# Patient Record
Sex: Male | Born: 1950 | Race: Black or African American | Hispanic: No | Marital: Married | Smoking: Never smoker
Health system: Southern US, Community
[De-identification: ages and names within clinical notes are randomized; demographics above are authoritative.]

## PROBLEM LIST (undated history)

## (undated) DIAGNOSIS — Z952 Presence of prosthetic heart valve: Secondary | ICD-10-CM

## (undated) DIAGNOSIS — D689 Coagulation defect, unspecified: Secondary | ICD-10-CM

## (undated) DIAGNOSIS — E785 Hyperlipidemia, unspecified: Secondary | ICD-10-CM

## (undated) DIAGNOSIS — J449 Chronic obstructive pulmonary disease, unspecified: Secondary | ICD-10-CM

## (undated) DIAGNOSIS — J45909 Unspecified asthma, uncomplicated: Secondary | ICD-10-CM

## (undated) DIAGNOSIS — T7840XA Allergy, unspecified, initial encounter: Secondary | ICD-10-CM

## (undated) DIAGNOSIS — I10 Essential (primary) hypertension: Secondary | ICD-10-CM

## (undated) DIAGNOSIS — F419 Anxiety disorder, unspecified: Secondary | ICD-10-CM

## (undated) DIAGNOSIS — I509 Heart failure, unspecified: Secondary | ICD-10-CM

## (undated) DIAGNOSIS — R011 Cardiac murmur, unspecified: Secondary | ICD-10-CM

## (undated) HISTORY — PX: TOOTH EXTRACTION: SUR596

## (undated) HISTORY — DX: Anxiety disorder, unspecified: F41.9

## (undated) HISTORY — DX: Coagulation defect, unspecified: D68.9

## (undated) HISTORY — DX: Cardiac murmur, unspecified: R01.1

## (undated) HISTORY — DX: Chronic obstructive pulmonary disease, unspecified: J44.9

## (undated) HISTORY — PX: ACNE CYST REMOVAL: SUR1112

## (undated) HISTORY — PX: FRACTURE SURGERY: SHX138

## (undated) HISTORY — DX: Allergy, unspecified, initial encounter: T78.40XA

## (undated) HISTORY — DX: Heart failure, unspecified: I50.9

## (undated) HISTORY — DX: Hyperlipidemia, unspecified: E78.5

## (undated) HISTORY — PX: CARDIAC VALVE REPLACEMENT: SHX585

## (undated) HISTORY — PX: OTHER SURGICAL HISTORY: SHX169

---

## 1996-03-06 HISTORY — PX: VALVE REPLACEMENT: SUR13

## 1997-06-02 ENCOUNTER — Encounter (HOSPITAL_COMMUNITY): Admission: RE | Admit: 1997-06-02 | Discharge: 1997-08-31 | Payer: Self-pay | Admitting: *Deleted

## 1997-09-08 ENCOUNTER — Encounter (HOSPITAL_COMMUNITY): Admission: RE | Admit: 1997-09-08 | Discharge: 1997-12-07 | Payer: Self-pay | Admitting: *Deleted

## 1998-02-11 ENCOUNTER — Encounter: Payer: Self-pay | Admitting: Emergency Medicine

## 1998-02-13 ENCOUNTER — Inpatient Hospital Stay (HOSPITAL_COMMUNITY): Admission: EM | Admit: 1998-02-13 | Discharge: 1998-02-15 | Payer: Self-pay | Admitting: Emergency Medicine

## 1998-03-18 ENCOUNTER — Ambulatory Visit (HOSPITAL_COMMUNITY): Admission: RE | Admit: 1998-03-18 | Discharge: 1998-03-18 | Payer: Self-pay | Admitting: *Deleted

## 1998-03-18 ENCOUNTER — Encounter: Payer: Self-pay | Admitting: Cardiology

## 1998-03-24 ENCOUNTER — Emergency Department (HOSPITAL_COMMUNITY): Admission: EM | Admit: 1998-03-24 | Discharge: 1998-03-24 | Payer: Self-pay | Admitting: Emergency Medicine

## 1998-03-26 ENCOUNTER — Ambulatory Visit: Admission: RE | Admit: 1998-03-26 | Discharge: 1998-03-26 | Payer: Self-pay | Admitting: *Deleted

## 1998-04-20 ENCOUNTER — Encounter (HOSPITAL_COMMUNITY): Admission: RE | Admit: 1998-04-20 | Discharge: 1998-07-19 | Payer: Self-pay | Admitting: *Deleted

## 1998-04-23 ENCOUNTER — Encounter: Admission: RE | Admit: 1998-04-23 | Discharge: 1998-07-22 | Payer: Self-pay | Admitting: *Deleted

## 1998-07-19 ENCOUNTER — Encounter (HOSPITAL_COMMUNITY): Admission: RE | Admit: 1998-07-19 | Discharge: 1998-10-17 | Payer: Self-pay | Admitting: *Deleted

## 1999-09-13 ENCOUNTER — Ambulatory Visit (HOSPITAL_COMMUNITY): Admission: RE | Admit: 1999-09-13 | Discharge: 1999-09-13 | Payer: Self-pay | Admitting: Internal Medicine

## 1999-09-13 ENCOUNTER — Encounter: Payer: Self-pay | Admitting: Internal Medicine

## 2003-02-27 ENCOUNTER — Emergency Department (HOSPITAL_COMMUNITY): Admission: EM | Admit: 2003-02-27 | Discharge: 2003-02-27 | Payer: Self-pay | Admitting: Emergency Medicine

## 2003-03-12 ENCOUNTER — Emergency Department (HOSPITAL_COMMUNITY): Admission: EM | Admit: 2003-03-12 | Discharge: 2003-03-13 | Payer: Self-pay | Admitting: Emergency Medicine

## 2004-01-19 ENCOUNTER — Ambulatory Visit: Payer: Self-pay | Admitting: *Deleted

## 2004-03-29 ENCOUNTER — Ambulatory Visit: Payer: Self-pay

## 2004-04-29 ENCOUNTER — Ambulatory Visit: Payer: Self-pay | Admitting: Cardiology

## 2004-05-12 ENCOUNTER — Ambulatory Visit: Payer: Self-pay

## 2004-05-12 ENCOUNTER — Ambulatory Visit: Payer: Self-pay | Admitting: Cardiology

## 2004-07-26 ENCOUNTER — Ambulatory Visit: Payer: Self-pay | Admitting: Internal Medicine

## 2004-09-09 ENCOUNTER — Ambulatory Visit: Payer: Self-pay | Admitting: Cardiology

## 2004-10-06 ENCOUNTER — Ambulatory Visit: Payer: Self-pay | Admitting: Internal Medicine

## 2004-11-03 ENCOUNTER — Ambulatory Visit: Payer: Self-pay | Admitting: Internal Medicine

## 2004-12-15 ENCOUNTER — Ambulatory Visit: Payer: Self-pay | Admitting: Cardiology

## 2004-12-22 ENCOUNTER — Ambulatory Visit: Payer: Self-pay | Admitting: Cardiology

## 2005-02-28 ENCOUNTER — Ambulatory Visit: Payer: Self-pay | Admitting: Cardiology

## 2005-03-16 ENCOUNTER — Ambulatory Visit: Payer: Self-pay | Admitting: Internal Medicine

## 2005-03-20 ENCOUNTER — Ambulatory Visit: Payer: Self-pay | Admitting: Internal Medicine

## 2005-04-13 ENCOUNTER — Ambulatory Visit: Payer: Self-pay | Admitting: *Deleted

## 2005-07-05 ENCOUNTER — Ambulatory Visit: Payer: Self-pay | Admitting: Cardiology

## 2005-07-17 ENCOUNTER — Ambulatory Visit: Payer: Self-pay | Admitting: Cardiology

## 2005-07-26 ENCOUNTER — Ambulatory Visit: Payer: Self-pay | Admitting: Cardiology

## 2005-09-05 ENCOUNTER — Ambulatory Visit: Payer: Self-pay | Admitting: Internal Medicine

## 2005-11-20 ENCOUNTER — Ambulatory Visit: Payer: Self-pay | Admitting: Cardiology

## 2006-01-31 ENCOUNTER — Ambulatory Visit: Payer: Self-pay | Admitting: Cardiovascular Disease

## 2006-03-14 ENCOUNTER — Ambulatory Visit: Payer: Self-pay | Admitting: Cardiology

## 2006-08-28 ENCOUNTER — Ambulatory Visit: Payer: Self-pay | Admitting: Cardiology

## 2006-08-29 ENCOUNTER — Ambulatory Visit: Payer: Self-pay | Admitting: Cardiology

## 2006-10-31 ENCOUNTER — Ambulatory Visit: Payer: Self-pay | Admitting: Internal Medicine

## 2006-11-12 ENCOUNTER — Ambulatory Visit: Payer: Self-pay | Admitting: Cardiology

## 2007-01-22 ENCOUNTER — Ambulatory Visit: Payer: Self-pay | Admitting: Cardiology

## 2007-03-14 ENCOUNTER — Telehealth: Payer: Self-pay | Admitting: Internal Medicine

## 2007-03-19 ENCOUNTER — Ambulatory Visit: Payer: Self-pay | Admitting: Cardiovascular Disease

## 2007-04-02 ENCOUNTER — Ambulatory Visit: Payer: Self-pay | Admitting: Cardiovascular Disease

## 2007-04-30 ENCOUNTER — Ambulatory Visit: Payer: Self-pay | Admitting: Cardiology

## 2007-05-28 ENCOUNTER — Ambulatory Visit: Payer: Self-pay | Admitting: Cardiovascular Disease

## 2007-06-03 DIAGNOSIS — J45909 Unspecified asthma, uncomplicated: Secondary | ICD-10-CM | POA: Insufficient documentation

## 2007-06-03 DIAGNOSIS — Z9889 Other specified postprocedural states: Secondary | ICD-10-CM

## 2007-06-03 DIAGNOSIS — I10 Essential (primary) hypertension: Secondary | ICD-10-CM | POA: Insufficient documentation

## 2007-06-03 DIAGNOSIS — T7840XA Allergy, unspecified, initial encounter: Secondary | ICD-10-CM | POA: Insufficient documentation

## 2007-06-03 DIAGNOSIS — E785 Hyperlipidemia, unspecified: Secondary | ICD-10-CM | POA: Insufficient documentation

## 2007-06-03 DIAGNOSIS — F411 Generalized anxiety disorder: Secondary | ICD-10-CM | POA: Insufficient documentation

## 2007-06-03 DIAGNOSIS — Z8669 Personal history of other diseases of the nervous system and sense organs: Secondary | ICD-10-CM | POA: Insufficient documentation

## 2007-06-25 ENCOUNTER — Ambulatory Visit: Payer: Self-pay | Admitting: Cardiology

## 2007-10-28 ENCOUNTER — Ambulatory Visit: Payer: Self-pay | Admitting: Internal Medicine

## 2008-02-20 ENCOUNTER — Ambulatory Visit: Payer: Self-pay | Admitting: Cardiology

## 2008-05-18 ENCOUNTER — Ambulatory Visit: Payer: Self-pay | Admitting: Cardiovascular Disease

## 2008-06-18 ENCOUNTER — Ambulatory Visit: Payer: Self-pay | Admitting: Internal Medicine

## 2008-06-25 ENCOUNTER — Ambulatory Visit: Payer: Self-pay | Admitting: Internal Medicine

## 2008-07-08 ENCOUNTER — Ambulatory Visit: Payer: Self-pay | Admitting: Cardiovascular Disease

## 2008-07-20 ENCOUNTER — Encounter: Payer: Self-pay | Admitting: Cardiology

## 2008-07-22 ENCOUNTER — Ambulatory Visit: Payer: Self-pay | Admitting: Cardiology

## 2008-07-29 ENCOUNTER — Ambulatory Visit: Payer: Self-pay | Admitting: Internal Medicine

## 2008-08-04 ENCOUNTER — Encounter: Payer: Self-pay | Admitting: *Deleted

## 2008-08-12 ENCOUNTER — Ambulatory Visit: Payer: Self-pay | Admitting: Cardiology

## 2008-08-12 LAB — CONVERTED CEMR LAB
POC INR: 2.1
Protime: 17.8

## 2008-08-19 ENCOUNTER — Ambulatory Visit: Payer: Self-pay | Admitting: Cardiology

## 2008-08-19 ENCOUNTER — Encounter: Payer: Self-pay | Admitting: Cardiology

## 2008-08-26 ENCOUNTER — Ambulatory Visit: Payer: Self-pay | Admitting: Internal Medicine

## 2008-08-26 LAB — CONVERTED CEMR LAB
POC INR: 1.9
Prothrombin Time: 17.1 s

## 2008-09-09 ENCOUNTER — Ambulatory Visit: Payer: Self-pay | Admitting: Internal Medicine

## 2008-09-09 ENCOUNTER — Encounter: Payer: Self-pay | Admitting: *Deleted

## 2008-09-09 LAB — CONVERTED CEMR LAB
POC INR: 3.6
Prothrombin Time: 22.9 s

## 2008-09-30 ENCOUNTER — Ambulatory Visit: Payer: Self-pay | Admitting: Cardiology

## 2008-09-30 LAB — CONVERTED CEMR LAB
POC INR: 1.8
Prothrombin Time: 16.5 s

## 2008-10-14 ENCOUNTER — Ambulatory Visit: Payer: Self-pay | Admitting: Internal Medicine

## 2008-11-11 ENCOUNTER — Ambulatory Visit: Payer: Self-pay | Admitting: Cardiology

## 2008-11-11 LAB — CONVERTED CEMR LAB: POC INR: 3.5

## 2008-12-02 ENCOUNTER — Ambulatory Visit: Payer: Self-pay | Admitting: Cardiovascular Disease

## 2008-12-11 ENCOUNTER — Telehealth: Payer: Self-pay | Admitting: Cardiology

## 2008-12-30 ENCOUNTER — Ambulatory Visit: Payer: Self-pay | Admitting: Cardiology

## 2009-02-05 ENCOUNTER — Encounter (INDEPENDENT_AMBULATORY_CARE_PROVIDER_SITE_OTHER): Payer: Self-pay | Admitting: Cardiology

## 2009-02-05 ENCOUNTER — Ambulatory Visit: Payer: Self-pay | Admitting: Cardiovascular Disease

## 2009-02-05 LAB — CONVERTED CEMR LAB: POC INR: 3.3

## 2009-03-24 ENCOUNTER — Ambulatory Visit: Payer: Self-pay | Admitting: Cardiovascular Disease

## 2009-03-24 LAB — CONVERTED CEMR LAB: POC INR: 2.1

## 2009-04-22 ENCOUNTER — Ambulatory Visit: Payer: Self-pay | Admitting: Cardiology

## 2009-04-22 LAB — CONVERTED CEMR LAB: POC INR: 2.4

## 2009-05-20 ENCOUNTER — Ambulatory Visit: Payer: Self-pay | Admitting: Internal Medicine

## 2009-05-20 LAB — CONVERTED CEMR LAB: POC INR: 3.4

## 2009-06-24 ENCOUNTER — Ambulatory Visit: Payer: Self-pay | Admitting: Cardiology

## 2009-06-24 LAB — CONVERTED CEMR LAB: POC INR: 2.6

## 2009-07-22 ENCOUNTER — Ambulatory Visit: Payer: Self-pay | Admitting: Internal Medicine

## 2009-07-22 LAB — CONVERTED CEMR LAB: POC INR: 2.5

## 2009-07-30 ENCOUNTER — Telehealth: Payer: Self-pay | Admitting: Cardiology

## 2009-10-08 ENCOUNTER — Ambulatory Visit: Payer: Self-pay | Admitting: Cardiovascular Disease

## 2009-11-02 ENCOUNTER — Telehealth: Payer: Self-pay | Admitting: Cardiology

## 2009-11-26 ENCOUNTER — Encounter (INDEPENDENT_AMBULATORY_CARE_PROVIDER_SITE_OTHER): Payer: Self-pay | Admitting: *Deleted

## 2009-12-02 ENCOUNTER — Ambulatory Visit: Payer: Self-pay | Admitting: Cardiology

## 2010-02-04 ENCOUNTER — Ambulatory Visit: Payer: Self-pay | Admitting: Cardiovascular Disease

## 2010-02-11 ENCOUNTER — Ambulatory Visit: Payer: Self-pay | Admitting: Cardiology

## 2010-02-11 ENCOUNTER — Encounter: Payer: Self-pay | Admitting: Cardiology

## 2010-02-11 DIAGNOSIS — R634 Abnormal weight loss: Secondary | ICD-10-CM | POA: Insufficient documentation

## 2010-02-21 ENCOUNTER — Ambulatory Visit: Payer: Self-pay

## 2010-02-25 ENCOUNTER — Ambulatory Visit: Payer: Self-pay | Admitting: Cardiology

## 2010-02-25 ENCOUNTER — Ambulatory Visit (HOSPITAL_COMMUNITY)
Admission: RE | Admit: 2010-02-25 | Discharge: 2010-02-25 | Payer: Self-pay | Source: Home / Self Care | Attending: Cardiology | Admitting: Cardiology

## 2010-02-25 ENCOUNTER — Encounter: Payer: Self-pay | Admitting: Cardiology

## 2010-03-15 ENCOUNTER — Ambulatory Visit: Admit: 2010-03-15 | Payer: Self-pay | Admitting: Cardiology

## 2010-03-18 ENCOUNTER — Ambulatory Visit: Admit: 2010-03-18 | Payer: Self-pay

## 2010-04-05 NOTE — Medication Information (Signed)
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 no    Recent/future dental: no  Any missed doses?: no       Is patient compliant with meds? yes       Current Medications (verified): 1)  Lisinopril-Hydrochlorothiazide 20-12.5 Mg Tabs (Lisinopril-Hydrochlorothiazide) .... One Tablet By Mouth Once Daily 2)  Pravachol 40 Mg Tabs (Pravastatin Sodium) .... One Tablet By Mouth At Bedtime 3)  Metoprolol Tartrate 50 Mg Tabs (Metoprolol Tartrate) .... Take 1 Tablet By Mouth Two Times A Day 4)  Warfarin Sodium 5 Mg Tabs (Warfarin Sodium) .... Take As Directed By Coumadin Clinic 5)  Diltiazem Hcl Er Beads 240 Mg Xr24h-Cap (Diltiazem Hcl Er Beads) .... Take One Capsule By Mouth Daily 6)  Aspirin 81 Mg Tbec (Aspirin) .... Take One Tablet By Mouth Daily 7)  Multivitamins  Tabs (Multiple Vitamin) .... Take 1 Tablet By Mouth Once A Day 8)  Advair Diskus 100-50 Mcg/dose Aepb (Fluticasone-Salmeterol) .... Inhale 1 Puff in The Morning and Evening.  Allergies (verified): 1)  ! Altace (Ramipril) 2)  ! Propranolol Hcl (Propranolol Hcl) 3)  ! Codeine Sulfate (Codeine Sulfate) 4)  ! Vytorin (Ezetimibe-Simvastatin) 5)  ! Coreg (Carvedilol)  Anticoagulation Management History:      The patient is taking warfarin and comes in today for a routine follow up visit.  Negative risk factors for bleeding include an age less than 8 years old.  The bleeding index is 'low risk'.  Positive CHADS2 values include History of HTN.  Negative CHADS2 values include Age > 61  years old.  The start date was 03/06/1997.  Anticoagulation responsible provider: Ladona Ridgel MD, Sharlot Gowda.  INR POC: 2.5.  Exp: 08/2010.    Anticoagulation Management Assessment/Plan:      The patient's current anticoagulation dose is Warfarin sodium 5 mg tabs: take as directed by coumadin clinic.  The target INR is 2.5-3.5.  The next INR is due 08/27/2009.  Anticoagulation instructions were given to patient.  Results were reviewed/authorized by Eda Keys, PharmD.  He was notified by Eda Keys, PharmD.         Prior Anticoagulation Instructions: INR 2.6 Continue 10mg  daily except 7.5mg s on Sundays. Recheck in 4 weeks.   Current Anticoagulation Instructions: INR 2.5  Take 2.5 tablets today, then return to normal dosing schedule of 1.5 tablets on Sunday and 2 tablets all other days.  Return to clinic in 4 weeks.

## 2010-04-05 NOTE — Letter (Signed)
Summary: Appointment - Missed  [facility], Main Office  [address]   [place], [place] [address]   Phone: [phone]  Fax: [phone]     November 26, 2009 MRN: [id number]   [facility] [address] [place], [place]  [address]   Dear [name],  Our records indicate you missed your appointment on 11-09-2009 with  Dr.  Jens Som .It is very important that we reach you to reschedule this appointment. We look forward to participating in your health care needs. Please contact us at the number listed above at your earliest convenience to reschedule this appointment.     Sincerely,      Lorne Skeens  [facility] Scheduling Team

## 2010-04-05 NOTE — Progress Notes (Signed)
  Phone Note Refill Request Message from:  Patient on November 02, 2009 10:04 AM  Refills Requested: Medication #1:  WARFARIN SODIUM 5 MG TABS take as directed by coumadin clinic [facility]   Method Requested: Telephone to Pharmacy Initial call taken by: Glynda Jaeger,  November 02, 2009 10:04 AM    Prescriptions: WARFARIN SODIUM 5 MG TABS (WARFARIN SODIUM) take as directed by coumadin clinic  #60 x 1   Entered by:   Cloyde Reams RN   Authorized by:   Ferman Hamming, MD, [facility]   Signed by:   Cloyde Reams RN on 11/02/2009   Method used:   Electronically to        [facility] (retail)       [address]       [place], [place]  [address]       Ph: [phone]       Fax: [phone]   RxID:   [id number]

## 2010-04-05 NOTE — Medication Information (Signed)
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 yes       Details: was on abx and steroids about 2 weeks ago for PNA  Recent/future dental: no  Any missed doses?: no       Is patient compliant with meds? yes       Allergies: 1)  ! Altace (Ramipril) 2)  ! Propranolol Hcl (Propranolol Hcl) 3)  ! Codeine Sulfate (Codeine Sulfate) 4)  ! Vytorin (Ezetimibe-Simvastatin) 5)  ! Coreg (Carvedilol)  Anticoagulation Management History:      The patient is taking warfarin and comes in today for a routine follow up visit.  Negative risk factors for bleeding include an age less than 24 years old.  The bleeding index is 'low risk'.  Positive CHADS2 values include History of HTN.  Negative CHADS2 values include Age > 78 years old.  The start date was 03/06/1997.  Anticoagulation responsible provider: Excell Seltzer MD, Casimiro Needle.  INR POC: 2.4.  Cuvette Lot#: [id number].  Exp: 02/2011.    Anticoagulation Management Assessment/Plan:      The patient's current anticoagulation dose is Warfarin sodium 5 mg tabs: take as directed by coumadin clinic.  The target INR is 2.5-3.5.  The next INR is due 02/25/2010.  Anticoagulation instructions were given to patient.  Results were reviewed/authorized by Weston Brass, PharmD.  He was notified by Weston Brass PharmD.         Prior Anticoagulation Instructions: INR 2.3  Coumadin 10mg  = 2 tabs each day  Current Anticoagulation Instructions: INR 2.4  Take 2 1/2 tablets today then resume same dose of 2 tablets every day.  Recheck INR in 3  weeks.

## 2010-04-05 NOTE — Medication Information (Signed)
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 no    Recent/future dental: no  Any missed doses?: no       Is patient compliant with meds? yes       Allergies: 1)  ! Altace (Ramipril) 2)  ! Propranolol Hcl (Propranolol Hcl) 3)  ! Codeine Sulfate (Codeine Sulfate) 4)  ! Vytorin (Ezetimibe-Simvastatin) 5)  ! Coreg (Carvedilol)  Anticoagulation Management History:      The patient is taking warfarin and comes in today for a routine follow up visit.  Negative risk factors for bleeding include an age less than 94 years old.  The bleeding index is 'low risk'.  Positive CHADS2 values include History of HTN.  Negative CHADS2 values include Age > 22 years old.  The start date was 03/06/1997.  Anticoagulation responsible provider: Eden Emms MD, Theron Arista.  INR POC: 2.1.  Cuvette Lot#: [id number].  Exp: 06/2010.    Anticoagulation Management Assessment/Plan:      The patient's current anticoagulation dose is Warfarin sodium 5 mg tabs: take as directed by coumadin clinic.  The target INR is 2.5-3.5.  The next INR is due 04/14/2009.  Anticoagulation instructions were given to patient.  Results were reviewed/authorized by Bethena Midget, RN, BSN.  He was notified by Bethena Midget, RN, BSN.         Prior Anticoagulation Instructions: INR 3.3  Continue 1.5 tabs on Sundays and Thursdays. Continue 2 tabs on all other days.   Recheck in 4 weeks.   Current Anticoagulation Instructions: INR 2.1 Today take 15mg s then continue 10mg s daily except 7.5mg s on Sundays and  Thursdays. Recheck in 3 weeks.

## 2010-04-05 NOTE — Medication Information (Signed)
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 no    Recent/future dental: no  Any missed doses?: no       Is patient compliant with meds? yes       Allergies: 1)  ! Altace (Ramipril) 2)  ! Propranolol Hcl (Propranolol Hcl) 3)  ! Codeine Sulfate (Codeine Sulfate) 4)  ! Vytorin (Ezetimibe-Simvastatin) 5)  ! Coreg (Carvedilol)  Anticoagulation Management History:      The patient is taking warfarin and comes in today for a routine follow up visit.  Negative risk factors for bleeding include an age less than 58 years old.  The bleeding index is 'low risk'.  Positive CHADS2 values include History of HTN.  Negative CHADS2 values include Age > 18 years old.  The start date was 03/06/1997.  Anticoagulation responsible provider: Myrtis Ser MD, Tinnie Gens.  INR POC: 2.4.  Cuvette Lot#: [id number].  Exp: 06/2010.    Anticoagulation Management Assessment/Plan:      The patient's current anticoagulation dose is Warfarin sodium 5 mg tabs: take as directed by coumadin clinic.  The target INR is 2.5-3.5.  The next INR is due 05/13/2009.  Anticoagulation instructions were given to patient.  Results were reviewed/authorized by Bethena Midget, RN, BSN.  He was notified by Bethena Midget, RN, BSN.         Prior Anticoagulation Instructions: INR 2.1 Today take 15mg s then continue 10mg s daily except 7.5mg s on Sundays and Thursdays. Recheck in 3 weeks.  Current Anticoagulation Instructions: INR 2.4 Today 10mg s then change dose to 10mg s daily except 7.5mg s on Sundays. Recheck in 3  weeks.

## 2010-04-05 NOTE — Medication Information (Signed)
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 yes       Details: PRN use of Advil Sinus, another suggested made by Pharm D  Recent/future dental: no  Any missed doses?: no       Is patient compliant with meds? yes       Allergies: 1)  ! Altace (Ramipril) 2)  ! Propranolol Hcl (Propranolol Hcl) 3)  ! Codeine Sulfate (Codeine Sulfate) 4)  ! Vytorin (Ezetimibe-Simvastatin) 5)  ! Coreg (Carvedilol)  Anticoagulation Management History:      The patient is taking warfarin and comes in today for a routine follow up visit.  Negative risk factors for bleeding include an age less than 48 years old.  The bleeding index is 'low risk'.  Positive CHADS2 values include History of HTN.  Negative CHADS2 values include Age > 56 years old.  The start date was 03/06/1997.  Anticoagulation responsible provider: Myrtis Ser MD, Tinnie Gens.  INR POC: 2.6.  Cuvette Lot#: [id number].  Exp: 08/2010.    Anticoagulation Management Assessment/Plan:      The patient's current anticoagulation dose is Warfarin sodium 5 mg tabs: take as directed by coumadin clinic.  The target INR is 2.5-3.5.  The next INR is due 07/22/2009.  Anticoagulation instructions were given to patient.  Results were reviewed/authorized by Bethena Midget, RN, BSN.  He was notified by Bethena Midget, RN, BSN.         Prior Anticoagulation Instructions: INR 3.4  Continue on same dosage 2 tablets daily except 1.5 tablets on Sundays.  Recheck in 4 weeks.    Current Anticoagulation Instructions: INR 2.6 Continue 10mg  daily except 7.5mgs on Sundays. Recheck in 4 weeks.  Prescriptions: WARFARIN SODIUM 5 MG TABS (WARFARIN SODIUM) take as directed by coumadin clinic  #60 x 3   Entered by:   Tiffany Muse, RN, BSN   Authorized by:   Brian Saunders Crenshaw, MD, FACC   Signed by:   Tiffany Muse, RN, BSN on 06/24/2009   Method used:   Electronically to        [facility] (retail)       [address]       [place], [place]  [address]       Ph: [phone]       Fax: [phone]   RxID:   [id number]

## 2010-04-05 NOTE — Medication Information (Signed)
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 no    Recent/future dental: no  Any missed doses?: no       Is patient compliant with meds? yes       Allergies: 1)  ! Altace (Ramipril) 2)  ! Propranolol Hcl (Propranolol Hcl) 3)  ! Codeine Sulfate (Codeine Sulfate) 4)  ! Vytorin (Ezetimibe-Simvastatin) 5)  ! Coreg (Carvedilol)  Anticoagulation Management History:      The patient is taking warfarin and comes in today for a routine follow up visit.  Negative risk factors for bleeding include an age less than 78 years old.  The bleeding index is 'low risk'.  Positive CHADS2 values include History of HTN.  Negative CHADS2 values include Age > 66 years old.  The start date was 03/06/1997.  Anticoagulation responsible provider: Eden Emms MD, Theron Arista.  INR POC: 2.1.  Cuvette Lot#: [id number].  Exp: 12/2010.    Anticoagulation Management Assessment/Plan:      The patient's current anticoagulation dose is Warfarin sodium 5 mg tabs: take as directed by coumadin clinic.  The target INR is 2.5-3.5.  The next INR is due 11/04/2009.  Anticoagulation instructions were given to patient.  Results were reviewed/authorized by Cloyde Reams, RN, BSN.  He was notified by Cloyde Reams RN.         Prior Anticoagulation Instructions: INR 2.5  Take 2.5 tablets today, then return to normal dosing schedule of 1.5 tablets on Sunday and 2 tablets all other days.  Return to clinic in 4 weeks.    Current Anticoagulation Instructions: INR 2.1  Take 3 tablets today, then resume  same dosage 2 tablets daily except 1.5 tablets on Sundays.  Recheck in 4 weeks.

## 2010-04-05 NOTE — Medication Information (Signed)
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 no    Recent/future dental: no  Any missed doses?: no       Is patient compliant with meds? yes       Allergies: 1)  ! Altace (Ramipril) 2)  ! Propranolol Hcl (Propranolol Hcl) 3)  ! Codeine Sulfate (Codeine Sulfate) 4)  ! Vytorin (Ezetimibe-Simvastatin) 5)  ! Coreg (Carvedilol)  Anticoagulation Management History:      The patient is taking warfarin and comes in today for a routine follow up visit.  Negative risk factors for bleeding include an age less than 65 years old.  The bleeding index is 'low risk'.  Positive CHADS2 values include History of HTN.  Negative CHADS2 values include Age > 24 years old.  The start date was 03/06/1997.  Anticoagulation responsible provider: Myrtis Ser MD, Tinnie Gens.  INR POC: 3.4.  Cuvette Lot#: [id number].  Exp: 07/2010.    Anticoagulation Management Assessment/Plan:      The patient's current anticoagulation dose is Warfarin sodium 5 mg tabs: take as directed by coumadin clinic.  The target INR is 2.5-3.5.  The next INR is due 06/17/2009.  Anticoagulation instructions were given to patient.  Results were reviewed/authorized by Cloyde Reams, RN, BSN.  He was notified by Cloyde Reams RN.         Prior Anticoagulation Instructions: INR 2.4 Today 10mg s then change dose to 10mg s daily except 7.5mg s on Sundays. Recheck in 3 weeks.   Current Anticoagulation Instructions: INR 3.4  Continue on same dosage 2 tablets daily except 1.5 tablets on Sundays.  Recheck in 4 weeks.

## 2010-04-05 NOTE — Progress Notes (Signed)
Summary: refill pt is out of medication  Phone Note Refill Request Message from:  Patient  Refills Requested: Medication #1:  ADVAIR DISKUS 100-50 MCG/DOSE AEPB Inhale 1 puff in the morning and evening.. send to [facility]   Initial call taken by: Judie Grieve,  Jul 30, 2009 3:11 PM  Follow-up for Phone Call        called pt. Pt didnot answer. Will call agian to let pt know to ask this primary care physican for this med Follow-up by: Kem Parkinson,  August 04, 2009 10:03 AM

## 2010-04-05 NOTE — Medication Information (Signed)
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 no    Recent/future dental: no  Any missed doses?: no       Is patient compliant with meds? yes       Current Medications (verified): 1)  Lisinopril-Hydrochlorothiazide 20-12.5 Mg Tabs (Lisinopril-Hydrochlorothiazide) .... One Tablet By Mouth Once Daily 2)  Pravachol 40 Mg Tabs (Pravastatin Sodium) .... One Tablet By Mouth At Bedtime 3)  Metoprolol Tartrate 50 Mg Tabs (Metoprolol Tartrate) .... Take 1 Tablet By Mouth Two Times A Day 4)  Warfarin Sodium 5 Mg Tabs (Warfarin Sodium) .... Take As Directed By Coumadin Clinic 5)  Diltiazem Hcl Er Beads 240 Mg Xr24h-Cap (Diltiazem Hcl Er Beads) .... Take One Capsule By Mouth Daily 6)  Aspirin 81 Mg Tbec (Aspirin) .... Take One Tablet By Mouth Daily 7)  Multivitamins  Tabs (Multiple Vitamin) .... Take 1 Tablet By Mouth Once A Day 8)  Advair Diskus 100-50 Mcg/dose Aepb (Fluticasone-Salmeterol) .... Inhale 1 Puff in The Morning and Evening.  Allergies: 1)  ! Altace (Ramipril) 2)  ! Propranolol Hcl (Propranolol Hcl) 3)  ! Codeine Sulfate (Codeine Sulfate) 4)  ! Vytorin (Ezetimibe-Simvastatin) 5)  ! Coreg (Carvedilol)  Anticoagulation Management History:      Negative risk factors for bleeding include an age less than 70 years old.  The bleeding index is 'low risk'.  Positive CHADS2 values include History of HTN.  Negative CHADS2 values include Age > 29 years old.  The start date was 03/06/1997.  Anticoagulation responsible Hibba Schram: Shirlee Latch MD,  D[place].  INR POC: 2.3.  Exp: 12/2010.    Anticoagulation Management Assessment/Plan:      The patient's current anticoagulation dose is Warfarin sodium 5 mg tabs: take as directed by coumadin clinic.  The target INR is 2.5-3.5.  The next INR is due 12/30/2009.  Anticoagulation instructions were given to patient.  Results were reviewed/authorized by Leota Sauers, PharmD, BCPS, CPP.         Prior Anticoagulation Instructions: INR 2.1  Take 3 tablets today, then resume same dosage 2 tablets daily except 1.5 tablets on Sundays.  Recheck in 4 weeks.    Current Anticoagulation Instructions: INR 2.3  Coumadin 10mg = 2 tabs each day Prescriptions: WARFARIN SODIUM 5 MG TABS (WARFARIN SODIUM) take as directed by coumadin clinic  #90 x 1   Entered by:   Erika Johnson RN   Authorized by:   Thomas C Wall, MD, FACC   Signed by:   Erika Johnson RN on 12/02/2009   Method used:   Electronically to        [facility] (retail)       [address]       [place], [place]  [address]       Ph: [phone]       Fax: [phone]   RxID:   [id number]

## 2010-04-05 NOTE — Assessment & Plan Note (Signed)
Summary: rov/ gd   CC:  check up.  History of Present Illness: [name] is a very pleasant gentleman who is status post mitral valve replacement with a St. Jude valve in 1998. A pre-operative cardiac catheterization revealed normal coronary arteries. The last echocardiogram in the chart is from March of 2006 and the ejection fraction was 45-50% the mitral valve was well seated. The left atrium was mildly dilated. There is mild tricuspid regurgitation.I last saw him in June of 2010. Since I last saw him he denies any dyspnea on exertion, orthopnea, PND, pedal edema, palpitations or syncope. There is no chest pain. No bleeding.  Preventive Screening-Counseling & Management  Alcohol-Tobacco     Smoking Status: never  Current Medications (verified): 1)  Lisinopril-Hydrochlorothiazide 20-12.5 Mg Tabs (Lisinopril-Hydrochlorothiazide) .... One Tablet By Mouth Once Daily 2)  Metoprolol Tartrate 50 Mg Tabs (Metoprolol Tartrate) .... Take 1 Tablet By Mouth Two Times A Day 3)  Warfarin Sodium 5 Mg Tabs (Warfarin Sodium) .... Take As Directed By Coumadin Clinic 4)  Diltiazem Hcl Er Beads 240 Mg Xr24h-Cap (Diltiazem Hcl Er Beads) .... Take One Capsule By Mouth Daily 5)  Aspirin 81 Mg Tbec (Aspirin) .... Take One Tablet By Mouth Daily 6)  Multivitamins  Tabs (Multiple Vitamin) .... Take 1 Tablet By Mouth Once A Day 7)  Advair Diskus 100-50 Mcg/dose Aepb (Fluticasone-Salmeterol) .... Inhale 1 Puff in The Morning and Evening.  Allergies: 1)  ! Altace (Ramipril) 2)  ! Propranolol Hcl (Propranolol Hcl) 3)  ! Codeine Sulfate (Codeine Sulfate) 4)  ! Vytorin (Ezetimibe-Simvastatin) 5)  ! Coreg (Carvedilol)  Past History:  Past Medical History: LABYRINTHITIS, HX OF (ICD-V12.40) ASTHMA (ICD-493.90) ANXIETY (ICD-300.00) ALLERGY (ICD-995.3) MITRAL VALVE REPLACEMENT, HX OF (ICD-V15.1) DYSLIPIDEMIA (ICD-272.4) HYPERTENSION (ICD-401.9)  Past Surgical History: Jaw surgery Cyst removal from hand and  back Valve Replacement-Mitral (S/P)St. Jude in 1998  Social History: He is a Charity fundraiser. Married live with his wife , one son and one daugther Alcohol Use - no Drug Use - no Tobacco Use - No.  Smoking Status:  never  Review of Systems       Describes weight loss of 30 pounds but no fevers or chills, productive cough, hemoptysis, dysphasia, odynophagia, melena, hematochezia, dysuria, hematuria, rash, seizure activity, orthopnea, PND, pedal edema, claudication. Remaining systems are negative.   Vital Signs:  Patient profile:   60 year old male Height:      76 inches Weight:      183 pounds BMI:     22.36 Pulse rate:   73 / minute Resp:     16 per minute BP sitting:   132 / 80  (left arm)  Vitals Entered By: Kem Parkinson (February 11, 2010 12:36 PM)  Physical Exam  General:  Well-developed well-nourished in no acute distress.  Skin is warm and dry.  HEENT is normal.  Neck is supple. No thyromegaly.  Chest is clear to auscultation with normal expansion.  Cardiovascular exam is regular rate and rhythm. Crisp mechanical valve. Abdominal exam nontender or distended. No masses palpated. Extremities show no edema. neuro grossly intact    EKG  Procedure date:  02/11/2010  Findings:      Sinus rhythm with occasional PAC.  Impression & Recommendations:  Problem # 1:  COUMADIN THERAPY (ICD-V58.61) Monitored in the Coumadin clinic. Check CBC.  Problem # 2:  MITRAL VALVE REPLACEMENT, HX OF (ICD-V15.1) Continue SBE prophylaxis. Check echocardiogram. Orders: Echocardiogram (Echo)  Problem # 3:  DYSLIPIDEMIA (ICD-272.4)  Patient did not tolerate Pravachol. Begin generic Lipitor 20 mg p.o. daily and check lipids and liver in 4 weeks. The following medications were removed from the medication list:    Pravachol 40 Mg Tabs (Pravastatin sodium) ..... One tablet by mouth at bedtime His updated medication list for this problem includes:    Lipitor 20 Mg Tabs  (Atorvastatin calcium) .Marland Kitchen... 1 tab at bedtime  The following medications were removed from the medication list:    Pravachol 40 Mg Tabs (Pravastatin sodium) ..... One tablet by mouth at bedtime His updated medication list for this problem includes:    Lipitor 20 Mg Tabs (Atorvastatin calcium) .Marland Kitchen... 1 tab at bedtime  Problem # 4:  HYPERTENSION (ICD-401.9)  Blood pressure controlled. Continue present medications. Check potassium and renal function. His updated medication list for this problem includes:    Lisinopril-hydrochlorothiazide 20-12.5 Mg Tabs (Lisinopril-hydrochlorothiazide) ..... One tablet by mouth once daily    Metoprolol Tartrate 50 Mg Tabs (Metoprolol tartrate) .Marland Kitchen... Take 1 tablet by mouth two times a day    Diltiazem Hcl Er Beads 240 Mg Xr24h-cap (Diltiazem hcl er beads) .Marland Kitchen... Take one capsule by mouth daily    Aspirin 81 Mg Tbec (Aspirin) .Marland Kitchen... Take one tablet by mouth daily  His updated medication list for this problem includes:    Lisinopril-hydrochlorothiazide 20-12.5 Mg Tabs (Lisinopril-hydrochlorothiazide) ..... One tablet by mouth once daily    Metoprolol Tartrate 50 Mg Tabs (Metoprolol tartrate) .Marland Kitchen... Take 1 tablet by mouth two times a day    Diltiazem Hcl Er Beads 240 Mg Xr24h-cap (Diltiazem hcl er beads) .Marland Kitchen... Take one capsule by mouth daily    Aspirin 81 Mg Tbec (Aspirin) .Marland Kitchen... Take one tablet by mouth daily  Problem # 5:  WEIGHT LOSS (ICD-783.21) Patient feels this is most likely secondary to his diet. Check TSH, CBC. Followup with his primary care.  Patient Instructions: 1)  Your physician has recommended you make the following change in your medication: START ATORVASTATIN (LIPITOR) 20 MG 1 TABLET EVERY NIGHT. 2)  Your physician wants you to follow-up in:  1 YEAR WITH DR. CRENSHAW. You will receive a reminder letter in the mail two months in advance. If you don't receive a letter, please call our office to schedule the follow-up appointment. 3)  Your physician  has requested that you have an echocardiogram.  Echocardiography is a painless test that uses sound waves to create images of your heart. It provides your doctor with information about the size and shape of your heart and how well your heart's chambers and valves are working.  This procedure takes approximately one hour. There are no restrictions for this procedure. 4)  Your physician recommends that you return for lab work in: 4 WEEKS FOR LIVER/LIPID, BMET AND CBC Prescriptions: LIPITOR 20 MG TABS (ATORVASTATIN CALCIUM) 1 tab at bedtime  #30 x 11   Entered by:   Danielle Rankin, CMA   Authorized by:   Ferman Hamming, MD, [facility]   Signed by:   Danielle Rankin, CMA on 02/11/2010   Method used:   Electronically to        [facility] (retail)       [address]       [place], [place]  [address]       Ph: [phone]       Fax: [phone]   RxID:   [id number]

## 2010-04-07 NOTE — Medication Information (Signed)
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 yes       Details: Restarted Lipitor   Any missed doses?: no       Is patient compliant with meds? yes       Allergies: 1)  ! Altace (Ramipril) 2)  ! Propranolol Hcl (Propranolol Hcl) 3)  ! Codeine Sulfate (Codeine Sulfate) 4)  ! Vytorin (Ezetimibe-Simvastatin) 5)  ! Coreg (Carvedilol)  Anticoagulation Management History:      The patient is taking warfarin and comes in today for a routine follow up visit.  Negative risk factors for bleeding include an age less than 51 years old.  The bleeding index is 'low risk'.  Positive CHADS2 values include History of HTN.  Negative CHADS2 values include Age > 60 years old.  The start date was 03/06/1997.  Anticoagulation responsible provider: Excell Seltzer MD, Casimiro Needle.  INR POC: 2.2.  Cuvette Lot#: [id number].  Exp: 03/2011.    Anticoagulation Management Assessment/Plan:      The patient's current anticoagulation dose is Warfarin sodium 5 mg tabs: take as directed by coumadin clinic.  The target INR is 2.5-3.5.  The next INR is due 03/18/2010.  Anticoagulation instructions were given to patient.  Results were reviewed/authorized by Bethena Midget, RN, BSN.  He was notified by Bethena Midget, RN, BSN.         Prior Anticoagulation Instructions: INR 2.4  Take 2 1/2 tablets today then resume same dose of 2 tablets every day.  Recheck INR in 3 weeks.   Current Anticoagulation Instructions: INR 2.2 Today take 12.5mg s then change dose to 10mg s daily except 12.5mg s on Mondays. Reheck in 3 weeks.  Prescriptions: WARFARIN SODIUM 5 MG TABS (WARFARIN SODIUM) take as directed by coumadin  clinic  #90 x 3   Entered by:   Bethena Midget, RN, BSN   Authorized by:   Ferman Hamming, MD, [facility]   Signed by:   Bethena Midget, RN, BSN on 02/25/2010   Method used:   Electronically to        [facility] (retail)       [address]       [place], [place]  [address]       Ph: [phone]       Fax: [phone]   RxID:   [id number]

## 2010-04-14 DIAGNOSIS — I059 Rheumatic mitral valve disease, unspecified: Secondary | ICD-10-CM | POA: Insufficient documentation

## 2010-04-14 DIAGNOSIS — Z9889 Other specified postprocedural states: Secondary | ICD-10-CM

## 2010-05-31 ENCOUNTER — Other Ambulatory Visit: Payer: Self-pay | Admitting: Cardiology

## 2010-06-02 NOTE — Telephone Encounter (Signed)
[address] °

## 2010-07-08 ENCOUNTER — Ambulatory Visit (INDEPENDENT_AMBULATORY_CARE_PROVIDER_SITE_OTHER): Payer: Self-pay | Admitting: *Deleted

## 2010-07-08 ENCOUNTER — Other Ambulatory Visit: Payer: Self-pay | Admitting: *Deleted

## 2010-07-08 DIAGNOSIS — Z9889 Other specified postprocedural states: Secondary | ICD-10-CM

## 2010-07-08 DIAGNOSIS — I059 Rheumatic mitral valve disease, unspecified: Secondary | ICD-10-CM

## 2010-07-08 LAB — POCT INR: INR: 3.1

## 2010-07-08 MED ORDER — DILTIAZEM HCL ER COATED BEADS 240 MG PO CP24: 240.0000 mg | ORAL_CAPSULE | Freq: Every day | ORAL | Status: DC

## 2010-07-19 NOTE — Assessment & Plan Note (Signed)
[facility]                            CARDIOLOGY OFFICE NOTE   OLUWASEMILORE, BAHL                      MRN:          [id number]  DATE:08/29/2006                            DOB:          13-Jul-1950    Corey Mathis is a very pleasant gentleman who is status post mitral valve  replacement with a St. Jude valve in 1998.  Since I last saw him, he  denies any dyspnea, chest pain, palpitations, or syncope.  There is no  pedal edema.  Note, he has run out of several of his medications in the  past week and has not taken them.   CURRENT MEDICATIONS:  1. Advair Diskus.  2. Multivitamin.  3. Cardizem 240 mg p.o. daily.  4. Coumadin as directed.  5. Aspirin 81 mg p.o. daily.  6. Diovan 320 mg p.o. daily.  7. Lopressor 25 mg p.o. b.i.d.  8. Lipitor 20 mg p.o. daily.  9. Hydrochlorothiazide 12.5 mg p.o. daily.  10.Fish oil.  11.Flax seed oil.  12.Vitamin C.   PHYSICAL EXAMINATION:  VITAL SIGNS:  Blood pressure 148/95, pulse 99.  He weighs 214 pounds.  HEENT:  Normal.  NECK:  Supple with no bruits.  CHEST:  Clear.  CARDIOVASCULAR:  Regular rate and rhythm.  There is a crisp mechanical  valve sound.  There is a soft 1/6 systolic murmur at the apex.  ABDOMEN:  Benign with no pulsatile masses or bruits.  EXTREMITIES:  No edema.   Electrocardiogram shows a normal sinus rhythm at a rate of 96.  The axis  is normal.  There are occasional PACs.  There are no ST-T changes noted.   DIAGNOSES:  1. Status post mitral valve replacement with a St. Jude valve in 1998.      We will continue with Coumadin with a goal international normalized      ratio of 2.5 to 3.5, and he will continue on his enteric-coated      aspirin 81 mg by mouth daily.  2. Hypertension.  His blood pressure is elevated today, but he has not      taken his Lopressor or Diovan recently.  We will resume those.      When he returns for fasting lipids and liver in 6 weeks, we will      check a  basic metabolic panel as well as a complete blood count      given his Coumadin use.  3. Coumadin.  He will continue to be followed in the Coumadin Clinic.  4. Hyperlipidemia.  We will resume his Lipitor, and we will check      lipids and liver in 6 weeks.  5. History of asthma.   He will continue with SBE prophylaxis, and we will see him back in 1  year.     Madolyn Frieze Jens Som, MD, [facility]  Electronically Signed    BSC/MedQ  DD: 08/29/2006  DT: 08/30/2006  Job #: [id number]

## 2010-07-19 NOTE — Assessment & Plan Note (Signed)
[facility]                         GASTROENTEROLOGY OFFICE NOTE   NAME:Corey Mathis, Corey Mathis                      MRN:          [id number]  DATE:10/31/2006                            DOB:          Oct 12, 1950    CHIEF COMPLAINT:  Screening colonoscopy, but he takes Coumadin.  He has  mitral valve replacement.   ASSESSMENT:  A 60 year old [place] American man with no gastrointestinal  symptoms.  However he needs screening colonoscopy.  Last screening exam  was a sigmoidoscopy in 2000 by Dr. Debby Bud.  He takes Coumadin for a  mitral valve.   PLAN:  After discussing the options of CT colonoscopy, heparinization,  Lovenox window and performing the procedure on Coumadin the patient has  elected to have a screening colonoscopy on Coumadin.  He understands I  will be able to remove small lesions but could not use cautery which  could require another colonoscopy within a year.  Risks, benefits and  indications including bleeding, infection, perforation, possible need  for surgery explained to the patient.   HISTORY:  As above.   MEDICATIONS:  1. Advair Diskus 100/50 two puffs daily.  2. Multivitamin daily.  3. Diltiazem 240 mg daily.  4. Coumadin as directed.  5. Enteric coated aspirin 81 mg daily.  6. Diovan 320 mg daily.  7. Metoprolol 25 mg twice daily.  8. Lipitor 20 mg daily.  9. HCTZ 12.5 mg daily.  10.Fish oil - omega 3 daily.  11.Flax seed oil daily.  12.Vitamin C daily.  13.Flonase p.r.n.   ALLERGIES:  ALTACE, PROPRANOLOL, CODEINE, VYTORIN, COREG.   PAST MEDICAL HISTORY:  1. Hypertension.  2. Dyslipidemia.  3. Mitral valve replacement 1998, followed by Dr. Jens Som at this      time.  4. Allergies.  5. Anxiety.  6. Asthma/reactive airways disease.  7. Jaw surgery.  8. Cyst removal from hand and neck.  9. History of labyrinthitis.   SOCIAL HISTORY/FAMILY HISTORY/REVIEW OF SYSTEMS:  He is a English as a second language teacher.  No alcohol, tobacco or  drugs.  One son, one daughter.  Has a  PhD.  Lives with his wife.   No colon cancer in the family.   PHYSICAL EXAMINATION:  Height 6 foot 4, weight 215 pounds, blood  pressure 126/72, pulse 76.  EYES:  Anicteric.  NECK:  Supple.  CHEST:  Clear.  HEART:  S1 and S2, there is a metallic click with S1 and a soft systolic  murmur at the apex 1/6.  ABDOMEN:  Soft, nontender, no organomegaly.  He is alert and oriented x3.   I appreciate the opportunity to care for this patient.     Iva Boop, MD,FACG  Electronically Signed    CEG/MedQ  DD: 10/31/2006  DT: 11/01/2006  Job #: [id number]   cc:   Rosalyn Gess. Norins, MD

## 2010-08-05 ENCOUNTER — Encounter: Payer: Self-pay | Admitting: *Deleted

## 2010-08-08 ENCOUNTER — Other Ambulatory Visit: Payer: Self-pay | Admitting: *Deleted

## 2010-08-08 ENCOUNTER — Ambulatory Visit (INDEPENDENT_AMBULATORY_CARE_PROVIDER_SITE_OTHER): Payer: Self-pay | Admitting: *Deleted

## 2010-08-08 DIAGNOSIS — Z9889 Other specified postprocedural states: Secondary | ICD-10-CM

## 2010-08-08 DIAGNOSIS — I059 Rheumatic mitral valve disease, unspecified: Secondary | ICD-10-CM

## 2010-08-08 MED ORDER — ATORVASTATIN CALCIUM 20 MG PO TABS: 20.0000 mg | ORAL_TABLET | Freq: Every day | ORAL | Status: DC

## 2010-08-26 ENCOUNTER — Ambulatory Visit (INDEPENDENT_AMBULATORY_CARE_PROVIDER_SITE_OTHER): Payer: Self-pay | Admitting: Family Medicine

## 2010-08-26 DIAGNOSIS — I10 Essential (primary) hypertension: Secondary | ICD-10-CM

## 2010-08-26 DIAGNOSIS — J45909 Unspecified asthma, uncomplicated: Secondary | ICD-10-CM

## 2010-08-26 DIAGNOSIS — J329 Chronic sinusitis, unspecified: Secondary | ICD-10-CM

## 2010-08-26 DIAGNOSIS — J310 Chronic rhinitis: Secondary | ICD-10-CM

## 2010-08-26 MED ORDER — AMOXICILLIN-POT CLAVULANATE 875-125 MG PO TABS: 1.0000 | ORAL_TABLET | Freq: Two times a day (BID) | ORAL | Status: AC

## 2010-08-26 MED ORDER — ALBUTEROL SULFATE HFA 108 (90 BASE) MCG/ACT IN AERS: 2.0000 | INHALATION_SPRAY | Freq: Four times a day (QID) | RESPIRATORY_TRACT | Status: DC | PRN

## 2010-08-26 MED ORDER — FLUTICASONE PROPIONATE 50 MCG/ACT NA SUSP: 2.0000 | Freq: Every day | NASAL | Status: DC

## 2010-08-26 MED ORDER — BUDESONIDE-FORMOTEROL FUMARATE 160-4.5 MCG/ACT IN AERO: 2.0000 | INHALATION_SPRAY | Freq: Two times a day (BID) | RESPIRATORY_TRACT | Status: DC

## 2010-08-26 MED ORDER — PREDNISONE 20 MG PO TABS: ORAL_TABLET | ORAL | Status: DC

## 2010-08-26 NOTE — Assessment & Plan Note (Signed)
He is not well controlled today. I told him he did need to restart his lisinopril. He continues to have dry mouth even after limiting several of his over-the-counter agents that can cause dry mouth then we could consider discontinuing his diuretic. 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 We will recheck his blood pressure in 2 weeks.

## 2010-08-26 NOTE — Patient Instructions (Addendum)
Symbicort 2 puff twice a day.  Stop the 12 hour nasal spray and use the precription nasal spray.  Use your proair 3 x a day for about 5 days, then 2 x a day for 3 days, then 1 x a day for 3 days.

## 2010-08-26 NOTE — Assessment & Plan Note (Signed)
He is wheezing on exam today and was in the red zone based on his peak flows. We did give him a neb treatment with albuterol and Atrovent while he was here. He did feel a little better after the treatment but his peak flow improved only minimally. I gave him a sample of albuterol inhaler so that he can start using it 3 times a day. I also switched him from Advair to simple court. He was given a sample. I'm going to also start him on steroids as well. Patient is to followup in about 2 weeks with Dr. Cathey Endow to make sure that he is improving. If not he will need a chest x-ray.

## 2010-08-26 NOTE — Progress Notes (Signed)
  Subjective:    Patient ID: Corey Mathis, male    DOB: Aug 13, 1950, 60 y.o.   MRN: [id number]  HPI  Constant nasal drainage and congestion.Low energy.  No fever.  Has had constant drianage and congestion for several years.  Does get some relief with Advil for his sinus headaches. Tried flonase at one time and helped some.  He is also taking an antihistamine and decongestant. He also notices he has been wheezing over this last week. He says he has a rescue inhaler that used to be her friends but he does not have a prescription for his own. He says that the Advair seems to give him a cough and he wonders if there's something different he could use.  Mouth stays persistantly driv. He occ feels SOB with activities. Using Advair. There he also has been taking several different over-the-counter medications and just bleeding a decongestant, nasal Afrin, and a generic form of Allegra.  Says his didn take his lisinopril this today because he forget. Says when he takes it his BP is usually well controlled.   He was on amoxicillin prophylactically for a dental tooth extraction about a month ago.  He does have a history of mitral valve prolapse.  Review of Systems     Objective:   Physical Exam  Constitutional: He is oriented to person, place, and time. He appears well-developed and well-nourished.  HENT:  Head: Normocephalic and atraumatic.  Right Ear: External ear normal.  Left Ear: External ear normal.  Nose: Nose normal.  Mouth/Throat: Oropharynx is clear and moist.       TMs and canals are clear.   Eyes: Conjunctivae and EOM are normal. Pupils are equal, round, and reactive to light.  Neck: Neck supple. No thyromegaly present.  Cardiovascular: Normal rate and normal heart sounds.   Pulmonary/Chest: Effort normal. He has wheezes.  Lymphadenopathy:    He has no cervical adenopathy.  Neurological: He is alert and oriented to person, place, and time.  Skin: Skin is warm and dry.    Psychiatric: He has a normal mood and affect.          Assessment & Plan:  Dry mouth-no report is listed over-the-counter medications and marked for several that can cause dry mouth. I asked him to start with stopping these first. If he continues to have dry mouth we can consider stopping his diuretic as well. 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  Sinusitis/bronchitis-I will have him on Augmentin. We will also treat with a course of steroids. He also still has a baseline level of chronic nasal congestion. I like him to get back on a nasal steroid. Avoid over-the-counter nasal decongestants. He may even benefit from adding a nasal antihistamine. He may also benefit from allergy testing to see if there are some avoidance measures that would help reduce his frequency of symptoms. It is also possible that his rhinitis is not allergic related. They're typically nasal steroids will help this as well.

## 2010-08-29 MED ORDER — IPRATROPIUM BROMIDE 0.02 % IN SOLN
0.5000 mg | Freq: Once | RESPIRATORY_TRACT | Status: AC
  Administered 2010-08-29: 0.5 mg via RESPIRATORY_TRACT

## 2010-08-29 MED ORDER — ALBUTEROL SULFATE (2.5 MG/3ML) 0.083% IN NEBU
2.5000 mg | INHALATION_SOLUTION | Freq: Once | RESPIRATORY_TRACT | Status: AC
  Administered 2010-08-29: 2.5 mg via RESPIRATORY_TRACT

## 2010-09-05 ENCOUNTER — Encounter: Payer: Self-pay | Admitting: *Deleted

## 2010-09-08 ENCOUNTER — Ambulatory Visit (INDEPENDENT_AMBULATORY_CARE_PROVIDER_SITE_OTHER): Payer: PRIVATE HEALTH INSURANCE | Admitting: *Deleted

## 2010-09-08 DIAGNOSIS — I059 Rheumatic mitral valve disease, unspecified: Secondary | ICD-10-CM

## 2010-09-08 DIAGNOSIS — Z9889 Other specified postprocedural states: Secondary | ICD-10-CM

## 2010-09-08 MED ORDER — WARFARIN SODIUM 5 MG PO TABS: ORAL_TABLET | ORAL | Status: DC

## 2010-09-19 ENCOUNTER — Ambulatory Visit (INDEPENDENT_AMBULATORY_CARE_PROVIDER_SITE_OTHER): Payer: PRIVATE HEALTH INSURANCE | Admitting: Family Medicine

## 2010-09-19 ENCOUNTER — Encounter: Payer: Self-pay | Admitting: Family Medicine

## 2010-09-19 VITALS — BP 119/75 | HR 87 | Ht 76.0 in | Wt 181.0 lb

## 2010-09-19 DIAGNOSIS — I059 Rheumatic mitral valve disease, unspecified: Secondary | ICD-10-CM

## 2010-09-19 DIAGNOSIS — Z1211 Encounter for screening for malignant neoplasm of colon: Secondary | ICD-10-CM

## 2010-09-19 DIAGNOSIS — E785 Hyperlipidemia, unspecified: Secondary | ICD-10-CM

## 2010-09-19 DIAGNOSIS — J45909 Unspecified asthma, uncomplicated: Secondary | ICD-10-CM

## 2010-09-19 MED ORDER — BUDESONIDE-FORMOTEROL FUMARATE 160-4.5 MCG/ACT IN AERO: 2.0000 | INHALATION_SPRAY | Freq: Two times a day (BID) | RESPIRATORY_TRACT | Status: DC

## 2010-09-19 NOTE — Assessment & Plan Note (Signed)
I reviewed his labs I do not see a recent lipid panel from the last 12 months. He did say he was supposed to get blood work but hasn't gone yet. I encouraged him to do so.

## 2010-09-19 NOTE — Assessment & Plan Note (Signed)
He is much improved today. There is no wheezing on exam he is not having to use his inhaler at nighttime. I spent him that I would still like to try to wean down his rescue inhaler. Right now he starting on the higher strength of the Symbicort. I also recommended he start an antihistamine sometime in August since he does have difficulty with ragweed season. He is to continue that until November. Also continue the nasal steroid. At this point I see no indication that he has a sinus infection.

## 2010-09-19 NOTE — Patient Instructions (Addendum)
Check with our insurance to see if they cover a shingles (Zostavax)  vaccine and a Tdap (tetanus) vaccine.  Let us know if you want to start getting your coumadin checked here.

## 2010-09-19 NOTE — Assessment & Plan Note (Signed)
For his Coumadin, and I will be happy to manage that here in our office but he needs to discuss that first with the cardiology office where he is currently getting it checked.

## 2010-09-19 NOTE — Progress Notes (Signed)
  Subjective:    Patient ID: Corey Mathis, male    DOB: Jul 02, 1950, 60 y.o.   MRN: [id number]  HPI Asthma-much improved per patient. Still some drainage out of sinuses.  HE has not wheezing. Not using inhaler at night. Still using rescue inhaler still twice a day but feels so much better.  Still using his flonase.  Has been cleanign his house and thinks this has aggravated his sinuses as he has noticed some frontal facial pressure low-grade headache.  He would like to starting his Coumadin checked at our office is closer to his work and home.  He also notes he is overdue by a couple of years  for his colonoscopy. Last colonoscopy was done at [facility].  Review of Systems     Objective:   Physical Exam  Constitutional: He appears well-developed and well-nourished.  HENT:  Head: Normocephalic and atraumatic.  Cardiovascular: Normal rate, regular rhythm and normal heart sounds.   Pulmonary/Chest: Effort normal and breath sounds normal.  Neurological: He is alert.  Skin: Skin is warm and dry.  Psychiatric: He has a normal mood and affect.          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

## 2010-09-29 ENCOUNTER — Ambulatory Visit (INDEPENDENT_AMBULATORY_CARE_PROVIDER_SITE_OTHER): Payer: PRIVATE HEALTH INSURANCE | Admitting: *Deleted

## 2010-09-29 DIAGNOSIS — Z9889 Other specified postprocedural states: Secondary | ICD-10-CM

## 2010-09-29 DIAGNOSIS — I059 Rheumatic mitral valve disease, unspecified: Secondary | ICD-10-CM

## 2010-10-06 ENCOUNTER — Encounter: Payer: PRIVATE HEALTH INSURANCE | Admitting: *Deleted

## 2010-10-07 ENCOUNTER — Ambulatory Visit (INDEPENDENT_AMBULATORY_CARE_PROVIDER_SITE_OTHER): Payer: PRIVATE HEALTH INSURANCE | Admitting: *Deleted

## 2010-10-07 DIAGNOSIS — Z9889 Other specified postprocedural states: Secondary | ICD-10-CM

## 2010-10-07 DIAGNOSIS — I059 Rheumatic mitral valve disease, unspecified: Secondary | ICD-10-CM

## 2010-10-18 ENCOUNTER — Encounter: Payer: Self-pay | Admitting: Cardiology

## 2010-10-20 ENCOUNTER — Encounter: Payer: PRIVATE HEALTH INSURANCE | Admitting: *Deleted

## 2010-11-04 ENCOUNTER — Other Ambulatory Visit: Payer: Self-pay | Admitting: *Deleted

## 2010-11-04 MED ORDER — METOPROLOL TARTRATE 50 MG PO TABS: 50.0000 mg | ORAL_TABLET | Freq: Two times a day (BID) | ORAL | Status: DC

## 2010-11-11 ENCOUNTER — Encounter: Payer: Self-pay | Admitting: Family Medicine

## 2010-11-24 ENCOUNTER — Ambulatory Visit: Payer: PRIVATE HEALTH INSURANCE | Admitting: Family Medicine

## 2010-11-24 DIAGNOSIS — Z0289 Encounter for other administrative examinations: Secondary | ICD-10-CM

## 2010-12-08 ENCOUNTER — Other Ambulatory Visit: Payer: Self-pay | Admitting: Family Medicine

## 2010-12-08 MED ORDER — BUDESONIDE-FORMOTEROL FUMARATE 160-4.5 MCG/ACT IN AERO: 2.0000 | INHALATION_SPRAY | Freq: Two times a day (BID) | RESPIRATORY_TRACT | Status: DC

## 2010-12-08 NOTE — Telephone Encounter (Signed)
Pt. Stopped by the office and asked for refill of his symbicort medication. Plan:  Refill for # 1 inhaler/1 refill sent electroncialy. Corey Newcomer, LPN [facility]

## 2010-12-30 ENCOUNTER — Ambulatory Visit: Payer: PRIVATE HEALTH INSURANCE | Admitting: Family Medicine

## 2010-12-30 DIAGNOSIS — Z0289 Encounter for other administrative examinations: Secondary | ICD-10-CM

## 2011-01-06 ENCOUNTER — Ambulatory Visit (INDEPENDENT_AMBULATORY_CARE_PROVIDER_SITE_OTHER): Payer: PRIVATE HEALTH INSURANCE | Admitting: *Deleted

## 2011-01-06 ENCOUNTER — Other Ambulatory Visit: Payer: Self-pay | Admitting: Pharmacist

## 2011-01-06 DIAGNOSIS — Z9889 Other specified postprocedural states: Secondary | ICD-10-CM

## 2011-01-06 DIAGNOSIS — I059 Rheumatic mitral valve disease, unspecified: Secondary | ICD-10-CM

## 2011-01-06 LAB — POCT INR: INR: 4.4

## 2011-01-06 MED ORDER — WARFARIN SODIUM 5 MG PO TABS: 10.0000 mg | ORAL_TABLET | Freq: Every day | ORAL | Status: DC

## 2011-01-06 MED ORDER — WARFARIN SODIUM 5 MG PO TABS: ORAL_TABLET | ORAL | Status: DC

## 2011-01-20 ENCOUNTER — Encounter: Payer: PRIVATE HEALTH INSURANCE | Admitting: *Deleted

## 2011-01-25 ENCOUNTER — Ambulatory Visit (INDEPENDENT_AMBULATORY_CARE_PROVIDER_SITE_OTHER): Payer: PRIVATE HEALTH INSURANCE | Admitting: *Deleted

## 2011-01-25 DIAGNOSIS — I059 Rheumatic mitral valve disease, unspecified: Secondary | ICD-10-CM

## 2011-01-25 DIAGNOSIS — Z7901 Long term (current) use of anticoagulants: Secondary | ICD-10-CM | POA: Insufficient documentation

## 2011-01-25 DIAGNOSIS — Z9889 Other specified postprocedural states: Secondary | ICD-10-CM

## 2011-01-25 MED ORDER — DILTIAZEM HCL ER COATED BEADS 240 MG PO CP24: 240.0000 mg | ORAL_CAPSULE | Freq: Every day | ORAL | Status: DC

## 2011-02-10 ENCOUNTER — Ambulatory Visit (INDEPENDENT_AMBULATORY_CARE_PROVIDER_SITE_OTHER): Payer: PRIVATE HEALTH INSURANCE | Admitting: Family Medicine

## 2011-02-10 DIAGNOSIS — Z23 Encounter for immunization: Secondary | ICD-10-CM

## 2011-02-10 NOTE — Progress Notes (Signed)
  Subjective:    Patient ID: Corey Mathis, male    DOB: 01-25-1951, 60 y.o.   MRN: [id number]  HPI flu shot administered    Review of Systems     Objective:   Physical Exam        Assessment & Plan:

## 2011-02-11 ENCOUNTER — Other Ambulatory Visit: Payer: Self-pay | Admitting: Cardiology

## 2011-02-13 ENCOUNTER — Ambulatory Visit (INDEPENDENT_AMBULATORY_CARE_PROVIDER_SITE_OTHER): Payer: PRIVATE HEALTH INSURANCE | Admitting: Family Medicine

## 2011-02-13 ENCOUNTER — Encounter: Payer: Self-pay | Admitting: Family Medicine

## 2011-02-13 VITALS — BP 138/88 | HR 98 | Ht 76.0 in | Wt 185.0 lb

## 2011-02-13 DIAGNOSIS — J3489 Other specified disorders of nose and nasal sinuses: Secondary | ICD-10-CM

## 2011-02-13 DIAGNOSIS — J329 Chronic sinusitis, unspecified: Secondary | ICD-10-CM

## 2011-02-13 DIAGNOSIS — N451 Epididymitis: Secondary | ICD-10-CM

## 2011-02-13 DIAGNOSIS — R300 Dysuria: Secondary | ICD-10-CM

## 2011-02-13 DIAGNOSIS — R0981 Nasal congestion: Secondary | ICD-10-CM

## 2011-02-13 DIAGNOSIS — N453 Epididymo-orchitis: Secondary | ICD-10-CM

## 2011-02-13 LAB — POCT URINALYSIS DIPSTICK
Bilirubin, UA: NEGATIVE
Blood, UA: NEGATIVE
Glucose, UA: NEGATIVE
Leukocytes, UA: NEGATIVE
Nitrite, UA: NEGATIVE

## 2011-02-13 MED ORDER — PREDNISONE 20 MG PO TABS: ORAL_TABLET | ORAL | Status: DC

## 2011-02-13 MED ORDER — LEVOFLOXACIN 500 MG PO TABS: 500.0000 mg | ORAL_TABLET | Freq: Every day | ORAL | Status: AC

## 2011-02-13 NOTE — Progress Notes (Signed)
  Subjective:    Patient ID: Corey Mathis, male    DOB: 04/17/1950, 60 y.o.   MRN: [id number]  HPI Asthma - Says he feels doing well but says mouth is dry and chronic nasal congestion. Still using flonase and helps some. Eye have been watering.  Took the flu hot last Friday.  Ran out of the diltiazem until them.  Lot of nasal phlegm.  + post nasal drip. No fever area in he now feels he has been getting more nausea the last few days.  10 yrs ago had problems with pelvic discomfort. Started again about 3 month. Has chronic diarrhea.  Noticed some swelling initally in the testile area. Feel sore. Took some saw palmetto. Now feels nauseated in his stomach.  Had mumps as a child and has had a large one ever since them. Says the cords are still swollen but this is better but still really sore.    Review of Systems     Objective:   Physical Exam  Constitutional: He is oriented to person, place, and time. He appears well-developed and well-nourished.  HENT:  Head: Normocephalic and atraumatic.  Right Ear: External ear normal.  Left Ear: External ear normal.  Nose: Nose normal.  Mouth/Throat: Oropharynx is clear and moist.       TMs and canals are clear.   Eyes: Conjunctivae and EOM are normal. Pupils are equal, round, and reactive to light.  Neck: Neck supple. No thyromegaly present.  Cardiovascular: Normal rate and normal heart sounds.   Pulmonary/Chest: Effort normal and breath sounds normal.  Genitourinary:       I did not do a testicular exam today as he says the actual swelling seems to have resolved he is now just having soreness.  Lymphadenopathy:    He has no cervical adenopathy.  Neurological: He is alert and oriented to person, place, and time.  Skin: Skin is warm and dry.  Psychiatric: He has a normal mood and affect.          Assessment & Plan:  Chronic nasal congestion- Will schedule with ENT. Will repeat the steroids as this really helped him in the past.  Continue the  nasal steroids. I also sent her a prescription for Levaquin.  Epididymitis/orchitis- will tx with Levauin 500mg  x 10 days, as likely not sexually transmitted Says his had a prostat check ealier this year. He doesn plan on scheduling a CPE  Asthma-he is in the yellow zone today. He says he has been using his inhalers regularly. We will try a course of steroids and see if this helps him. As well as his nasal congestion.

## 2011-02-13 NOTE — Patient Instructions (Signed)
We will call with the referral. 

## 2011-02-15 ENCOUNTER — Encounter: Payer: Self-pay | Admitting: Family Medicine

## 2011-02-16 ENCOUNTER — Telehealth: Payer: Self-pay | Admitting: Cardiology

## 2011-02-16 NOTE — Telephone Encounter (Signed)
Pt needs to go off coumadin for 4 days prior to colonscopy on 12-18, pls call

## 2011-02-16 NOTE — Telephone Encounter (Signed)
Needs lovenox bridge. Corey Mathis  

## 2011-02-16 NOTE — Telephone Encounter (Signed)
Will forward for dr Jens Som review, last seen 02/2010

## 2011-02-17 MED ORDER — ENOXAPARIN SODIUM 80 MG/0.8ML ~~LOC~~ SOLN: 80.0000 mg | Freq: Two times a day (BID) | SUBCUTANEOUS | Status: DC

## 2011-02-17 NOTE — Telephone Encounter (Signed)
Spoke with pt, aware sally is going to call today

## 2011-02-17 NOTE — Telephone Encounter (Signed)
Fu Call: Pt calling to speak with Stanton Kidney further. Please return pt call to discuss further.

## 2011-02-17 NOTE — Telephone Encounter (Signed)
Spoke with pt, he is aware not to take his coumadin today. Discussed with sally putt, pharm md, she will call and discuss lovenox with pt

## 2011-02-17 NOTE — Telephone Encounter (Signed)
Spoke with pt.  Last weight- 84kg.  Will dose at 80 mg  BID.  Pt given following instructions  12/14- Hold Coumadin  12/15- Lovenox 80mg  BID  12/16- Lovenox 80mg  BID 12/17- Lovenox 80mg  in AM 12/18- Day of Procedure.  When okay with MD, Restart Lovenox 80mg  BID and restart coumadin with 15mg  x 2 days then 10mg  daily.   12/21- Recheck INR.

## 2011-02-21 ENCOUNTER — Other Ambulatory Visit: Payer: Self-pay | Admitting: *Deleted

## 2011-02-21 NOTE — Telephone Encounter (Signed)
Spoke with Harriett Sine, at [facility] and confirmed ENOXAPARIN, was approved for 30 days up to 1 year.

## 2011-02-22 ENCOUNTER — Encounter: Payer: PRIVATE HEALTH INSURANCE | Admitting: *Deleted

## 2011-02-24 ENCOUNTER — Ambulatory Visit (INDEPENDENT_AMBULATORY_CARE_PROVIDER_SITE_OTHER): Payer: PRIVATE HEALTH INSURANCE | Admitting: *Deleted

## 2011-02-24 ENCOUNTER — Encounter: Payer: Self-pay | Admitting: Family Medicine

## 2011-02-24 DIAGNOSIS — I059 Rheumatic mitral valve disease, unspecified: Secondary | ICD-10-CM

## 2011-02-24 DIAGNOSIS — Z9889 Other specified postprocedural states: Secondary | ICD-10-CM

## 2011-02-24 DIAGNOSIS — Z7901 Long term (current) use of anticoagulants: Secondary | ICD-10-CM

## 2011-02-24 LAB — POCT INR: INR: 1.4

## 2011-02-25 ENCOUNTER — Other Ambulatory Visit: Payer: Self-pay | Admitting: Cardiology

## 2011-02-27 ENCOUNTER — Ambulatory Visit (INDEPENDENT_AMBULATORY_CARE_PROVIDER_SITE_OTHER): Payer: PRIVATE HEALTH INSURANCE | Admitting: *Deleted

## 2011-02-27 DIAGNOSIS — Z9889 Other specified postprocedural states: Secondary | ICD-10-CM

## 2011-02-27 DIAGNOSIS — Z7901 Long term (current) use of anticoagulants: Secondary | ICD-10-CM

## 2011-02-27 DIAGNOSIS — I059 Rheumatic mitral valve disease, unspecified: Secondary | ICD-10-CM

## 2011-02-27 LAB — POCT INR: INR: 3.3

## 2011-03-02 ENCOUNTER — Encounter: Payer: Self-pay | Admitting: Family Medicine

## 2011-03-13 ENCOUNTER — Encounter: Payer: Self-pay | Admitting: Family Medicine

## 2011-03-13 ENCOUNTER — Encounter: Payer: PRIVATE HEALTH INSURANCE | Admitting: *Deleted

## 2011-03-13 ENCOUNTER — Ambulatory Visit (INDEPENDENT_AMBULATORY_CARE_PROVIDER_SITE_OTHER): Payer: PRIVATE HEALTH INSURANCE | Admitting: Family Medicine

## 2011-03-13 VITALS — BP 123/70 | HR 70 | Ht 76.0 in | Wt 196.0 lb

## 2011-03-13 DIAGNOSIS — Z0000 Encounter for general adult medical examination without abnormal findings: Secondary | ICD-10-CM

## 2011-03-13 DIAGNOSIS — L723 Sebaceous cyst: Secondary | ICD-10-CM

## 2011-03-13 DIAGNOSIS — Z23 Encounter for immunization: Secondary | ICD-10-CM

## 2011-03-13 DIAGNOSIS — D179 Benign lipomatous neoplasm, unspecified: Secondary | ICD-10-CM

## 2011-03-13 NOTE — Progress Notes (Addendum)
Subjective:    Patient ID: Corey Mathis, male    DOB: 10-21-1950, 61 y.o.   MRN: [id number]  HPI Here for CPE today. No complaints. Doing well     Review of Systems Comprehensive ROS is neg.     BP 123/70  Pulse 70  Ht 6\' 4"  (1.93 m)  Wt 196 lb (88.905 kg)  BMI 23.86 kg/m2    Allergies  Allergen Reactions  . Carvedilol   . Codeine Sulfate   . Ezetimibe-Simvastatin   . Propranolol Hcl   . Ramipril     Past Medical History  Diagnosis Date  . Labyrinthitis     hx  . Asthma   . Anxiety   . Allergy history unknown   . Mitral valve replaced     hx  . Dyslipidemia   . HTN (hypertension)   . Allergy   . Hyperlipidemia     Past Surgical History  Procedure Date  . Jaw surgery (other)   . Acne cyst removal     from hand and back  . Valve replacement 1998    St. Jude, mitral    History   Social History  . Marital Status: Married    Spouse Name: Frenchie     Number of Children: 2  . 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 Smoking status: Never Smoker   . Smokeless tobacco: Not on file  . Alcohol Use: No  . Drug Use: No  . Sexually Active: Not on file   Other Topics Concern  . Not on file   Social History Narrative   Some exercise, bike and walking.  2 cups per day.     Family History  Problem Relation Age of Onset  . Heart attack Father 64  . Colon cancer Mother 33  . Hypertension Mother     Current outpatient prescriptions:albuterol (PROAIR HFA) 108 (90 BASE) MCG/ACT inhaler, Inhale 2 puffs into the lungs every 6 (six) hours as needed for wheezing., Disp: 1 Inhaler, Rfl: 0;  aspirin 81 MG EC tablet, Take 81 mg by mouth daily.  , Disp: , Rfl: ;  atorvastatin (LIPITOR) 20 MG tablet, Take 1 tablet (20 mg total) by mouth daily., Disp: 30 tablet, Rfl: 11 budesonide-formoterol (SYMBICORT) 160-4.5 MCG/ACT inhaler, Inhale 2 puffs into the lungs 2 (two) times daily., Disp: 1 Inhaler, Rfl: 1;   diltiazem (CARDIZEM CD) 240 MG 24 hr capsule, Take 1 capsule (240 mg total) by mouth daily., Disp: 30 capsule, Rfl: 11;  enoxaparin (LOVENOX) 80 MG/0.8ML SOLN injection, Inject 0.8 mLs (80 mg total) into the skin every 12 (twelve) hours., Disp: 15 Syringe, Rfl: 1 fluticasone (FLONASE) 50 MCG/ACT nasal spray, Place 2 sprays into the nose daily., Disp: 16 g, Rfl: 12;  lisinopril-hydrochlorothiazide (PRINZIDE,ZESTORETIC) 20-12.5 MG per tablet, TAKE ONE TABLET BY MOUTH EVERY DAY, Disp: 30 tablet, Rfl: 5;  metoprolol (LOPRESSOR) 50 MG tablet, Take 1 tablet (50 mg total) by mouth 2 (two) times daily., Disp: 60 tablet, Rfl: 6 predniSONE (DELTASONE) 20 MG tablet, 2 tabs by mouth dialy for one week, then one a day for one week., Disp: 21 tablet, Rfl: 0;  warfarin (COUMADIN) 5 MG tablet, TAKE TWO TABLETS BY MOUTH EVERY DAY, Disp: 60 tablet, Rfl: 3    Objective:   Physical Exam  Constitutional: He is oriented to person, place, and time. He appears well-developed and well-nourished.  HENT:  Head: Normocephalic and atraumatic.  Right Ear: External ear normal.  Left Ear: External ear normal.  Nose: Nose normal.  Mouth/Throat: Oropharynx is clear and moist.  Eyes: Conjunctivae and EOM are normal. Pupils are equal, round, and reactive to light.  Neck: Normal range of motion. Neck supple. No thyromegaly present.  Cardiovascular: Normal rate, regular rhythm, normal heart sounds and intact distal pulses.   Pulmonary/Chest: Effort normal and breath sounds normal.  Abdominal: Soft. Bowel sounds are normal. He exhibits no distension and no mass. There is no tenderness. There is no rebound and no guarding.  Genitourinary: Rectum normal. Guaiac negative stool.       Prostate is 2+ enlarged. No nodules or bogginess.   Musculoskeletal: Normal range of motion.  Lymphadenopathy:    He has no cervical adenopathy.  Neurological: He is alert and oriented to person, place, and time. He has normal reflexes.  Skin: Skin is  warm and dry.       He has a larger lipoma on right upper back and a large sebaceous cyst on his right upper chest.   Psychiatric: He has a normal mood and affect. His behavior is normal. Judgment and thought content normal.          Assessment & Plan:  Start a regular exercise program and make sure you are eating a healthy diet Try to eat 4 servings of dairy a day or take a calcium supplement (500mg  twice a day). Your vaccines are up to date.  Pneumonia vac updated.   Will refer to gen surgery for lipoma and sebaceous cyst removal. He is on coumadin for valve replacement.   He did complete an AUA symptom score today. As during our discussion he did note some night time urinary frequency. He scored a 27 which is indicative of severe symptoms. As far as his quality of life reported 4 which is mostly dissatisfied. We called him back to let him know that we could certainly consider starting a medication for BPH. And then followup in 4-6 weeks to see how he is doing on the medication. Then we can repeat his AUA score.

## 2011-03-13 NOTE — Patient Instructions (Signed)
We will call you with your lab results. If you don't here from [place] in about a week then please give [place] a call at [phone]. Start a regular exercise program and make sure you are eating a healthy diet Try to eat 4 servings of dairy a day or take a calcium supplement (500mg  twice a day). Your vaccines are up to date.

## 2011-03-16 ENCOUNTER — Telehealth: Payer: Self-pay | Admitting: Family Medicine

## 2011-03-16 NOTE — Telephone Encounter (Signed)
Pt informed

## 2011-03-16 NOTE — Telephone Encounter (Signed)
Please call patient. I did review the questionnaire that he completed for his prostate. He scored very high at 27. I would like him to consider starting a prostate medication to help her to size his prostate and improve the flow of urination and for his bladder. If he is okay with this health center for a new prescription and I can see him back in 4-6 weeks to see how is doing on the medication. If ok with med with start with terazosin 1mg  at bedtime. Then inc to 2 tabs after 2 weeks.

## 2011-03-22 ENCOUNTER — Ambulatory Visit (INDEPENDENT_AMBULATORY_CARE_PROVIDER_SITE_OTHER): Payer: PRIVATE HEALTH INSURANCE | Admitting: General Surgery

## 2011-04-01 ENCOUNTER — Other Ambulatory Visit: Payer: Self-pay | Admitting: Family Medicine

## 2011-04-03 ENCOUNTER — Encounter: Payer: Self-pay | Admitting: *Deleted

## 2011-04-11 ENCOUNTER — Ambulatory Visit (INDEPENDENT_AMBULATORY_CARE_PROVIDER_SITE_OTHER): Payer: PRIVATE HEALTH INSURANCE | Admitting: *Deleted

## 2011-04-11 DIAGNOSIS — I059 Rheumatic mitral valve disease, unspecified: Secondary | ICD-10-CM

## 2011-04-11 DIAGNOSIS — Z7901 Long term (current) use of anticoagulants: Secondary | ICD-10-CM

## 2011-04-11 DIAGNOSIS — Z9889 Other specified postprocedural states: Secondary | ICD-10-CM

## 2011-04-11 LAB — POCT INR: INR: 4

## 2011-04-26 ENCOUNTER — Encounter (INDEPENDENT_AMBULATORY_CARE_PROVIDER_SITE_OTHER): Payer: Self-pay | Admitting: General Surgery

## 2011-04-26 ENCOUNTER — Ambulatory Visit (INDEPENDENT_AMBULATORY_CARE_PROVIDER_SITE_OTHER): Payer: PRIVATE HEALTH INSURANCE | Admitting: General Surgery

## 2011-04-26 VITALS — BP 140/90 | HR 72 | Temp 96.8°F | Resp 18 | Ht 76.0 in | Wt 197.5 lb

## 2011-04-26 DIAGNOSIS — D1779 Benign lipomatous neoplasm of other sites: Secondary | ICD-10-CM

## 2011-04-26 DIAGNOSIS — D171 Benign lipomatous neoplasm of skin and subcutaneous tissue of trunk: Secondary | ICD-10-CM | POA: Insufficient documentation

## 2011-04-26 DIAGNOSIS — L723 Sebaceous cyst: Secondary | ICD-10-CM

## 2011-04-26 NOTE — Progress Notes (Signed)
Patient ID: Corey Mathis, male   DOB: 1950-04-23, 61 y.o.   MRN: [id number]  Chief Complaint  Patient presents with  . Cyst    HPI Corey Mathis is a 61 y.o. male.   HPIPatient has a several year history of a cyst on his right anterior chest. It occasionally drains foul smelling material. He has not had acute infection of the area. 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 He initially developed after some localized trauma. He has been getting larger and bothering him. He would like to have both areas removed. He is on Coumadin for his heart valve.  Past Medical History  Diagnosis Date  . Labyrinthitis     hx  . Asthma   . Anxiety   . Allergy history unknown   . Mitral valve replaced     hx  . Dyslipidemia   . HTN (hypertension)   . Allergy   . Hyperlipidemia   . CHF (congestive heart failure)     Past Surgical History  Procedure Date  . Jaw surgery (other)   . Acne cyst removal     from hand and back  . Valve replacement 1998    St. Jude, mitral    Family History  Problem Relation Age of Onset  . Heart attack Father 54  . Colon cancer Mother 18  . Hypertension Mother     Social History History  Substance Use Topics  . Smoking status: Former Smoker    Quit date: 04/26/1991  . Smokeless tobacco: Not on file  . Alcohol Use: No    Allergies  Allergen Reactions  . Carvedilol   . Codeine Sulfate   . Ezetimibe-Simvastatin   . Propranolol Hcl   . Ramipril     Current Outpatient Prescriptions  Medication Sig Dispense Refill  . albuterol (PROAIR HFA) 108 (90 BASE) MCG/ACT inhaler Inhale 2 puffs into the lungs every 6 (six) hours as needed for wheezing.  1 Inhaler  0  . aspirin 81 MG EC tablet Take 81 mg by mouth daily.        Marland Kitchen atorvastatin (LIPITOR) 20 MG tablet Take 1 tablet (20 mg total) by mouth daily.  30 tablet  11  . diltiazem (CARDIZEM CD) 240 MG 24 hr capsule Take 1 capsule (240 mg total) by mouth daily.  30 capsule  11  . fluticasone  (FLONASE) 50 MCG/ACT nasal spray Place 2 sprays into the nose daily.  16 g  12  . lisinopril-hydrochlorothiazide (PRINZIDE,ZESTORETIC) 20-12.5 MG per tablet TAKE ONE TABLET BY MOUTH EVERY DAY  30 tablet  5  . metoprolol (LOPRESSOR) 50 MG tablet Take 1 tablet (50 mg total) by mouth 2 (two) times daily.  60 tablet  6  . predniSONE (DELTASONE) 20 MG tablet 2 tabs by mouth dialy for one week, then one a day for one week.  21 tablet  0  . SYMBICORT 160-4.5 MCG/ACT inhaler INHALE TWO PUFFS BY MOUTH TWICE DAILY  11 g  0  . warfarin (COUMADIN) 5 MG tablet TAKE TWO TABLETS BY MOUTH EVERY DAY  60 tablet  3    Review of Systems Review of Systems  Constitutional: Negative.   HENT: Negative.   Eyes: Negative.   Respiratory: Negative.   Cardiovascular:       Mitral valve replacement with St. Jude valve On Coumadin  Gastrointestinal: Negative.   Genitourinary: Negative.   Musculoskeletal: Negative.   Neurological: Negative.     Blood pressure 140/90,  pulse 72, temperature 96.8 F (36 C), temperature source Temporal, resp. rate 18, height 6\' 4"  (1.93 m), weight 197 lb 8 oz (89.585 kg).  Physical Exam Physical Exam  Constitutional: He appears well-developed and well-nourished.  HENT:  Head: Normocephalic and atraumatic.  Eyes: Pupils are equal, round, and reactive to light.  Neck: Normal range of motion.  Cardiovascular: Normal rate and regular rhythm.        mechanicall heart sounds  Pulmonary/Chest: Effort normal and breath sounds normal.        2Cm sebaceous cyst right anterior chest, no drainage  Abdominal: Soft. He exhibits no distension. There is no tenderness.  Skin:       7 cm subcutaneous mass of left scapula consistent with lipoma    Data Reviewed Epic notes  Assessment    right anterior chest sebaceous cyst, left back lipoma    Plan    I have offered excision of this right chest sebaceous cyst and excision of his left back lipoma under general anesthesia. He does need  cardiac clearance and will need to come off his Coumadin. First colonoscopy he went on a Lovenox bridge. That will need to be arranged with help from his cardiologist. He is set to see Dr.Crenshaw on March 5. Once we obtain cardiac clearance with a four-day scheduling. Procedure, risks, benefits, and other issues were discussed in detail. His questions were answered. He is agreeable.       Yarel Rushlow E 04/26/2011, 10:30 AM

## 2011-05-09 ENCOUNTER — Ambulatory Visit (INDEPENDENT_AMBULATORY_CARE_PROVIDER_SITE_OTHER): Payer: PRIVATE HEALTH INSURANCE

## 2011-05-09 ENCOUNTER — Encounter: Payer: Self-pay | Admitting: Cardiology

## 2011-05-09 ENCOUNTER — Ambulatory Visit (INDEPENDENT_AMBULATORY_CARE_PROVIDER_SITE_OTHER): Payer: PRIVATE HEALTH INSURANCE | Admitting: Cardiology

## 2011-05-09 DIAGNOSIS — E7800 Pure hypercholesterolemia, unspecified: Secondary | ICD-10-CM

## 2011-05-09 DIAGNOSIS — I2510 Atherosclerotic heart disease of native coronary artery without angina pectoris: Secondary | ICD-10-CM

## 2011-05-09 DIAGNOSIS — Z125 Encounter for screening for malignant neoplasm of prostate: Secondary | ICD-10-CM

## 2011-05-09 DIAGNOSIS — I059 Rheumatic mitral valve disease, unspecified: Secondary | ICD-10-CM

## 2011-05-09 DIAGNOSIS — Z7901 Long term (current) use of anticoagulants: Secondary | ICD-10-CM

## 2011-05-09 DIAGNOSIS — Z9889 Other specified postprocedural states: Secondary | ICD-10-CM

## 2011-05-09 LAB — BASIC METABOLIC PANEL
BUN: 15 mg/dL (ref 6–23)
Calcium: 9.3 mg/dL (ref 8.4–10.5)
Creatinine, Ser: 1.1 mg/dL (ref 0.4–1.5)
GFR: 90.76 mL/min (ref >60.00)
Glucose, Bld: 104 mg/dL — ABNORMAL HIGH (ref 70–99)

## 2011-05-09 LAB — CBC WITH DIFFERENTIAL/PLATELET
Basophils Absolute: 0 10*3/uL (ref 0.0–0.1)
Eosinophils Absolute: 0.2 10*3/uL (ref 0.0–0.7)
HCT: 45.5 % (ref 39.0–52.0)
Lymphs Abs: 0.9 10*3/uL (ref 0.7–4.0)
MCHC: 33 g/dL (ref 30.0–36.0)
MCV: 89.5 fl (ref 78.0–100.0)
Monocytes Absolute: 0.5 10*3/uL (ref 0.1–1.0)
Platelets: 158 10*3/uL (ref 150.0–400.0)
RDW: 13.9 % (ref 11.5–14.6)

## 2011-05-09 LAB — LIPID PANEL
HDL: 38.4 mg/dL — ABNORMAL LOW (ref >39.00)
Triglycerides: 89 mg/dL (ref 0.0–149.0)
VLDL: 17.8 mg/dL (ref 0.0–40.0)

## 2011-05-09 LAB — PSA: PSA: 0.99 ng/mL (ref 0.10–4.00)

## 2011-05-09 LAB — HEPATIC FUNCTION PANEL: Albumin: 4.1 g/dL (ref 3.5–5.2)

## 2011-05-09 NOTE — Assessment & Plan Note (Signed)
Continue statin. Check lipids and liver. 

## 2011-05-09 NOTE — Assessment & Plan Note (Signed)
Continue SBE prophylaxis. Continue Coumadin and aspirin. 

## 2011-05-09 NOTE — Assessment & Plan Note (Signed)
Followed in the Coumadin clinic. Check CBC.

## 2011-05-09 NOTE — Progress Notes (Signed)
HPI: [name] is a very pleasant gentleman who is status post mitral valve replacement with a St. Jude valve in 1998. A pre-operative cardiac catheterization revealed normal coronary arteries. The last echocardiogram in Dec 2011 showed normal LV function. The left atrium was mildly dilated. 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 I last saw him in Dec 2011. Since I last saw him he denies any dyspnea on exertion, orthopnea, PND, pedal edema, palpitations or syncope. There is no chest pain. No bleeding.  Current Outpatient Prescriptions  Medication Sig Dispense Refill  . albuterol (PROAIR HFA) 108 (90 BASE) MCG/ACT inhaler Inhale 2 puffs into the lungs every 6 (six) hours as needed for wheezing.  1 Inhaler  0  . aspirin 81 MG EC tablet Take 81 mg by mouth daily.        Marland Kitchen atorvastatin (LIPITOR) 20 MG tablet Take 1 tablet (20 mg total) by mouth daily.  30 tablet  11  . diltiazem (CARDIZEM CD) 240 MG 24 hr capsule Take 1 capsule (240 mg total) by mouth daily.  30 capsule  11  . fluticasone (FLONASE) 50 MCG/ACT nasal spray Place 2 sprays into the nose daily.  16 g  12  . lisinopril-hydrochlorothiazide (PRINZIDE,ZESTORETIC) 20-12.5 MG per tablet TAKE ONE TABLET BY MOUTH EVERY DAY  30 tablet  5  . metoprolol (LOPRESSOR) 50 MG tablet Take 1 tablet (50 mg total) by mouth 2 (two) times daily.  60 tablet  6  . SYMBICORT 160-4.5 MCG/ACT inhaler INHALE TWO PUFFS BY MOUTH TWICE DAILY  11 g  0  . warfarin (COUMADIN) 5 MG tablet TAKE TWO TABLETS BY MOUTH EVERY DAY  60 tablet  3     Past Medical History  Diagnosis Date  . Labyrinthitis     hx  . Asthma   . Anxiety   . Allergy history unknown   . Mitral valve replaced     hx  . Dyslipidemia   . HTN (hypertension)   . Allergy   . CHF (congestive heart failure)     Past Surgical History  Procedure Date  . Jaw surgery (other)   . Acne cyst removal     from hand and back  . Valve replacement 1998    St. Jude, mitral    History   Social  History  . Marital Status: Married    Spouse Name: Frenchie     Number of Children: 2  . 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 Smoking status: Former Smoker    Quit date: 04/26/1991  . Smokeless tobacco: Not on file  . Alcohol Use: No  . Drug Use: No  . Sexually Active: Not on file   Other Topics Concern  . Not on file   Social History Narrative   Some exercise, bike and walking.  2 cups per day.     ROS: Some problems with sinus congestion but no fevers or chills, productive cough, hemoptysis, dysphasia, odynophagia, melena, hematochezia, dysuria, hematuria, rash, seizure activity, orthopnea, PND, pedal edema, claudication. Remaining systems are negative.  Physical Exam: Well-developed well-nourished in no acute distress.  Skin is warm and dry.  HEENT is normal.  Neck is supple. No thyromegaly.  Chest is clear to auscultation with normal expansion.  Cardiovascular exam is regular rate and rhythm. Crisp mechanical valve sounds Abdominal exam nontender or distended. No masses palpated. Extremities show no edema. neuro grossly intact  ECG  Sinus with first degree AV block, no ST changes.

## 2011-05-09 NOTE — Patient Instructions (Signed)
Your physician wants you to follow-up in: ONE YEAR  You will receive a reminder letter in the mail two months in advance. If you don't receive a letter, please call our office to schedule the follow-up appointment.   Your physician recommends that you return for lab work in: TODAY  

## 2011-05-09 NOTE — Patient Instructions (Signed)
Call us once surgery scheduled, so we can bridge you with Lovenox prior to and after surgery per Dr Jens Som.  Pt has done Lovenox in the past.

## 2011-05-09 NOTE — Assessment & Plan Note (Signed)
Blood pressure controlled. Continue present medications. Check potassium and renal function. 

## 2011-05-10 ENCOUNTER — Encounter: Payer: Self-pay | Admitting: *Deleted

## 2011-05-10 ENCOUNTER — Telehealth (INDEPENDENT_AMBULATORY_CARE_PROVIDER_SITE_OTHER): Payer: Self-pay

## 2011-05-10 ENCOUNTER — Other Ambulatory Visit (INDEPENDENT_AMBULATORY_CARE_PROVIDER_SITE_OTHER): Payer: Self-pay | Admitting: General Surgery

## 2011-05-10 ENCOUNTER — Telehealth: Payer: Self-pay | Admitting: Cardiology

## 2011-05-10 NOTE — Telephone Encounter (Signed)
New problem:  Per [place] Dec, will Dr. Jens Som be managing his Lovenox  Bridge before surgery.  Surgery pending. Fax # [phone].

## 2011-05-10 NOTE — Telephone Encounter (Signed)
Debra informed me that the [facility]madin Clinic will manage the Lovenox bridge perioperatively.  We just need to inform them of the surgery date.  I can call [phone] and ask for the clinic.  I will inform Dr Janee Morn so he can write the orders.

## 2011-05-10 NOTE — Telephone Encounter (Signed)
Spoke with Corey Mathis, we will be managing the lovenox. She will call back with the surgery date so we can contact the pt to get lovenox bridge started

## 2011-05-17 ENCOUNTER — Other Ambulatory Visit: Payer: Self-pay | Admitting: *Deleted

## 2011-05-17 MED ORDER — TERAZOSIN HCL 1 MG PO CAPS: ORAL_CAPSULE | ORAL | Status: DC

## 2011-05-19 ENCOUNTER — Other Ambulatory Visit: Payer: Self-pay | Admitting: Physician Assistant

## 2011-05-19 ENCOUNTER — Ambulatory Visit (INDEPENDENT_AMBULATORY_CARE_PROVIDER_SITE_OTHER): Payer: PRIVATE HEALTH INSURANCE | Admitting: Physician Assistant

## 2011-05-19 ENCOUNTER — Encounter: Payer: Self-pay | Admitting: Physician Assistant

## 2011-05-19 VITALS — BP 129/86 | HR 65 | Ht 76.0 in | Wt 195.0 lb

## 2011-05-19 DIAGNOSIS — R220 Localized swelling, mass and lump, head: Secondary | ICD-10-CM

## 2011-05-19 DIAGNOSIS — K130 Diseases of lips: Secondary | ICD-10-CM

## 2011-05-19 MED ORDER — ACYCLOVIR 400 MG PO TABS: 400.0000 mg | ORAL_TABLET | Freq: Three times a day (TID) | ORAL | Status: AC

## 2011-05-19 NOTE — Patient Instructions (Signed)
Will call with results of culture. Start Acyclovir 400mg  three times a day for 10 days. Return to clinic for follow up if not resolved in 10 days.

## 2011-05-19 NOTE — Progress Notes (Signed)
  Subjective:    Patient ID: Corey Mathis, male    DOB: [birth year], 61 y.o.   MRN: [id number]  HPI Patient presents to clinic with a swollen lip and ulceration. On Sunday after burning is lip after drinking some hot tea he began noticing that his lip was hurting. That night it began to swell on his upper lip. The swelling has continued to increase despite taken up to 8 tablets of bendaryl, witch hazel and using antibiotic ointment 3 times a day. He describes the pain as a buring sensation. Nothing has made lip better and nothing makes it worse. He denies any shortness of breath, wheezing, problems swallowing, or throat pain. He has not started any new medications or put any new substance on his lips. Patient has had an history of cold sores that have felt similar but his lip has never swollen up before.    Review of Systems     Objective:   Physical Exam  Constitutional: He is oriented to person, place, and time. He appears well-developed and well-nourished.  HENT:  Head: Normocephalic and atraumatic.  Mouth/Throat: Oropharynx is clear and moist. No oropharyngeal exudate.  Neck: Normal range of motion.  Cardiovascular: Normal rate, regular rhythm and normal heart sounds.   Pulmonary/Chest: Effort normal and breath sounds normal. He has no wheezes.  Lymphadenopathy:    He has no cervical adenopathy.  Neurological: He is alert and oriented to person, place, and time.  Skin:       Upper lip swollen with ulceration on left side that has a blister and is opened with some discharge.  Lower lip normal.   Psychiatric: He has a normal mood and affect. His behavior is normal.          Assessment & Plan:  Swollen lip/lip lesion-suspect viral origin such as herpes simplex 1.sent a viral culture off. Will call with results. Started on Acyclovir. Encouraged patient to continue taking benadryl. Follow up if not improving in the next 3 to 4 days.

## 2011-05-29 ENCOUNTER — Ambulatory Visit (INDEPENDENT_AMBULATORY_CARE_PROVIDER_SITE_OTHER): Payer: PRIVATE HEALTH INSURANCE | Admitting: Physician Assistant

## 2011-05-29 ENCOUNTER — Encounter: Payer: Self-pay | Admitting: Physician Assistant

## 2011-05-29 VITALS — BP 135/90 | HR 71 | Ht 76.0 in | Wt 196.0 lb

## 2011-05-29 DIAGNOSIS — B009 Herpesviral infection, unspecified: Secondary | ICD-10-CM

## 2011-05-29 MED ORDER — ACYCLOVIR 5 % EX OINT: TOPICAL_OINTMENT | CUTANEOUS | Status: AC

## 2011-05-29 MED ORDER — PREDNISONE 20 MG PO TABS: 20.0000 mg | ORAL_TABLET | Freq: Every day | ORAL | Status: AC

## 2011-05-29 NOTE — Progress Notes (Signed)
  Subjective:    Patient ID: Corey Mathis, male    DOB: 09-30-1950, 61 y.o.   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 Patient had the same symptoms with ulceration and lip swelling about 2 weeks ago. Patient was given acyclovir for 10 days. Patient's lip was getting better and then on Thursday night(4 days ago) he noticed his lip started to swell again. He does report some pain and burning it is most concerned about how big his lips getting and how uncomfortable it is. He does not remember any injury to his left and has never had this reoccurring problem before. He does state that he swells easily. He has taken Benadryl 2-4 tabs every day has not helped at all.  22 days exacerbation of herpes patient does have serious questions concerning the need function. Patient states that he has had many illnesses and inflammatory problems his whole life. He recently have blood work done with his cardiologist and his white blood cell count was 3.2. He was evaluated for HIV last year it was negative. He denies any IV drug use or else we'll sexual partners. He reports today that he has not had sex in over 2 years.   Review of Systems     Objective:   Physical Exam  Skin:       Upper lip is swollen with 2 mm ulceration under the lip on the right side.          Assessment & Plan:  Herpes simplex-patient was told to continue on with acyclovir 400 mg 3 times a day.he was also given acyclovir ointment to apply every 3 hours to the lesion. He was also given prednisone 40 mg to take for the next 5 days. She was told to call if not improving in the next 3-4 days. Once lesions resolve although check another white blood count. At some point we could consider referring to immunology.

## 2011-05-29 NOTE — Patient Instructions (Addendum)
Finish Acylovir and add ointment can use ointment every 3 hours. Start prednisone 40mg  for 5 days. Call if not improving. Will recheck WBC count when feeling better.

## 2011-06-15 ENCOUNTER — Ambulatory Visit (INDEPENDENT_AMBULATORY_CARE_PROVIDER_SITE_OTHER): Payer: PRIVATE HEALTH INSURANCE | Admitting: *Deleted

## 2011-06-15 DIAGNOSIS — Z7901 Long term (current) use of anticoagulants: Secondary | ICD-10-CM

## 2011-06-15 DIAGNOSIS — Z9889 Other specified postprocedural states: Secondary | ICD-10-CM

## 2011-06-15 DIAGNOSIS — I059 Rheumatic mitral valve disease, unspecified: Secondary | ICD-10-CM

## 2011-06-15 LAB — POCT INR: INR: 4.3

## 2011-06-20 ENCOUNTER — Telehealth: Payer: Self-pay | Admitting: *Deleted

## 2011-06-20 DIAGNOSIS — I10 Essential (primary) hypertension: Secondary | ICD-10-CM

## 2011-06-20 NOTE — Telephone Encounter (Signed)
Message copied by Freddi Starr on Tue Jun 20, 2011  7:35 AM ------      Message from: Carmela Hurt      Created: Thu Jun 15, 2011  4:45 PM      Regarding: lisinopril       Has been off for lisinopril since 06/14/2011, he was having some swelling and lesions inside upper lip, was reoccurring after taking lisinopril, was on antibiotics and prednisone ordered by dentist, this help temporarily, dentist had some knowledge of lisinopril causing this with her and her brother, he stopped med on his own 06/14/2011 and swelling and lesions have improved by 80%, he is asking regarding another B/P med in replacement.            He states been on lisinopril for years.             Thanks, Kim A.

## 2011-06-22 MED ORDER — HYDROCHLOROTHIAZIDE 12.5 MG PO CAPS: 12.5000 mg | ORAL_CAPSULE | Freq: Every day | ORAL | Status: DC

## 2011-06-22 NOTE — Telephone Encounter (Signed)
Spoke with pt, per dr Jens Som pt to stop lisinopril and start HCTZ 12.5 mg once daily. He will have a bmp in one week. He will track his bp and let us know.

## 2011-06-22 NOTE — Telephone Encounter (Signed)
Fu call °Patient returning your call °

## 2011-06-29 ENCOUNTER — Other Ambulatory Visit (INDEPENDENT_AMBULATORY_CARE_PROVIDER_SITE_OTHER): Payer: PRIVATE HEALTH INSURANCE

## 2011-06-29 ENCOUNTER — Other Ambulatory Visit: Payer: Self-pay | Admitting: Cardiology

## 2011-06-29 DIAGNOSIS — I10 Essential (primary) hypertension: Secondary | ICD-10-CM

## 2011-06-30 LAB — BASIC METABOLIC PANEL
CO2: 32 mEq/L (ref 19–32)
Calcium: 9.3 mg/dL (ref 8.4–10.5)
Creatinine, Ser: 1 mg/dL (ref 0.4–1.5)

## 2011-07-01 ENCOUNTER — Other Ambulatory Visit: Payer: Self-pay | Admitting: Family Medicine

## 2011-07-03 ENCOUNTER — Other Ambulatory Visit: Payer: Self-pay | Admitting: *Deleted

## 2011-07-03 MED ORDER — WARFARIN SODIUM 5 MG PO TABS: 5.0000 mg | ORAL_TABLET | Freq: Every day | ORAL | Status: DC

## 2011-07-03 MED ORDER — ALBUTEROL SULFATE HFA 108 (90 BASE) MCG/ACT IN AERS: 2.0000 | INHALATION_SPRAY | Freq: Four times a day (QID) | RESPIRATORY_TRACT | Status: DC | PRN

## 2011-07-11 ENCOUNTER — Encounter: Payer: Self-pay | Admitting: *Deleted

## 2011-07-28 ENCOUNTER — Telehealth: Payer: Self-pay | Admitting: Cardiology

## 2011-07-28 DIAGNOSIS — E876 Hypokalemia: Secondary | ICD-10-CM

## 2011-07-28 NOTE — Telephone Encounter (Signed)
Patient called no answer.LMTC. 

## 2011-07-28 NOTE — Telephone Encounter (Signed)
Pt rtn call to get lab results °

## 2011-08-01 NOTE — Telephone Encounter (Signed)
Follow-up:    Patient returned Cheryl's call.  Please call back.

## 2011-08-01 NOTE — Telephone Encounter (Signed)
Patient called no answer.LMTC. 

## 2011-08-01 NOTE — Telephone Encounter (Signed)
Spoke with pt, he is aware potassium was low on labs in April. Pt will come by the office tomorrow to have a recheck.

## 2011-08-02 ENCOUNTER — Other Ambulatory Visit (INDEPENDENT_AMBULATORY_CARE_PROVIDER_SITE_OTHER): Payer: PRIVATE HEALTH INSURANCE

## 2011-08-02 DIAGNOSIS — E876 Hypokalemia: Secondary | ICD-10-CM

## 2011-08-04 LAB — BASIC METABOLIC PANEL
BUN: 12 mg/dL (ref 6–23)
Creatinine, Ser: 1.1 mg/dL (ref 0.4–1.5)
GFR: 88.75 mL/min (ref >60.00)
Potassium: 3.8 mEq/L (ref 3.5–5.1)

## 2011-09-21 ENCOUNTER — Telehealth: Payer: Self-pay | Admitting: *Deleted

## 2011-09-21 NOTE — Telephone Encounter (Signed)
LMOM for pt to call and s/c [facility] appt.

## 2011-10-27 ENCOUNTER — Ambulatory Visit (INDEPENDENT_AMBULATORY_CARE_PROVIDER_SITE_OTHER): Payer: 59 | Admitting: *Deleted

## 2011-10-27 DIAGNOSIS — Z9889 Other specified postprocedural states: Secondary | ICD-10-CM

## 2011-10-27 DIAGNOSIS — Z7901 Long term (current) use of anticoagulants: Secondary | ICD-10-CM

## 2011-10-27 DIAGNOSIS — I059 Rheumatic mitral valve disease, unspecified: Secondary | ICD-10-CM

## 2011-10-27 LAB — POCT INR: INR: 3.3

## 2011-11-10 ENCOUNTER — Other Ambulatory Visit: Payer: Self-pay | Admitting: Family Medicine

## 2011-11-10 ENCOUNTER — Telehealth: Payer: Self-pay | Admitting: *Deleted

## 2011-11-10 MED ORDER — WARFARIN SODIUM 5 MG PO TABS: 5.0000 mg | ORAL_TABLET | Freq: Every day | ORAL | Status: DC

## 2011-11-10 NOTE — Telephone Encounter (Signed)
NEW MESSAGE:  PLEASE CALL COUMADIN INTO [facility] ON [address].  HE HAS NO REFILLS.

## 2011-11-13 ENCOUNTER — Other Ambulatory Visit: Payer: Self-pay | Admitting: Cardiology

## 2012-01-01 ENCOUNTER — Other Ambulatory Visit: Payer: Self-pay | Admitting: Cardiology

## 2012-01-10 ENCOUNTER — Other Ambulatory Visit: Payer: Self-pay | Admitting: Cardiology

## 2012-01-10 ENCOUNTER — Telehealth: Payer: Self-pay | Admitting: *Deleted

## 2012-01-10 NOTE — Telephone Encounter (Signed)
Left message for pt to call coumadin clinic [phone] regarding need to make an appt for INR to be done as he has not been seen in clinic since October 27 2011 and we have request to refill his coumadin.

## 2012-01-12 ENCOUNTER — Ambulatory Visit (INDEPENDENT_AMBULATORY_CARE_PROVIDER_SITE_OTHER): Payer: Self-pay | Admitting: *Deleted

## 2012-01-12 DIAGNOSIS — Z7901 Long term (current) use of anticoagulants: Secondary | ICD-10-CM

## 2012-01-12 DIAGNOSIS — I059 Rheumatic mitral valve disease, unspecified: Secondary | ICD-10-CM

## 2012-01-12 DIAGNOSIS — Z9889 Other specified postprocedural states: Secondary | ICD-10-CM

## 2012-01-12 LAB — POCT INR: INR: 1.6

## 2012-01-12 MED ORDER — WARFARIN SODIUM 5 MG PO TABS: ORAL_TABLET | ORAL | Status: DC

## 2012-01-26 ENCOUNTER — Ambulatory Visit (INDEPENDENT_AMBULATORY_CARE_PROVIDER_SITE_OTHER): Payer: Self-pay

## 2012-01-26 DIAGNOSIS — Z7901 Long term (current) use of anticoagulants: Secondary | ICD-10-CM

## 2012-01-26 DIAGNOSIS — I059 Rheumatic mitral valve disease, unspecified: Secondary | ICD-10-CM

## 2012-01-26 DIAGNOSIS — Z9889 Other specified postprocedural states: Secondary | ICD-10-CM

## 2012-02-13 ENCOUNTER — Other Ambulatory Visit: Payer: Self-pay | Admitting: Cardiology

## 2012-03-15 ENCOUNTER — Ambulatory Visit (INDEPENDENT_AMBULATORY_CARE_PROVIDER_SITE_OTHER): Payer: PRIVATE HEALTH INSURANCE | Admitting: *Deleted

## 2012-03-15 DIAGNOSIS — Z9889 Other specified postprocedural states: Secondary | ICD-10-CM

## 2012-03-15 DIAGNOSIS — Z7901 Long term (current) use of anticoagulants: Secondary | ICD-10-CM

## 2012-03-15 DIAGNOSIS — I059 Rheumatic mitral valve disease, unspecified: Secondary | ICD-10-CM

## 2012-03-15 LAB — POCT INR: INR: 5.6

## 2012-03-15 MED ORDER — WARFARIN SODIUM 5 MG PO TABS: ORAL_TABLET | ORAL | Status: DC

## 2012-03-21 ENCOUNTER — Other Ambulatory Visit: Payer: Self-pay | Admitting: Family Medicine

## 2012-03-28 ENCOUNTER — Ambulatory Visit (INDEPENDENT_AMBULATORY_CARE_PROVIDER_SITE_OTHER): Payer: PRIVATE HEALTH INSURANCE | Admitting: *Deleted

## 2012-03-28 DIAGNOSIS — I059 Rheumatic mitral valve disease, unspecified: Secondary | ICD-10-CM

## 2012-03-28 DIAGNOSIS — Z9889 Other specified postprocedural states: Secondary | ICD-10-CM

## 2012-03-28 DIAGNOSIS — Z7901 Long term (current) use of anticoagulants: Secondary | ICD-10-CM

## 2012-04-10 ENCOUNTER — Ambulatory Visit: Payer: PRIVATE HEALTH INSURANCE

## 2012-04-22 ENCOUNTER — Ambulatory Visit (INDEPENDENT_AMBULATORY_CARE_PROVIDER_SITE_OTHER): Payer: PRIVATE HEALTH INSURANCE | Admitting: Family Medicine

## 2012-04-22 ENCOUNTER — Encounter: Payer: Self-pay | Admitting: Family Medicine

## 2012-04-22 VITALS — BP 162/107 | HR 88 | Temp 97.8°F | Wt 179.0 lb

## 2012-04-22 DIAGNOSIS — J45909 Unspecified asthma, uncomplicated: Secondary | ICD-10-CM

## 2012-04-22 DIAGNOSIS — J45901 Unspecified asthma with (acute) exacerbation: Secondary | ICD-10-CM

## 2012-04-22 MED ORDER — MOMETASONE FURO-FORMOTEROL FUM 100-5 MCG/ACT IN AERO: 2.0000 | INHALATION_SPRAY | Freq: Two times a day (BID) | RESPIRATORY_TRACT | Status: DC

## 2012-04-22 MED ORDER — PREDNISONE 20 MG PO TABS: ORAL_TABLET | ORAL | Status: AC

## 2012-04-22 NOTE — Progress Notes (Addendum)
CC: Corey Mathis is a 62 y.o. male is here for Asthma   Subjective: HPI:  Patient complains of wheezing and shortness of breath. This is been present for approximately 9 days. It became noticeable today after he received his flu shot. In hindsight he notes that he ran out of Symbicort 14 days ago. He is using albuterol 4-5 times a day which helps his symptoms for matter of hours. Symptoms are described as mild during the day, moderate at night. Nothing other than above seems to make it better or worse. He shortness of breath is described as mild at rest and quite noticeable with climbing stairs. He is able to walk on flat surfaces without shortness of breath. He has had a mild slightly productive cough. He denies fevers, chills, confusion, lightheadedness, chest pain, hemoptysis, abdominal pain, nausea, posttussive emesis. He denies orthopnea nor PND.  He has been off Singulair primarily due to the cost and over $200 per month. He tells me he is using Advair in the past but had to stop due to cost.  Review Of Systems Outlined In HPI  Past Medical History  Diagnosis Date  . Labyrinthitis     hx  . Asthma   . Anxiety   . Allergy history unknown   . Mitral valve replaced     hx  . Dyslipidemia   . HTN (hypertension)   . Allergy   . CHF (congestive heart failure)      Family History  Problem Relation Age of Onset  . Heart attack Father 2  . Colon cancer Mother 60  . Hypertension Mother      History  Substance Use Topics  . Smoking status: Former Smoker    Quit date: 04/26/1991  . Smokeless tobacco: Not on file  . Alcohol Use: No     Objective: Filed Vitals:   04/22/12 1434  BP: 162/107  Pulse: 88  Temp: 97.8 F (36.6 C)    General: Alert and Oriented, No Acute Distress HEENT: Pupils equal, round, reactive to light. Conjunctivae clear.  External ears unremarkable, canals clear with intact TMs with appropriate landmarks.  Middle ear appears open without effusion.  Pink inferior turbinates.  Moist mucous membranes, pharynx without inflammation nor lesions.  Neck supple without palpable lymphadenopathy nor abnormal masses. Lungs: Comfortable work of breathing, trace wheezes in the peripheral lung fields without rhonchi nor rales. No signs of consolidation Cardiac: Regular rate and rhythm.  No murmurs, rubs, nor gallops.   Extremities: No peripheral edema.  Strong peripheral pulses.  Mental Status: No depression, anxiety, nor agitation. Skin: Warm and dry.  Assessment & Plan: Corey Mathis was seen today for asthma.  Diagnoses and associated orders for this visit:  Asthma with acute exacerbation - predniSONE (DELTASONE) 20 MG tablet; Three tabs at once daily for five days.  Asthma, chronic - mometasone-formoterol (DULERA) 100-5 MCG/ACT AERO; Inhale 2 puffs into the lungs 2 (two) times daily.    Asthma exacerbation: Do not feel that he warrants an antibiotic would like him to start 5 days of prednisone.  Schedule albuterol every 4-6 hours for the next 48 hours. Asthma: Uncontrolled, likely do to stopping Symbicort. I've asked him to bring in prior authorization literature since I don't see it in our electronic medical records. I have given him a prescription for to dulera in order to determine if this would be substantially cheaper. He understands not to take both medications at the same time. He was given 2 samples of Symbicort while  he investigates prices of alternatives.   Patient reports taking cold medicine for the past few days with pseudoephedrine which may account for his elevated blood pressure. He was encouraged to stop taking these medications.   25 minutes spent face-to-face during visit today of which at least 50% was counseling or coordinating care regarding asthma and asthma exacerbation.  Return if symptoms worsen or fail to improve.

## 2012-04-24 ENCOUNTER — Ambulatory Visit (INDEPENDENT_AMBULATORY_CARE_PROVIDER_SITE_OTHER): Payer: PRIVATE HEALTH INSURANCE | Admitting: *Deleted

## 2012-04-24 DIAGNOSIS — Z9889 Other specified postprocedural states: Secondary | ICD-10-CM

## 2012-04-24 DIAGNOSIS — I059 Rheumatic mitral valve disease, unspecified: Secondary | ICD-10-CM

## 2012-04-24 DIAGNOSIS — Z7901 Long term (current) use of anticoagulants: Secondary | ICD-10-CM

## 2012-04-24 MED ORDER — WARFARIN SODIUM 5 MG PO TABS: ORAL_TABLET | ORAL | Status: DC

## 2012-07-17 ENCOUNTER — Other Ambulatory Visit: Payer: Self-pay | Admitting: Cardiology

## 2012-08-13 ENCOUNTER — Telehealth: Payer: Self-pay | Admitting: Cardiology

## 2012-08-13 NOTE — Telephone Encounter (Signed)
New Prob       Pt is currently located in [place], [place] and needs a referral to get his coumadin checked in [place]. Pt states he will be there for the next couple months. Please call.

## 2012-08-13 NOTE — Telephone Encounter (Signed)
Instructed pt that he needs to find a clinic that will check his INR and their  fax number so we will be able to dose his coumadin and also stressed to pt that he has not been seen in our clinic since February 19th and that he needs to get another Doctor in [place] to dose his coumadin if he is to continue to be in [place]. Pt states he will try to get INR  checked today or tomorrow and will call our clinic and let us know what he plans to do regarding his coumadin and fax and phone numbers given for clinic.

## 2012-08-15 ENCOUNTER — Telehealth: Payer: Self-pay | Admitting: Cardiology

## 2012-08-15 ENCOUNTER — Telehealth: Payer: Self-pay | Admitting: *Deleted

## 2012-08-15 NOTE — Telephone Encounter (Signed)
Spoke with patient. Pt states he has been given steroids to take for sinus /nasal congestion. He was told by MD that prescribed this for him to check with his cardiologist to see if OK to take. I will review with Dr Allred,DOD.

## 2012-08-15 NOTE — Telephone Encounter (Signed)
Called pt.  Pt did not call us before he had his blood drawn on 6/10 so the lab threw the sample away.  An order was faxed on 6/11 and message left for patient to go have labs redrawn.  He is aware and will do this afternoon.  Will wait until INR results are received before considering adjusting for Medrol Dose pack.

## 2012-08-15 NOTE — Telephone Encounter (Signed)
Follow Up     Returning phone call. Please call back,

## 2012-08-15 NOTE — Telephone Encounter (Signed)
Reviewed with Dr Johney Frame. His recommendation was for pt to take prednisone if necessary but that  it is  not without side effects. Pt advised. Pt also asking if results of pro time done 08/13/12 received by [facility]. I will forward to [facility] to follow up with patient about PT results.

## 2012-08-15 NOTE — Telephone Encounter (Signed)
New Prob    Pt got a prescription for MEDIROL from Dr. Charlyne Petrin. Would like to know if its ok for him to take this. Please call.

## 2012-08-15 NOTE — Telephone Encounter (Signed)
New Prob      Pt calling in wanting to know results of his recent INR. Please call.

## 2012-08-15 NOTE — Telephone Encounter (Signed)
Received fax from [facility] to send order and demographics for pt to have PT/INR done while in [place], order and demographics sent and called pt  left message on his mobile phone that the order had been sent and he needs to have PT/INR done today.

## 2012-08-19 ENCOUNTER — Ambulatory Visit (INDEPENDENT_AMBULATORY_CARE_PROVIDER_SITE_OTHER): Payer: PRIVATE HEALTH INSURANCE | Admitting: Cardiovascular Disease

## 2012-08-19 DIAGNOSIS — I059 Rheumatic mitral valve disease, unspecified: Secondary | ICD-10-CM

## 2012-08-19 DIAGNOSIS — Z7901 Long term (current) use of anticoagulants: Secondary | ICD-10-CM

## 2012-08-19 DIAGNOSIS — Z9889 Other specified postprocedural states: Secondary | ICD-10-CM

## 2012-08-21 ENCOUNTER — Other Ambulatory Visit: Payer: Self-pay | Admitting: Cardiology

## 2012-08-22 ENCOUNTER — Encounter: Payer: Self-pay | Admitting: Pharmacist

## 2012-09-16 ENCOUNTER — Ambulatory Visit (INDEPENDENT_AMBULATORY_CARE_PROVIDER_SITE_OTHER): Payer: BC Managed Care – PPO | Admitting: *Deleted

## 2012-09-16 DIAGNOSIS — Z9889 Other specified postprocedural states: Secondary | ICD-10-CM

## 2012-09-16 DIAGNOSIS — Z7901 Long term (current) use of anticoagulants: Secondary | ICD-10-CM

## 2012-09-16 DIAGNOSIS — I059 Rheumatic mitral valve disease, unspecified: Secondary | ICD-10-CM

## 2012-09-19 ENCOUNTER — Other Ambulatory Visit: Payer: Self-pay | Admitting: Cardiology

## 2012-09-20 ENCOUNTER — Encounter: Payer: BC Managed Care – PPO | Admitting: Family Medicine

## 2012-09-20 NOTE — Telephone Encounter (Signed)
Fax Received. Refill Completed. Olivea Sonnen Chowoe (R.M.A)   

## 2012-09-23 ENCOUNTER — Encounter: Payer: Self-pay | Admitting: Nurse Practitioner

## 2012-09-23 ENCOUNTER — Telehealth: Payer: Self-pay | Admitting: Cardiovascular Disease

## 2012-09-23 NOTE — Telephone Encounter (Signed)
Checking on status of cardiac clearance request, pt on coumadin, having endoscopic sinus surgery

## 2012-09-23 NOTE — Telephone Encounter (Signed)
Spoke with Corey Mathis at Dr. Lucky Rathke office.  Patient needs endoscopic sinus surgery under general anesthesia; needs cardiac clearance.  Dr. Pollyann Kennedy would like patient to be off Coumadin 5 days prior to sx.  I informed Corey Mathis that Dr. Clifton James was out of the office this week and she said this could wait until his return next week.  I am routing to Dr. Clifton James and Charlotte Crumb, RN

## 2012-09-26 NOTE — Telephone Encounter (Signed)
This is a patient of [facility]. CDM

## 2012-09-26 NOTE — Telephone Encounter (Signed)
Would need lovenox bridge Brian [facility]  

## 2012-09-26 NOTE — Telephone Encounter (Signed)
Spoke with Kennith Center at Dr. Lucky Rathke office.  They are aware pt will need Lovenox bridge.  Surgery not scheduled yet.  Asked that they let us know when it is scheduled.

## 2012-09-27 ENCOUNTER — Ambulatory Visit (INDEPENDENT_AMBULATORY_CARE_PROVIDER_SITE_OTHER): Payer: BC Managed Care – PPO | Admitting: Family Medicine

## 2012-09-27 ENCOUNTER — Encounter: Payer: Self-pay | Admitting: Family Medicine

## 2012-09-27 VITALS — BP 140/98 | HR 68 | Ht 76.0 in | Wt 179.0 lb

## 2012-09-27 DIAGNOSIS — Z0000 Encounter for general adult medical examination without abnormal findings: Secondary | ICD-10-CM

## 2012-09-27 DIAGNOSIS — Z125 Encounter for screening for malignant neoplasm of prostate: Secondary | ICD-10-CM

## 2012-09-27 DIAGNOSIS — I10 Essential (primary) hypertension: Secondary | ICD-10-CM

## 2012-09-27 DIAGNOSIS — J339 Nasal polyp, unspecified: Secondary | ICD-10-CM

## 2012-09-27 DIAGNOSIS — N400 Enlarged prostate without lower urinary tract symptoms: Secondary | ICD-10-CM

## 2012-09-27 MED ORDER — WARFARIN SODIUM 5 MG PO TABS: ORAL_TABLET | ORAL | Status: DC

## 2012-09-27 MED ORDER — ALBUTEROL SULFATE HFA 108 (90 BASE) MCG/ACT IN AERS: 2.0000 | INHALATION_SPRAY | Freq: Four times a day (QID) | RESPIRATORY_TRACT | Status: DC | PRN

## 2012-09-27 MED ORDER — ATORVASTATIN CALCIUM 20 MG PO TABS: 20.0000 mg | ORAL_TABLET | Freq: Every day | ORAL | Status: DC

## 2012-09-27 MED ORDER — FLUTICASONE PROPIONATE 50 MCG/ACT NA SUSP: 2.0000 | Freq: Every day | NASAL | Status: DC

## 2012-09-27 MED ORDER — TERAZOSIN HCL 2 MG PO CAPS: 2.0000 mg | ORAL_CAPSULE | Freq: Every day | ORAL | Status: DC

## 2012-09-27 MED ORDER — METOPROLOL TARTRATE 50 MG PO TABS: ORAL_TABLET | ORAL | Status: DC

## 2012-09-27 MED ORDER — BUDESONIDE-FORMOTEROL FUMARATE 160-4.5 MCG/ACT IN AERO: INHALATION_SPRAY | RESPIRATORY_TRACT | Status: DC

## 2012-09-27 MED ORDER — DILTIAZEM HCL ER COATED BEADS 240 MG PO CP24: ORAL_CAPSULE | ORAL | Status: DC

## 2012-09-27 NOTE — Patient Instructions (Addendum)
You can call anytime to schedule she will fax in with a nurse if you would like. Keep up a regular exercise program and make sure you are eating a healthy diet Try to eat 4 servings of dairy a day, or if you are lactose intolerant take a calcium with vitamin D daily.  Your vaccines are up to date.

## 2012-09-27 NOTE — Progress Notes (Signed)
Subjective:    Patient ID: Corey Mathis, male    DOB: [birth year], 62 y.o.   MRN: [id number]  HPI Here for CPe today. He wants rx sent for 90 days.  Says has been using decongestnat for nasal congestion. Has a plyp that needs surgery/. He has to see Dr. Jens Som, his cardiologist for clearance for surgery and then hopefully he can have the polyp removed in the next couple weeks. He would like to get back on his nasal steroid spray.  BPH - he has been off his terazosin for well over a month. He's noticed a big difference. Especially at nighttime he has been urinating a lot more frequently.  Review of Systems Comprehensive ROS is neg  BP 154/96  Pulse 68  Ht 6\' 4"  (1.93 m)  Wt 179 lb (81.194 kg)  BMI 21.8 kg/m2    Allergies  Allergen Reactions  . Lisinopril Anaphylaxis    angioedema  . Carvedilol   . Codeine Sulfate   . Ezetimibe-Simvastatin   . Propranolol Hcl   . Ramipril     Past Medical History  Diagnosis Date  . Labyrinthitis     hx  . Asthma   . Anxiety   . Allergy history unknown   . Mitral valve replaced     hx  . Dyslipidemia   . HTN (hypertension)   . Allergy   . CHF (congestive heart failure)     Past Surgical History  Procedure Laterality Date  . Jaw surgery (other)    . Acne cyst removal      from hand and back  . Valve replacement  1998    St. Jude, mitral    History   Social History  . Marital Status: Married    Spouse Name: Frenchie     Number of Children: 2  . Years of Education: N/A   Occupational History  . SALES [employer]    Social History Main Topics  . Smoking status: Former Smoker    Quit date: 04/26/1991  . Smokeless tobacco: Not on file  . Alcohol Use: No  . Drug Use: No  . Sexually Active: Not on file   Other Topics Concern  . Not on file   Social History Narrative   Some exercise, bike and walking.  2 cups per day.     Family History  Problem Relation Age of Onset  . Heart attack Father 79  . Colon cancer Mother 63   . Hypertension Mother     Outpatient Encounter Prescriptions as of 09/27/2012  Medication Sig Dispense Refill  . albuterol (PROAIR HFA) 108 (90 BASE) MCG/ACT inhaler Inhale 2 puffs into the lungs every 6 (six) hours as needed for wheezing.  1 Inhaler  3  . terazosin (HYTRIN) 2 MG capsule Take 1 capsule (2 mg total) by mouth at bedtime.  90 capsule  3  . [DISCONTINUED] albuterol (PROAIR HFA) 108 (90 BASE) MCG/ACT inhaler Inhale 2 puffs into the lungs every 6 (six) hours as needed for wheezing.  1 Inhaler  0  . [DISCONTINUED] terazosin (HYTRIN) 1 MG capsule Take 1 capsule by mouth at bedtime x 2 weeks then increase to 2 capsules by mouth at bedtime  45 capsule  0  . aspirin 81 MG EC tablet Take 81 mg by mouth daily.        Marland Kitchen atorvastatin (LIPITOR) 20 MG tablet Take 1 tablet (20 mg total) by mouth daily.  90 tablet  2  . budesonide-formoterol (SYMBICORT) 160-4.5  MCG/ACT inhaler INHALE TWO PUFFS BY MOUTH TWICE DAILY  11 g  6  . diltiazem (CARDIZEM CD) 240 MG 24 hr capsule TAKE ONE CAPSULE BY MOUTH EVERY DAY  90 capsule  0  . fluticasone (FLONASE) 50 MCG/ACT nasal spray Place 2 sprays into the nose daily.  16 g  12  . hydrochlorothiazide (MICROZIDE) 12.5 MG capsule TAKE ONE CAPSULE BY MOUTH ONCE DAILY  30 capsule  0  . metoprolol (LOPRESSOR) 50 MG tablet TAKE ONE TABLET BY MOUTH TWICE DAILY  180 tablet  0  . mometasone-formoterol (DULERA) 100-5 MCG/ACT AERO Inhale 2 puffs into the lungs 2 (two) times daily.  1 Inhaler  1  . warfarin (COUMADIN) 5 MG tablet Take as directed by coumadin clinic  90 tablet  1  . [DISCONTINUED] atorvastatin (LIPITOR) 20 MG tablet Take 1 tablet (20 mg total) by mouth daily.  30 tablet  11  . [DISCONTINUED] diltiazem (CARDIZEM CD) 240 MG 24 hr capsule TAKE ONE CAPSULE BY MOUTH EVERY DAY  30 capsule  10  . [DISCONTINUED] fluticasone (FLONASE) 50 MCG/ACT nasal spray Place 2 sprays into the nose daily.  16 g  12  . [DISCONTINUED] lisinopril-hydrochlorothiazide  (PRINZIDE,ZESTORETIC) 20-12.5 MG per tablet TAKE ONE TABLET BY MOUTH EVERY DAY  30 tablet  5  . [DISCONTINUED] metoprolol (LOPRESSOR) 50 MG tablet TAKE ONE TABLET BY MOUTH TWICE DAILY  60 tablet  3  . [DISCONTINUED] SYMBICORT 160-4.5 MCG/ACT inhaler INHALE TWO PUFFS BY MOUTH TWICE DAILY  11 g  0  . [DISCONTINUED] warfarin (COUMADIN) 5 MG tablet Take as directed by coumadin clinic  60 tablet  3   No facility-administered encounter medications on file as of 09/27/2012.          Objective:   Physical Exam  Constitutional: He is oriented to person, place, and time. He appears well-developed and well-nourished.  HENT:  Head: Normocephalic and atraumatic.  Right Ear: External ear normal.  Left Ear: External ear normal.  Nose: Nose normal.  Mouth/Throat: Oropharynx is clear and moist.  Eyes: Conjunctivae and EOM are normal. Pupils are equal, round, and reactive to light.  Neck: Normal range of motion. Neck supple. No thyromegaly present.  Cardiovascular: Normal rate, regular rhythm, normal heart sounds and intact distal pulses.   No carotid or abdominal bruits  Pulmonary/Chest: Effort normal and breath sounds normal.  Abdominal: Soft. Bowel sounds are normal. He exhibits no distension and no mass. There is no tenderness. There is no rebound and no guarding.  Musculoskeletal: Normal range of motion.  Lymphadenopathy:    He has no cervical adenopathy.  Neurological: He is alert and oriented to person, place, and time. He has normal reflexes.  Skin: Skin is warm and dry.  Psychiatric: He has a normal mood and affect. His behavior is normal. Judgment and thought content normal.          Assessment & Plan:  CPE  Keep up a regular exercise program and make sure you are eating a healthy diet Try to eat 4 servings of dairy a day, or if you are lactose intolerant take a calcium with vitamin D daily.  Your vaccines are up to date.   We'll get screening labs with a CMP, lipid panel and  PSA.  BPH-AUA score is 26 today (QOL scor of 4) which is clearly uncontrolled. I encouraged him to restart his terazosin. We will also check a PSA today.  Nasal polyp-will definitely restart his nasal steroid spray. His blood pressure is  definitely up today and I suspect this is from his recent use of decongestants. Encouraged him to stop this and to use the nasal steroid spray. He can even use Afrin for a couple of days if he needs to. We'll hopefully have this removed in the next couple of weeks.  HTN - uncontrolled. His diastolic is mildly elevated today. He says when he checks at [facility] the bottom number has been high as well. I want him to first restart his terazosin. This will sometimes bring blood pressure down a little. He has a followup with Dr. Jens Som about 2 weeks anyway and they can recheck his pressure pressure at that time. If it's still elevated then consider increasing metoprolol.

## 2012-09-30 ENCOUNTER — Ambulatory Visit (INDEPENDENT_AMBULATORY_CARE_PROVIDER_SITE_OTHER): Payer: BC Managed Care – PPO | Admitting: *Deleted

## 2012-09-30 DIAGNOSIS — I059 Rheumatic mitral valve disease, unspecified: Secondary | ICD-10-CM

## 2012-09-30 DIAGNOSIS — Z9889 Other specified postprocedural states: Secondary | ICD-10-CM

## 2012-09-30 DIAGNOSIS — I10 Essential (primary) hypertension: Secondary | ICD-10-CM

## 2012-09-30 DIAGNOSIS — Z7901 Long term (current) use of anticoagulants: Secondary | ICD-10-CM

## 2012-09-30 DIAGNOSIS — E785 Hyperlipidemia, unspecified: Secondary | ICD-10-CM

## 2012-09-30 LAB — COMPREHENSIVE METABOLIC PANEL
ALT: 26 U/L (ref 0–53)
Albumin: 3.9 g/dL (ref 3.5–5.2)
CO2: 29 mEq/L (ref 19–32)
GFR: 115.01 mL/min (ref >60.00)
Glucose, Bld: 94 mg/dL (ref 70–99)
Potassium: 3.7 mEq/L (ref 3.5–5.1)
Sodium: 135 mEq/L (ref 135–145)
Total Bilirubin: 0.7 mg/dL (ref 0.3–1.2)
Total Protein: 6.9 g/dL (ref 6.0–8.3)

## 2012-09-30 LAB — POCT INR: INR: 3.4

## 2012-09-30 LAB — LIPID PANEL
HDL: 39.6 mg/dL (ref >39.00)
Total CHOL/HDL Ratio: 4
VLDL: 26 mg/dL (ref 0.0–40.0)

## 2012-10-01 ENCOUNTER — Other Ambulatory Visit: Payer: Self-pay | Admitting: Cardiology

## 2012-10-01 LAB — PSA: PSA: 1.14 ng/mL (ref 0.10–4.00)

## 2012-10-03 ENCOUNTER — Encounter (HOSPITAL_COMMUNITY): Payer: Self-pay | Admitting: Pharmacy Technician

## 2012-10-04 ENCOUNTER — Telehealth: Payer: Self-pay | Admitting: *Deleted

## 2012-10-04 NOTE — Telephone Encounter (Signed)
Called and left patient a message to call us so we can set appt at coumadin clinic for Lovenox bridging.   10/16/2012 NASAL SEPTOPLASTY WITH BILATERAL TURBINATE REDUCTION Bilateral General NASAL ENDOSCOPIC POLYPECTOMY/MAXILLARY ANTROSTOMY/ETHMOIDECTOMY/FRONTAL SINUS SURGERY  Hx- MV replacement Goodwin 0.9 Wt 81.19 kg Cc 95.22 Lovenox 80 mg bid  Have made pt appt for 10/10/2012 for Lovenox teaching

## 2012-10-08 ENCOUNTER — Telehealth: Payer: Self-pay | Admitting: *Deleted

## 2012-10-08 DIAGNOSIS — R748 Abnormal levels of other serum enzymes: Secondary | ICD-10-CM

## 2012-10-08 NOTE — Telephone Encounter (Signed)
cmp ordered to recheck liver enzymes. 

## 2012-10-09 MED ORDER — HYDROMORPHONE HCL PF 1 MG/ML IJ SOLN
INTRAMUSCULAR | Status: AC
  Filled 2012-10-09: qty 1

## 2012-10-10 ENCOUNTER — Encounter (HOSPITAL_COMMUNITY)
Admission: RE | Admit: 2012-10-10 | Discharge: 2012-10-10 | Disposition: A | Discharge status: Home / Self Care | Payer: BC Managed Care – PPO | Source: Ambulatory Visit | Attending: Otolaryngology | Admitting: Otolaryngology

## 2012-10-10 ENCOUNTER — Encounter (HOSPITAL_COMMUNITY): Payer: Self-pay

## 2012-10-10 ENCOUNTER — Ambulatory Visit (INDEPENDENT_AMBULATORY_CARE_PROVIDER_SITE_OTHER): Payer: BC Managed Care – PPO | Admitting: *Deleted

## 2012-10-10 ENCOUNTER — Ambulatory Visit (HOSPITAL_COMMUNITY)
Admission: RE | Admit: 2012-10-10 | Discharge: 2012-10-10 | Disposition: A | Discharge status: Home / Self Care | Payer: BC Managed Care – PPO | Source: Ambulatory Visit | Attending: Anesthesiology | Admitting: Anesthesiology

## 2012-10-10 DIAGNOSIS — Z01810 Encounter for preprocedural cardiovascular examination: Secondary | ICD-10-CM | POA: Insufficient documentation

## 2012-10-10 DIAGNOSIS — R9431 Abnormal electrocardiogram [ECG] [EKG]: Secondary | ICD-10-CM | POA: Insufficient documentation

## 2012-10-10 DIAGNOSIS — Z01812 Encounter for preprocedural laboratory examination: Secondary | ICD-10-CM | POA: Insufficient documentation

## 2012-10-10 DIAGNOSIS — I10 Essential (primary) hypertension: Secondary | ICD-10-CM | POA: Insufficient documentation

## 2012-10-10 DIAGNOSIS — Z9889 Other specified postprocedural states: Secondary | ICD-10-CM

## 2012-10-10 DIAGNOSIS — J328 Other chronic sinusitis: Secondary | ICD-10-CM | POA: Insufficient documentation

## 2012-10-10 DIAGNOSIS — Z7901 Long term (current) use of anticoagulants: Secondary | ICD-10-CM

## 2012-10-10 DIAGNOSIS — I517 Cardiomegaly: Secondary | ICD-10-CM | POA: Insufficient documentation

## 2012-10-10 DIAGNOSIS — I059 Rheumatic mitral valve disease, unspecified: Secondary | ICD-10-CM

## 2012-10-10 DIAGNOSIS — E785 Hyperlipidemia, unspecified: Secondary | ICD-10-CM

## 2012-10-10 DIAGNOSIS — Z01818 Encounter for other preprocedural examination: Secondary | ICD-10-CM | POA: Insufficient documentation

## 2012-10-10 DIAGNOSIS — Z954 Presence of other heart-valve replacement: Secondary | ICD-10-CM | POA: Insufficient documentation

## 2012-10-10 DIAGNOSIS — J330 Polyp of nasal cavity: Secondary | ICD-10-CM | POA: Insufficient documentation

## 2012-10-10 LAB — BASIC METABOLIC PANEL
BUN: 12 mg/dL (ref 6–23)
Chloride: 96 mEq/L (ref 96–112)
GFR calc Af Amer: 73 mL/min — ABNORMAL LOW (ref >90)
Potassium: 3.6 mEq/L (ref 3.5–5.1)
Sodium: 136 mEq/L (ref 135–145)

## 2012-10-10 LAB — PROTIME-INR
INR: 4.29 — ABNORMAL HIGH (ref 0.00–1.49)
Prothrombin Time: 54.9 s (ref 10.2–12.4)

## 2012-10-10 LAB — CBC
HCT: 41.7 % (ref 39.0–52.0)
Hemoglobin: 14.8 g/dL (ref 13.0–17.0)
MCHC: 35.5 g/dL (ref 30.0–36.0)
RBC: 4.96 MIL/uL (ref 4.22–5.81)
WBC: 4.4 10*3/uL (ref 4.0–10.5)

## 2012-10-10 LAB — APTT: aPTT: 51 seconds — ABNORMAL HIGH (ref 24–37)

## 2012-10-10 MED ORDER — ENOXAPARIN SODIUM 80 MG/0.8ML ~~LOC~~ SOLN: 80.0000 mg | Freq: Two times a day (BID) | SUBCUTANEOUS | Status: DC

## 2012-10-10 NOTE — Pre-Procedure Instructions (Signed)
ONEAL SCHOENBERGER  10/10/2012   Your procedure is scheduled on: Wednesday, August 13th.  Report to [facility] at 9:00AM.  Call this number if you have problems the morning of surgery: [phone]   Remember:   Do not eat food or drink liquids after midnight.   Take these medicines the morning of surgery with A SIP OF WATER: diltiazem (CARDIZEM CD), metoprolol (LOPRESSOR). Use inhalers and bring Albuterol Inhaler to the hospital with yo    Do not wear jewelry, make-up or nail polish.  Do not wear lotions, powders, or perfumes. You may wear deodorant.              Men may shave face and neck.  Do not bring valuables to the hospital.  [facility] is not responsible for any belongings or valuables.  Contacts, dentures or bridgework may not be worn into surgery.  Leave suitcase in the car. After surgery it may be brought to your room.  For patients admitted to the hospital, checkout time is 11:00 AM the day of  discharge.   Patients discharged the day of surgery will not be allowed to drive home.  Name and phone number of your driver: -   Special Instructions: Shower using CHG 2 nights before surgery and the night before surgery.  If you shower the day of surgery use CHG.  Use special wash - you have one bottle of CHG for all showers.  You should use approximately 1/3 of the bottle for each shower.   Please read over the following fact sheets that you were given: Pain Booklet, Coughing and Deep Breathing and Surgical Site Infection Prevention

## 2012-10-10 NOTE — H&P (Signed)
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 Discussed  He returns now, worse than ever, ready for an operation. He had a course of prednisone in [place] while on the road which helped a little bit temporarily. On exam the left nasal cavity is filled with polypoid mass. Right side there is mucosal edema but no polyps visualized. We'll go ahead and proceed with endoscopic nasal and sinus surgery after cardiac clearance. Reason For Visit  Pt here for evaluation of nasal polyp. Allergies  Codeine Derivatives. Current Meds  Aspirin EC 81 MG Oral Tablet Delayed Release;; RPT Diltiazem HCl TABS;; RPT Metoprolol Tartrate TABS;; RPT Warfarin Sodium TABS;; RPT Symbicort AERO;; RPT. Active Problems  Asthma   (493.90) (J45.909) Disease of nasal cavity and sinuses   (664.40,347.9) (J39.9) Hypertension   (401.9) (I10) Hypertrophy of nasal turbinates   (478.0) (J34.3) Nasal cavity polyp   (471.0) (J33.0). PSH  Mitral Valve Replacement 1998 Oral Surgery. Family Hx  Acute Myocardial Infarction: Father (V17.3) Cancer: Mother Hypertension: Mother (V14.49) Hypertension: Father (V17.49). Personal Hx  Caffeine use (305.90) (F15.929) Former smoker (V15.82) [phone]) Marital History - Currently Married Never Drank Alcohol. Signature  Electronically signed by : Serena Colonel  M.D.; 09/19/2012 9:36 AM EST.

## 2012-10-10 NOTE — Patient Instructions (Addendum)
8/7--Do not take your coumadin today you INR is elevated 8/8--Do not take coumadin or Lovenox  8/9- Lovenox 80 mg 8 am and 8 pm 8/10 Lovenox 80 mg 8 am and 8 pm 8/11 Lovenox 80 mg 8 am and 8 pm 8/12 Lovenox 80 mg 8 am only (no evening dose) 8/13 no coumadin or Lovenox prior to procedure  When Dr Enrigue Catena states its OK to start your coumadin back start your lovenox back also twice a day, take an extra 1/2 tablet of coumadin for the  first 2 days to boost your INR up Script for Lovenox sent to [facility] at [place] Patient states he has done Lovenox injections before and is comfortable doing them

## 2012-10-11 ENCOUNTER — Ambulatory Visit
Admission: RE | Admit: 2012-10-11 | Discharge: 2012-10-11 | Disposition: A | Discharge status: Home / Self Care | Payer: BC Managed Care – PPO | Source: Ambulatory Visit | Attending: Otolaryngology | Admitting: Otolaryngology

## 2012-10-11 ENCOUNTER — Other Ambulatory Visit: Payer: Self-pay | Admitting: Otolaryngology

## 2012-10-11 DIAGNOSIS — R9389 Abnormal findings on diagnostic imaging of other specified body structures: Secondary | ICD-10-CM

## 2012-10-11 NOTE — Progress Notes (Signed)
Anesthesia Chart Review:  Patient is a 62 year old male scheduled for nasal septoplasty, reduction of turbinates, nasal polypectomy, endoscopic sinus surgery on 10/16/12 by Dr. Pollyann Kennedy.  History includes chronic pansinusitis and nasal polyps, HTN, former smoker, CHF, MV replacement (St. Jude) '98 with normal coronaries at that time, asthma, dyslipidemia, anxiety, jaw surgery.    Cardiologist is Dr. Jens Som, who is aware of plans for surgery and recommended a Lovenox bridge.    EKG on 10/10/12 showed NSR, LVH with QRS widening.  Echo on 02/25/10 showed: - Left ventricle: There is mild septal dyssynergy. The EF is in the 55% range. The cavity size was normal. Wall thickness was normal. - Mitral valve: The mechanical mitral prosthesis is working well. Valve area by pressure half-time: 2.14cm^2. Valve area by continuity equation (using LVOT flow): 1.79cm^2. - Left atrium: The atrium was mildly dilated. - Tricuspid valve: Mild regurgitation.  CXR on 10/10/12 showed: Nodular opacity seen posteriorly and superiorly on the lateral view only. This finding warrants correlation with noncontrast enhanced chest CT to further assess. Elsewhere, lungs appear clear. The patient is status post mitral valve replacement.  Results were reviewed by Dr. Pollyann Kennedy who ordered a chest CT that was done today and showed: No nodule superiorly and posteriorly as suggested on the prior lateral chest x-ray. There is a small 5 mm nodule along the posterior lateral right middle lobe near the major fissure. If the patient is at high risk for bronchogenic carcinoma, follow-up chest CT at 6-12 months is recommended. If the patient is at low risk for bronchogenic carcinoma, follow-up chest CT at 12 months is recommended. Will defer follow-up recommendations to the ordering physician.  Preoperative labs noted.  He was suppose to get Lovenox teaching yesterday. He will need a repeat PT/PTT on the day of surgery. If follow-up lab results are  reasonable then I would anticipate that he could proceed as planned.  Velna Ochs [facility]/Anesthesiology Phone [phone] 10/11/2012 2:04 PM

## 2012-10-15 MED ORDER — CEFAZOLIN SODIUM-DEXTROSE 2-3 GM-% IV SOLR
2.0000 g | INTRAVENOUS | Status: AC
  Administered 2012-10-16: 2 g via INTRAVENOUS
  Filled 2012-10-15: qty 50

## 2012-10-16 ENCOUNTER — Encounter (HOSPITAL_COMMUNITY)
Admission: RE | Disposition: A | Discharge status: Home / Self Care | Payer: Self-pay | Source: Ambulatory Visit | Attending: Otolaryngology

## 2012-10-16 ENCOUNTER — Ambulatory Visit (HOSPITAL_COMMUNITY)
Admission: RE | Admit: 2012-10-16 | Discharge: 2012-10-16 | Disposition: A | Discharge status: Home / Self Care | Payer: BC Managed Care – PPO | Source: Ambulatory Visit | Attending: Otolaryngology | Admitting: Otolaryngology

## 2012-10-16 ENCOUNTER — Encounter (HOSPITAL_COMMUNITY): Payer: Self-pay | Admitting: Vascular Surgery

## 2012-10-16 ENCOUNTER — Encounter (HOSPITAL_COMMUNITY): Payer: Self-pay | Admitting: *Deleted

## 2012-10-16 ENCOUNTER — Ambulatory Visit (HOSPITAL_COMMUNITY): Payer: BC Managed Care – PPO | Admitting: Anesthesiology

## 2012-10-16 DIAGNOSIS — J329 Chronic sinusitis, unspecified: Secondary | ICD-10-CM

## 2012-10-16 DIAGNOSIS — Z7901 Long term (current) use of anticoagulants: Secondary | ICD-10-CM | POA: Insufficient documentation

## 2012-10-16 DIAGNOSIS — Z87891 Personal history of nicotine dependence: Secondary | ICD-10-CM | POA: Insufficient documentation

## 2012-10-16 DIAGNOSIS — R011 Cardiac murmur, unspecified: Secondary | ICD-10-CM | POA: Insufficient documentation

## 2012-10-16 DIAGNOSIS — J330 Polyp of nasal cavity: Secondary | ICD-10-CM | POA: Insufficient documentation

## 2012-10-16 DIAGNOSIS — Z954 Presence of other heart-valve replacement: Secondary | ICD-10-CM | POA: Insufficient documentation

## 2012-10-16 DIAGNOSIS — J45909 Unspecified asthma, uncomplicated: Secondary | ICD-10-CM | POA: Insufficient documentation

## 2012-10-16 DIAGNOSIS — I10 Essential (primary) hypertension: Secondary | ICD-10-CM | POA: Insufficient documentation

## 2012-10-16 DIAGNOSIS — J342 Deviated nasal septum: Secondary | ICD-10-CM | POA: Insufficient documentation

## 2012-10-16 DIAGNOSIS — J320 Chronic maxillary sinusitis: Secondary | ICD-10-CM | POA: Insufficient documentation

## 2012-10-16 DIAGNOSIS — Z7982 Long term (current) use of aspirin: Secondary | ICD-10-CM | POA: Insufficient documentation

## 2012-10-16 DIAGNOSIS — J322 Chronic ethmoidal sinusitis: Secondary | ICD-10-CM | POA: Insufficient documentation

## 2012-10-16 DIAGNOSIS — Z79899 Other long term (current) drug therapy: Secondary | ICD-10-CM | POA: Insufficient documentation

## 2012-10-16 DIAGNOSIS — F411 Generalized anxiety disorder: Secondary | ICD-10-CM | POA: Insufficient documentation

## 2012-10-16 DIAGNOSIS — Z885 Allergy status to narcotic agent status: Secondary | ICD-10-CM | POA: Insufficient documentation

## 2012-10-16 DIAGNOSIS — J343 Hypertrophy of nasal turbinates: Secondary | ICD-10-CM | POA: Insufficient documentation

## 2012-10-16 DIAGNOSIS — I509 Heart failure, unspecified: Secondary | ICD-10-CM | POA: Insufficient documentation

## 2012-10-16 HISTORY — PX: NASAL SINUS SURGERY: SHX719

## 2012-10-16 LAB — APTT: aPTT: 30 seconds (ref 24–37)

## 2012-10-16 LAB — PROTIME-INR: INR: 1.06 (ref 0.00–1.49)

## 2012-10-16 SURGERY — SINUS SURGERY, ENDOSCOPIC
Anesthesia: General | Site: Nose | Wound class: Clean Contaminated

## 2012-10-16 MED ORDER — ONDANSETRON HCL 4 MG/2ML IJ SOLN
INTRAMUSCULAR | Status: DC | PRN
  Administered 2012-10-16: 4 mg via INTRAVENOUS

## 2012-10-16 MED ORDER — OXYCODONE HCL 5 MG PO TABS: 5.0000 mg | ORAL_TABLET | Freq: Once | ORAL | Status: DC | PRN

## 2012-10-16 MED ORDER — LIDOCAINE-EPINEPHRINE 1 %-1:100000 IJ SOLN
INTRAMUSCULAR | Status: DC | PRN
  Administered 2012-10-16: 20 mL

## 2012-10-16 MED ORDER — GLYCOPYRROLATE 0.2 MG/ML IJ SOLN
INTRAMUSCULAR | Status: DC | PRN
  Administered 2012-10-16: 0.4 mg via INTRAVENOUS

## 2012-10-16 MED ORDER — OXYMETAZOLINE HCL 0.05 % NA SOLN
NASAL | Status: DC | PRN
  Administered 2012-10-16: 1 via NASAL

## 2012-10-16 MED ORDER — PROPOFOL 10 MG/ML IV BOLUS
INTRAVENOUS | Status: DC | PRN
  Administered 2012-10-16: 200 mg via INTRAVENOUS

## 2012-10-16 MED ORDER — OXYCODONE HCL 5 MG/5ML PO SOLN: 5.0000 mg | Freq: Once | ORAL | Status: DC | PRN

## 2012-10-16 MED ORDER — 0.9 % SODIUM CHLORIDE (POUR BTL) OPTIME
TOPICAL | Status: DC | PRN
  Administered 2012-10-16: 1000 mL

## 2012-10-16 MED ORDER — PROMETHAZINE HCL 25 MG RE SUPP: 25.0000 mg | Freq: Four times a day (QID) | RECTAL | Status: DC | PRN

## 2012-10-16 MED ORDER — MIDAZOLAM HCL 5 MG/5ML IJ SOLN
INTRAMUSCULAR | Status: DC | PRN
  Administered 2012-10-16: 2 mg via INTRAVENOUS

## 2012-10-16 MED ORDER — LIDOCAINE HCL (CARDIAC) 20 MG/ML IV SOLN
INTRAVENOUS | Status: DC | PRN
  Administered 2012-10-16: 80 mg via INTRAVENOUS

## 2012-10-16 MED ORDER — ONDANSETRON HCL 4 MG/2ML IJ SOLN
INTRAMUSCULAR | Status: AC
  Filled 2012-10-16: qty 2

## 2012-10-16 MED ORDER — NEOSTIGMINE METHYLSULFATE 1 MG/ML IJ SOLN
INTRAMUSCULAR | Status: DC | PRN
  Administered 2012-10-16: 3 mg via INTRAVENOUS

## 2012-10-16 MED ORDER — ROCURONIUM BROMIDE 100 MG/10ML IV SOLN
INTRAVENOUS | Status: DC | PRN
  Administered 2012-10-16: 40 mg via INTRAVENOUS

## 2012-10-16 MED ORDER — OXYMETAZOLINE HCL 0.05 % NA SOLN
NASAL | Status: AC
  Filled 2012-10-16: qty 15

## 2012-10-16 MED ORDER — SODIUM CHLORIDE 0.9 % IR SOLN
Status: DC | PRN
  Administered 2012-10-16: 1000 mL

## 2012-10-16 MED ORDER — HYDROMORPHONE HCL PF 1 MG/ML IJ SOLN: 0.2500 mg | INTRAMUSCULAR | Status: DC | PRN

## 2012-10-16 MED ORDER — BACITRACIN ZINC 500 UNIT/GM EX OINT
TOPICAL_OINTMENT | CUTANEOUS | Status: DC | PRN
  Administered 2012-10-16: 1 via TOPICAL

## 2012-10-16 MED ORDER — ONDANSETRON HCL 4 MG/2ML IJ SOLN
4.0000 mg | Freq: Four times a day (QID) | INTRAMUSCULAR | Status: AC | PRN
  Administered 2012-10-16: 4 mg via INTRAVENOUS

## 2012-10-16 MED ORDER — HYDROCODONE-ACETAMINOPHEN 7.5-325 MG PO TABS: 1.0000 | ORAL_TABLET | Freq: Four times a day (QID) | ORAL | Status: DC | PRN

## 2012-10-16 MED ORDER — LACTATED RINGERS IV SOLN
INTRAVENOUS | Status: DC
  Administered 2012-10-16 (×2): via INTRAVENOUS

## 2012-10-16 MED ORDER — SUFENTANIL CITRATE 50 MCG/ML IV SOLN
INTRAVENOUS | Status: DC | PRN
  Administered 2012-10-16 (×2): 10 ug via INTRAVENOUS

## 2012-10-16 MED ORDER — LIDOCAINE-EPINEPHRINE 1 %-1:100000 IJ SOLN
INTRAMUSCULAR | Status: AC
  Filled 2012-10-16: qty 1

## 2012-10-16 MED ORDER — CEPHALEXIN 500 MG PO CAPS: 500.0000 mg | ORAL_CAPSULE | Freq: Three times a day (TID) | ORAL | Status: DC

## 2012-10-16 MED ORDER — BACITRACIN ZINC 500 UNIT/GM EX OINT
TOPICAL_OINTMENT | CUTANEOUS | Status: AC
  Filled 2012-10-16: qty 15

## 2012-10-16 SURGICAL SUPPLY — 46 items
ATTRACTOMAT 16X20 MAGNETIC DRP (DRAPES) IMPLANT
BLADE RAD40 ROTATE 4M 4 5PK (BLADE) IMPLANT
BLADE RAD60 ROTATE M4 4 5PK (BLADE) IMPLANT
BLADE SURG 11 STRL SS (BLADE) ×2 IMPLANT
BLADE TRICUT ROTATE M4 4 5PK (BLADE) IMPLANT
CANISTER SUCTION 2500CC (MISCELLANEOUS) ×5 IMPLANT
CLOTH BEACON ORANGE TIMEOUT ST (SAFETY) ×3 IMPLANT
COAGULATOR SUCT SWTCH 10FR 6 (ELECTROSURGICAL) IMPLANT
CORDS BIPOLAR (ELECTRODE) ×2 IMPLANT
CRADLE DONUT ADULT HEAD (MISCELLANEOUS) ×3 IMPLANT
DRESSING NASAL KENNEDY 3.5X.9 (MISCELLANEOUS) ×1 IMPLANT
DRESSING TELFA 8X3 (GAUZE/BANDAGES/DRESSINGS) ×5 IMPLANT
DRSG NASAL KENNEDY 3.5X.9 (MISCELLANEOUS) ×3
DRSG NASOPORE 8CM (GAUZE/BANDAGES/DRESSINGS) ×4 IMPLANT
ELECT REM PT RETURN 9FT ADLT (ELECTROSURGICAL) ×3
ELECTRODE REM PT RTRN 9FT ADLT (ELECTROSURGICAL) ×1 IMPLANT
FILTER ARTHROSCOPY CONVERTOR (FILTER) ×2 IMPLANT
GAUZE SPONGE 2X2 8PLY STRL LF (GAUZE/BANDAGES/DRESSINGS) ×1 IMPLANT
GLOVE BIO SURGEON STRL SZ 6.5 (GLOVE) ×2 IMPLANT
GLOVE BIOGEL PI IND STRL 6.5 (GLOVE) ×1 IMPLANT
GLOVE BIOGEL PI INDICATOR 6.5 (GLOVE) ×1
GLOVE ECLIPSE 7.5 STRL STRAW (GLOVE) ×6 IMPLANT
GOWN STRL NON-REIN LRG LVL3 (GOWN DISPOSABLE) ×4 IMPLANT
GOWN STRL REIN XL XLG (GOWN DISPOSABLE) ×2 IMPLANT
KIT BASIN OR (CUSTOM PROCEDURE TRAY) ×3 IMPLANT
KIT ROOM TURNOVER OR (KITS) ×3 IMPLANT
NEEDLE 27GAX1X1/2 (NEEDLE) ×3 IMPLANT
NS IRRIG 1000ML POUR BTL (IV SOLUTION) ×3 IMPLANT
PAD ARMBOARD 7.5X6 YLW CONV (MISCELLANEOUS) ×6 IMPLANT
PATTIES SURGICAL .5 X3 (DISPOSABLE) ×3 IMPLANT
SHEATH ENDOSCRUB 0 DEG (SHEATH) ×2 IMPLANT
SHEATH ENDOSCRUB 30 DEG (SHEATH) IMPLANT
SPECIMEN JAR SMALL (MISCELLANEOUS) ×3 IMPLANT
SPONGE GAUZE 2X2 STER 10/PKG (GAUZE/BANDAGES/DRESSINGS)
SUT CHROMIC 4 0 P 3 18 (SUTURE) ×3 IMPLANT
SUT ETHILON 3 0 PS 1 (SUTURE) ×2 IMPLANT
SUT PLAIN 4 0 ~~LOC~~ 1 (SUTURE) ×3 IMPLANT
SWAB COLLECTION DEVICE MRSA (MISCELLANEOUS) IMPLANT
SYR 50ML SLIP (SYRINGE) IMPLANT
TOWEL OR 17X24 6PK STRL BLUE (TOWEL DISPOSABLE) ×3 IMPLANT
TOWEL OR 17X26 10 PK STRL BLUE (TOWEL DISPOSABLE) ×3 IMPLANT
TRAY ENT MC OR (CUSTOM PROCEDURE TRAY) ×3 IMPLANT
TUBE ANAEROBIC SPECIMEN COL (MISCELLANEOUS) IMPLANT
TUBE CONNECTING 12X1/4 (SUCTIONS) ×4 IMPLANT
TUBING EXTENTION W/L.L. (IV SETS) ×3 IMPLANT
WATER STERILE IRR 1000ML POUR (IV SOLUTION) ×3 IMPLANT

## 2012-10-16 NOTE — Preoperative (Signed)
Beta Blockers   Reason not to administer Beta Blockers:Metoprolol taken at 0730 hrs on 10/16/12

## 2012-10-16 NOTE — Progress Notes (Signed)
Patient noted to be having excessive bleeding from nares, with noted blood in mouth and spitting of blood.  Called Dr. Pollyann Kennedy and spoke with Jasmine December RN there regarding order for patient to resume coumadin tomorrow.  Told by Chardonnay Holzmann that patient is to NOT take coumadin dose until they see Dr. Pollyann Kennedy tomorrow.  Also voiced concern for patients excessive bleeding ( changing drip pad every 15 mintues)  .  Will expect return phone call from Dr. Annalee Genta

## 2012-10-16 NOTE — Anesthesia Preprocedure Evaluation (Signed)
Anesthesia Evaluation  Patient identified by MRN, date of birth, ID band Patient awake    Reviewed: Allergy & Precautions, H&P , NPO status , Patient's Chart, lab work & pertinent test results  Airway Mallampati: II  Neck ROM: full    Dental   Pulmonary asthma , former smoker,          Cardiovascular hypertension, +CHF + Valvular Problems/Murmurs  S/p MVR   Neuro/Psych Anxiety    GI/Hepatic   Endo/Other    Renal/GU      Musculoskeletal   Abdominal   Peds  Hematology   Anesthesia Other Findings   Reproductive/Obstetrics                           Anesthesia Physical Anesthesia Plan  ASA: III  Anesthesia Plan: General   Post-op Pain Management:    Induction: Intravenous  Airway Management Planned: Oral ETT  Additional Equipment:   Intra-op Plan:   Post-operative Plan: Extubation in OR  Informed Consent: I have reviewed the patients History and Physical, chart, labs and discussed the procedure including the risks, benefits and alternatives for the proposed anesthesia with the patient or authorized representative who has indicated his/her understanding and acceptance.     Plan Discussed with: CRNA, Anesthesiologist and Surgeon  Anesthesia Plan Comments:         Anesthesia Quick Evaluation

## 2012-10-16 NOTE — Anesthesia Postprocedure Evaluation (Signed)
Anesthesia Post Note  Patient: Corey Mathis  Procedure(s) Performed: Procedure(s) (LRB): NASAL ENDOSCOPIC POLYPECTOMY/MAXILLARY ANTROSTOMY/ETHMOIDECTOMY (N/A)  Anesthesia type: General  Patient location: PACU  Post pain: Pain level controlled and Adequate analgesia  Post assessment: Post-op Vital signs reviewed, Patient's Cardiovascular Status Stable, Respiratory Function Stable, Patent Airway and Pain level controlled  Last Vitals:  Filed Vitals:   10/16/12 0907  BP: 141/90  Pulse: 70  Temp: 36.4 C  Resp: 20    Post vital signs: Reviewed and stable  Level of consciousness: awake, alert  and oriented  Complications: No apparent anesthesia complications

## 2012-10-16 NOTE — Interval H&P Note (Signed)
History and Physical Interval Note:  10/16/2012 10:39 AM  Corey Mathis  has presented today for surgery, with the diagnosis of chronic sinusitis and nasal polyps  The various methods of treatment have been discussed with the patient and family. After consideration of risks, benefits and other options for treatment, the patient has consented to  Procedure(s): NASAL SEPTOPLASTY WITH BILATERAL TURBINATE REDUCTION (Bilateral) NASAL ENDOSCOPIC POLYPECTOMY/MAXILLARY ANTROSTOMY/ETHMOIDECTOMY/FRONTAL SINUS SURGERY (N/A) as a surgical intervention .  The patient's history has been reviewed, patient examined, no change in status, stable for surgery.  I have reviewed the patient's chart and labs.  Questions were answered to the patient's satisfaction.     [name]

## 2012-10-16 NOTE — Op Note (Signed)
OPERATIVE REPORT  DATE OF SURGERY: 10/16/2012  PATIENT:  Corey Mathis,  62 y.o. male  PRE-OPERATIVE DIAGNOSIS:  chronic sinusitis and nasal polyps  POST-OPERATIVE DIAGNOSIS:  chronic sinusitis and nasal polyps  PROCEDURE:  Procedure(s): NASAL ENDOSCOPIC POLYPECTOMY/MAXILLARY ANTROSTOMY/ETHMOIDECTOMY  SURGEON:  Susy Frizzle, MD  ASSISTANTS: none  ANESTHESIA:   General   EBL:  350 ml  DRAINS: none  LOCAL MEDICATIONS USED:  1% Xylocaine with epinephrine  SPECIMEN:  Bilateral nasal and sinus contents  COUNTS:  Correct  PROCEDURE DETAILS: The patient was taken to the operating room and placed on the operating table in the supine position. Following induction of general endotracheal anesthesia, the face was draped in a standard fashion. Afrin spray was used preoperatively in the nasal cavities. 1% Xylocaine with epinephrine was infiltrated into the polypoid mass bilaterally, and the superior and posterior attachments of the middle turbinates. The septum was infiltrated as well. There is a leftward septal deviation up highbut it didn't interfere with the nasal airway inferiorly nor did it interfere with access to the infundibulum bilaterally. Septoplasty was therefore not performed.  Bilateral endoscopic polypectomy. Using a 0 endoscope and a microdebrider, the polypoid mass was cleaned out bilaterally from the nasal cavities using the microdebrider. Some large pieces were also taken out using Wilde-Blakesley forceps. There is extensive polypoid disease bilaterally emanating from the middle and superior meatus. The left side was worse. After the polypectomy was completed, the nasal airways were open and clear.  Bilateral endoscopic total ethmoidectomy. Using a combination of 0 and 30 nasal endoscopes, a complete ethmoid dissection was performed bilaterally. The right middle turbinate was preserved. The left middle turbinate was partially resected to facilitate exposure. Complete  ethmoid dissection was accomplished bilaterally. There was extensive polypoid disease present throughout all anterior cells and the majority of the posterior cells. The lamina papyracea was the lateral limit of dissection was kept intact bilaterally. The Foley was kept intact superiorly. Frontal recess was cleaned of polypoid tissue as well but there is really no formally developed frontal sinuses.  Bilateral endoscopic maxillary antrostomy with stripping of tissue. Using the 30 scope and curved suction the fontanelle was inspected bilaterally. The mastoid sinuses were entered using the suction, there was dense of purulent material and present on the left side. This was all suctioned out. Antrostomies were enlarged anteriorly using backbiting forceps and posteriorly using the through cut forceps. Polypoid disease was cleaned out of the maxillary antra bilaterally using angled forceps.  The ethmoid cavities were packed bilaterally with NasalPore. The nasal cavities were packed with rolled up Telfa with bacitracin. Bipolar cautery was used along the posterior septum on the left side for hemostasis. The pharynx was suctioned of blood and secretions. Patient was then awakened, extubated and transferred to recovery in stable condition .    PATIENT DISPOSITION:  To PACU, stable

## 2012-10-16 NOTE — Anesthesia Procedure Notes (Signed)
Procedure Name: Intubation Date/Time: 10/16/2012 11:25 AM Performed by: Charm Barges, Weda Baumgarner R Pre-anesthesia Checklist: Emergency Drugs available, Patient identified, Timeout performed, Suction available and Patient being monitored Patient Re-evaluated:Patient Re-evaluated prior to inductionOxygen Delivery Method: Circle system utilized Preoxygenation: Pre-oxygenation with 100% oxygen Intubation Type: IV induction Ventilation: Mask ventilation without difficulty Laryngoscope Size: Mac and 4 Grade View: Grade II Tube type: Oral Rae Tube size: 7.5 mm Number of attempts: 3 Placement Confirmation: ETT inserted through vocal cords under direct vision,  positive ETCO2 and breath sounds checked- equal and bilateral Secured at: 21 cm Tube secured with: Tape Dental Injury: Teeth and Oropharynx as per pre-operative assessment

## 2012-10-16 NOTE — Progress Notes (Signed)
Dr. Annalee Genta returned phone call.  Patient asking to remain in hospital over night and this was relayed to Dr. Annalee Genta.  He did not feel that patient should remain in hospital overnight.  OK for patient to be discharged to home with bleeding.

## 2012-10-16 NOTE — Progress Notes (Signed)
Drank coke and tolerateed well w/o nausea

## 2012-10-16 NOTE — Transfer of Care (Signed)
Immediate Anesthesia Transfer of Care Note  Patient: Corey Mathis  Procedure(s) Performed: Procedure(s): NASAL ENDOSCOPIC POLYPECTOMY/MAXILLARY ANTROSTOMY/ETHMOIDECTOMY (N/A)  Patient Location: PACU  Anesthesia Type:General  Level of Consciousness: awake, alert  and patient cooperative  Airway & Oxygen Therapy: Patient Spontanous Breathing and Patient connected to face mask oxygen  Post-op Assessment: Report given to PACU RN  Post vital signs: Reviewed and stable  Complications: No apparent anesthesia complications

## 2012-10-16 NOTE — Progress Notes (Signed)
Consulted with dr Pollyann Kennedy regarding pts constant bloody nasa darinage drip pad changed every 15-30 minutes no new orders obtained order remains to d/c home

## 2012-10-17 ENCOUNTER — Encounter (HOSPITAL_COMMUNITY): Payer: Self-pay | Admitting: Otolaryngology

## 2012-10-17 ENCOUNTER — Other Ambulatory Visit: Payer: Self-pay | Admitting: *Deleted

## 2012-10-17 ENCOUNTER — Telehealth: Payer: Self-pay | Admitting: *Deleted

## 2012-10-17 ENCOUNTER — Telehealth: Payer: Self-pay

## 2012-10-17 MED ORDER — HYDROCHLOROTHIAZIDE 12.5 MG PO CAPS: 12.5000 mg | ORAL_CAPSULE | Freq: Every day | ORAL | Status: DC

## 2012-10-17 MED ORDER — ALBUTEROL SULFATE HFA 108 (90 BASE) MCG/ACT IN AERS: 2.0000 | INHALATION_SPRAY | Freq: Four times a day (QID) | RESPIRATORY_TRACT | Status: DC | PRN

## 2012-10-17 NOTE — Telephone Encounter (Signed)
Sent to [facility] as they manage

## 2012-10-17 NOTE — Telephone Encounter (Signed)
Calls pt: I looked at his labs adn chest CT report this evening.  Since he is a former smoker rec repeat CT of chest in 6 mo to follow up the nodule seen.

## 2012-10-17 NOTE — Telephone Encounter (Signed)
Spoke with pt, instructed to follow MD instructions to start coumadin back tomorrow, resume coumadin and lovenox as directed before, rov 10/22/2012

## 2012-10-17 NOTE — Telephone Encounter (Signed)
Pt calling to get refill of albuterol. And to see if Dr.Metheney got the results of his CXR and labs. I told him that those results are in his chart and that I would make her aware of them.Loralee Pacas [place]

## 2012-10-17 NOTE — Telephone Encounter (Signed)
Dr. Pollyann Kennedy wants to continue to hold coumadin until tomorrow.  Patient had sinus surgery on 08.13.2014.  Pt was supposed to start back today.

## 2012-10-18 ENCOUNTER — Telehealth: Payer: Self-pay | Admitting: *Deleted

## 2012-10-18 MED ORDER — ALBUTEROL SULFATE HFA 108 (90 BASE) MCG/ACT IN AERS: 2.0000 | INHALATION_SPRAY | Freq: Four times a day (QID) | RESPIRATORY_TRACT | Status: DC | PRN

## 2012-10-18 NOTE — Telephone Encounter (Signed)
Pt informed that Dr.Metheney did review his labs and CT and recommended that he repeats this in 6 mos.Loralee Pacas [place]

## 2012-10-22 ENCOUNTER — Ambulatory Visit (INDEPENDENT_AMBULATORY_CARE_PROVIDER_SITE_OTHER): Payer: BC Managed Care – PPO | Admitting: *Deleted

## 2012-10-22 DIAGNOSIS — Z9889 Other specified postprocedural states: Secondary | ICD-10-CM

## 2012-10-22 DIAGNOSIS — I059 Rheumatic mitral valve disease, unspecified: Secondary | ICD-10-CM

## 2012-10-22 DIAGNOSIS — Z7901 Long term (current) use of anticoagulants: Secondary | ICD-10-CM

## 2012-11-01 ENCOUNTER — Ambulatory Visit (INDEPENDENT_AMBULATORY_CARE_PROVIDER_SITE_OTHER): Payer: BC Managed Care – PPO | Admitting: Pharmacist

## 2012-11-01 DIAGNOSIS — Z7901 Long term (current) use of anticoagulants: Secondary | ICD-10-CM

## 2012-11-01 DIAGNOSIS — Z9889 Other specified postprocedural states: Secondary | ICD-10-CM

## 2012-11-01 DIAGNOSIS — I059 Rheumatic mitral valve disease, unspecified: Secondary | ICD-10-CM

## 2012-11-01 LAB — POCT INR: INR: 2.2

## 2012-11-20 ENCOUNTER — Ambulatory Visit (INDEPENDENT_AMBULATORY_CARE_PROVIDER_SITE_OTHER): Payer: BC Managed Care – PPO | Admitting: *Deleted

## 2012-11-20 DIAGNOSIS — Z9889 Other specified postprocedural states: Secondary | ICD-10-CM

## 2012-11-20 DIAGNOSIS — Z7901 Long term (current) use of anticoagulants: Secondary | ICD-10-CM

## 2012-11-20 DIAGNOSIS — I059 Rheumatic mitral valve disease, unspecified: Secondary | ICD-10-CM

## 2012-11-20 LAB — POCT INR: INR: 4

## 2012-12-06 ENCOUNTER — Ambulatory Visit (INDEPENDENT_AMBULATORY_CARE_PROVIDER_SITE_OTHER): Payer: BC Managed Care – PPO | Admitting: *Deleted

## 2012-12-06 ENCOUNTER — Ambulatory Visit (INDEPENDENT_AMBULATORY_CARE_PROVIDER_SITE_OTHER): Payer: BC Managed Care – PPO | Admitting: Cardiology

## 2012-12-06 ENCOUNTER — Encounter: Payer: Self-pay | Admitting: Cardiology

## 2012-12-06 VITALS — BP 133/89 | HR 77 | Ht 76.0 in | Wt 195.0 lb

## 2012-12-06 DIAGNOSIS — I059 Rheumatic mitral valve disease, unspecified: Secondary | ICD-10-CM

## 2012-12-06 DIAGNOSIS — Z9889 Other specified postprocedural states: Secondary | ICD-10-CM

## 2012-12-06 DIAGNOSIS — I10 Essential (primary) hypertension: Secondary | ICD-10-CM

## 2012-12-06 DIAGNOSIS — E785 Hyperlipidemia, unspecified: Secondary | ICD-10-CM

## 2012-12-06 DIAGNOSIS — Z7901 Long term (current) use of anticoagulants: Secondary | ICD-10-CM

## 2012-12-06 LAB — POCT INR: INR: 2.8

## 2012-12-06 NOTE — Assessment & Plan Note (Signed)
Continue SBE prophylaxis. Continue Coumadin and aspirin. Hemoglobin monitored by primary care. Repeat echocardiogram.

## 2012-12-06 NOTE — Assessment & Plan Note (Signed)
Continue statin. Lipids and liver monitored by primary care. 

## 2012-12-06 NOTE — Patient Instructions (Addendum)

## 2012-12-06 NOTE — Progress Notes (Signed)
HPI: Corey Mathis is a very pleasant gentleman who is status post mitral valve replacement with a St. Jude valve in 1998. A pre-operative cardiac catheterization revealed normal coronary arteries. The last echocardiogram in Dec 2011 showed normal LV function. The left atrium was mildly dilated. 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 I last saw him in Dec 2011. Since I last saw him the patient has dyspnea with more extreme activities but not with routine activities. It is relieved with rest. It is not associated with chest pain. There is no orthopnea, PND or pedal edema. There is no syncope or palpitations. There is no exertional chest pain.    Current Outpatient Prescriptions  Medication Sig Dispense Refill  . albuterol (PROAIR HFA) 108 (90 BASE) MCG/ACT inhaler Inhale 2 puffs into the lungs every 6 (six) hours as needed for wheezing.  1 Inhaler  3  . aspirin 81 MG EC tablet Take 81 mg by mouth daily.       Marland Kitchen atorvastatin (LIPITOR) 20 MG tablet Take 20 mg by mouth daily.      . budesonide-formoterol (SYMBICORT) 160-4.5 MCG/ACT inhaler Inhale 2 puffs into the lungs 2 (two) times daily. INHALE TWO PUFFS BY MOUTH TWICE DAILY      . Calcium-Vitamin D-Vitamin K (CALCIUM + D) [phone] MG-UNT-MCG CHEW Chew 1 tablet by mouth daily.      Marland Kitchen diltiazem (CARDIZEM CD) 240 MG 24 hr capsule Take 240 mg by mouth daily. TAKE ONE CAPSULE BY MOUTH EVERY DAY      . ferrous sulfate 325 (65 FE) MG tablet Take 325 mg by mouth daily with breakfast.      . hydrochlorothiazide (MICROZIDE) 12.5 MG capsule Take 1 capsule (12.5 mg total) by mouth daily.  90 capsule  3  . metoprolol (LOPRESSOR) 50 MG tablet Take 50 mg by mouth 2 (two) times daily. TAKE ONE TABLET BY MOUTH TWICE DAILY      . Multiple Vitamin (MULTIVITAMIN) tablet Take 1 tablet by mouth daily.      . Omega-3 Fatty Acids (FISH OIL) 1000 MG CAPS Take 1 capsule by mouth daily.      Marland Kitchen OVER THE COUNTER MEDICATION Co q 10 gummies.      Marland Kitchen terazosin (HYTRIN)  2 MG capsule Take 2 mg by mouth at bedtime.      Marland Kitchen warfarin (COUMADIN) 5 MG tablet Take 10-12.5 mg by mouth daily. Take 2 Tablets= 10 mg on Monday, Tuesday, Thursday, Friday, Saturday, and Sunday.  Take 2 1/2 Tablets= 12.5 on Wednesday.       No current facility-administered medications for this visit.     Past Medical History  Diagnosis Date  . Labyrinthitis     hx  . Asthma   . Anxiety   . Allergy history unknown   . Mitral valve replaced     hx  . Dyslipidemia   . HTN (hypertension)   . Allergy   . CHF (congestive heart failure)     Past Surgical History  Procedure Laterality Date  . Jaw surgery (other)    . Acne cyst removal      from hand and back  . Valve replacement  1998    St. Jude, mitral  . Nasal sinus surgery N/A 10/16/2012    Procedure: NASAL ENDOSCOPIC POLYPECTOMY/MAXILLARY ANTROSTOMY/ETHMOIDECTOMY;  Surgeon: Serena Colonel, MD;  Location: [facility];  Service: ENT;  Laterality: N/A;    History   Social History  . Marital Status: Married  Spouse Name: Corey Mathis     Number of Children: 2  . 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 Smoking status: Former Smoker    Quit date: 04/26/1991  . Smokeless tobacco: Never Used  . Alcohol Use: No  . Drug Use: No  . Sexual Activity: Not on file   Other Topics Concern  . Not on file   Social History Narrative   Some exercise, bike and walking.  2 cups per day.     ROS: no fevers or chills, productive cough, hemoptysis, dysphasia, odynophagia, melena, hematochezia, dysuria, hematuria, rash, seizure activity, orthopnea, PND, pedal edema, claudication. Remaining systems are negative.  Physical Exam: Well-developed well-nourished in no acute distress.  Skin is warm and dry.  HEENT is normal.  Neck is supple.  Chest is clear to auscultation with normal expansion.  Cardiovascular exam is regular rate and rhythm. Crisp mechanical valve sounds, and no  murmur. Abdominal exam nontender or distended. No masses palpated. Extremities show no edema. neuro grossly intact  ECG sinus rhythm at a rate of 71. First degree AV block. Left ventricular hypertrophy.

## 2012-12-06 NOTE — Assessment & Plan Note (Signed)
Blood pressure controlled. Continue present medications. 

## 2012-12-24 ENCOUNTER — Ambulatory Visit (INDEPENDENT_AMBULATORY_CARE_PROVIDER_SITE_OTHER): Payer: BC Managed Care – PPO | Admitting: *Deleted

## 2012-12-24 ENCOUNTER — Ambulatory Visit (HOSPITAL_COMMUNITY): Payer: BC Managed Care – PPO | Attending: Cardiology

## 2012-12-24 DIAGNOSIS — Z87891 Personal history of nicotine dependence: Secondary | ICD-10-CM | POA: Insufficient documentation

## 2012-12-24 DIAGNOSIS — I10 Essential (primary) hypertension: Secondary | ICD-10-CM | POA: Insufficient documentation

## 2012-12-24 DIAGNOSIS — I509 Heart failure, unspecified: Secondary | ICD-10-CM | POA: Insufficient documentation

## 2012-12-24 DIAGNOSIS — Z9889 Other specified postprocedural states: Secondary | ICD-10-CM

## 2012-12-24 DIAGNOSIS — I059 Rheumatic mitral valve disease, unspecified: Secondary | ICD-10-CM

## 2012-12-24 DIAGNOSIS — Z7901 Long term (current) use of anticoagulants: Secondary | ICD-10-CM

## 2012-12-24 DIAGNOSIS — Z954 Presence of other heart-valve replacement: Secondary | ICD-10-CM | POA: Insufficient documentation

## 2012-12-24 DIAGNOSIS — I079 Rheumatic tricuspid valve disease, unspecified: Secondary | ICD-10-CM | POA: Insufficient documentation

## 2012-12-24 DIAGNOSIS — E785 Hyperlipidemia, unspecified: Secondary | ICD-10-CM | POA: Insufficient documentation

## 2012-12-24 NOTE — Progress Notes (Signed)
Echocardiogram performed.  

## 2013-01-03 ENCOUNTER — Telehealth: Payer: Self-pay | Admitting: Cardiology

## 2013-01-03 NOTE — Telephone Encounter (Signed)
New Problem  Pt needs clearance for dental proceedure// Pt ask's if the form was completed/// please advise.

## 2013-01-07 ENCOUNTER — Telehealth: Payer: Self-pay | Admitting: Cardiology

## 2013-01-07 NOTE — Telephone Encounter (Signed)
Walk In pt Form " Dr.Little" Clearance Dropped Off 01/07/13/KM

## 2013-01-07 NOTE — Telephone Encounter (Signed)
Pt aware form is complete and placed in medical records for faxing.

## 2013-01-10 ENCOUNTER — Other Ambulatory Visit: Payer: Self-pay | Admitting: Family Medicine

## 2013-01-10 ENCOUNTER — Telehealth: Payer: Self-pay | Admitting: *Deleted

## 2013-01-10 MED ORDER — WARFARIN SODIUM 5 MG PO TABS: ORAL_TABLET | ORAL | Status: DC

## 2013-01-10 NOTE — Telephone Encounter (Signed)
Are you handling this pt's coumadin & inr?  Looks like cards does?

## 2013-01-10 NOTE — Telephone Encounter (Signed)
Pt called stating has been instructed by Dr Clarene Duke to be off coumadin 5 days before dental procedure and he came by office and  had taken cardiac clearance to Dr Clarene Duke office This nurse called Dr Clarene Duke office and he states has received cardiac  clearance from Dr Jens Som and pt is to have Lovenox bridging. Procedure scheduled for 01/20/2013 . This nurse called pt and instructed him needs to come in for coumadin clinic appt on Monday 01/13/2013 to instruct in  Lovenox teaching and he states understanding and appt made

## 2013-01-10 NOTE — Telephone Encounter (Signed)
Will route to cards.

## 2013-01-13 ENCOUNTER — Ambulatory Visit (INDEPENDENT_AMBULATORY_CARE_PROVIDER_SITE_OTHER): Payer: BC Managed Care – PPO | Admitting: Pharmacist

## 2013-01-13 DIAGNOSIS — Z9889 Other specified postprocedural states: Secondary | ICD-10-CM

## 2013-01-13 DIAGNOSIS — I059 Rheumatic mitral valve disease, unspecified: Secondary | ICD-10-CM

## 2013-01-13 DIAGNOSIS — Z7901 Long term (current) use of anticoagulants: Secondary | ICD-10-CM

## 2013-01-13 DIAGNOSIS — Z23 Encounter for immunization: Secondary | ICD-10-CM

## 2013-01-13 MED ORDER — ENOXAPARIN SODIUM 80 MG/0.8ML ~~LOC~~ SOLN: 80.0000 mg | Freq: Two times a day (BID) | SUBCUTANEOUS | Status: DC

## 2013-01-13 NOTE — Patient Instructions (Addendum)
11/10- No Coumadin 11/11- No Coumadin 11/12- No Coumadin 11/13- Lovenox 80 mg 8 am and 8 pm  11/14- Lovenox 80 mg 8 am and 8 pm  11/15- Lovenox 80 mg 8 am and 8 pm  11/16- Lovenox 80 mg 8 am only (no evening dose)  11/17- No coumadin or Lovenox prior to procedure.  Restart Coumadin with 2 1/2 tablets 11/18- Lovenox 80mg  8 am and 8 pm and Coumadin 2 1/2 tablets  11/19- Lovenox 80mg  8 am and 8 pm and Coumadin 2 1/2 tablets 11/20- Lovenox 80mg  8 am and 8 pm and Coumadin 2 tablets 11/21- Recheck INR

## 2013-01-24 ENCOUNTER — Ambulatory Visit (INDEPENDENT_AMBULATORY_CARE_PROVIDER_SITE_OTHER): Payer: BC Managed Care – PPO | Admitting: General Practice

## 2013-01-24 DIAGNOSIS — I059 Rheumatic mitral valve disease, unspecified: Secondary | ICD-10-CM

## 2013-01-24 DIAGNOSIS — Z7901 Long term (current) use of anticoagulants: Secondary | ICD-10-CM

## 2013-01-24 DIAGNOSIS — Z9889 Other specified postprocedural states: Secondary | ICD-10-CM

## 2013-02-05 ENCOUNTER — Ambulatory Visit (INDEPENDENT_AMBULATORY_CARE_PROVIDER_SITE_OTHER): Payer: BC Managed Care – PPO | Admitting: *Deleted

## 2013-02-05 DIAGNOSIS — Z9889 Other specified postprocedural states: Secondary | ICD-10-CM

## 2013-02-05 DIAGNOSIS — Z7901 Long term (current) use of anticoagulants: Secondary | ICD-10-CM

## 2013-02-05 DIAGNOSIS — I059 Rheumatic mitral valve disease, unspecified: Secondary | ICD-10-CM

## 2013-02-28 ENCOUNTER — Ambulatory Visit (INDEPENDENT_AMBULATORY_CARE_PROVIDER_SITE_OTHER): Payer: BC Managed Care – PPO | Admitting: *Deleted

## 2013-02-28 DIAGNOSIS — I059 Rheumatic mitral valve disease, unspecified: Secondary | ICD-10-CM

## 2013-02-28 DIAGNOSIS — Z7901 Long term (current) use of anticoagulants: Secondary | ICD-10-CM

## 2013-02-28 DIAGNOSIS — Z9889 Other specified postprocedural states: Secondary | ICD-10-CM

## 2013-03-31 ENCOUNTER — Encounter: Payer: Self-pay | Admitting: Family Medicine

## 2013-03-31 ENCOUNTER — Ambulatory Visit (INDEPENDENT_AMBULATORY_CARE_PROVIDER_SITE_OTHER): Payer: BC Managed Care – PPO | Admitting: Family Medicine

## 2013-03-31 VITALS — BP 146/90 | HR 88 | Temp 97.5°F | Ht 76.0 in | Wt 195.0 lb

## 2013-03-31 DIAGNOSIS — I10 Essential (primary) hypertension: Secondary | ICD-10-CM

## 2013-03-31 DIAGNOSIS — R918 Other nonspecific abnormal finding of lung field: Secondary | ICD-10-CM | POA: Insufficient documentation

## 2013-03-31 DIAGNOSIS — R1012 Left upper quadrant pain: Secondary | ICD-10-CM

## 2013-03-31 DIAGNOSIS — R911 Solitary pulmonary nodule: Secondary | ICD-10-CM

## 2013-03-31 MED ORDER — ATORVASTATIN CALCIUM 20 MG PO TABS: 20.0000 mg | ORAL_TABLET | Freq: Every day | ORAL | Status: DC

## 2013-03-31 NOTE — Patient Instructions (Addendum)
Zantac can be helpful for heartburn as well. 75mg  up to twice a days.   We will repeat your Chest CT in May.

## 2013-03-31 NOTE — Progress Notes (Signed)
Subjective:    Patient ID: Corey Mathis, male    DOB: 05/24/1950, 63 y.o.   MRN: [id number]  HPI Hypertension- Pt denies chest pain, SOB, dizziness, or heart palpitations.  Taking meds as directed w/o problems.  Denies medication side effects.    pt c/o L rib pain especially when he slouches to the left. he reports that the pain has been bothering him for the past 3-4 mos. Feels bette when walking.  No SOB or abdominal swelling.  Feels more like indigestion.  Hx of smoker.  Some nausea occ.  Will occ feel like a heartburn sensation.  He notices when his bowels are a little bit more slow recent mildly constipated that the discomfort is more.. Slowing down his food intake helps.   Note, he did have his sinus surgery as well as his teeth repaired. He says he still trying to get used to doing a little differently. Overall has gained about 15 pounds. He was really under weight previously.  Review of Systems  BP 146/90  Pulse 88  Temp(Src) 97.5 F (36.4 C)  Ht 6\' 4"  (1.93 m)  Wt 195 lb (88.451 kg)  BMI 23.75 kg/m2  SpO2 99%    Allergies  Allergen Reactions  . Lisinopril Anaphylaxis    angioedema  . Carvedilol   . Codeine Sulfate   . Ezetimibe-Simvastatin   . Propranolol Hcl   . Ramipril     Past Medical History  Diagnosis Date  . Labyrinthitis     hx  . Asthma   . Anxiety   . Allergy history unknown   . Mitral valve replaced     hx  . Dyslipidemia   . HTN (hypertension)   . Allergy   . CHF (congestive heart failure)     Past Surgical History  Procedure Laterality Date  . Jaw surgery (other)    . Acne cyst removal      from hand and back  . Valve replacement  1998    St. Jude, mitral  . Nasal sinus surgery N/A 10/16/2012    Procedure: NASAL ENDOSCOPIC POLYPECTOMY/MAXILLARY ANTROSTOMY/ETHMOIDECTOMY;  Surgeon: Izora Gala, MD;  Location: [facility];  Service: ENT;  Laterality: N/A;    History   Social History  . Marital Status: Married    Spouse Name: Frenchie     Number of Children: 2  . 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 Smoking status: Former Smoker    Quit date: 04/26/1991  . Smokeless tobacco: Never Used  . Alcohol Use: No  . Drug Use: No  . Sexual Activity: Not on file   Other Topics Concern  . Not on file   Social History Narrative   Some exercise, bike and walking.  2 cups per day.     Family History  Problem Relation Age of Onset  . Heart attack Father 79  . Colon cancer Mother 39  . Hypertension Mother     Outpatient Encounter Prescriptions as of 03/31/2013  Medication Sig  . albuterol (PROAIR HFA) 108 (90 BASE) MCG/ACT inhaler Inhale 2 puffs into the lungs every 6 (six) hours as needed for wheezing.  Marland Kitchen aspirin 81 MG EC tablet Take 81 mg by mouth daily.   Marland Kitchen atorvastatin (LIPITOR) 20 MG tablet Take 1 tablet (20 mg total) by mouth daily.  . budesonide-formoterol (SYMBICORT) 160-4.5 MCG/ACT inhaler Inhale 2 puffs into the lungs 2 (two) times daily.  INHALE TWO PUFFS BY MOUTH TWICE DAILY  . Calcium-Vitamin D-Vitamin K (CALCIUM + D) [phone] MG-UNT-MCG CHEW Chew 1 tablet by mouth daily.  Marland Kitchen diltiazem (CARDIZEM CD) 240 MG 24 hr capsule Take 240 mg by mouth daily. TAKE ONE CAPSULE BY MOUTH EVERY DAY  . ferrous sulfate 325 (65 FE) MG tablet Take 325 mg by mouth daily with breakfast.  . hydrochlorothiazide (MICROZIDE) 12.5 MG capsule Take 1 capsule (12.5 mg total) by mouth daily.  . metoprolol (LOPRESSOR) 50 MG tablet Take 50 mg by mouth 2 (two) times daily. TAKE ONE TABLET BY MOUTH TWICE DAILY  . Multiple Vitamin (MULTIVITAMIN) tablet Take 1 tablet by mouth daily.  . Omega-3 Fatty Acids (FISH OIL) 1000 MG CAPS Take 1 capsule by mouth daily.  Marland Kitchen OVER THE COUNTER MEDICATION Co q 10 gummies.  Marland Kitchen terazosin (HYTRIN) 2 MG capsule Take 2 mg by mouth at bedtime.  Marland Kitchen warfarin (COUMADIN) 5 MG tablet TAKE AS DIRECTED  . warfarin (COUMADIN) 5 MG tablet Take as directed by coumadin clinic   . [DISCONTINUED] atorvastatin (LIPITOR) 20 MG tablet Take 20 mg by mouth daily.  . [DISCONTINUED] enoxaparin (LOVENOX) 80 MG/0.8ML injection Inject 0.8 mLs (80 mg total) into the skin every 12 (twelve) hours.          Objective:   Physical Exam  Constitutional: He is oriented to person, place, and time. He appears well-developed and well-nourished.  HENT:  Head: Normocephalic and atraumatic.  Cardiovascular: Normal rate, regular rhythm and normal heart sounds.   Pulmonary/Chest: Effort normal and breath sounds normal.  Non tender over the chest wall. nontender ribs  Abdominal: Soft. Bowel sounds are normal. He exhibits no distension and no mass. There is no tenderness. There is no rebound and no guarding.  Neurological: He is alert and oriented to person, place, and time.  Skin: Skin is warm and dry.  Psychiatric: He has a normal mood and affect. His behavior is normal.          Assessment & Plan:  HTN- metoprolol and HCTZ, diltiazem.  Tolerating them well. Followup in 6 months. Last lipid panel was in July.  F/U lung nodule. Will need repeat CT. Overall I think he is low risk. He is no longer smoker does not have a family history of lung cancer. We opted to consider repeating in may, which is approximately 9 months.  Left upper quadrant pain  -suspect it's likely related to GERD. Recommend a trial of Zantac. He also seems somewhat positional. When he leans to the left especially after eating or overeating it seems worse. It feels better with stretching out. He's not really express any shortness of breath. I would like to check amylase and lipase to make sure pancreatic enzymes are okay. He has gained a little bit of weight but has not noticed any abdominal swelling. He is nontender over the left lower ribs themselves I do not suspect fracture.

## 2013-04-03 ENCOUNTER — Other Ambulatory Visit: Payer: PRIVATE HEALTH INSURANCE

## 2013-04-03 ENCOUNTER — Ambulatory Visit (INDEPENDENT_AMBULATORY_CARE_PROVIDER_SITE_OTHER): Payer: BC Managed Care – PPO

## 2013-04-03 DIAGNOSIS — Z5181 Encounter for therapeutic drug level monitoring: Secondary | ICD-10-CM

## 2013-04-03 DIAGNOSIS — Z9889 Other specified postprocedural states: Secondary | ICD-10-CM

## 2013-04-03 DIAGNOSIS — I059 Rheumatic mitral valve disease, unspecified: Secondary | ICD-10-CM

## 2013-04-03 LAB — POCT INR: INR: 3.9

## 2013-04-04 LAB — COMPLETE METABOLIC PANEL WITH GFR
ALT: 24 U/L (ref 0–53)
AST: 37 U/L (ref 0–37)
Albumin: 4 g/dL (ref 3.5–5.2)
Alkaline Phosphatase: 65 U/L (ref 39–117)
BILIRUBIN TOTAL: 0.6 mg/dL (ref 0.2–1.2)
BUN: 11 mg/dL (ref 6–23)
CALCIUM: 9 mg/dL (ref 8.4–10.5)
CHLORIDE: 98 meq/L (ref 96–112)
CO2: 33 mEq/L — ABNORMAL HIGH (ref 19–32)
CREATININE: 0.9 mg/dL (ref 0.50–1.35)
GFR, Est African American: >89 mL/min
GFR, Est Non African American: >89 mL/min
Glucose, Bld: 81 mg/dL (ref 70–99)
Potassium: 4 mEq/L (ref 3.5–5.3)
Sodium: 136 mEq/L (ref 135–145)
Total Protein: 7.2 g/dL (ref 6.0–8.3)

## 2013-04-04 LAB — LIPASE: LIPASE: 36 U/L (ref 0–75)

## 2013-04-04 LAB — AMYLASE: AMYLASE: 32 U/L (ref 0–105)

## 2013-04-08 ENCOUNTER — Other Ambulatory Visit: Payer: Self-pay | Admitting: Cardiology

## 2013-04-08 ENCOUNTER — Telehealth: Payer: Self-pay | Admitting: *Deleted

## 2013-04-08 MED ORDER — WARFARIN SODIUM 5 MG PO TABS: ORAL_TABLET | ORAL | Status: DC

## 2013-04-08 NOTE — Telephone Encounter (Signed)
Patient requests coumadin refill to be sent to [facility] on [address]. Thanks, MI 

## 2013-04-24 ENCOUNTER — Ambulatory Visit (INDEPENDENT_AMBULATORY_CARE_PROVIDER_SITE_OTHER): Payer: BC Managed Care – PPO

## 2013-04-24 DIAGNOSIS — Z9889 Other specified postprocedural states: Secondary | ICD-10-CM

## 2013-04-24 DIAGNOSIS — I059 Rheumatic mitral valve disease, unspecified: Secondary | ICD-10-CM

## 2013-04-24 DIAGNOSIS — Z5181 Encounter for therapeutic drug level monitoring: Secondary | ICD-10-CM

## 2013-04-24 DIAGNOSIS — Z7901 Long term (current) use of anticoagulants: Secondary | ICD-10-CM

## 2013-04-24 LAB — POCT INR: INR: 3.2

## 2013-04-25 ENCOUNTER — Other Ambulatory Visit: Payer: Self-pay | Admitting: Otolaryngology

## 2013-04-25 DIAGNOSIS — D491 Neoplasm of unspecified behavior of respiratory system: Secondary | ICD-10-CM

## 2013-04-30 ENCOUNTER — Other Ambulatory Visit: Payer: BC Managed Care – PPO

## 2013-05-07 ENCOUNTER — Ambulatory Visit
Admission: RE | Admit: 2013-05-07 | Discharge: 2013-05-07 | Disposition: A | Discharge status: Home / Self Care | Payer: Medicare Other | Source: Ambulatory Visit | Attending: Otolaryngology | Admitting: Otolaryngology

## 2013-05-07 DIAGNOSIS — R911 Solitary pulmonary nodule: Secondary | ICD-10-CM | POA: Diagnosis not present

## 2013-05-07 DIAGNOSIS — D491 Neoplasm of unspecified behavior of respiratory system: Secondary | ICD-10-CM

## 2013-05-20 ENCOUNTER — Other Ambulatory Visit: Payer: Self-pay | Admitting: Cardiology

## 2013-05-21 ENCOUNTER — Ambulatory Visit (INDEPENDENT_AMBULATORY_CARE_PROVIDER_SITE_OTHER): Payer: Medicare Other | Admitting: *Deleted

## 2013-05-21 DIAGNOSIS — Z5181 Encounter for therapeutic drug level monitoring: Secondary | ICD-10-CM

## 2013-05-21 DIAGNOSIS — Z7901 Long term (current) use of anticoagulants: Secondary | ICD-10-CM | POA: Diagnosis not present

## 2013-05-21 DIAGNOSIS — Z9889 Other specified postprocedural states: Secondary | ICD-10-CM

## 2013-05-21 DIAGNOSIS — I059 Rheumatic mitral valve disease, unspecified: Secondary | ICD-10-CM

## 2013-05-21 LAB — POCT INR: INR: 5.7

## 2013-06-20 ENCOUNTER — Ambulatory Visit (INDEPENDENT_AMBULATORY_CARE_PROVIDER_SITE_OTHER): Payer: Medicare Other | Admitting: *Deleted

## 2013-06-20 DIAGNOSIS — Z7901 Long term (current) use of anticoagulants: Secondary | ICD-10-CM

## 2013-06-20 DIAGNOSIS — I059 Rheumatic mitral valve disease, unspecified: Secondary | ICD-10-CM

## 2013-06-20 DIAGNOSIS — Z9889 Other specified postprocedural states: Secondary | ICD-10-CM

## 2013-06-20 DIAGNOSIS — Z5181 Encounter for therapeutic drug level monitoring: Secondary | ICD-10-CM | POA: Diagnosis not present

## 2013-06-20 LAB — PROTIME-INR
INR: 7.2 ratio (ref 0.8–1.0)
Prothrombin Time: 75.9 s (ref 10.2–12.4)

## 2013-06-20 LAB — POCT INR: INR: 6.6

## 2013-06-23 ENCOUNTER — Other Ambulatory Visit: Payer: Self-pay | Admitting: Cardiology

## 2013-06-26 ENCOUNTER — Ambulatory Visit (INDEPENDENT_AMBULATORY_CARE_PROVIDER_SITE_OTHER): Payer: Medicare Other | Admitting: *Deleted

## 2013-06-26 DIAGNOSIS — Z7901 Long term (current) use of anticoagulants: Secondary | ICD-10-CM

## 2013-06-26 DIAGNOSIS — Z5181 Encounter for therapeutic drug level monitoring: Secondary | ICD-10-CM

## 2013-06-26 DIAGNOSIS — I059 Rheumatic mitral valve disease, unspecified: Secondary | ICD-10-CM

## 2013-06-26 DIAGNOSIS — Z9889 Other specified postprocedural states: Secondary | ICD-10-CM | POA: Diagnosis not present

## 2013-06-26 LAB — POCT INR: INR: 1.5

## 2013-07-03 ENCOUNTER — Ambulatory Visit (INDEPENDENT_AMBULATORY_CARE_PROVIDER_SITE_OTHER): Payer: Medicare Other

## 2013-07-03 DIAGNOSIS — I059 Rheumatic mitral valve disease, unspecified: Secondary | ICD-10-CM

## 2013-07-03 DIAGNOSIS — Z7901 Long term (current) use of anticoagulants: Secondary | ICD-10-CM

## 2013-07-03 DIAGNOSIS — Z5181 Encounter for therapeutic drug level monitoring: Secondary | ICD-10-CM | POA: Diagnosis not present

## 2013-07-03 DIAGNOSIS — Z9889 Other specified postprocedural states: Secondary | ICD-10-CM

## 2013-07-03 LAB — POCT INR: INR: 3.4

## 2013-07-08 ENCOUNTER — Other Ambulatory Visit: Payer: Self-pay | Admitting: Family Medicine

## 2013-07-21 ENCOUNTER — Ambulatory Visit (INDEPENDENT_AMBULATORY_CARE_PROVIDER_SITE_OTHER): Payer: Medicare Other | Admitting: *Deleted

## 2013-07-21 DIAGNOSIS — Z7901 Long term (current) use of anticoagulants: Secondary | ICD-10-CM | POA: Diagnosis not present

## 2013-07-21 DIAGNOSIS — I059 Rheumatic mitral valve disease, unspecified: Secondary | ICD-10-CM

## 2013-07-21 DIAGNOSIS — Z9889 Other specified postprocedural states: Secondary | ICD-10-CM | POA: Diagnosis not present

## 2013-07-21 DIAGNOSIS — Z5181 Encounter for therapeutic drug level monitoring: Secondary | ICD-10-CM | POA: Diagnosis not present

## 2013-07-21 LAB — POCT INR: INR: 3.2

## 2013-08-18 ENCOUNTER — Ambulatory Visit (INDEPENDENT_AMBULATORY_CARE_PROVIDER_SITE_OTHER): Payer: Medicare Other | Admitting: *Deleted

## 2013-08-18 DIAGNOSIS — Z7901 Long term (current) use of anticoagulants: Secondary | ICD-10-CM | POA: Diagnosis not present

## 2013-08-18 DIAGNOSIS — I059 Rheumatic mitral valve disease, unspecified: Secondary | ICD-10-CM | POA: Diagnosis not present

## 2013-08-18 DIAGNOSIS — Z5181 Encounter for therapeutic drug level monitoring: Secondary | ICD-10-CM

## 2013-08-18 DIAGNOSIS — Z9889 Other specified postprocedural states: Secondary | ICD-10-CM | POA: Diagnosis not present

## 2013-08-18 LAB — POCT INR: INR: 2.6

## 2013-09-09 ENCOUNTER — Encounter (HOSPITAL_COMMUNITY): Payer: Self-pay | Admitting: Emergency Medicine

## 2013-09-09 ENCOUNTER — Emergency Department (HOSPITAL_COMMUNITY)
Admission: EM | Admit: 2013-09-09 | Discharge: 2013-09-10 | Disposition: A | Discharge status: Home / Self Care | Payer: Medicare Other | Attending: Emergency Medicine | Admitting: Emergency Medicine

## 2013-09-09 ENCOUNTER — Emergency Department (HOSPITAL_COMMUNITY): Payer: Medicare Other

## 2013-09-09 ENCOUNTER — Emergency Department (INDEPENDENT_AMBULATORY_CARE_PROVIDER_SITE_OTHER)
Admission: EM | Admit: 2013-09-09 | Discharge: 2013-09-09 | Disposition: A | Discharge status: Nursing Home (Includes ICF) | Payer: Medicare Other | Source: Home / Self Care | Attending: Emergency Medicine | Admitting: Emergency Medicine

## 2013-09-09 DIAGNOSIS — IMO0002 Reserved for concepts with insufficient information to code with codable children: Secondary | ICD-10-CM | POA: Insufficient documentation

## 2013-09-09 DIAGNOSIS — I10 Essential (primary) hypertension: Secondary | ICD-10-CM | POA: Diagnosis not present

## 2013-09-09 DIAGNOSIS — Z8669 Personal history of other diseases of the nervous system and sense organs: Secondary | ICD-10-CM | POA: Insufficient documentation

## 2013-09-09 DIAGNOSIS — J45909 Unspecified asthma, uncomplicated: Secondary | ICD-10-CM | POA: Insufficient documentation

## 2013-09-09 DIAGNOSIS — I509 Heart failure, unspecified: Secondary | ICD-10-CM | POA: Diagnosis not present

## 2013-09-09 DIAGNOSIS — R0789 Other chest pain: Secondary | ICD-10-CM

## 2013-09-09 DIAGNOSIS — Z791 Long term (current) use of non-steroidal anti-inflammatories (NSAID): Secondary | ICD-10-CM | POA: Insufficient documentation

## 2013-09-09 DIAGNOSIS — Z954 Presence of other heart-valve replacement: Secondary | ICD-10-CM | POA: Diagnosis not present

## 2013-09-09 DIAGNOSIS — R079 Chest pain, unspecified: Secondary | ICD-10-CM

## 2013-09-09 DIAGNOSIS — Z79899 Other long term (current) drug therapy: Secondary | ICD-10-CM | POA: Diagnosis not present

## 2013-09-09 DIAGNOSIS — Z87891 Personal history of nicotine dependence: Secondary | ICD-10-CM | POA: Diagnosis not present

## 2013-09-09 DIAGNOSIS — E785 Hyperlipidemia, unspecified: Secondary | ICD-10-CM | POA: Insufficient documentation

## 2013-09-09 DIAGNOSIS — Z8659 Personal history of other mental and behavioral disorders: Secondary | ICD-10-CM | POA: Diagnosis not present

## 2013-09-09 DIAGNOSIS — Z7982 Long term (current) use of aspirin: Secondary | ICD-10-CM | POA: Diagnosis not present

## 2013-09-09 DIAGNOSIS — Z7901 Long term (current) use of anticoagulants: Secondary | ICD-10-CM | POA: Insufficient documentation

## 2013-09-09 LAB — CBC
HCT: 42.2 % (ref 39.0–52.0)
HEMOGLOBIN: 14.5 g/dL (ref 13.0–17.0)
MCH: 29.9 pg (ref 26.0–34.0)
MCHC: 34.4 g/dL (ref 30.0–36.0)
MCV: 87 fL (ref 78.0–100.0)
PLATELETS: 158 10*3/uL (ref 150–400)
RBC: 4.85 MIL/uL (ref 4.22–5.81)
RDW: 13.5 % (ref 11.5–15.5)
WBC: 4 10*3/uL (ref 4.0–10.5)

## 2013-09-09 LAB — PROTIME-INR
INR: 2.82 — ABNORMAL HIGH (ref 0.00–1.49)
Prothrombin Time: 29.7 seconds — ABNORMAL HIGH (ref 11.6–15.2)

## 2013-09-09 LAB — BASIC METABOLIC PANEL
Anion gap: 12 (ref 5–15)
BUN: 14 mg/dL (ref 6–23)
CALCIUM: 9 mg/dL (ref 8.4–10.5)
CO2: 28 mEq/L (ref 19–32)
CREATININE: 1 mg/dL (ref 0.50–1.35)
Chloride: 99 mEq/L (ref 96–112)
GFR calc Af Amer: 89 mL/min — ABNORMAL LOW (ref >90)
GFR calc non Af Amer: 77 mL/min — ABNORMAL LOW (ref >90)
GLUCOSE: 101 mg/dL — AB (ref 70–99)
Potassium: 3.9 mEq/L (ref 3.7–5.3)
Sodium: 139 mEq/L (ref 137–147)

## 2013-09-09 LAB — TROPONIN I: Troponin I: <0.3 ng/mL (ref <0.30)

## 2013-09-09 MED ORDER — NITROGLYCERIN 0.4 MG SL SUBL
SUBLINGUAL_TABLET | SUBLINGUAL | Status: AC
  Filled 2013-09-09: qty 1

## 2013-09-09 MED ORDER — SODIUM CHLORIDE 0.9 % IV SOLN
INTRAVENOUS | Status: DC
  Administered 2013-09-09: 20:00:00 via INTRAVENOUS

## 2013-09-09 MED ORDER — NITROGLYCERIN 0.4 MG SL SUBL
0.4000 mg | SUBLINGUAL_TABLET | SUBLINGUAL | Status: AC | PRN
  Administered 2013-09-09: 0.4 mg via SUBLINGUAL

## 2013-09-09 NOTE — ED Notes (Signed)
Pt from home. Has been having left sided chest pain on and off since last night. Denies radiating pain. Went to [facility] this evening. Received 1 nitro SL tablet. Denies CP at this time. Hx mitral valve replacement and takes coumadin and ASA daily. EKG performed at [facility]. NSR on EKG.

## 2013-09-09 NOTE — ED Provider Notes (Addendum)
Chief Complaint   Chief Complaint  Patient presents with  . Chest Pain    History of Present Illness    Corey Mathis is a 63 year old male with a history of a mitral valve replacement in 1998 who is on Coumadin aspirin. Yesterday he mowed his lawn and a hearty dinner including beans, chicken, and watermelon. He went to bed right away. He was awakened around 1 to 2 AM with a sharp left pectoral chest pain which did not radiate. The pain has recurred off and on ever since then lasting for seconds to minutes at a time. There no obvious precipitating factors except possibly lying on his left side. The pain is nonexertional. It's not worse with meals. He's had some nausea but denies any diaphoresis or shortness of breath. He's also had some palpitations and dizziness. He denies fever, chills, coughing, wheezing, syncope, abdominal pain, vomiting, or leg pain or swelling. He's had no history of ischemic heart disease and no history of pulmonary embolism, DVT, or blood clots. Dr. [name] is a his cardiologist.  Review of Systems    Other than noted above, the patient denies any of the following symptoms. Systemic:  No fever or chills. Pulmonary:  No cough, wheezing, shortness of breath, sputum production, hemoptysis. Cardiac:  No palpitations, rapid heartbeat, dizziness, presyncope or syncope. GI:  No abdominal pain, heartburn, nausea, or vomiting. Ext:  No leg pain or swelling.  [name]    Past medical history, family history, social history, meds, and allergies were reviewed.   Physical Exam     Vital signs:  BP 157/94  Pulse 77  Temp(Src) 98.3 F (36.8 C)  Resp 20  SpO2 100% Gen:  Alert, oriented, in no distress, skin warm and dry. Eye:  PERRL, lids and conjunctivas normal.  Sclera non-icteric. ENT:  Mucous membranes moist, pharynx clear. Neck:  Supple, no adenopathy or tenderness.  No JVD. Lungs:  Clear to auscultation, no wheezes, rales or rhonchi.  No respiratory  distress. Heart:  Regular rhythm.  No gallops, murmers, clicks or rubs. Chest:  No chest wall tenderness. Abdomen:  Soft, nontender, no organomegaly or mass.  Bowel sounds normal.  No pulsatile abdominal mass or bruit. Ext:  No edema.  No calf tenderness and Homann's sign negative.  Pulses full and equal. Skin:  Warm and dry.  No rash.  EKG Results:  Date: 09/09/2013  Rate: 72  Rhythm: normal sinus rhythm  QRS Axis: 54  Intervals: QT prolonged  ST/T Wave abnormalities: normal  Conduction Disutrbances:none  Narrative Interpretation: Normal sinus rhythm, prolonged QT interval, otherwise normal.  Old EKG Reviewed: none available    Course in [facility] [place] on IV normal saline, oxygen and by nasal prongs, monitored, and given nitroglycerin 0.4 mg sublingually. He was not given aspirin since he is on Coumadin and aspirin at home.  Assessment     The encounter diagnosis was Chest pain, unspecified chest pain type.  Differential diagnosis is acute coronary syndrome, pulmonary embolism, ruptured aneurysm, pneumothorax, Boerhaave syndrome, pericarditis, musculoskeletal pain, or reflux esophagitis.   Plan     The patient was transferred to the ED via [place] in stable condition.  Medical Decision Making:  63 year old male with history of mitral valve replacement on Coumadin and ASA was awakened last night with sharp left pectoral chest pain without radiation.  Pain would come and go all day, unrelated to exertion.  Pain is not pleuritic.  Denies shortness of breath or diaphoresis, but has had some nausea.  Pain lasts seconds to minutes at a time.  No history of ischemic heart disease.  He does have HT and hypercholesterolemia.  Has pos family history (father at 54).  His EKG shows prolonged PR interval, otherwise normal.  We  will give TNG and start IV NS.  No ASA since he's on Coumadin and ASA at home.       Harden Mo, MD 09/09/13 2006  Harden Mo, MD 09/09/13 2006

## 2013-09-09 NOTE — ED Notes (Signed)
Report given to Angie for [facility] by phone.  Report given to charge RN Melissa.

## 2013-09-09 NOTE — Discharge Instructions (Signed)
We have determined that your problem requires further evaluation in the emergency department.  We will take care of your transport there.  Once at the emergency department, you will be evaluated by a provider and they will order whatever treatment or tests they deem necessary.  We cannot guarantee that they will do any specific test or do any specific treatment.  ° °

## 2013-09-09 NOTE — ED Notes (Signed)
Patient states he was rushing to get his yard work done yesterday.  Last night for dinner he ate pork and beans, watermelon.  Woke up about 1 AM with pain to the left outer chest.  Denies SOB, sweating, N/V

## 2013-09-09 NOTE — ED Notes (Signed)
[facility] called. 

## 2013-09-09 NOTE — ED Notes (Signed)
C/o L ant. chest pain that woke him up last night @ 0100.  No SOB, nausea, sweating or radiation of pain.  States he ate watermelon, hamburger, and beans last night before he went to bed @ 1100.  Pain comes and goes.

## 2013-09-10 LAB — TROPONIN I: Troponin I: <0.3 ng/mL (ref <0.30)

## 2013-09-10 NOTE — Discharge Instructions (Signed)
Your workup today did not show any acute heart injury.  It is not completely clear at this time what caused your pain.  Please return to the ER for worsening condition or new concerning symptoms.   Chest Pain Observation It is often hard to give a specific diagnosis for the cause of chest pain. Among other possibilities your symptoms might be caused by inadequate oxygen delivery to your heart (angina). Angina that is not treated or evaluated can lead to a heart attack (myocardial infarction) or death. Blood tests, electrocardiograms, and X-rays may have been done to help determine a possible cause of your chest pain. After evaluation and observation, your health care provider has determined that it is unlikely your pain was caused by an unstable condition that requires hospitalization. However, a full evaluation of your pain may need to be completed, with additional diagnostic testing as directed. It is very important to keep your follow-up appointments. Not keeping your follow-up appointments could result in permanent heart damage, disability, or death. If there is any problem keeping your follow-up appointments, you must call your health care provider. HOME CARE INSTRUCTIONS  Due to the slight chance that your pain could be angina, it is important to follow your health care provider's treatment plan and also maintain a healthy lifestyle:  Maintain or work toward achieving a healthy weight.  Stay physically active and exercise regularly.  Decrease your salt intake.  Eat a balanced, healthy diet. Talk to a dietitian to learn about heart-healthy foods.  Increase your fiber intake by including whole grains, vegetables, fruits, and nuts in your diet.  Avoid situations that cause stress, anger, or depression.  Take medicines as advised by your health care provider. Report any side effects to your health care provider. Do not stop medicines or adjust the dosages on your own.  Quit smoking. Do not  use nicotine patches or gum until you check with your health care provider.  Keep your blood pressure, blood sugar, and cholesterol levels within normal limits.  Limit alcohol intake to no more than 1 drink per day for women who are not pregnant and 2 drinks per day for men.  Do not abuse drugs. SEEK IMMEDIATE MEDICAL CARE IF: You have severe chest pain or pressure which may include symptoms such as:  You feel pain or pressure in your arms, neck, jaw, or back.  You have severe back or abdominal pain, feel sick to your stomach (nauseous), or throw up (vomit).  You are sweating profusely.  You are having a fast or irregular heartbeat.  You feel short of breath while at rest.  You notice increasing shortness of breath during rest, sleep, or with activity.  You have chest pain that does not get better after rest or after taking your usual medicine.  You wake from sleep with chest pain.  You are unable to sleep because you cannot breathe.  You develop a frequent cough or you are coughing up blood.  You feel dizzy, faint, or experience extreme fatigue.  You develop severe weakness, dizziness, fainting, or chills. Any of these symptoms may represent a serious problem that is an emergency. Do not wait to see if the symptoms will go away. Call your local emergency services (911 in the [place] Do not drive yourself to the hospital. MAKE SURE YOU:  Understand these instructions.  Will watch your condition.  Will get help right away if you are not doing well or get worse. Document Released: 03/25/2010 Document Revised: 02/25/2013 Document  Reviewed: 08/22/2012 [employer] Patient Information 2015 [place], [place]. This information is not intended to replace advice given to you by your health care provider. Make sure you discuss any questions you have with your health care provider.  Pain of Unknown Etiology (Pain Without a Known Cause) You have come to your caregiver because of pain. Pain  can occur in any part of the body. Often there is not a definite cause. If your laboratory (blood or urine) work was normal and X-rays or other studies were normal, your caregiver may treat you without knowing the cause of the pain. An example of this is the headache. Most headaches are diagnosed by taking a history. This means your caregiver asks you questions about your headaches. Your caregiver determines a treatment based on your answers. Usually testing done for headaches is normal. Often testing is not done unless there is no response to medications. Regardless of where your pain is located today, you can be given medications to make you comfortable. If no physical cause of pain can be found, most cases of pain will gradually leave as suddenly as they came.  If you have a painful condition and no reason can be found for the pain, it is important that you follow up with your caregiver. If the pain becomes worse or does not go away, it may be necessary to repeat tests and look further for a possible cause.  Only take over-the-counter or prescription medicines for pain, discomfort, or fever as directed by your caregiver.  For the protection of your privacy, test results cannot be given over the phone. Make sure you receive the results of your test. Ask how these results are to be obtained if you have not been informed. It is your responsibility to obtain your test results.  You may continue all activities unless the activities cause more pain. When the pain lessens, it is important to gradually resume normal activities. Resume activities by beginning slowly and gradually increasing the intensity and duration of the activities or exercise. During periods of severe pain, bed rest may be helpful. Lie or sit in any position that is comfortable.  Ice used for acute (sudden) conditions may be effective. Use a large plastic bag filled with ice and wrapped in a towel. This may provide pain relief.  See your  caregiver for continued problems. Your caregiver can help or refer you for exercises or physical therapy if necessary. If you were given medications for your condition, do not drive, operate machinery or power tools, or sign legal documents for 24 hours. Do not drink alcohol, take sleeping pills, or take other medications that may interfere with treatment. See your caregiver immediately if you have pain that is becoming worse and not relieved by medications. Document Released: 11/15/2000 Document Revised: 12/11/2012 Document Reviewed: 02/20/2005 [employer] Patient Information 2015 [place], [place]. This information is not intended to replace advice given to you by your health care provider. Make sure you discuss any questions you have with your health care provider.

## 2013-09-10 NOTE — ED Provider Notes (Signed)
CSN: [id number]     Arrival date & time 09/09/13  2046 History   First MD Initiated Contact with Patient 09/09/13 2301     Chief Complaint  Patient presents with  . Chest Pain     (Consider location/radiation/quality/duration/timing/severity/associated sxs/prior Treatment) HPI 63 yo male presents to the ER from urgent care with complaint of chest pain.  Pain started yesterday around 1 am, waking from sleep.  Pain is in upper left chest.  Pain has been intermittent, sharp, lastting a few seconds at a time.  No pain since being in the ER.  Pt has h/o mitral valve replacement, on coumadin, HTN.  No h/o CAD.  No sob, wheezing, cough, nausea, diaphoresis, radiation of pain.  Pt reports he mowed his yard yesterday, had big dinner.  Pt seen in UC, had ntg, fluids, 02.  No change in pain.   Past Medical History  Diagnosis Date  . Labyrinthitis     hx  . Asthma   . Anxiety   . Allergy history unknown   . Mitral valve replaced     hx  . Dyslipidemia   . HTN (hypertension)   . Allergy   . CHF (congestive heart failure)    Past Surgical History  Procedure Laterality Date  . Jaw surgery (other)    . Acne cyst removal      from hand and back  . Valve replacement  1998    [employer], mitral  . Nasal sinus surgery N/A 10/16/2012    Procedure: NASAL ENDOSCOPIC POLYPECTOMY/MAXILLARY ANTROSTOMY/ETHMOIDECTOMY;  Surgeon: Izora Gala, MD;  Location: [facility];  Service: ENT;  Laterality: N/A;  . Tooth extraction     Family History  Problem Relation Age of Onset  . Heart attack Father 56  . Colon cancer Mother 42  . Hypertension Mother    History  Substance Use Topics  . Smoking status: Former Smoker    Quit date: 04/26/1991  . Smokeless tobacco: Never Used  . Alcohol Use: No    Review of Systems  All other systems reviewed and are negative.     Allergies  Lisinopril; Carvedilol; Codeine sulfate; Ezetimibe-simvastatin; Propranolol hcl; and Ramipril  Home Medications   Prior to  Admission medications   Medication Sig Start Date End Date Taking? Authorizing Provider  albuterol (PROAIR HFA) 108 (90 BASE) MCG/ACT inhaler Inhale 2 puffs into the lungs every 6 (six) hours as needed for wheezing. 10/18/12 01/13/14 Yes [name], MD  aspirin 81 MG EC tablet Take 81 mg by mouth daily.    Yes Historical Provider, MD  atorvastatin (LIPITOR) 20 MG tablet Take 1 tablet (20 mg total) by mouth daily. 03/31/13 05/21/15 Yes [name], MD  budesonide-formoterol [facility]) 160-4.5 MCG/ACT inhaler Inhale 1 puff into the lungs 2 (two) times daily.   Yes Historical Provider, MD  CALCIUM-VITAMIN D PO Take 1 tablet by mouth daily.   Yes Historical Provider, MD  diltiazem (CARDIZEM CD) 240 MG 24 hr capsule Take 240 mg by mouth daily.   Yes Historical Provider, MD  hydrochlorothiazide (MICROZIDE) 12.5 MG capsule Take 1 capsule (12.5 mg total) by mouth daily. 10/17/12  Yes Lelon Perla, MD  ibuprofen (ADVIL,MOTRIN) 200 MG tablet Take 400 mg by mouth every 6 (six) hours as needed for headache or mild pain.    Yes Historical Provider, MD  Iron-Vitamins (GERITOL PO) Take 15 mLs by mouth daily.    Yes Historical Provider, MD  metoprolol (LOPRESSOR) 50 MG tablet Take 50  mg by mouth 2 (two) times daily.   Yes Historical Provider, MD  Multiple Vitamin (MULTIVITAMIN) tablet Take 1 tablet by mouth daily.   Yes Historical Provider, MD  warfarin (COUMADIN) 5 MG tablet Take 10 mg by mouth at bedtime.   Yes Historical Provider, MD  ferrous sulfate 325 (65 FE) MG tablet Take 325 mg by mouth daily with breakfast.    Historical Provider, MD   BP 164/97  Pulse 63  Temp(Src) 98.3 F (36.8 C) (Oral)  Resp 17  Ht 6\' 4"  (1.93 m)  Wt 195 lb (88.451 kg)  BMI 23.75 kg/m2  SpO2 99% Physical Exam  Nursing note and vitals reviewed. Constitutional: He is oriented to person, place, and time. He appears well-developed and well-nourished. No distress.  HENT:  Head: Normocephalic and atraumatic.   Right Ear: External ear normal.  Left Ear: External ear normal.  Nose: Nose normal.  Mouth/Throat: Oropharynx is clear and moist.  Eyes: Conjunctivae and EOM are normal. Pupils are equal, round, and reactive to light.  Neck: Normal range of motion. Neck supple. No JVD present. No tracheal deviation present. No thyromegaly present.  Cardiovascular: Normal rate, regular rhythm, normal heart sounds and intact distal pulses.  Exam reveals no gallop and no friction rub.   No murmur heard. Pulmonary/Chest: Effort normal and breath sounds normal. No stridor. No respiratory distress. He has no wheezes. He has no rales. He exhibits tenderness (mild ttp in left upper chest).  Abdominal: Soft. Bowel sounds are normal. He exhibits no distension and no mass. There is no tenderness. There is no rebound and no guarding.  Musculoskeletal: Normal range of motion. He exhibits no edema and no tenderness.  Lymphadenopathy:    He has no cervical adenopathy.  Neurological: He is alert and oriented to person, place, and time. He exhibits normal muscle tone. Coordination normal.  Skin: Skin is warm and dry. No rash noted. No erythema. No pallor.  Psychiatric: He has a normal mood and affect. His behavior is normal. Judgment and thought content normal.    ED Course  Procedures (including critical care time) Labs Review Labs Reviewed  BASIC METABOLIC PANEL - Abnormal; Notable for the following:    Glucose, Bld 101 (*)    GFR calc non Af Amer 77 (*)    GFR calc Af Amer 89 (*)    All other components within normal limits  PROTIME-INR - Abnormal; Notable for the following:    Prothrombin Time 29.7 (*)    INR 2.82 (*)    All other components within normal limits  CBC  TROPONIN I  TROPONIN I    Imaging Review Dg Chest Port 1 View  09/09/2013   CLINICAL DATA:  Left chest pain. Coronary artery disease. Hypertension.  EXAM: PORTABLE CHEST - 1 VIEW  COMPARISON:  10/10/2012  FINDINGS: Heart size remains within  normal limits for portable technique. Both lungs are clear. Previous median sternotomy and mitral valve replacement noted.  IMPRESSION: No active disease.   Electronically Signed   By: Earle Gell M.D.   On: 09/09/2013 22:23     EKG Interpretation   Date/Time:  Tuesday September 09 2013 20:56:20 EDT Ventricular Rate:  74 PR Interval:  166 QRS Duration: 112 QT Interval:  482 QTC Calculation: 535 R Axis:   36 Text Interpretation:  Sinus rhythm Borderline intraventricular conduction  delay Minimal ST elevation, inferior leads Prolonged QT interval No  significant change since last tracing Confirmed by Anthem Frazer  MD, Chana Lindstrom ([id number])  on 09/09/2013 11:10:55 PM      MDM   Final diagnoses:  Atypical chest pain    63 year old male with chest pain.  Patient has had intermittent chest pain for 2 days.  He reports pain started after mowing his lawn.  Patient has pain to his left chest wall.  EKG and troponins are negative x2.  Pain is reproducible with palpation.  Patient has history of mitral valve replacement on Coumadin.  No prior history of coronary disease.  No improvement with sublingual nitrogen from EMS.  Patient reports pain has slowly resolved over the last few hours while in the emergency department.  Patient noted be hypertensive, reports he has not taken his evening medications.  Pt instructed to f/u with his pcm and cardiologist, given return precautions.    Kalman Drape, MD 09/10/13 [phone]

## 2013-09-11 ENCOUNTER — Telehealth: Payer: Self-pay | Admitting: Family Medicine

## 2013-09-11 NOTE — Telephone Encounter (Signed)
Please schedule him for Tuesday 7/14 @ 415. I will inform Dr. Madilyn Fireman about this.Corey Mathis

## 2013-09-11 NOTE — Telephone Encounter (Signed)
Pt called. He was seen at the ED for chest pains and needs an appt asap but neither [facility] nor the other docs has availability.  Please advise.  Thank you.

## 2013-09-16 ENCOUNTER — Ambulatory Visit: Payer: Medicare Other | Admitting: Family Medicine

## 2013-09-16 DIAGNOSIS — Z0289 Encounter for other administrative examinations: Secondary | ICD-10-CM

## 2013-09-22 ENCOUNTER — Ambulatory Visit (INDEPENDENT_AMBULATORY_CARE_PROVIDER_SITE_OTHER): Payer: Medicare Other | Admitting: *Deleted

## 2013-09-22 DIAGNOSIS — I059 Rheumatic mitral valve disease, unspecified: Secondary | ICD-10-CM

## 2013-09-22 DIAGNOSIS — Z5181 Encounter for therapeutic drug level monitoring: Secondary | ICD-10-CM | POA: Diagnosis not present

## 2013-09-22 DIAGNOSIS — Z9889 Other specified postprocedural states: Secondary | ICD-10-CM

## 2013-09-22 DIAGNOSIS — Z7901 Long term (current) use of anticoagulants: Secondary | ICD-10-CM | POA: Diagnosis not present

## 2013-09-22 LAB — PROTIME-INR
INR: 6.6 ratio (ref 0.8–1.0)
PROTHROMBIN TIME: 69.3 s — AB (ref 9.6–13.1)

## 2013-09-22 LAB — POCT INR: INR: 6.5

## 2013-09-23 ENCOUNTER — Ambulatory Visit (INDEPENDENT_AMBULATORY_CARE_PROVIDER_SITE_OTHER): Payer: Medicare Other | Admitting: Family Medicine

## 2013-09-23 ENCOUNTER — Encounter: Payer: Self-pay | Admitting: Family Medicine

## 2013-09-23 ENCOUNTER — Telehealth: Payer: Self-pay | Admitting: Family Medicine

## 2013-09-23 VITALS — BP 143/93 | HR 80 | Ht 76.0 in | Wt 202.0 lb

## 2013-09-23 DIAGNOSIS — R7309 Other abnormal glucose: Secondary | ICD-10-CM | POA: Diagnosis not present

## 2013-09-23 DIAGNOSIS — E785 Hyperlipidemia, unspecified: Secondary | ICD-10-CM | POA: Diagnosis not present

## 2013-09-23 DIAGNOSIS — I10 Essential (primary) hypertension: Secondary | ICD-10-CM

## 2013-09-23 DIAGNOSIS — Z23 Encounter for immunization: Secondary | ICD-10-CM

## 2013-09-23 DIAGNOSIS — R0789 Other chest pain: Secondary | ICD-10-CM | POA: Diagnosis not present

## 2013-09-23 DIAGNOSIS — R7301 Impaired fasting glucose: Secondary | ICD-10-CM | POA: Insufficient documentation

## 2013-09-23 LAB — POCT GLYCOSYLATED HEMOGLOBIN (HGB A1C): HEMOGLOBIN A1C: 6

## 2013-09-23 MED ORDER — TERAZOSIN HCL 2 MG PO CAPS: 2.0000 mg | ORAL_CAPSULE | Freq: Every day | ORAL | Status: DC

## 2013-09-23 MED ORDER — BUDESONIDE-FORMOTEROL FUMARATE 160-4.5 MCG/ACT IN AERO: 1.0000 | INHALATION_SPRAY | Freq: Two times a day (BID) | RESPIRATORY_TRACT | Status: DC

## 2013-09-23 MED ORDER — AMBULATORY NON FORMULARY MEDICATION: Status: DC

## 2013-09-23 MED ORDER — HYDROCORTISONE-ACETIC ACID 1-2 % OT SOLN: 4.0000 [drp] | Freq: Three times a day (TID) | OTIC | Status: DC

## 2013-09-23 NOTE — Telephone Encounter (Signed)
Please call patient and let him know that his fingerstick which is an A1c was elevated. This puts him into the prediabetic range. I really want him to work on cutting out sweets and watch his portion sizes on carbohydrates and make sure getting regular exercise for 20 minutes 5 days per week. We will recheck A1c in 6 months to see if he is getting under control.

## 2013-09-23 NOTE — Telephone Encounter (Signed)
Pt informed.Corey Mathis  

## 2013-09-23 NOTE — Progress Notes (Signed)
   Subjective:    Patient ID: Corey Mathis, male    DOB: 20-Dec-1950, 63 y.o.   MRN: [id number]  HPI Was mowing his lawn and started having some chest pain.  Went to ED next day bc was still having pain.  No chest pain since them. No GERD sxs.  He has been doing well since then.  Toes feeling tingling when he raises them up at night.  Sometimes happens when crosses his legs. He has been eating some sugary foods   Dyslipidemia-tolerating statin well without side effects or problems. He's currently on atorvastatin. Due to recheck lipids this month.  Left ear pain and tenderness for 4 days. He thinks it may be infected. No hearing loss no. No fevers chills or sweats. Review of Systems     Objective:   Physical Exam  Constitutional: He is oriented to person, place, and time. He appears well-developed and well-nourished.  HENT:  Head: Normocephalic and atraumatic.  Right Ear: External ear normal.  Left Ear: External ear normal.  Right canal has a fair amount of cerumen the tympanic membrane is normal. Left canal is very irritated and erythematous and raw appearing. The tympanic membrane appears normal with no fluid or erythema or swelling.  Cardiovascular: Normal rate, regular rhythm and normal heart sounds.   No carotid bruits.  Pulmonary/Chest: Effort normal and breath sounds normal.  Musculoskeletal: He exhibits no edema.  Neurological: He is alert and oriented to person, place, and time.  Skin: Skin is warm and dry.  Psychiatric: He has a normal mood and affect. His behavior is normal.          Assessment & Plan:  Atypical chest pain-he ruled out for acute MI. He has not had any more symptoms since then. Me at the muscle strain from mowing the lawn.  Discussed need for shingles vaccine. Encouraged him to check with his pharmacy as he does have Medicare part D.  Given Prevnar 13 today.  Abnormal Glucose while in the emergency room-will perform a hemoglobin A1c today. New dx  of IFG. Will recheck in 6 months.  Will call to give him some dietary information.  Lab Results  Component Value Date   HGBA1C 6.0 09/23/2013   Dyslipidemia-  He is due for lipid panel this month. Lab slip printed and given today.  Left otitis externa-will send in a Vosol drops. Call if not better in one week.  Hypertension-not well controlled today. We'll repeat blood pressure and if not a goal consider increasing metoprolol.

## 2013-09-29 ENCOUNTER — Ambulatory Visit (INDEPENDENT_AMBULATORY_CARE_PROVIDER_SITE_OTHER): Payer: Medicare Other

## 2013-09-29 DIAGNOSIS — Z9889 Other specified postprocedural states: Secondary | ICD-10-CM | POA: Diagnosis not present

## 2013-09-29 DIAGNOSIS — I059 Rheumatic mitral valve disease, unspecified: Secondary | ICD-10-CM

## 2013-09-29 DIAGNOSIS — Z7901 Long term (current) use of anticoagulants: Secondary | ICD-10-CM

## 2013-09-29 DIAGNOSIS — Z5181 Encounter for therapeutic drug level monitoring: Secondary | ICD-10-CM | POA: Diagnosis not present

## 2013-09-29 LAB — POCT INR: INR: 2.1

## 2013-10-13 ENCOUNTER — Ambulatory Visit (INDEPENDENT_AMBULATORY_CARE_PROVIDER_SITE_OTHER): Payer: Medicare Other | Admitting: Pharmacist

## 2013-10-13 DIAGNOSIS — I059 Rheumatic mitral valve disease, unspecified: Secondary | ICD-10-CM

## 2013-10-13 DIAGNOSIS — Z5181 Encounter for therapeutic drug level monitoring: Secondary | ICD-10-CM | POA: Diagnosis not present

## 2013-10-13 DIAGNOSIS — Z9889 Other specified postprocedural states: Secondary | ICD-10-CM

## 2013-10-13 DIAGNOSIS — Z7901 Long term (current) use of anticoagulants: Secondary | ICD-10-CM | POA: Diagnosis not present

## 2013-10-13 LAB — POCT INR: INR: 5.1

## 2013-10-15 ENCOUNTER — Other Ambulatory Visit: Payer: Self-pay | Admitting: Family Medicine

## 2013-10-20 ENCOUNTER — Ambulatory Visit (INDEPENDENT_AMBULATORY_CARE_PROVIDER_SITE_OTHER): Payer: Medicare Other | Admitting: *Deleted

## 2013-10-20 DIAGNOSIS — Z9889 Other specified postprocedural states: Secondary | ICD-10-CM

## 2013-10-20 DIAGNOSIS — Z7901 Long term (current) use of anticoagulants: Secondary | ICD-10-CM | POA: Diagnosis not present

## 2013-10-20 DIAGNOSIS — I059 Rheumatic mitral valve disease, unspecified: Secondary | ICD-10-CM | POA: Diagnosis not present

## 2013-10-20 DIAGNOSIS — Z5181 Encounter for therapeutic drug level monitoring: Secondary | ICD-10-CM

## 2013-10-20 LAB — POCT INR: INR: 1.5

## 2013-10-30 ENCOUNTER — Ambulatory Visit (INDEPENDENT_AMBULATORY_CARE_PROVIDER_SITE_OTHER): Payer: Medicare Other

## 2013-10-30 DIAGNOSIS — Z9889 Other specified postprocedural states: Secondary | ICD-10-CM

## 2013-10-30 DIAGNOSIS — I059 Rheumatic mitral valve disease, unspecified: Secondary | ICD-10-CM

## 2013-10-30 DIAGNOSIS — Z5181 Encounter for therapeutic drug level monitoring: Secondary | ICD-10-CM

## 2013-10-30 DIAGNOSIS — Z7901 Long term (current) use of anticoagulants: Secondary | ICD-10-CM | POA: Diagnosis not present

## 2013-10-30 LAB — POCT INR: INR: 3.5

## 2013-11-04 ENCOUNTER — Emergency Department (INDEPENDENT_AMBULATORY_CARE_PROVIDER_SITE_OTHER)
Admission: EM | Admit: 2013-11-04 | Discharge: 2013-11-04 | Disposition: A | Discharge status: Home / Self Care | Payer: Medicare Other | Source: Home / Self Care | Attending: Physician Assistant | Admitting: Physician Assistant

## 2013-11-04 ENCOUNTER — Encounter: Payer: Self-pay | Admitting: Emergency Medicine

## 2013-11-04 DIAGNOSIS — T50905A Adverse effect of unspecified drugs, medicaments and biological substances, initial encounter: Secondary | ICD-10-CM

## 2013-11-04 DIAGNOSIS — R110 Nausea: Secondary | ICD-10-CM

## 2013-11-04 MED ORDER — ONDANSETRON 4 MG PO TBDP
4.0000 mg | ORAL_TABLET | Freq: Once | ORAL | Status: AC
  Administered 2013-11-04: 4 mg via ORAL

## 2013-11-04 MED ORDER — ONDANSETRON 4 MG PO TBDP: 4.0000 mg | ORAL_TABLET | Freq: Three times a day (TID) | ORAL | Status: DC | PRN

## 2013-11-04 NOTE — ED Notes (Signed)
Patient took Norco 7.5mg /325mg  around 2pm for back pain.  Now feeling nausea.

## 2013-11-04 NOTE — ED Provider Notes (Signed)
CSN: [id number]     Arrival date & time 11/04/13  1924 History   First MD Initiated Contact with Patient 11/04/13 1936     Chief Complaint  Patient presents with  . Nausea   (Consider location/radiation/quality/duration/timing/severity/associated sxs/prior Treatment) HPI Pt presents to the clinic with nausea that started about 30 minutes to an hour after taking norco 7/5/325 at 2pm. This has happened before after taking norco. When he gets nauseated he gets anxious. Last time they gave him a shot that helped with symptoms. Denies any fever, chills, SOB, abdominal pain, bowel changes, urinary symptoms.   Past Medical History  Diagnosis Date  . Labyrinthitis     hx  . Asthma   . Anxiety   . Allergy history unknown   . Mitral valve replaced     hx  . Dyslipidemia   . HTN (hypertension)   . Allergy   . CHF (congestive heart failure)    Past Surgical History  Procedure Laterality Date  . Jaw surgery (other)    . Acne cyst removal      from hand and back  . Valve replacement  1998    St. Jude, mitral  . Nasal sinus surgery N/A 10/16/2012    Procedure: NASAL ENDOSCOPIC POLYPECTOMY/MAXILLARY ANTROSTOMY/ETHMOIDECTOMY;  Surgeon: Izora Gala, MD;  Location: [facility];  Service: ENT;  Laterality: N/A;  . Tooth extraction     Family History  Problem Relation Age of Onset  . Heart attack Father 28  . Colon cancer Mother 19  . Hypertension Mother    History  Substance Use Topics  . Smoking status: Former Smoker    Quit date: 04/26/1991  . Smokeless tobacco: Never Used  . Alcohol Use: No    Review of Systems  All other systems reviewed and are negative.   Allergies  Lisinopril; Carvedilol; Codeine sulfate; Ezetimibe-simvastatin; Propranolol hcl; and Ramipril  Home Medications   Prior to Admission medications   Medication Sig Start Date End Date Taking? Authorizing Provider  acetic acid-hydrocortisone (VOSOL-HC) otic solution Place 4 drops into the left ear 3 (three) times  daily. 09/23/13   [name], MD  albuterol (PROAIR HFA) 108 (90 BASE) MCG/ACT inhaler Inhale 2 puffs into the lungs every 6 (six) hours as needed for wheezing. 10/18/12 01/13/14  [name], MD  AMBULATORY NON FORMULARY MEDICATION Medication Name: Shingles IM x 1 09/23/13   [name], MD  aspirin 81 MG EC tablet Take 81 mg by mouth daily.     Historical Provider, MD  atorvastatin (LIPITOR) 20 MG tablet Take 1 tablet (20 mg total) by mouth daily. 03/31/13 05/21/15  [name], MD  budesonide-formoterol (SYMBICORT) 160-4.5 MCG/ACT inhaler Inhale 1 puff into the lungs 2 (two) times daily. 09/23/13   [name], MD  CALCIUM-VITAMIN D PO Take 1 tablet by mouth daily.    Historical Provider, MD  diltiazem (CARDIZEM CD) 240 MG 24 hr capsule Take 240 mg by mouth daily.    Historical Provider, MD  diltiazem (CARDIZEM CD) 240 MG 24 hr capsule TAKE ONE CAPSULE BY MOUTH ONCE DAILY 10/16/13   [name], MD  hydrochlorothiazide (MICROZIDE) 12.5 MG capsule Take 1 capsule (12.5 mg total) by mouth daily. 10/17/12   Lelon Perla, MD  ibuprofen (ADVIL,MOTRIN) 200 MG tablet Take 400 mg by mouth every 6 (six) hours as needed for headache or mild pain.     Historical Provider, MD  Iron-Vitamins (GERITOL PO) Take 15 mLs by mouth  daily.     Historical Provider, MD  metoprolol (LOPRESSOR) 50 MG tablet Take 50 mg by mouth 2 (two) times daily.    Historical Provider, MD  Multiple Vitamin (MULTIVITAMIN) tablet Take 1 tablet by mouth daily.    Historical Provider, MD  ondansetron (ZOFRAN-ODT) 4 MG disintegrating tablet Take 1 tablet (4 mg total) by mouth every 8 (eight) hours as needed for nausea or vomiting. 11/04/13   Chayce Rullo L Keiffer Piper, PA-C  terazosin (HYTRIN) 2 MG capsule Take 1 capsule (2 mg total) by mouth at bedtime. 09/23/13   [name], MD  warfarin (COUMADIN) 5 MG tablet Take 10 mg by mouth at bedtime.    Historical Provider, MD   BP 161/91  Pulse 56   Temp(Src) 97.5 F (36.4 C) (Oral)  Resp 16  Ht 6\' 4"  (1.93 m)  Wt 199 lb (90.266 kg)  BMI 24.23 kg/m2  SpO2 100% Physical Exam  Constitutional: He is oriented to person, place, and time. He appears well-developed and well-nourished.  HENT:  Head: Normocephalic and atraumatic.  Cardiovascular: Normal rate, regular rhythm and normal heart sounds.   Pulmonary/Chest: Effort normal and breath sounds normal. He has no wheezes.  Abdominal: Soft. Bowel sounds are normal. He exhibits no distension and no mass. There is no tenderness. There is no rebound and no guarding.  Neurological: He is alert and oriented to person, place, and time.  Skin: Skin is dry.  Psychiatric: He has a normal mood and affect. His behavior is normal.    ED Course  Procedures (including critical care time) Labs Review Labs Reviewed - No data to display  Imaging Review No results found.   MDM   1. Nausea alone   2. Medication side effect, initial encounter    Pt was given zofran ODT in office at 7:45 at 8:00 pt had a positive response.  Sent pt home with zofran 4mg  ODT every 4-8hours.  Don't take any more Norco.  Call with any worsening symptoms or vomiting.  Continues to stay hydrated.  Follow up with PCP in not improving.     Donella Stade, PA-C 11/05/13 [phone]

## 2013-11-05 NOTE — Discharge Instructions (Signed)
Given zofran as need for nausea. 1 tablet every 4-8 hours.  Follow up with worsening symptoms.  Stop taking any Norco.

## 2013-11-08 NOTE — ED Provider Notes (Signed)
Agree with exam, assessment, and plan.   Kandra Nicolas, MD 11/08/13 [phone]

## 2013-11-13 ENCOUNTER — Ambulatory Visit (INDEPENDENT_AMBULATORY_CARE_PROVIDER_SITE_OTHER): Payer: Medicare Other | Admitting: Pharmacist

## 2013-11-13 DIAGNOSIS — Z7901 Long term (current) use of anticoagulants: Secondary | ICD-10-CM

## 2013-11-13 DIAGNOSIS — I059 Rheumatic mitral valve disease, unspecified: Secondary | ICD-10-CM

## 2013-11-13 DIAGNOSIS — Z5181 Encounter for therapeutic drug level monitoring: Secondary | ICD-10-CM | POA: Diagnosis not present

## 2013-11-13 DIAGNOSIS — Z9889 Other specified postprocedural states: Secondary | ICD-10-CM | POA: Diagnosis not present

## 2013-11-13 LAB — POCT INR: INR: 2.7

## 2013-12-02 ENCOUNTER — Other Ambulatory Visit: Payer: Self-pay | Admitting: Pharmacist

## 2013-12-02 MED ORDER — WARFARIN SODIUM 5 MG PO TABS: ORAL_TABLET | ORAL | Status: DC

## 2013-12-09 ENCOUNTER — Ambulatory Visit: Payer: Medicare Other | Admitting: Cardiology

## 2013-12-11 ENCOUNTER — Ambulatory Visit (INDEPENDENT_AMBULATORY_CARE_PROVIDER_SITE_OTHER): Payer: Medicare Other | Admitting: *Deleted

## 2013-12-11 DIAGNOSIS — Z5181 Encounter for therapeutic drug level monitoring: Secondary | ICD-10-CM | POA: Diagnosis not present

## 2013-12-11 DIAGNOSIS — I059 Rheumatic mitral valve disease, unspecified: Secondary | ICD-10-CM | POA: Diagnosis not present

## 2013-12-11 DIAGNOSIS — Z9889 Other specified postprocedural states: Secondary | ICD-10-CM

## 2013-12-11 LAB — POCT INR: INR: 4.8

## 2013-12-18 ENCOUNTER — Other Ambulatory Visit: Payer: Self-pay

## 2013-12-18 MED ORDER — HYDROCHLOROTHIAZIDE 12.5 MG PO CAPS: 12.5000 mg | ORAL_CAPSULE | Freq: Every day | ORAL | Status: DC

## 2013-12-25 ENCOUNTER — Ambulatory Visit (INDEPENDENT_AMBULATORY_CARE_PROVIDER_SITE_OTHER): Payer: Medicare Other | Admitting: Pharmacist Clinician (PhC)/ Clinical Pharmacy Specialist

## 2013-12-25 DIAGNOSIS — Z5181 Encounter for therapeutic drug level monitoring: Secondary | ICD-10-CM | POA: Diagnosis not present

## 2013-12-25 DIAGNOSIS — I059 Rheumatic mitral valve disease, unspecified: Secondary | ICD-10-CM

## 2013-12-25 DIAGNOSIS — Z9889 Other specified postprocedural states: Secondary | ICD-10-CM

## 2013-12-25 LAB — POCT INR: INR: 3.4

## 2014-01-05 NOTE — Progress Notes (Signed)
HPI: FU mitral valve replacement with a St. Jude valve in 1998. A pre-operative cardiac catheterization revealed normal coronary arteries. The last echocardiogram in Oct 2014 showed normal LV function. The left atrium was mildly dilated. 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 Since I last saw him, the patient has dyspnea with more extreme activities but not with routine activities. It is relieved with rest. It is not associated with chest pain. There is no orthopnea, PND or pedal edema. There is no syncope or palpitations. There is no exertional chest pain.   Current Outpatient Prescriptions  Medication Sig Dispense Refill  . albuterol (PROAIR HFA) 108 (90 BASE) MCG/ACT inhaler Inhale 2 puffs into the lungs every 6 (six) hours as needed for wheezing. 1 Inhaler 3  . AMBULATORY NON FORMULARY MEDICATION Medication Name: Shingles IM x 1 1 vial 0  . aspirin 81 MG EC tablet Take 81 mg by mouth daily.     Marland Kitchen atorvastatin (LIPITOR) 20 MG tablet Take one tablet by mouth once daily **Appointment necessary for refills** 90 tablet 0  . budesonide-formoterol (SYMBICORT) 160-4.5 MCG/ACT inhaler Inhale 1 puff into the lungs 2 (two) times daily. 1 Inhaler 3  . CALCIUM-VITAMIN D PO Take 1 tablet by mouth daily.    Marland Kitchen diltiazem (CARDIZEM CD) 240 MG 24 hr capsule Take 240 mg by mouth daily.    . hydrochlorothiazide (MICROZIDE) 12.5 MG capsule Take 1 capsule (12.5 mg total) by mouth daily. 30 capsule 0  . ibuprofen (ADVIL,MOTRIN) 200 MG tablet Take 400 mg by mouth every 6 (six) hours as needed for headache or mild pain.     . Iron-Vitamins (GERITOL PO) Take 15 mLs by mouth daily.     . metoprolol (LOPRESSOR) 50 MG tablet Take 50 mg by mouth 2 (two) times daily.    . Multiple Vitamin (MULTIVITAMIN) tablet Take 1 tablet by mouth daily.    . ondansetron (ZOFRAN-ODT) 4 MG disintegrating tablet Take 1 tablet (4 mg total) by mouth every 8 (eight) hours as needed for nausea or vomiting. 20 tablet 0  . terazosin  (HYTRIN) 2 MG capsule Take 1 capsule (2 mg total) by mouth at bedtime. 30 capsule 3  . warfarin (COUMADIN) 5 MG tablet Take as directed by [facility] 60 tablet 2   No current facility-administered medications for this visit.     Past Medical History  Diagnosis Date  . Labyrinthitis     hx  . Asthma   . Anxiety   . Allergy history unknown   . Mitral valve replaced     hx  . Dyslipidemia   . HTN (hypertension)   . Allergy   . CHF (congestive heart failure)     Past Surgical History  Procedure Laterality Date  . Jaw surgery (other)    . Acne cyst removal      from hand and back  . Valve replacement  1998    St. Jude, mitral  . Nasal sinus surgery N/A 10/16/2012    Procedure: NASAL ENDOSCOPIC POLYPECTOMY/MAXILLARY ANTROSTOMY/ETHMOIDECTOMY;  Surgeon: Izora Gala, MD;  Location: [place];  Service: ENT;  Laterality: N/A;  . Tooth extraction      History   Social History  . Marital Status: Married    Spouse Name: Frenchie     Number of Children: 2  . Years of Education: N/A   Occupational History  . SALES [employer]    Social History Main Topics  . Smoking status: Former Smoker  Quit date: 04/26/1991  . Smokeless tobacco: Never Used  . Alcohol Use: No  . Drug Use: No  . Sexual Activity: Not on file   Other Topics Concern  . Not on file   Social History Narrative   Some exercise, bike and walking.  2 cups per day.     ROS: no fevers or chills, productive cough, hemoptysis, dysphasia, odynophagia, melena, hematochezia, dysuria, hematuria, rash, seizure activity, orthopnea, PND, pedal edema, claudication. Remaining systems are negative.  Physical Exam: Well-developed well-nourished in no acute distress.  Skin is warm and dry.  HEENT is normal.  Neck is supple.  Chest is clear to auscultation with normal expansion.  Cardiovascular exam is regular rate and rhythm. Crisp mechanical valve sound Abdominal exam nontender or distended. No masses palpated. Positive  bruit Extremities show no edema. neuro grossly intact  ECG Normal sinus rhythm, no ST changes.

## 2014-01-06 ENCOUNTER — Other Ambulatory Visit: Payer: Self-pay

## 2014-01-06 ENCOUNTER — Other Ambulatory Visit: Payer: Self-pay | Admitting: Family Medicine

## 2014-01-06 MED ORDER — WARFARIN SODIUM 5 MG PO TABS: ORAL_TABLET | ORAL | Status: DC

## 2014-01-08 ENCOUNTER — Encounter: Payer: Self-pay | Admitting: Cardiology

## 2014-01-08 ENCOUNTER — Encounter: Payer: Self-pay | Admitting: *Deleted

## 2014-01-08 ENCOUNTER — Ambulatory Visit (INDEPENDENT_AMBULATORY_CARE_PROVIDER_SITE_OTHER): Payer: Medicare Other | Admitting: Cardiology

## 2014-01-08 VITALS — BP 152/93 | HR 71 | Ht 76.0 in | Wt 196.5 lb

## 2014-01-08 DIAGNOSIS — I059 Rheumatic mitral valve disease, unspecified: Secondary | ICD-10-CM

## 2014-01-08 DIAGNOSIS — R0989 Other specified symptoms and signs involving the circulatory and respiratory systems: Secondary | ICD-10-CM | POA: Diagnosis not present

## 2014-01-08 MED ORDER — AMLODIPINE BESYLATE 10 MG PO TABS: 10.0000 mg | ORAL_TABLET | Freq: Every day | ORAL | Status: DC

## 2014-01-08 MED ORDER — ATORVASTATIN CALCIUM 20 MG PO TABS: ORAL_TABLET | ORAL | Status: DC

## 2014-01-08 MED ORDER — ALPRAZOLAM 0.5 MG PO TABS: 0.5000 mg | ORAL_TABLET | Freq: Every day | ORAL | Status: DC | PRN

## 2014-01-08 NOTE — Assessment & Plan Note (Signed)
Blood pressure elevated. Discontinue Cardizem. Add Norvasc 10 mg daily. Follow blood pressure. If it remains elevated despite medication changes as outlined would increase metoprolol.

## 2014-01-08 NOTE — Patient Instructions (Signed)
Your physician wants you to follow-up in: [place] will receive a reminder letter in the mail two months in advance. If you don't receive a letter, please call our office to schedule the follow-up appointment.   STOP DILTIAZEM   START AMLODIPINE 10 MG ONCE DAILY  START XANAX 0.5 MG ONCE DAILY AS NEEDED  Your physician has requested that you have an abdominal aorta duplex. During this test, an ultrasound is used to evaluate the aorta. Allow 30 minutes for this exam. Do not eat after midnight the day before and avoid carbonated beverages

## 2014-01-08 NOTE — Assessment & Plan Note (Signed)
Status post mitral valve replacement. Continue SBE prophylaxis. Continue Coumadin and aspirin.

## 2014-01-08 NOTE — Assessment & Plan Note (Signed)
Patient has difficulties lying because of anxiety. I have given a prescription for Xanax 0.5 mg daily as needed.

## 2014-01-08 NOTE — Addendum Note (Signed)
Addended by: Cristopher Estimable on: 01/08/2014 09:07 AM   Modules accepted: Orders

## 2014-01-08 NOTE — Assessment & Plan Note (Signed)
Continue statin. Lipids and liver monitored by primary care. 

## 2014-01-08 NOTE — Assessment & Plan Note (Signed)
Abdominal ultrasound to exclude aneurysm. 

## 2014-01-15 ENCOUNTER — Ambulatory Visit (HOSPITAL_COMMUNITY)
Admission: RE | Admit: 2014-01-15 | Discharge: 2014-01-15 | Disposition: A | Discharge status: Home / Self Care | Payer: Medicare Other | Source: Ambulatory Visit | Attending: Cardiology | Admitting: Cardiology

## 2014-01-15 DIAGNOSIS — R0989 Other specified symptoms and signs involving the circulatory and respiratory systems: Secondary | ICD-10-CM

## 2014-01-15 DIAGNOSIS — Z952 Presence of prosthetic heart valve: Secondary | ICD-10-CM | POA: Insufficient documentation

## 2014-01-15 DIAGNOSIS — I2510 Atherosclerotic heart disease of native coronary artery without angina pectoris: Secondary | ICD-10-CM | POA: Insufficient documentation

## 2014-01-15 NOTE — Progress Notes (Addendum)
Aorta Duplex Completed. Negative for AAA. Corey Mathis, BS, RDMS, RVT  

## 2014-01-26 ENCOUNTER — Ambulatory Visit (INDEPENDENT_AMBULATORY_CARE_PROVIDER_SITE_OTHER): Payer: Medicare Other

## 2014-01-26 DIAGNOSIS — Z5181 Encounter for therapeutic drug level monitoring: Secondary | ICD-10-CM | POA: Diagnosis not present

## 2014-01-26 DIAGNOSIS — Z23 Encounter for immunization: Secondary | ICD-10-CM

## 2014-01-26 DIAGNOSIS — Z9889 Other specified postprocedural states: Secondary | ICD-10-CM

## 2014-01-26 DIAGNOSIS — I059 Rheumatic mitral valve disease, unspecified: Secondary | ICD-10-CM | POA: Diagnosis not present

## 2014-01-26 LAB — POCT INR: INR: 4.4

## 2014-02-10 ENCOUNTER — Other Ambulatory Visit: Payer: Self-pay

## 2014-02-10 MED ORDER — HYDROCHLOROTHIAZIDE 12.5 MG PO CAPS: 12.5000 mg | ORAL_CAPSULE | Freq: Every day | ORAL | Status: DC

## 2014-02-12 ENCOUNTER — Ambulatory Visit (INDEPENDENT_AMBULATORY_CARE_PROVIDER_SITE_OTHER): Payer: Medicare Other | Admitting: *Deleted

## 2014-02-12 DIAGNOSIS — I059 Rheumatic mitral valve disease, unspecified: Secondary | ICD-10-CM

## 2014-02-12 DIAGNOSIS — Z5181 Encounter for therapeutic drug level monitoring: Secondary | ICD-10-CM

## 2014-02-12 DIAGNOSIS — Z9889 Other specified postprocedural states: Secondary | ICD-10-CM

## 2014-02-12 LAB — POCT INR: INR: 2.6

## 2014-03-04 ENCOUNTER — Ambulatory Visit (INDEPENDENT_AMBULATORY_CARE_PROVIDER_SITE_OTHER): Payer: Medicare Other | Admitting: *Deleted

## 2014-03-04 DIAGNOSIS — Z9889 Other specified postprocedural states: Secondary | ICD-10-CM

## 2014-03-04 DIAGNOSIS — Z5181 Encounter for therapeutic drug level monitoring: Secondary | ICD-10-CM | POA: Diagnosis not present

## 2014-03-04 DIAGNOSIS — I059 Rheumatic mitral valve disease, unspecified: Secondary | ICD-10-CM | POA: Diagnosis not present

## 2014-03-04 LAB — POCT INR: INR: 4.3

## 2014-03-13 ENCOUNTER — Other Ambulatory Visit: Payer: Self-pay | Admitting: Family Medicine

## 2014-03-18 ENCOUNTER — Ambulatory Visit (INDEPENDENT_AMBULATORY_CARE_PROVIDER_SITE_OTHER): Payer: Medicare Other | Admitting: Pharmacist

## 2014-03-18 DIAGNOSIS — Z9889 Other specified postprocedural states: Secondary | ICD-10-CM

## 2014-03-18 DIAGNOSIS — Z5181 Encounter for therapeutic drug level monitoring: Secondary | ICD-10-CM

## 2014-03-18 DIAGNOSIS — I059 Rheumatic mitral valve disease, unspecified: Secondary | ICD-10-CM

## 2014-03-18 LAB — POCT INR: INR: 3.4

## 2014-04-29 ENCOUNTER — Other Ambulatory Visit: Payer: Self-pay

## 2014-04-29 MED ORDER — TERAZOSIN HCL 2 MG PO CAPS: 2.0000 mg | ORAL_CAPSULE | Freq: Every day | ORAL | Status: DC

## 2014-04-30 ENCOUNTER — Ambulatory Visit (INDEPENDENT_AMBULATORY_CARE_PROVIDER_SITE_OTHER): Payer: Medicare Other | Admitting: *Deleted

## 2014-04-30 DIAGNOSIS — I059 Rheumatic mitral valve disease, unspecified: Secondary | ICD-10-CM | POA: Diagnosis not present

## 2014-04-30 DIAGNOSIS — Z5181 Encounter for therapeutic drug level monitoring: Secondary | ICD-10-CM | POA: Diagnosis not present

## 2014-04-30 DIAGNOSIS — Z9889 Other specified postprocedural states: Secondary | ICD-10-CM | POA: Diagnosis not present

## 2014-04-30 LAB — POCT INR: INR: 3.2

## 2014-05-01 ENCOUNTER — Other Ambulatory Visit: Payer: Self-pay | Admitting: *Deleted

## 2014-05-01 MED ORDER — ATORVASTATIN CALCIUM 20 MG PO TABS: ORAL_TABLET | ORAL | Status: DC

## 2014-05-06 ENCOUNTER — Other Ambulatory Visit: Payer: Self-pay | Admitting: *Deleted

## 2014-05-06 MED ORDER — METOPROLOL TARTRATE 50 MG PO TABS: 50.0000 mg | ORAL_TABLET | Freq: Two times a day (BID) | ORAL | Status: DC

## 2014-05-14 ENCOUNTER — Other Ambulatory Visit: Payer: Self-pay | Admitting: Family Medicine

## 2014-05-26 ENCOUNTER — Ambulatory Visit (INDEPENDENT_AMBULATORY_CARE_PROVIDER_SITE_OTHER): Payer: Medicare Other | Admitting: Family Medicine

## 2014-05-26 ENCOUNTER — Telehealth: Payer: Self-pay | Admitting: Family Medicine

## 2014-05-26 ENCOUNTER — Encounter: Payer: Self-pay | Admitting: Family Medicine

## 2014-05-26 VITALS — BP 148/92 | HR 67 | Ht 76.0 in | Wt 203.0 lb

## 2014-05-26 DIAGNOSIS — Z1159 Encounter for screening for other viral diseases: Secondary | ICD-10-CM

## 2014-05-26 DIAGNOSIS — E785 Hyperlipidemia, unspecified: Secondary | ICD-10-CM

## 2014-05-26 DIAGNOSIS — Z0000 Encounter for general adult medical examination without abnormal findings: Secondary | ICD-10-CM

## 2014-05-26 DIAGNOSIS — N400 Enlarged prostate without lower urinary tract symptoms: Secondary | ICD-10-CM

## 2014-05-26 DIAGNOSIS — L723 Sebaceous cyst: Secondary | ICD-10-CM

## 2014-05-26 DIAGNOSIS — R7301 Impaired fasting glucose: Secondary | ICD-10-CM | POA: Diagnosis not present

## 2014-05-26 DIAGNOSIS — I10 Essential (primary) hypertension: Secondary | ICD-10-CM

## 2014-05-26 DIAGNOSIS — R221 Localized swelling, mass and lump, neck: Secondary | ICD-10-CM

## 2014-05-26 LAB — POCT GLYCOSYLATED HEMOGLOBIN (HGB A1C)

## 2014-05-26 MED ORDER — TERAZOSIN HCL 2 MG PO CAPS: 2.0000 mg | ORAL_CAPSULE | Freq: Every day | ORAL | Status: DC

## 2014-05-26 MED ORDER — METOPROLOL TARTRATE 100 MG PO TABS: 100.0000 mg | ORAL_TABLET | Freq: Two times a day (BID) | ORAL | Status: DC

## 2014-05-26 NOTE — Telephone Encounter (Signed)
Order placed

## 2014-05-26 NOTE — Progress Notes (Signed)
Subjective:    Corey Mathis is a 64 y.o. male who presents for Medicare Annual/Subsequent preventive examination.    Preventive Screening-Counseling & Management  Tobacco History  Smoking status  . Former Smoker  . Quit date: 04/26/1991  Smokeless tobacco  . Never Used    Problems Prior to Visit 1. He does have a knot on the right side of his neck just below his chin. He says it's been there for about 2 months that he's been able to tell. It is not tender or painful. No fever or redness. He doesn't think it's gotten larger but has been firm and he's been keeping an eye on it. 2. He also has a knot on the left foot. Your members hitting it on something and then it swell after that. He wanted me to take a look at it. It's not really painful or bothering him anymore.  Current Problems (verified) Patient Active Problem List   Diagnosis Date Noted  . Bruit 01/08/2014  . IFG (impaired fasting glucose) 09/23/2013  . Lung nodule 03/31/2013  . BPH (benign prostatic hyperplasia) 09/27/2012  . Sebaceous cyst 04/26/2011  . Lipoma of back 04/26/2011  . Encounter for long-term (current) use of anticoagulants 01/25/2011  . Mitral valve disorder 04/14/2010  . WEIGHT LOSS 02/11/2010  . Hyperlipidemia 06/03/2007  . Anxiety state 06/03/2007  . Essential hypertension 06/03/2007  . ASTHMA 06/03/2007  . ALLERGY 06/03/2007  . LABYRINTHITIS, HX OF 06/03/2007  . MITRAL VALVE REPLACEMENT, HX OF 06/03/2007    Medications Prior to Visit Current Outpatient Prescriptions on File Prior to Visit  Medication Sig Dispense Refill  . ALPRAZolam (XANAX) 0.5 MG tablet Take 1 tablet (0.5 mg total) by mouth daily as needed for anxiety. 10 tablet 0  . AMBULATORY NON FORMULARY MEDICATION Medication Name: Shingles IM x 1 1 vial 0  . amLODipine (NORVASC) 10 MG tablet Take 1 tablet (10 mg total) by mouth daily. 180 tablet 3  . aspirin 81 MG EC tablet Take 81 mg by mouth daily.     Marland Kitchen atorvastatin (LIPITOR) 20  MG tablet Take one tablet by mouth once daily 90 tablet 4  . budesonide-formoterol (SYMBICORT) 160-4.5 MCG/ACT inhaler Inhale 1 puff into the lungs 2 (two) times daily. 1 Inhaler 3  . CALCIUM-VITAMIN D PO Take 1 tablet by mouth daily.    . hydrochlorothiazide (MICROZIDE) 12.5 MG capsule Take 1 capsule (12.5 mg total) by mouth daily. 30 capsule 6  . ibuprofen (ADVIL,MOTRIN) 200 MG tablet Take 400 mg by mouth every 6 (six) hours as needed for headache or mild pain.     . Iron-Vitamins (GERITOL PO) Take 15 mLs by mouth daily.     . Multiple Vitamin (MULTIVITAMIN) tablet Take 1 tablet by mouth daily.    . ondansetron (ZOFRAN-ODT) 4 MG disintegrating tablet Take 1 tablet (4 mg total) by mouth every 8 (eight) hours as needed for nausea or vomiting. 20 tablet 0  . warfarin (COUMADIN) 5 MG tablet Take as directed by [facility] 60 tablet 2  . albuterol (PROAIR HFA) 108 (90 BASE) MCG/ACT inhaler Inhale 2 puffs into the lungs every 6 (six) hours as needed for wheezing. 1 Inhaler 3   No current facility-administered medications on file prior to visit.    Current Medications (verified) Current Outpatient Prescriptions  Medication Sig Dispense Refill  . ALPRAZolam (XANAX) 0.5 MG tablet Take 1 tablet (0.5 mg total) by mouth daily as needed for anxiety. 10 tablet 0  . AMBULATORY NON FORMULARY  MEDICATION Medication Name: Shingles IM x 1 1 vial 0  . amLODipine (NORVASC) 10 MG tablet Take 1 tablet (10 mg total) by mouth daily. 180 tablet 3  . aspirin 81 MG EC tablet Take 81 mg by mouth daily.     Marland Kitchen atorvastatin (LIPITOR) 20 MG tablet Take one tablet by mouth once daily 90 tablet 4  . budesonide-formoterol (SYMBICORT) 160-4.5 MCG/ACT inhaler Inhale 1 puff into the lungs 2 (two) times daily. 1 Inhaler 3  . CALCIUM-VITAMIN D PO Take 1 tablet by mouth daily.    . hydrochlorothiazide (MICROZIDE) 12.5 MG capsule Take 1 capsule (12.5 mg total) by mouth daily. 30 capsule 6  . ibuprofen (ADVIL,MOTRIN) 200 MG  tablet Take 400 mg by mouth every 6 (six) hours as needed for headache or mild pain.     . Iron-Vitamins (GERITOL PO) Take 15 mLs by mouth daily.     . metoprolol (LOPRESSOR) 100 MG tablet Take 1 tablet (100 mg total) by mouth 2 (two) times daily. 180 tablet 0  . Multiple Vitamin (MULTIVITAMIN) tablet Take 1 tablet by mouth daily.    . ondansetron (ZOFRAN-ODT) 4 MG disintegrating tablet Take 1 tablet (4 mg total) by mouth every 8 (eight) hours as needed for nausea or vomiting. 20 tablet 0  . terazosin (HYTRIN) 2 MG capsule Take 1 capsule (2 mg total) by mouth at bedtime. 90 capsule 1  . warfarin (COUMADIN) 5 MG tablet Take as directed by [facility] 60 tablet 2  . albuterol (PROAIR HFA) 108 (90 BASE) MCG/ACT inhaler Inhale 2 puffs into the lungs every 6 (six) hours as needed for wheezing. 1 Inhaler 3   No current facility-administered medications for this visit.     Allergies (verified) Lisinopril; Carvedilol; Codeine sulfate; Ezetimibe-simvastatin; Propranolol hcl; and Ramipril   PAST HISTORY  Family History Family History  Problem Relation Age of Onset  . Heart attack Father 55  . Colon cancer Mother 74  . Hypertension Mother     Social History History  Substance Use Topics  . Smoking status: Former Smoker    Quit date: 04/26/1991  . Smokeless tobacco: Never Used  . Alcohol Use: No    Are there smokers in your home (other than you)?  No  Risk Factors Current exercise habits: exercises 4-5 days per week  Dietary issues discussed: none  Cardiac risk factors: advanced age (older than 52 for men, 22 for women) and dyslipidemia.  Depression Screen (Note: if answer to either of the following is "Yes", a more complete depression screening is indicated)   Q1: Over the past two weeks, have you felt down, depressed or hopeless? No  Q2: Over the past two weeks, have you felt little interest or pleasure in doing things? No  Have you lost interest or pleasure in daily life?  No  Do you often feel hopeless? No  Do you cry easily over simple problems? No  Activities of Daily Living In your present state of health, do you have any difficulty performing the following activities?:  Driving? No Managing money?  No Feeding yourself? No Getting from bed to chair? No Climbing a flight of stairs? No Preparing food and eating?: No Bathing or showering? No Getting dressed: No Getting to the toilet? No Using the toilet:No Moving around from place to place: No In the past year have you fallen or had a near fall?:No   Are you sexually active?  No  Do you have more than one partner?  No  Hearing Difficulties: No Do you often ask people to speak up or repeat themselves? No Do you experience ringing or noises in your ears? No Do you have difficulty understanding soft or whispered voices? No   Do you feel that you have a problem with memory? No  Do you often misplace items? No  Do you feel safe at home?  Yes  Cognitive Testing  Alert? Yes  Normal Appearance?Yes  Oriented to person? Yes  Place? Yes   Time? Yes  Recall of three objects?  Yes  Can perform simple calculations? Yes  Displays appropriate judgment?Yes  Can read the correct time from a watch face?Yes   Advanced Directives have been discussed with the patient? Yes   List the Names of Other Physician/Practitioners you currently use: 1.    Indicate any recent Medical Services you may have received from other than [facility] providers in the past year (date may be approximate).  Immunization History  Administered Date(s) Administered  . Influenza Split 02/10/2011  . Influenza,inj,Quad PF,36+ Mos 01/13/2013, 01/26/2014  . Pneumococcal Conjugate-13 09/23/2013  . Pneumococcal Polysaccharide-23 03/13/2011  . Tdap 03/07/1999    Screening Tests Health Maintenance  Topic Date Due  . Hepatitis C Screening  1950-05-04  . ZOSTAVAX  05/21/2008  . INFLUENZA VACCINE  10/05/2014  . PNA vac Low Risk Adult (2  of 2 - PPSV23) 03/12/2016  . TETANUS/TDAP  03/07/2019  . COLONOSCOPY  02/23/2021    All answers were reviewed with the patient and necessary referrals were made:  Arcelia Pals, MD   05/26/2014   History reviewed: allergies, current medications, past family history, past medical history, past social history, past surgical history and problem list  Review of Systems A comprehensive review of systems was negative.    Objective:     Vision by Snellen chart: right eye:20/40, left eye:20/40 Blood pressure 148/92, pulse 67, height 6\' 4"  (1.93 m), weight 203 lb (92.08 kg), SpO2 99 %. Body mass index is 24.72 kg/(m^2).  BP 148/92 mmHg  Pulse 67  Ht 6\' 4"  (1.93 m)  Wt 203 lb (92.08 kg)  BMI 24.72 kg/m2  SpO2 99%  General Appearance:    Alert, cooperative, no distress, appears stated age  Head:    Normocephalic, without obvious abnormality, atraumatic  Eyes:    PERRL, conjunctiva/corneas clear, EOM's intact, fundi    benign, both eyes       Ears:    Normal TM's and external ear canals, both ears  Nose:   Nares normal, septum midline, mucosa normal, no drainage    or sinus tenderness  Throat:   Lips, mucosa, and tongue normal; teeth and gums normal  Neck:   Supple, symmetrical, trachea midline, no adenopathy;       thyroid:   Small firm round cyst or LN in the right side of neck just under chin.    Back:     Symmetric, no curvature, ROM normal, no CVA tenderness  Lungs:     Clear to auscultation bilaterally, respirations unlabored  Chest wall:    No tenderness or deformity  Heart:    Regular rate and rhythm, S1 and S2 normal, no murmur, rub   or gallop  Abdomen:     Soft, non-tender, bowel sounds active all four quadrants,    no masses, no organomegaly  Genitalia:    Not performed   Rectal:    Not performed   Extremities:   Extremities normal, atraumatic, no cyanosis or edema. He does have a large  bunion on the left foot.   Pulses:   2+ and symmetric all extremities  Skin:    Skin color, texture, turgor normal, no rashes or lesions  Lymph nodes:   Cervical, supraclavicular, and axillary nodes normal  Neurologic:   CNII-XII intact. Normal strength, sensation and reflexes      throughout       Assessment:     Medicare annual Wellness Exam       Plan:     During the course of the visit the patient was educated and counseled about appropriate screening and preventive services including:    due for labs  Swollen LN - consider [place] for further evaluation to confirm if LN or cyst Bunion- gave reasurance. He is not really having pain. Would be happy to refer in the future if he feels like he started to have pain or problems with this foot. Pneumonia vaccines are up-to-date.   Diet review for nutrition referral? Yes ____  Not Indicated _X_   Patient Instructions (the written plan) was given to the patient.  Medicare Attestation I have personally reviewed: The patient's medical and social history Their use of alcohol, tobacco or illicit drugs Their current medications and supplements The patient's functional ability including ADLs,fall risks, home safety risks, cognitive, and hearing and visual impairment Diet and physical activities Evidence for depression or mood disorders  The patient's weight, height, BMI, and visual acuity have been recorded in the chart.  I have made referrals, counseling, and provided education to the patient based on review of the above and I have provided the patient with a written personalized care plan for preventive services.     Leyton Brownlee, MD   05/26/2014

## 2014-05-26 NOTE — Telephone Encounter (Signed)
Call pt: I would like to get an US of the swollen LN in the neck.  Let me know if he is ok with this.

## 2014-05-26 NOTE — Telephone Encounter (Signed)
Pt informed and he is ok with doing this.Corey Mathis [place]

## 2014-05-26 NOTE — Patient Instructions (Addendum)
Keep up a regular exercise program and make sure you are eating a healthy diet Try to eat 4 servings of dairy a day, or if you are lactose intolerant take a calcium with vitamin D daily.  Your vaccines are up to date.  Increase metoprolol to 100mg  twice a day.  Have them check you BP at the coumadin clinic and call me in a couple of weeks with your pressures

## 2014-05-27 ENCOUNTER — Ambulatory Visit (INDEPENDENT_AMBULATORY_CARE_PROVIDER_SITE_OTHER): Payer: Medicare Other | Admitting: *Deleted

## 2014-05-27 ENCOUNTER — Other Ambulatory Visit: Payer: Medicare Other

## 2014-05-27 DIAGNOSIS — Z9889 Other specified postprocedural states: Secondary | ICD-10-CM

## 2014-05-27 DIAGNOSIS — I059 Rheumatic mitral valve disease, unspecified: Secondary | ICD-10-CM

## 2014-05-27 DIAGNOSIS — Z5181 Encounter for therapeutic drug level monitoring: Secondary | ICD-10-CM | POA: Diagnosis not present

## 2014-05-27 LAB — POCT INR: INR: 2.7

## 2014-05-28 ENCOUNTER — Other Ambulatory Visit (INDEPENDENT_AMBULATORY_CARE_PROVIDER_SITE_OTHER): Payer: Medicare Other | Admitting: *Deleted

## 2014-05-28 DIAGNOSIS — R972 Elevated prostate specific antigen [PSA]: Secondary | ICD-10-CM | POA: Diagnosis not present

## 2014-05-28 DIAGNOSIS — I10 Essential (primary) hypertension: Secondary | ICD-10-CM

## 2014-05-28 DIAGNOSIS — I348 Other nonrheumatic mitral valve disorders: Secondary | ICD-10-CM | POA: Diagnosis not present

## 2014-05-28 DIAGNOSIS — I059 Rheumatic mitral valve disease, unspecified: Secondary | ICD-10-CM

## 2014-05-28 DIAGNOSIS — E785 Hyperlipidemia, unspecified: Secondary | ICD-10-CM

## 2014-05-28 LAB — LIPID PANEL
CHOL/HDL RATIO: 4
Cholesterol: 134 mg/dL (ref 0–200)
HDL: 30.4 mg/dL — AB (ref >39.00)
LDL Cholesterol: 76 mg/dL (ref 0–99)
NonHDL: 103.6
Triglycerides: 139 mg/dL (ref 0.0–149.0)
VLDL: 27.8 mg/dL (ref 0.0–40.0)

## 2014-05-28 LAB — COMPREHENSIVE METABOLIC PANEL
ALT: 27 U/L (ref 0–53)
AST: 37 U/L (ref 0–37)
Albumin: 3.9 g/dL (ref 3.5–5.2)
Alkaline Phosphatase: 89 U/L (ref 39–117)
BILIRUBIN TOTAL: 0.6 mg/dL (ref 0.2–1.2)
BUN: 9 mg/dL (ref 6–23)
CHLORIDE: 100 meq/L (ref 96–112)
CO2: 29 mEq/L (ref 19–32)
CREATININE: 1 mg/dL (ref 0.40–1.50)
Calcium: 9.2 mg/dL (ref 8.4–10.5)
GFR: 96.14 mL/min (ref >60.00)
Glucose, Bld: 109 mg/dL — ABNORMAL HIGH (ref 70–99)
Potassium: 3.7 mEq/L (ref 3.5–5.1)
Sodium: 137 mEq/L (ref 135–145)
Total Protein: 7.4 g/dL (ref 6.0–8.3)

## 2014-05-28 LAB — PSA: PSA: 1.57 ng/mL (ref 0.10–4.00)

## 2014-05-29 LAB — HEPATITIS C ANTIBODY: HCV Ab: NEGATIVE

## 2014-06-08 ENCOUNTER — Ambulatory Visit (INDEPENDENT_AMBULATORY_CARE_PROVIDER_SITE_OTHER): Payer: Medicare Other

## 2014-06-08 DIAGNOSIS — R221 Localized swelling, mass and lump, neck: Secondary | ICD-10-CM

## 2014-06-24 ENCOUNTER — Ambulatory Visit (INDEPENDENT_AMBULATORY_CARE_PROVIDER_SITE_OTHER): Payer: Medicare Other | Admitting: *Deleted

## 2014-06-24 DIAGNOSIS — Z5181 Encounter for therapeutic drug level monitoring: Secondary | ICD-10-CM | POA: Diagnosis not present

## 2014-06-24 DIAGNOSIS — Z9889 Other specified postprocedural states: Secondary | ICD-10-CM

## 2014-06-24 DIAGNOSIS — I059 Rheumatic mitral valve disease, unspecified: Secondary | ICD-10-CM

## 2014-06-24 LAB — POCT INR: INR: 3.2

## 2014-07-13 DIAGNOSIS — D239 Other benign neoplasm of skin, unspecified: Secondary | ICD-10-CM | POA: Diagnosis not present

## 2014-07-13 DIAGNOSIS — L723 Sebaceous cyst: Secondary | ICD-10-CM | POA: Diagnosis not present

## 2014-07-22 ENCOUNTER — Ambulatory Visit (INDEPENDENT_AMBULATORY_CARE_PROVIDER_SITE_OTHER): Payer: Medicare Other | Admitting: *Deleted

## 2014-07-22 DIAGNOSIS — Z9889 Other specified postprocedural states: Secondary | ICD-10-CM

## 2014-07-22 DIAGNOSIS — Z5181 Encounter for therapeutic drug level monitoring: Secondary | ICD-10-CM

## 2014-07-22 DIAGNOSIS — I059 Rheumatic mitral valve disease, unspecified: Secondary | ICD-10-CM | POA: Diagnosis not present

## 2014-07-22 LAB — POCT INR: INR: 2.9

## 2014-07-29 ENCOUNTER — Other Ambulatory Visit: Payer: Self-pay | Admitting: Pharmacist Clinician (PhC)/ Clinical Pharmacy Specialist

## 2014-07-29 MED ORDER — WARFARIN SODIUM 5 MG PO TABS: ORAL_TABLET | ORAL | Status: DC

## 2014-08-07 ENCOUNTER — Other Ambulatory Visit: Payer: Self-pay | Admitting: *Deleted

## 2014-08-07 MED ORDER — WARFARIN SODIUM 5 MG PO TABS: ORAL_TABLET | ORAL | Status: DC

## 2014-08-11 ENCOUNTER — Ambulatory Visit (INDEPENDENT_AMBULATORY_CARE_PROVIDER_SITE_OTHER): Payer: Medicare Other | Admitting: Family Medicine

## 2014-08-11 ENCOUNTER — Encounter: Payer: Self-pay | Admitting: Family Medicine

## 2014-08-11 VITALS — BP 126/74 | HR 61 | Ht 76.0 in | Wt 197.0 lb

## 2014-08-11 DIAGNOSIS — H9202 Otalgia, left ear: Secondary | ICD-10-CM

## 2014-08-11 DIAGNOSIS — M7989 Other specified soft tissue disorders: Secondary | ICD-10-CM | POA: Diagnosis not present

## 2014-08-11 DIAGNOSIS — T50905A Adverse effect of unspecified drugs, medicaments and biological substances, initial encounter: Secondary | ICD-10-CM

## 2014-08-11 MED ORDER — AMLODIPINE BESYLATE 2.5 MG PO TABS: 2.5000 mg | ORAL_TABLET | Freq: Every day | ORAL | Status: DC

## 2014-08-11 NOTE — Progress Notes (Signed)
   Subjective:    Patient ID: Corey Mathis, male    DOB: 1950-09-04, 64 y.o.   MRN: [id number]  HPI Ankles have been itching and swelling x 1 month. He was recently started on amlodipine.  Says stopped the amlodipine and his sxs have improved.  No CP or SOB  He has had a buzzing feeling in his left ear like a bug is in it and he would like it checked. Says sometime it gets dry and itchy.    Review of Systems     Objective:   Physical Exam  Constitutional: He is oriented to person, place, and time. He appears well-developed and well-nourished.  HENT:  Head: Normocephalic and atraumatic.  Cardiovascular: Normal rate, regular rhythm and normal heart sounds.   Pulmonary/Chest: Effort normal and breath sounds normal.  Musculoskeletal: He exhibits no edema.  Neurological: He is alert and oriented to person, place, and time.  Skin: Skin is warm and dry.  Psychiatric: He has a normal mood and affect. His behavior is normal.          Assessment & Plan:  Medication S.E./LE swelling -off  medications resolved issue.  I suspect that the cardiologist would really like to keep him on a calcium channel blocker. Thus we will try a low dose of amlodipine at 2.5 mg. Explain to him that most the time the side effects are dose dependent on this medication. Typically low doses don't tend to trigger the lower extreme a swelling. If it happens again then we will discontinue this medication at his intolerance list. And if he starts 6 parents swelling well off of the medication and please let me know and we will do further blood work.  Ear has a small contusion.  No infection. Avoid removing excess was as it can cause dryness. Ok to use sweet oil

## 2014-08-11 NOTE — Patient Instructions (Signed)
The alternatives to Symbicort are Advair, Dulera, Breo. One of these may be better covered on her insurance. I will encourage her to call. We can certainly switch it

## 2014-08-13 ENCOUNTER — Other Ambulatory Visit: Payer: Self-pay | Admitting: Pharmacist Clinician (PhC)/ Clinical Pharmacy Specialist

## 2014-08-13 MED ORDER — WARFARIN SODIUM 5 MG PO TABS: ORAL_TABLET | ORAL | Status: DC

## 2014-09-02 ENCOUNTER — Ambulatory Visit (INDEPENDENT_AMBULATORY_CARE_PROVIDER_SITE_OTHER): Payer: Medicare Other | Admitting: *Deleted

## 2014-09-02 DIAGNOSIS — Z5181 Encounter for therapeutic drug level monitoring: Secondary | ICD-10-CM

## 2014-09-02 DIAGNOSIS — I059 Rheumatic mitral valve disease, unspecified: Secondary | ICD-10-CM

## 2014-09-02 DIAGNOSIS — Z9889 Other specified postprocedural states: Secondary | ICD-10-CM

## 2014-09-02 LAB — PROTIME-INR
INR: 6.4 ratio (ref 0.8–1.0)
Prothrombin Time: 67.9 s (ref 9.6–13.1)

## 2014-09-02 LAB — POCT INR: INR: 6.2

## 2014-09-10 ENCOUNTER — Ambulatory Visit (INDEPENDENT_AMBULATORY_CARE_PROVIDER_SITE_OTHER): Payer: Medicare Other | Admitting: *Deleted

## 2014-09-10 DIAGNOSIS — Z5181 Encounter for therapeutic drug level monitoring: Secondary | ICD-10-CM

## 2014-09-10 DIAGNOSIS — I059 Rheumatic mitral valve disease, unspecified: Secondary | ICD-10-CM | POA: Diagnosis not present

## 2014-09-10 DIAGNOSIS — Z9889 Other specified postprocedural states: Secondary | ICD-10-CM

## 2014-09-10 LAB — POCT INR: INR: 1.5

## 2014-09-17 ENCOUNTER — Ambulatory Visit (INDEPENDENT_AMBULATORY_CARE_PROVIDER_SITE_OTHER): Payer: Medicare Other | Admitting: *Deleted

## 2014-09-17 DIAGNOSIS — I059 Rheumatic mitral valve disease, unspecified: Secondary | ICD-10-CM | POA: Diagnosis not present

## 2014-09-17 DIAGNOSIS — Z9889 Other specified postprocedural states: Secondary | ICD-10-CM

## 2014-09-17 DIAGNOSIS — Z5181 Encounter for therapeutic drug level monitoring: Secondary | ICD-10-CM | POA: Diagnosis not present

## 2014-09-17 LAB — POCT INR: INR: 4.1

## 2014-09-28 ENCOUNTER — Ambulatory Visit (INDEPENDENT_AMBULATORY_CARE_PROVIDER_SITE_OTHER): Payer: Medicare Other | Admitting: *Deleted

## 2014-09-28 DIAGNOSIS — Z9889 Other specified postprocedural states: Secondary | ICD-10-CM | POA: Diagnosis not present

## 2014-09-28 DIAGNOSIS — Z5181 Encounter for therapeutic drug level monitoring: Secondary | ICD-10-CM

## 2014-09-28 DIAGNOSIS — I059 Rheumatic mitral valve disease, unspecified: Secondary | ICD-10-CM | POA: Diagnosis not present

## 2014-09-28 LAB — POCT INR: INR: 3.8

## 2014-10-12 ENCOUNTER — Ambulatory Visit (INDEPENDENT_AMBULATORY_CARE_PROVIDER_SITE_OTHER): Payer: Medicare Other | Admitting: Family Medicine

## 2014-10-12 ENCOUNTER — Encounter: Payer: Self-pay | Admitting: Family Medicine

## 2014-10-12 ENCOUNTER — Other Ambulatory Visit: Payer: Self-pay | Admitting: Family Medicine

## 2014-10-12 VITALS — BP 139/85 | HR 66 | Ht 76.0 in | Wt 197.0 lb

## 2014-10-12 DIAGNOSIS — I10 Essential (primary) hypertension: Secondary | ICD-10-CM

## 2014-10-12 MED ORDER — HYDROCHLOROTHIAZIDE 25 MG PO TABS: 25.0000 mg | ORAL_TABLET | Freq: Every day | ORAL | Status: DC

## 2014-10-12 NOTE — Progress Notes (Signed)
   Subjective:    Patient ID: Corey Mathis, male    DOB: April 12, 1950, 64 y.o.   MRN: [id number]  HPI  Follow-up blood pressure. Keep loosely came in for lower extreme he swelling. We significantly dropped his amlodipine down to 2.5 mg and he has done much better since then. Swelling has been well controlled.  Review of Systems     Objective:   Physical Exam  Constitutional: He is oriented to person, place, and time. He appears well-developed and well-nourished.  HENT:  Head: Normocephalic and atraumatic.  Cardiovascular: Normal rate, regular rhythm and normal heart sounds.   Pulmonary/Chest: Effort normal and breath sounds normal.  Neurological: He is alert and oriented to person, place, and time.  Skin: Skin is warm and dry.  Psychiatric: He has a normal mood and affect. His behavior is normal.          Assessment & Plan:  Hypertension - discussed options. Will try 25mg  on the HCTZ.  F/U in 3 months. Will check BMP at f/u visit.

## 2014-11-09 ENCOUNTER — Other Ambulatory Visit: Payer: Self-pay | Admitting: Family Medicine

## 2014-11-23 ENCOUNTER — Ambulatory Visit: Payer: Medicare Other | Admitting: Family Medicine

## 2014-12-08 ENCOUNTER — Other Ambulatory Visit: Payer: Self-pay | Admitting: *Deleted

## 2014-12-08 MED ORDER — ALPRAZOLAM 0.5 MG PO TABS: 0.5000 mg | ORAL_TABLET | Freq: Every day | ORAL | Status: DC | PRN

## 2014-12-08 NOTE — Telephone Encounter (Signed)
Pt came by and requested a small supply of xanax for travel. He asked Dr. Orpah Melter to refill this for him and was told that he should have his pcp do this for him. rx filled and sent to his pharmacy.Corey Mathis

## 2014-12-27 ENCOUNTER — Other Ambulatory Visit: Payer: Self-pay | Admitting: Family Medicine

## 2014-12-31 ENCOUNTER — Ambulatory Visit (INDEPENDENT_AMBULATORY_CARE_PROVIDER_SITE_OTHER): Payer: Medicare Other | Admitting: Pharmacist

## 2014-12-31 DIAGNOSIS — I059 Rheumatic mitral valve disease, unspecified: Secondary | ICD-10-CM | POA: Diagnosis not present

## 2014-12-31 DIAGNOSIS — Z7901 Long term (current) use of anticoagulants: Secondary | ICD-10-CM | POA: Diagnosis not present

## 2014-12-31 DIAGNOSIS — Z5181 Encounter for therapeutic drug level monitoring: Secondary | ICD-10-CM

## 2014-12-31 DIAGNOSIS — Z9889 Other specified postprocedural states: Secondary | ICD-10-CM

## 2014-12-31 LAB — POCT INR: INR: 1.4

## 2015-01-12 ENCOUNTER — Ambulatory Visit (INDEPENDENT_AMBULATORY_CARE_PROVIDER_SITE_OTHER): Payer: Medicare Other | Admitting: Family Medicine

## 2015-01-12 ENCOUNTER — Encounter: Payer: Self-pay | Admitting: Family Medicine

## 2015-01-12 VITALS — BP 138/82

## 2015-01-12 DIAGNOSIS — Z23 Encounter for immunization: Secondary | ICD-10-CM

## 2015-01-12 DIAGNOSIS — I10 Essential (primary) hypertension: Secondary | ICD-10-CM

## 2015-01-12 MED ORDER — METOPROLOL SUCCINATE ER 100 MG PO TB24: 100.0000 mg | ORAL_TABLET | Freq: Every day | ORAL | Status: DC

## 2015-01-12 NOTE — Progress Notes (Signed)
   Subjective:    Patient ID: Corey Mathis, male    DOB: [birth year], 64 y.o.   MRN: [id number]  HPI Hypertension- Pt denies chest pain, SOB, dizziness, or heart palpitations.  Taking meds as directed w/o problems.  Denies medication side effects.   I saw him in August approximately 3 months ago we added hydrochlorothiazide 25 mg to his regimen. Asked him to follow-up to recheck his pressures. He is on amlodipine and the HCTZ and metoprolol.  Says has only been taking metoprolol QD instead of BID bc feels like it makes him tired and "makes his kidneys hurt".     Review of Systems     Objective:   Physical Exam  Constitutional: He is oriented to person, place, and time. He appears well-developed and well-nourished.  HENT:  Head: Normocephalic and atraumatic.  Cardiovascular: Normal rate, regular rhythm and normal heart sounds.   Pulmonary/Chest: Effort normal and breath sounds normal.  Neurological: He is alert and oriented to person, place, and time.  Skin: Skin is warm and dry.  Psychiatric: He has a normal mood and affect. His behavior is normal.          Assessment & Plan:   hypertension-repeat blood pressure much better and well controlled. Looks fantastic. Continue current regimen. Due for BMP. Lab slip provided he plans to go today. He has follow-up with his cardiologist soon. Follow-up in 6 months.  Flu vaccine given today.

## 2015-01-13 ENCOUNTER — Ambulatory Visit (INDEPENDENT_AMBULATORY_CARE_PROVIDER_SITE_OTHER): Payer: Medicare Other | Admitting: *Deleted

## 2015-01-13 DIAGNOSIS — I059 Rheumatic mitral valve disease, unspecified: Secondary | ICD-10-CM | POA: Diagnosis not present

## 2015-01-13 DIAGNOSIS — Z9889 Other specified postprocedural states: Secondary | ICD-10-CM

## 2015-01-13 DIAGNOSIS — Z7901 Long term (current) use of anticoagulants: Secondary | ICD-10-CM

## 2015-01-13 DIAGNOSIS — Z5181 Encounter for therapeutic drug level monitoring: Secondary | ICD-10-CM | POA: Diagnosis not present

## 2015-01-13 LAB — COMPLETE METABOLIC PANEL WITH GFR
ALT: 20 U/L (ref 9–46)
AST: 32 U/L (ref 10–35)
Albumin: 3.9 g/dL (ref 3.6–5.1)
Alkaline Phosphatase: 74 U/L (ref 40–115)
BILIRUBIN TOTAL: 0.6 mg/dL (ref 0.2–1.2)
BUN: 11 mg/dL (ref 7–25)
CALCIUM: 9 mg/dL (ref 8.6–10.3)
CHLORIDE: 100 mmol/L (ref 98–110)
CO2: 25 mmol/L (ref 20–31)
Creat: 0.95 mg/dL (ref 0.70–1.25)
GFR, EST NON AFRICAN AMERICAN: 83 mL/min (ref >=60)
Glucose, Bld: 93 mg/dL (ref 65–99)
Potassium: 4 mmol/L (ref 3.5–5.3)
Sodium: 136 mmol/L (ref 135–146)
Total Protein: 7.1 g/dL (ref 6.1–8.1)

## 2015-01-13 LAB — POCT INR: INR: 2.7

## 2015-01-13 NOTE — Progress Notes (Signed)
Quick Note:  All labs are normal. ______ 

## 2015-02-03 NOTE — Progress Notes (Signed)
HPI: FU mitral valve replacement with a St. Jude valve in 1998. A pre-operative cardiac catheterization revealed normal coronary arteries. The last echocardiogram in Oct 2014 showed normal LV function. The left atrium was mildly dilated. 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 Abdominal ultrasound November 2015 showed no aneurysm. Since I last saw him, the patient denies any dyspnea on exertion, orthopnea, PND, pedal edema, palpitations, syncope or chest pain.   Current Outpatient Prescriptions  Medication Sig Dispense Refill  . ALPRAZolam (XANAX) 0.5 MG tablet Take 1 tablet (0.5 mg total) by mouth daily as needed for anxiety. 10 tablet 0  . amLODipine (NORVASC) 2.5 MG tablet Take 1 tablet (2.5 mg total) by mouth daily. 30 tablet 5  . aspirin 81 MG EC tablet Take 81 mg by mouth daily.     Marland Kitchen atorvastatin (LIPITOR) 20 MG tablet Take one tablet by mouth once daily 90 tablet 4  . CALCIUM-VITAMIN D PO Take 1 tablet by mouth daily.    . hydrochlorothiazide (HYDRODIURIL) 25 MG tablet Take 1 tablet (25 mg total) by mouth daily. 30 tablet 11  . ibuprofen (ADVIL,MOTRIN) 200 MG tablet Take 400 mg by mouth every 6 (six) hours as needed for headache or mild pain.     . metoprolol succinate (TOPROL-XL) 100 MG 24 hr tablet Take 1 tablet (100 mg total) by mouth daily. Take with or immediately following a meal. 30 tablet 3  . Multiple Vitamin (MULTIVITAMIN) tablet Take 1 tablet by mouth daily.    . SYMBICORT 160-4.5 MCG/ACT inhaler INHALE ONE PUFF BY MOUTH TWICE DAILY 1 Inhaler 2  . terazosin (HYTRIN) 2 MG capsule TAKE ONE CAPSULE BY MOUTH AT BEDTIME 90 capsule 0  . warfarin (COUMADIN) 5 MG tablet Take 2 tablets by mouth daily or as directed by [facility] 60 tablet 3  . albuterol (PROAIR HFA) 108 (90 BASE) MCG/ACT inhaler Inhale 2 puffs into the lungs every 6 (six) hours as needed for wheezing. 1 Inhaler 3   No current facility-administered medications for this visit.     Past Medical History    Diagnosis Date  . Labyrinthitis     hx  . Asthma   . Anxiety   . Allergy history unknown   . Mitral valve replaced     hx  . Dyslipidemia   . HTN (hypertension)   . Allergy   . CHF (congestive heart failure) [facility])     Past Surgical History  Procedure Laterality Date  . Jaw surgery (other)    . Acne cyst removal      from hand and back  . Valve replacement  1998    St. Jude, mitral  . Nasal sinus surgery N/A 10/16/2012    Procedure: NASAL ENDOSCOPIC POLYPECTOMY/MAXILLARY ANTROSTOMY/ETHMOIDECTOMY;  Surgeon: Izora Gala, MD;  Location: [facility];  Service: ENT;  Laterality: N/A;  . Tooth extraction      Social History   Social History  . Marital Status: Married    Spouse Name: [name]   . Number of Children: 2  . Years of Education: N/A   Occupational History  . SALES [employer]    Social History Main Topics  . Smoking status: Former Smoker    Quit date: 04/26/1991  . Smokeless tobacco: Never Used  . Alcohol Use: No  . Drug Use: No  . Sexual Activity: Not on file   Other Topics Concern  . Not on file   Social History Narrative   Some exercise, bike and walking.  2 cups per day.     ROS: no fevers or chills, productive cough, hemoptysis, dysphasia, odynophagia, melena, hematochezia, dysuria, hematuria, rash, seizure activity, orthopnea, PND, pedal edema, claudication. Remaining systems are negative.  Physical Exam: Well-developed well-nourished in no acute distress.  Skin is warm and dry.  HEENT is normal.  Neck is supple.  Chest is clear to auscultation with normal expansion.  Cardiovascular exam is regular rate and rhythm. Crisp mechanical valve sound murmur apex. Abdominal exam nontender or distended. No masses palpated. Extremities show no edema. neuro grossly intact  ECG Sinus rhythm at a rate of 66. Normal axis. Left ventricular hypertrophy. Nonspecific ST changes.

## 2015-02-11 ENCOUNTER — Encounter: Payer: Self-pay | Admitting: Cardiology

## 2015-02-11 ENCOUNTER — Ambulatory Visit (INDEPENDENT_AMBULATORY_CARE_PROVIDER_SITE_OTHER): Payer: Medicare Other | Admitting: Cardiology

## 2015-02-11 VITALS — BP 148/96 | HR 66 | Ht 76.0 in | Wt 205.0 lb

## 2015-02-11 DIAGNOSIS — Z9889 Other specified postprocedural states: Secondary | ICD-10-CM | POA: Diagnosis not present

## 2015-02-11 DIAGNOSIS — I10 Essential (primary) hypertension: Secondary | ICD-10-CM

## 2015-02-11 LAB — CBC
HEMATOCRIT: 43.1 % (ref 39.0–52.0)
HEMOGLOBIN: 14.6 g/dL (ref 13.0–17.0)
MCH: 28.9 pg (ref 26.0–34.0)
MCHC: 33.9 g/dL (ref 30.0–36.0)
MCV: 85.2 fL (ref 78.0–100.0)
MPV: 10.4 fL (ref 8.6–12.4)
Platelets: 186 10*3/uL (ref 150–400)
RBC: 5.06 MIL/uL (ref 4.22–5.81)
RDW: 14.6 % (ref 11.5–15.5)
WBC: 4.1 10*3/uL (ref 4.0–10.5)

## 2015-02-11 MED ORDER — AMLODIPINE BESYLATE 5 MG PO TABS: 5.0000 mg | ORAL_TABLET | Freq: Every day | ORAL | Status: DC

## 2015-02-11 NOTE — Patient Instructions (Signed)
Medication Instructions:   INCREASE AMLODIPINE TO 5 MG ONCE DAILY= 2 OF THE 2.5 MG TABLETS ONCE DAILY  Labwork:  Your physician recommends that you HAVE LAB WORK TODA   Follow-Up:  Your physician wants you to follow-up in: [place] will receive a reminder letter in the mail two months in advance. If you don't receive a letter, please call our office to schedule the follow-up appointment.   If you need a refill on your cardiac medications before your next appointment, please call your pharmacy.

## 2015-02-11 NOTE — Assessment & Plan Note (Signed)
Continue Coumadin. Check hemoglobin. Discussed importance of SBE prophylaxis.

## 2015-02-11 NOTE — Assessment & Plan Note (Signed)
Continue statin. 

## 2015-02-11 NOTE — Assessment & Plan Note (Signed)
Blood pressure elevated. Increase amlodipineTo 5 mg daily and follow.

## 2015-02-24 ENCOUNTER — Telehealth: Payer: Self-pay | Admitting: Cardiology

## 2015-02-24 NOTE — Telephone Encounter (Signed)
Spoke to patient. CBC Result given . Verbalized understanding LABS ARE NORMAL.

## 2015-02-24 NOTE — Telephone Encounter (Signed)
Corey Mathis is calling to get his test/lab results . Please call

## 2015-03-29 ENCOUNTER — Ambulatory Visit (INDEPENDENT_AMBULATORY_CARE_PROVIDER_SITE_OTHER): Payer: Medicare Other

## 2015-03-29 DIAGNOSIS — Z9889 Other specified postprocedural states: Secondary | ICD-10-CM

## 2015-03-29 DIAGNOSIS — Z5181 Encounter for therapeutic drug level monitoring: Secondary | ICD-10-CM

## 2015-03-29 DIAGNOSIS — K117 Disturbances of salivary secretion: Secondary | ICD-10-CM | POA: Diagnosis not present

## 2015-03-29 DIAGNOSIS — K1370 Unspecified lesions of oral mucosa: Secondary | ICD-10-CM | POA: Diagnosis not present

## 2015-03-29 DIAGNOSIS — Z7901 Long term (current) use of anticoagulants: Secondary | ICD-10-CM

## 2015-03-29 DIAGNOSIS — I059 Rheumatic mitral valve disease, unspecified: Secondary | ICD-10-CM

## 2015-03-29 LAB — POCT INR: INR: 5.6

## 2015-03-30 ENCOUNTER — Telehealth: Payer: Self-pay | Admitting: *Deleted

## 2015-03-30 NOTE — Telephone Encounter (Signed)
Pt dropped off a form from dr carl little, he is needing clearance to have 4 teeth extracted. They are asking for the medical risk, low, moderate or high. They are also asking okay for pt to hold warfarin 5 days prior to the procedure and to restart 2 days post procedure. Will forward for dr [name] review

## 2015-03-30 NOTE — Telephone Encounter (Signed)
Needs sbe prophylaxis and lovenox bridge Kirk Ruths

## 2015-03-30 NOTE — Telephone Encounter (Signed)
Spoke with pt, Aware of dr Jacalyn Lefevre recommendations.  He has an appt with kristin on Friday 04/02/15. Will forward this information to kristin, the procedure has not been scheduled yet.

## 2015-04-02 ENCOUNTER — Ambulatory Visit (INDEPENDENT_AMBULATORY_CARE_PROVIDER_SITE_OTHER): Payer: Medicare Other

## 2015-04-02 DIAGNOSIS — Z9889 Other specified postprocedural states: Secondary | ICD-10-CM

## 2015-04-02 DIAGNOSIS — Z7901 Long term (current) use of anticoagulants: Secondary | ICD-10-CM | POA: Diagnosis not present

## 2015-04-02 DIAGNOSIS — I059 Rheumatic mitral valve disease, unspecified: Secondary | ICD-10-CM

## 2015-04-02 DIAGNOSIS — Z5181 Encounter for therapeutic drug level monitoring: Secondary | ICD-10-CM

## 2015-04-02 LAB — POCT INR: INR: 1.3

## 2015-04-02 NOTE — Telephone Encounter (Signed)
Pt being see at [facility].  Candance aware and will arrange bridge and speak with PharmD about abx prophylaxis (amox 2 gm 1 hr prior to procedure)

## 2015-04-02 NOTE — Telephone Encounter (Signed)
Pt is going OOT x 1 week daughter is having a baby.  He has not scheduled his dental extractions yet, but will call us once he has procedure scheduled.  Pt is aware he needs to hold Coumadin 5 days prior to procedure and be bridged with Lovenox.  Creat-0.95 on 01/12/15, wt 93.18kg, CrCl-100.81.  Pt will need Lovenox 100mg  BID when bridged. Will await call back from pt with date of procedure.

## 2015-04-19 ENCOUNTER — Other Ambulatory Visit: Payer: Self-pay | Admitting: Family Medicine

## 2015-05-19 ENCOUNTER — Ambulatory Visit (INDEPENDENT_AMBULATORY_CARE_PROVIDER_SITE_OTHER): Payer: Medicare Other | Admitting: Pharmacist

## 2015-05-19 DIAGNOSIS — Z9889 Other specified postprocedural states: Secondary | ICD-10-CM

## 2015-05-19 DIAGNOSIS — I059 Rheumatic mitral valve disease, unspecified: Secondary | ICD-10-CM | POA: Diagnosis not present

## 2015-05-19 DIAGNOSIS — Z5181 Encounter for therapeutic drug level monitoring: Secondary | ICD-10-CM | POA: Diagnosis not present

## 2015-05-19 DIAGNOSIS — Z7901 Long term (current) use of anticoagulants: Secondary | ICD-10-CM

## 2015-05-19 LAB — POCT INR: INR: 1.5

## 2015-05-26 ENCOUNTER — Other Ambulatory Visit: Payer: Self-pay | Admitting: Cardiology

## 2015-05-27 NOTE — Telephone Encounter (Signed)
Rx(s) sent to pharmacy electronically.  

## 2015-07-29 ENCOUNTER — Other Ambulatory Visit: Payer: Self-pay | Admitting: Family Medicine

## 2015-07-29 ENCOUNTER — Other Ambulatory Visit: Payer: Self-pay | Admitting: Cardiology

## 2015-07-30 ENCOUNTER — Ambulatory Visit (INDEPENDENT_AMBULATORY_CARE_PROVIDER_SITE_OTHER): Payer: Medicare Other | Admitting: *Deleted

## 2015-07-30 DIAGNOSIS — Z5181 Encounter for therapeutic drug level monitoring: Secondary | ICD-10-CM | POA: Diagnosis not present

## 2015-07-30 DIAGNOSIS — I059 Rheumatic mitral valve disease, unspecified: Secondary | ICD-10-CM | POA: Diagnosis not present

## 2015-07-30 DIAGNOSIS — Z9889 Other specified postprocedural states: Secondary | ICD-10-CM

## 2015-07-30 DIAGNOSIS — Z7901 Long term (current) use of anticoagulants: Secondary | ICD-10-CM | POA: Diagnosis not present

## 2015-07-30 LAB — POCT INR: INR: 1.5

## 2015-07-30 MED ORDER — WARFARIN SODIUM 5 MG PO TABS: ORAL_TABLET | ORAL | Status: DC

## 2015-08-05 ENCOUNTER — Ambulatory Visit (INDEPENDENT_AMBULATORY_CARE_PROVIDER_SITE_OTHER): Payer: Medicare Other

## 2015-08-05 DIAGNOSIS — Z7901 Long term (current) use of anticoagulants: Secondary | ICD-10-CM

## 2015-08-05 DIAGNOSIS — Z9889 Other specified postprocedural states: Secondary | ICD-10-CM | POA: Diagnosis not present

## 2015-08-05 DIAGNOSIS — Z5181 Encounter for therapeutic drug level monitoring: Secondary | ICD-10-CM

## 2015-08-05 DIAGNOSIS — I059 Rheumatic mitral valve disease, unspecified: Secondary | ICD-10-CM

## 2015-08-05 LAB — POCT INR: INR: 2.2

## 2015-08-10 ENCOUNTER — Encounter: Payer: Self-pay | Admitting: *Deleted

## 2015-08-10 ENCOUNTER — Emergency Department (INDEPENDENT_AMBULATORY_CARE_PROVIDER_SITE_OTHER)
Admission: EM | Admit: 2015-08-10 | Discharge: 2015-08-10 | Disposition: A | Discharge status: Home / Self Care | Payer: Medicare Other | Source: Home / Self Care | Attending: Family Medicine | Admitting: Family Medicine

## 2015-08-10 DIAGNOSIS — S61012A Laceration without foreign body of left thumb without damage to nail, initial encounter: Secondary | ICD-10-CM

## 2015-08-10 DIAGNOSIS — Z23 Encounter for immunization: Secondary | ICD-10-CM | POA: Diagnosis not present

## 2015-08-10 DIAGNOSIS — S61002A Unspecified open wound of left thumb without damage to nail, initial encounter: Secondary | ICD-10-CM

## 2015-08-10 MED ORDER — CEPHALEXIN 500 MG PO CAPS: 500.0000 mg | ORAL_CAPSULE | Freq: Three times a day (TID) | ORAL | Status: DC

## 2015-08-10 MED ORDER — TETANUS-DIPHTH-ACELL PERTUSSIS 5-2.5-18.5 LF-MCG/0.5 IM SUSP
0.5000 mL | Freq: Once | INTRAMUSCULAR | Status: AC
  Administered 2015-08-10: 0.5 mL via INTRAMUSCULAR

## 2015-08-10 NOTE — ED Provider Notes (Signed)
CSN: [id number]     Arrival date & time 08/10/15  1812 History   First MD Initiated Contact with Patient 08/10/15 1931     Chief Complaint  Patient presents with  . Extremity Laceration      HPI Comments: Patient lacerated his left thumb tip on a table saw blade one hour ago.  His last Tdap was in 2001. He has a history of mitral valve replacement.  Patient is a 65 y.o. male presenting with skin laceration. The history is provided by the patient.  Laceration Location:  Finger Finger laceration location:  L thumb Length (cm):  2.5 Depth:  Through underlying tissue Bleeding: controlled   Time since incident:  1 hour Injury mechanism: table saw blade. Pain details:    Quality:  Aching   Severity:  Mild   Timing:  Constant   Progression:  Improving Foreign body present:  No foreign bodies Worsened by:  Movement Ineffective treatments:  None tried Tetanus status:  Out of date   Past Medical History  Diagnosis Date  . Labyrinthitis     hx  . Asthma   . Anxiety   . Allergy history unknown   . Mitral valve replaced     hx  . Dyslipidemia   . HTN (hypertension)   . Allergy   . CHF (congestive heart failure) [facility])    Past Surgical History  Procedure Laterality Date  . Jaw surgery (other)    . Acne cyst removal      from hand and back  . Valve replacement  1998    St. Jude, mitral  . Nasal sinus surgery N/A 10/16/2012    Procedure: NASAL ENDOSCOPIC POLYPECTOMY/MAXILLARY ANTROSTOMY/ETHMOIDECTOMY;  Surgeon: Izora Gala, MD;  Location: [place];  Service: ENT;  Laterality: N/A;  . Tooth extraction     Family History  Problem Relation Age of Onset  . Heart attack Father 21  . Colon cancer Mother 46  . Hypertension Mother    Social History  Substance Use Topics  . Smoking status: Former Smoker    Quit date: 04/26/1991  . Smokeless tobacco: Never Used  . Alcohol Use: No    Review of Systems  All other systems reviewed and are negative.   Allergies  Lisinopril;  Carvedilol; Codeine sulfate; Ezetimibe-simvastatin; Propranolol hcl; and Ramipril  Home Medications   Prior to Admission medications   Medication Sig Start Date End Date Taking? Authorizing Provider  albuterol (PROAIR HFA) 108 (90 BASE) MCG/ACT inhaler Inhale 2 puffs into the lungs every 6 (six) hours as needed for wheezing. 10/18/12 01/13/14  [name], MD  ALPRAZolam Duanne Moron) 0.5 MG tablet Take 1 tablet (0.5 mg total) by mouth daily as needed for anxiety. 12/08/14   [name], MD  amLODipine (NORVASC) 5 MG tablet Take 1 tablet (5 mg total) by mouth daily. 02/11/15   Lelon Perla, MD  aspirin 81 MG EC tablet Take 81 mg by mouth daily.     Historical Provider, MD  atorvastatin (LIPITOR) 20 MG tablet TAKE ONE TABLET BY MOUTH ONCE DAILY 05/27/15   Lelon Perla, MD  CALCIUM-VITAMIN D PO Take 1 tablet by mouth daily.    Historical Provider, MD  hydrochlorothiazide (HYDRODIURIL) 25 MG tablet Take 1 tablet (25 mg total) by mouth daily. 10/12/14   [name], MD  ibuprofen (ADVIL,MOTRIN) 200 MG tablet Take 400 mg by mouth every 6 (six) hours as needed for headache or mild pain.     Historical Provider,  MD  metoprolol succinate (TOPROL-XL) 100 MG 24 hr tablet Take 1 tablet (100 mg total) by mouth daily. Take with or immediately following a meal. 01/12/15   [name], MD  Multiple Vitamin (MULTIVITAMIN) tablet Take 1 tablet by mouth daily.    Historical Provider, MD  SYMBICORT 160-4.5 MCG/ACT inhaler INHALE ONE PUFF BY MOUTH TWICE DAILY 11/12/14   [name], MD  terazosin (HYTRIN) 2 MG capsule TAKE ONE CAPSULE BY MOUTH ONCE DAILY AT BEDTIME 07/30/15   [name], MD  warfarin (COUMADIN) 5 MG tablet Take 2 tablets by mouth daily or as directed by [facility] 07/30/15   Lelon Perla, MD   Meds Ordered and Administered this Visit   Medications  Tdap (BOOSTRIX) injection 0.5 mL (0.5 mLs Intramuscular Given 08/10/15 1932)    BP 158/94 mmHg   Pulse 71  Temp(Src) 98.1 F (36.7 C) (Oral)  Resp 16  Ht 6\' 4"  (1.93 m)  Wt 200 lb (90.719 kg)  BMI 24.35 kg/m2  SpO2 97% No data found.   Physical Exam  Constitutional: He is oriented to person, place, and time. He appears well-developed and well-nourished. No distress.  HENT:  Head: Normocephalic.  Eyes: Pupils are equal, round, and reactive to light.  Musculoskeletal:       Left hand: He exhibits laceration. He exhibits normal range of motion, no tenderness, no bony tenderness, normal two-point discrimination, normal capillary refill, no deformity and no swelling.       Hands: The palmar aspect of left thumb tip has a 1cm long simple laceration that extends to tip of fingernail.  Proximal to this laceration is a 1.5cm long by 32mm wide superficial skin avulsion.  Thumb has full range of motion all joints.  Distal neurovascular function is intact.   Neurological: He is alert and oriented to person, place, and time.  Skin: Skin is warm and dry.  Nursing note and vitals reviewed.   ED Course  Procedures Laceration Repair Discussed benefits and risks of procedure and verbal consent obtained. Using sterile technique and digital anesthesia with 2% lidocaine without epinephrine, cleansed wound with Betadine followed by copious lavage with normal saline.  Wound carefully inspected for debris and foreign bodies; none found.  Distal laceration closed with #5, 4-0 interrupted nylon sutures.  Wound dressed with Xeroform gauze followed by light wrap of Kerlix gauze and CoBan wrap.   Wound precautions explained to patient.  Return for suture removal in 10 days.    MDM   1. Laceration of left thumb, initial encounter    With a history of mitral valve replacement, will begin empiric Keflex 500mg  Q8hr.  After tomorrow's visit, change dressing daily with Bacitracin ointment and non-stick bandage such as Telfa.  Keep wound clean and dry.  Return for any signs of infection (or follow-up with  family doctor):  Increasing redness, swelling, pain, heat, drainage, etc. Return tomorow for follow-up and dressing change.  Return in 10 days for suture removal.  May take Tylenol as needed for pain.    Kandra Nicolas, MD 08/11/15 [phone]

## 2015-08-10 NOTE — Discharge Instructions (Signed)
Change dressing daily and apply Bacitracin ointment to wound.  Keep wound clean and dry.  Return for any signs of infection (or follow-up with family doctor):  Increasing redness, swelling, pain, heat, drainage, etc. Return in 10 days for suture removal.  May take Tylenol as needed for pain.   Sutured Wound Care Sutures are stitches that can be used to close wounds. Taking care of your wound properly can help to prevent pain and infection. It can also help your wound to heal more quickly. HOW TO CARE FOR YOUR SUTURED WOUND Wound Care  Keep the wound clean and dry.  If you were given a bandage (dressing), you should change it at least once per day or as directed by your health care provider. You should also change it if it becomes wet or dirty.  Keep the wound completely dry for the first 24 hours or as directed by your health care provider. After that time, you may shower or bathe. However, make sure that the wound is not soaked in water until the sutures have been removed.  Clean the wound one time each day or as directed by your health care provider.  Wash the wound with soap and water.  Rinse the wound with water to remove all soap.  Pat the wound dry with a clean towel. Do not rub the wound.  Aftercleaning the wound, apply a thin layer of antibioticointment as directed by your health care provider. This will help to prevent infection and keep the dressing from sticking to the wound.  Have the sutures removed as directed by your health care provider. General Instructions  Take or apply medicines only as directed by your health care provider.  To help prevent scarring, make sure to cover your wound with sunscreen whenever you are outside after the sutures are removed and the wound is healed. Make sure to wear a sunscreen of at least 30 SPF.  If you were prescribed an antibiotic medicine or ointment, finish all of it even if you start to feel better.  Do not scratch or pick at  the wound.  Keep all follow-up visits as directed by your health care provider. This is important.  Check your wound every day for signs of infection. Watch for:   Redness, swelling, or pain.  Fluid, blood, or pus.  Raise (elevate) the injured area above the level of your heart while you are sitting or lying down, if possible.  Avoid stretching your wound.  Drink enough fluids to keep your urine clear or pale yellow. SEEK MEDICAL CARE IF:  You received a tetanus shot and you have swelling, severe pain, redness, or bleeding at the injection site.  You have a fever.  A wound that was closed breaks open.  You notice a bad smell coming from the wound.  You notice something coming out of the wound, such as wood or glass.  Your pain is not controlled with medicine.  You have increased redness, swelling, or pain at the site of your wound.  You have fluid, blood, or pus coming from your wound.  You notice a change in the color of your skin near your wound.  You need to change the dressing frequently due to fluid, blood, or pus draining from the wound.  You develop a new rash.  You develop numbness around the wound. SEEK IMMEDIATE MEDICAL CARE IF:  You develop severe swelling around the injury site.  Your pain suddenly increases and is severe.  You develop painful  lumps near the wound or on skin that is anywhere on your body.  You have a red streak going away from your wound.  The wound is on your hand or foot and you cannot properly move a finger or toe.  The wound is on your hand or foot and you notice that your fingers or toes look pale or bluish.   This information is not intended to replace advice given to you by your health care provider. Make sure you discuss any questions you have with your health care provider.   Document Released: 03/30/2004 Document Revised: 07/07/2014 Document Reviewed: 10/02/2012 Elsevier Interactive Patient Education [employer].

## 2015-08-10 NOTE — ED Notes (Signed)
Pt c/o LT thumb laceration x 1 hour ago. He reports cutting it with a saw while working at home. Last Tdap 2001.

## 2015-08-11 ENCOUNTER — Encounter: Payer: Self-pay | Admitting: *Deleted

## 2015-08-11 ENCOUNTER — Emergency Department (INDEPENDENT_AMBULATORY_CARE_PROVIDER_SITE_OTHER)
Admission: EM | Admit: 2015-08-11 | Discharge: 2015-08-11 | Disposition: A | Discharge status: Home / Self Care | Payer: Medicare Other | Source: Home / Self Care | Attending: Family Medicine | Admitting: Family Medicine

## 2015-08-11 DIAGNOSIS — Z5189 Encounter for other specified aftercare: Secondary | ICD-10-CM | POA: Diagnosis not present

## 2015-08-11 NOTE — ED Provider Notes (Signed)
CSN: [id number]     Arrival date & time 08/11/15  1228 History   First MD Initiated Contact with Patient 08/11/15 1240     Chief Complaint  Patient presents with  . Dressing Change   (Consider location/radiation/quality/duration/timing/severity/associated sxs/prior Treatment) HPI Comments: Pt seen yesterday at [facility] for laceration and skin avulsion on Left thumb from table saw. Advised to return today for wound check and bandage change.   Patient is a 65 y.o. male presenting with wound check. The history is provided by the patient.  Wound Check This is a new problem. The current episode started yesterday. The problem occurs constantly. The problem has been gradually improving. Associated symptoms comments: Denies fevers, chills, nausea or vomiting. Denies numbness of thumb.. The symptoms are aggravated by bending (bending Left thumb). The symptoms are relieved by rest. Treatments tried: antibiotics as prescribed yesterday. The treatment provided mild (5 sutures in place. no complications or concerns from pt today.) relief.    Past Medical History  Diagnosis Date  . Labyrinthitis     hx  . Asthma   . Anxiety   . Allergy history unknown   . Mitral valve replaced     hx  . Dyslipidemia   . HTN (hypertension)   . Allergy   . CHF (congestive heart failure) [facility])    Past Surgical History  Procedure Laterality Date  . Jaw surgery (other)    . Acne cyst removal      from hand and back  . Valve replacement  1998    St. Jude, mitral  . Nasal sinus surgery N/A 10/16/2012    Procedure: NASAL ENDOSCOPIC POLYPECTOMY/MAXILLARY ANTROSTOMY/ETHMOIDECTOMY;  Surgeon: Izora Gala, MD;  Location: [place];  Service: ENT;  Laterality: N/A;  . Tooth extraction     Family History  Problem Relation Age of Onset  . Heart attack Father 16  . Colon cancer Mother 55  . Hypertension Mother    Social History  Substance Use Topics  . Smoking status: Former Smoker    Quit date: 04/26/1991  . Smokeless  tobacco: Never Used  . Alcohol Use: No    Review of Systems  Constitutional: Negative for fever and chills.  Musculoskeletal: Negative for myalgias, joint swelling and arthralgias.  Skin: Positive for wound. Negative for color change.  Neurological: Negative for weakness and numbness.    Allergies  Lisinopril; Carvedilol; Codeine sulfate; Ezetimibe-simvastatin; Propranolol hcl; and Ramipril  Home Medications   Prior to Admission medications   Medication Sig Start Date End Date Taking? Authorizing Provider  albuterol (PROAIR HFA) 108 (90 BASE) MCG/ACT inhaler Inhale 2 puffs into the lungs every 6 (six) hours as needed for wheezing. 10/18/12 01/13/14  [name], MD  ALPRAZolam Duanne Moron) 0.5 MG tablet Take 1 tablet (0.5 mg total) by mouth daily as needed for anxiety. 12/08/14   [name], MD  amLODipine (NORVASC) 5 MG tablet Take 1 tablet (5 mg total) by mouth daily. 02/11/15   Lelon Perla, MD  aspirin 81 MG EC tablet Take 81 mg by mouth daily.     Historical Provider, MD  atorvastatin (LIPITOR) 20 MG tablet TAKE ONE TABLET BY MOUTH ONCE DAILY 05/27/15   Lelon Perla, MD  CALCIUM-VITAMIN D PO Take 1 tablet by mouth daily.    Historical Provider, MD  cephALEXin (KEFLEX) 500 MG capsule Take 1 capsule (500 mg total) by mouth 3 (three) times daily. 08/10/15   Kandra Nicolas, MD  hydrochlorothiazide (HYDRODIURIL) 25 MG tablet Take  1 tablet (25 mg total) by mouth daily. 10/12/14   [name], MD  ibuprofen (ADVIL,MOTRIN) 200 MG tablet Take 400 mg by mouth every 6 (six) hours as needed for headache or mild pain.     Historical Provider, MD  metoprolol succinate (TOPROL-XL) 100 MG 24 hr tablet Take 1 tablet (100 mg total) by mouth daily. Take with or immediately following a meal. 01/12/15   [name], MD  Multiple Vitamin (MULTIVITAMIN) tablet Take 1 tablet by mouth daily.    Historical Provider, MD  SYMBICORT 160-4.5 MCG/ACT inhaler INHALE ONE PUFF BY  MOUTH TWICE DAILY 11/12/14   [name], MD  terazosin (HYTRIN) 2 MG capsule TAKE ONE CAPSULE BY MOUTH ONCE DAILY AT BEDTIME 07/30/15   [name], MD  warfarin (COUMADIN) 5 MG tablet Take 2 tablets by mouth daily or as directed by Coumadin Clinic 07/30/15   Lelon Perla, MD   Meds Ordered and Administered this Visit  Medications - No data to display  BP 161/100 mmHg  Pulse 60  Temp(Src) 97.4 F (36.3 C) (Oral)  Resp 16  SpO2 96% No data found.   Physical Exam  Constitutional: He is oriented to person, place, and time. He appears well-developed and well-nourished.  HENT:  Head: Normocephalic and atraumatic.  Eyes: EOM are normal.  Neck: Normal range of motion.  Cardiovascular: Normal rate.   Pulmonary/Chest: Effort normal.  Musculoskeletal: Normal range of motion. He exhibits tenderness. He exhibits no edema.  Left thumb: mild tenderness near wound (see skin exam) full ROM, no edema.  Neurological: He is alert and oriented to person, place, and time.  Skin: Skin is warm and dry. No erythema.  Left thumb: 5 interrupted sutures in place, clean and dry. Skin avulsion also noted on same thumb. Dried red blood. No discharge or active bleeding.  Psychiatric: He has a normal mood and affect. His behavior is normal.  Nursing note and vitals reviewed.   ED Course  Procedures (including critical care time)  Labs Review Labs Reviewed - No data to display  Imaging Review No results found.    MDM   1. Visit for wound check    Wound appears to be healing well. Encouraged to f/u in 9 days at [facility] or with his PCP for suture removal. May f/u sooner if signs of infection. Encouraged to keep taking his antibiotics prescribed yesterday. Patient verbalized understanding and agreement with treatment plan.     Noland Fordyce, PA-C 08/11/15 1252

## 2015-08-11 NOTE — ED Notes (Signed)
Corey Mathis is here today for a recheck of his LT thumb laceration. Sutures placed 08/10/15.

## 2015-08-19 ENCOUNTER — Ambulatory Visit (INDEPENDENT_AMBULATORY_CARE_PROVIDER_SITE_OTHER): Payer: Medicare Other | Admitting: *Deleted

## 2015-08-19 DIAGNOSIS — I059 Rheumatic mitral valve disease, unspecified: Secondary | ICD-10-CM

## 2015-08-19 DIAGNOSIS — Z9889 Other specified postprocedural states: Secondary | ICD-10-CM

## 2015-08-19 DIAGNOSIS — Z7901 Long term (current) use of anticoagulants: Secondary | ICD-10-CM

## 2015-08-19 DIAGNOSIS — Z5181 Encounter for therapeutic drug level monitoring: Secondary | ICD-10-CM

## 2015-08-19 LAB — POCT INR: INR: 2.5

## 2015-08-27 ENCOUNTER — Other Ambulatory Visit: Payer: Self-pay | Admitting: Family Medicine

## 2015-09-02 ENCOUNTER — Emergency Department (INDEPENDENT_AMBULATORY_CARE_PROVIDER_SITE_OTHER)
Admission: EM | Admit: 2015-09-02 | Discharge: 2015-09-02 | Disposition: A | Discharge status: Home / Self Care | Payer: Medicare Other | Source: Home / Self Care

## 2015-09-02 DIAGNOSIS — Z4802 Encounter for removal of sutures: Secondary | ICD-10-CM | POA: Diagnosis not present

## 2015-09-02 NOTE — ED Provider Notes (Signed)
CSN: [id number]     Arrival date & time 09/02/15  1347 History   None    Chief Complaint  Patient presents with  . Suture / Staple Removal      HPI Comments: Patient presents for suture removal.  He has no complaints.  The history is provided by the patient.    Past Medical History  Diagnosis Date  . Labyrinthitis     hx  . Asthma   . Anxiety   . Allergy history unknown   . Mitral valve replaced     hx  . Dyslipidemia   . HTN (hypertension)   . Allergy   . CHF (congestive heart failure) [facility])    Past Surgical History  Procedure Laterality Date  . Jaw surgery (other)    . Acne cyst removal      from hand and back  . Valve replacement  1998    St. Jude, mitral  . Nasal sinus surgery N/A 10/16/2012    Procedure: NASAL ENDOSCOPIC POLYPECTOMY/MAXILLARY ANTROSTOMY/ETHMOIDECTOMY;  Surgeon: Izora Gala, MD;  Location: [place];  Service: ENT;  Laterality: N/A;  . Tooth extraction     Family History  Problem Relation Age of Onset  . Heart attack Father 4  . Colon cancer Mother 9  . Hypertension Mother    Social History  Substance Use Topics  . Smoking status: Former Smoker    Quit date: 04/26/1991  . Smokeless tobacco: Never Used  . Alcohol Use: No    Review of Systems No swelling, redness, drainage, or pain at laceration site. Allergies  Lisinopril; Carvedilol; Codeine sulfate; Ezetimibe-simvastatin; Propranolol hcl; and Ramipril  Home Medications   Prior to Admission medications   Medication Sig Start Date End Date Taking? Authorizing Provider  albuterol (PROAIR HFA) 108 (90 BASE) MCG/ACT inhaler Inhale 2 puffs into the lungs every 6 (six) hours as needed for wheezing. 10/18/12 01/13/14  [name], MD  ALPRAZolam Duanne Moron) 0.5 MG tablet Take 1 tablet (0.5 mg total) by mouth daily as needed for anxiety. 12/08/14   [name], MD  amLODipine (NORVASC) 5 MG tablet Take 1 tablet (5 mg total) by mouth daily. 02/11/15   Lelon Perla, MD  aspirin 81  MG EC tablet Take 81 mg by mouth daily.     Historical Provider, MD  atorvastatin (LIPITOR) 20 MG tablet TAKE ONE TABLET BY MOUTH ONCE DAILY 05/27/15   Lelon Perla, MD  CALCIUM-VITAMIN D PO Take 1 tablet by mouth daily.    Historical Provider, MD  cephALEXin (KEFLEX) 500 MG capsule Take 1 capsule (500 mg total) by mouth 3 (three) times daily. 08/10/15   Kandra Nicolas, MD  hydrochlorothiazide (HYDRODIURIL) 25 MG tablet Take 1 tablet (25 mg total) by mouth daily. 10/12/14   [name], MD  ibuprofen (ADVIL,MOTRIN) 200 MG tablet Take 400 mg by mouth every 6 (six) hours as needed for headache or mild pain.     Historical Provider, MD  metoprolol succinate (TOPROL-XL) 100 MG 24 hr tablet TAKE ONE TABLET BY MOUTH ONCE DAILY. TAKE WITH OR IMMEDIATELY FOLLOWING A MEAL. 08/30/15   [name], MD  Multiple Vitamin (MULTIVITAMIN) tablet Take 1 tablet by mouth daily.    Historical Provider, MD  SYMBICORT 160-4.5 MCG/ACT inhaler INHALE ONE PUFF BY MOUTH TWICE DAILY 11/12/14   [name], MD  terazosin (HYTRIN) 2 MG capsule TAKE ONE CAPSULE BY MOUTH ONCE DAILY AT BEDTIME 07/30/15   [name], MD  warfarin (COUMADIN) 5 MG tablet Take 2 tablets by mouth daily or as directed by Coumadin Clinic 07/30/15   Lelon Perla, MD   Meds Ordered and Administered this Visit  Medications - No data to display  Temp(Src) 98.1 F (36.7 C) (Oral) No data found.   Physical Exam Patient appears comfortable and in no distress. Left thumb tip:  Well healed laceration.  No erythema, swelling, drainage, or tenderness to palpation  ED Course  Procedures Sutures removed without difficulty  MDM   1. Visit for suture removal    Wound well healed; no evidence cellulitis. Recommend massaging scar with vitamin E oil or scar preparation such as Mederma.    Kandra Nicolas, MD 09/02/15 1431

## 2015-09-02 NOTE — Discharge Instructions (Signed)
Recommend massaging scar with vitamin E oil or scar preparation such as Mederma.     Suture Removal, Care After Refer to this sheet in the next few weeks. These instructions provide you with information on caring for yourself after your procedure. Your health care provider may also give you more specific instructions. Your treatment has been planned according to current medical practices, but problems sometimes occur. Call your health care provider if you have any problems or questions after your procedure. WHAT TO EXPECT AFTER THE PROCEDURE After your stitches (sutures) are removed, it is typical to have the following:  Some discomfort and swelling in the wound area.  Slight redness in the area. HOME CARE INSTRUCTIONS   If you have skin adhesive strips over the wound area, do not take the strips off. They will fall off on their own in a few days. If the strips remain in place after 14 days, you may remove them.  Change any bandages (dressings) at least once a day or as directed by your health care provider. If the bandage sticks, soak it off with warm, soapy water.  Apply cream or ointment only as directed by your health care provider. If using cream or ointment, wash the area with soap and water 2 times a day to remove all the cream or ointment. Rinse off the soap and pat the area dry with a clean towel.  Keep the wound area dry and clean. If the bandage becomes wet or dirty, or if it develops a bad smell, change it as soon as possible.  Continue to protect the wound from injury.  Use sunscreen when out in the sun. New scars become sunburned easily. SEEK MEDICAL CARE IF:  You have increasing redness, swelling, or pain in the wound.  You see pus coming from the wound.  You have a fever.  You notice a bad smell coming from the wound or dressing.  Your wound breaks open (edges not staying together).   This information is not intended to replace advice given to you by your health  care provider. Make sure you discuss any questions you have with your health care provider.   Document Released: 11/15/2000 Document Revised: 12/11/2012 Document Reviewed: 10/02/2012 Elsevier Interactive Patient Education [employer].

## 2015-09-02 NOTE — ED Notes (Signed)
Pt is here for suture removal from left thumb/ No s/s of infection. Reports sharp possible nerve pain on the side of his thumb at times.

## 2015-10-23 ENCOUNTER — Other Ambulatory Visit: Payer: Self-pay | Admitting: Family Medicine

## 2015-10-25 DIAGNOSIS — Z23 Encounter for immunization: Secondary | ICD-10-CM | POA: Diagnosis not present

## 2015-11-01 ENCOUNTER — Other Ambulatory Visit: Payer: Self-pay

## 2015-12-01 ENCOUNTER — Other Ambulatory Visit: Payer: Self-pay | Admitting: Family Medicine

## 2015-12-08 ENCOUNTER — Other Ambulatory Visit: Payer: Self-pay | Admitting: Cardiology

## 2015-12-10 ENCOUNTER — Other Ambulatory Visit: Payer: Self-pay | Admitting: Cardiology

## 2016-01-08 ENCOUNTER — Other Ambulatory Visit: Payer: Self-pay | Admitting: Family Medicine

## 2016-01-11 ENCOUNTER — Ambulatory Visit (INDEPENDENT_AMBULATORY_CARE_PROVIDER_SITE_OTHER): Payer: Medicare Other | Admitting: *Deleted

## 2016-01-11 DIAGNOSIS — Z5181 Encounter for therapeutic drug level monitoring: Secondary | ICD-10-CM | POA: Diagnosis not present

## 2016-01-11 DIAGNOSIS — I059 Rheumatic mitral valve disease, unspecified: Secondary | ICD-10-CM | POA: Diagnosis not present

## 2016-01-11 DIAGNOSIS — Z9889 Other specified postprocedural states: Secondary | ICD-10-CM

## 2016-01-11 LAB — POCT INR: INR: 3.3

## 2016-01-11 MED ORDER — WARFARIN SODIUM 5 MG PO TABS: ORAL_TABLET | ORAL | 0 refills | Status: DC

## 2016-01-16 ENCOUNTER — Encounter: Payer: Self-pay | Admitting: Emergency Medicine

## 2016-01-16 ENCOUNTER — Emergency Department (INDEPENDENT_AMBULATORY_CARE_PROVIDER_SITE_OTHER)
Admission: EM | Admit: 2016-01-16 | Discharge: 2016-01-16 | Disposition: A | Discharge status: Home / Self Care | Payer: Medicare Other | Source: Home / Self Care | Attending: Family Medicine | Admitting: Family Medicine

## 2016-01-16 DIAGNOSIS — B9789 Other viral agents as the cause of diseases classified elsewhere: Secondary | ICD-10-CM

## 2016-01-16 DIAGNOSIS — J069 Acute upper respiratory infection, unspecified: Secondary | ICD-10-CM

## 2016-01-16 DIAGNOSIS — J9801 Acute bronchospasm: Secondary | ICD-10-CM

## 2016-01-16 MED ORDER — PREDNISONE 20 MG PO TABS: ORAL_TABLET | ORAL | 0 refills | Status: DC

## 2016-01-16 MED ORDER — BENZONATATE 200 MG PO CAPS: 200.0000 mg | ORAL_CAPSULE | Freq: Every day | ORAL | 0 refills | Status: DC

## 2016-01-16 MED ORDER — METHYLPREDNISOLONE SODIUM SUCC 125 MG IJ SOLR
125.0000 mg | Freq: Once | INTRAMUSCULAR | Status: AC
  Administered 2016-01-16: 125 mg via INTRAMUSCULAR

## 2016-01-16 MED ORDER — DOXYCYCLINE HYCLATE 100 MG PO CAPS: 100.0000 mg | ORAL_CAPSULE | Freq: Two times a day (BID) | ORAL | 0 refills | Status: DC

## 2016-01-16 NOTE — ED Provider Notes (Signed)
Corey Mathis [facility]ARE    CSN: [id number] Arrival date & time: 01/16/16  1606     History   Chief Complaint Chief Complaint  Patient presents with  . Nasal Congestion  . Wheezing    HPI Corey Mathis is a 65 y.o. male.   Patient complains of four day history of typical cold-like symptoms developing over several days, including mild sore throat, sinus congestion, headache, fatigue, and cough.  He has a history of asthma well controlled with Symbicort, but has developed increased shortness of breath and wheezing during the past two days.  He does not use albuterol because it makes him "nervous." He has a past history of pneumonia about 8 or 9 years ago.   The history is provided by the patient.    Past Medical History:  Diagnosis Date  . Allergy   . Allergy history unknown   . Anxiety   . Asthma   . CHF (congestive heart failure) ([place])   . Dyslipidemia   . HTN (hypertension)   . Labyrinthitis    hx  . Mitral valve replaced    hx    Patient Active Problem List   Diagnosis Date Noted  . Bruit 01/08/2014  . IFG (impaired fasting glucose) 09/23/2013  . Lung nodule 03/31/2013  . BPH (benign prostatic hyperplasia) 09/27/2012  . Sebaceous cyst 04/26/2011  . Lipoma of back 04/26/2011  . Long term (current) use of anticoagulants 01/25/2011  . Mitral valve disorder 04/14/2010  . WEIGHT LOSS 02/11/2010  . Hyperlipidemia 06/03/2007  . Anxiety state 06/03/2007  . Essential hypertension 06/03/2007  . ASTHMA 06/03/2007  . ALLERGY 06/03/2007  . LABYRINTHITIS, HX OF 06/03/2007  . MITRAL VALVE REPLACEMENT, HX OF 06/03/2007    Past Surgical History:  Procedure Laterality Date  . ACNE CYST REMOVAL     from hand and back  . Jaw surgery (other)    . NASAL SINUS SURGERY N/A 10/16/2012   Procedure: NASAL ENDOSCOPIC POLYPECTOMY/MAXILLARY ANTROSTOMY/ETHMOIDECTOMY;  Surgeon: Izora Gala, MD;  Location: [place];  Service: ENT;  Laterality: N/A;  . TOOTH EXTRACTION    . VALVE  REPLACEMENT  1998   St. Jude, mitral       Home Medications    Prior to Admission medications   Medication Sig Start Date End Date Taking? Authorizing Provider  albuterol (PROAIR HFA) 108 (90 BASE) MCG/ACT inhaler Inhale 2 puffs into the lungs every 6 (six) hours as needed for wheezing. 10/18/12 01/13/14  [name], MD  ALPRAZolam Duanne Moron) 0.5 MG tablet Take 1 tablet (0.5 mg total) by mouth daily as needed for anxiety. 12/08/14   [name], MD  amLODipine (NORVASC) 5 MG tablet Take 1 tablet (5 mg total) by mouth daily. 02/11/15   Lelon Perla, MD  aspirin 81 MG EC tablet Take 81 mg by mouth daily.     Historical Provider, MD  atorvastatin (LIPITOR) 20 MG tablet TAKE ONE TABLET BY MOUTH ONCE DAILY 05/27/15   Lelon Perla, MD  benzonatate (TESSALON) 200 MG capsule Take 1 capsule (200 mg total) by mouth at bedtime. Take as needed for cough 01/16/16   Kandra Nicolas, MD  CALCIUM-VITAMIN D PO Take 1 tablet by mouth daily.    Historical Provider, MD  doxycycline (VIBRAMYCIN) 100 MG capsule Take 1 capsule (100 mg total) by mouth 2 (two) times daily. Take with food. 01/16/16   Kandra Nicolas, MD  hydrochlorothiazide (HYDRODIURIL) 25 MG tablet TAKE ONE TABLET BY MOUTH ONCE DAILY  01/11/16   [name], MD  ibuprofen (ADVIL,MOTRIN) 200 MG tablet Take 400 mg by mouth every 6 (six) hours as needed for headache or mild pain.     Historical Provider, MD  metoprolol succinate (TOPROL-XL) 100 MG 24 hr tablet TAKE ONE TABLET BY MOUTH ONCE DAILY. TAKE WITH OR IMMEDIATELY FOLLOWING A MEAL 10/25/15   [name], MD  Multiple Vitamin (MULTIVITAMIN) tablet Take 1 tablet by mouth daily.    Historical Provider, MD  predniSONE (DELTASONE) 20 MG tablet Take one tab by mouth twice daily for 5 days, then one daily for 3 days. Take with food. 01/16/16   Kandra Nicolas, MD  SYMBICORT 160-4.5 MCG/ACT inhaler INHALE ONE PUFF BY MOUTH TWICE DAILY 11/12/14   [name], MD    terazosin (HYTRIN) 2 MG capsule TAKE ONE CAPSULE BY MOUTH AT BEDTIME *NEED  FOLLOW  UP  VISIT  FOR  MORE  REFILLS 01/11/16   [name], MD  warfarin (COUMADIN) 5 MG tablet Take as directed by coumadin  clinic 01/11/16   Lelon Perla, MD    Family History Family History  Problem Relation Age of Onset  . Heart attack Father 80  . Colon cancer Mother 90  . Hypertension Mother     Social History Social History  Substance Use Topics  . Smoking status: Former Smoker    Quit date: 04/26/1991  . Smokeless tobacco: Never Used  . Alcohol use No     Allergies   Lisinopril; Carvedilol; Codeine sulfate; Ezetimibe-simvastatin; Propranolol hcl; and Ramipril   Review of Systems Review of Systems  + sore throat + cough No pleuritic pain + wheezing + nasal congestion + post-nasal drainage No sinus pain/pressure No itchy/red eyes No earache No hemoptysis + SOB with activity ? fever, + chills No nausea No vomiting No abdominal pain No diarrhea No urinary symptoms No skin rash + fatigue + myalgias + headache Used OTC meds without relief    Physical Exam Triage Vital Signs ED Triage Vitals  Enc Vitals Group     BP 01/16/16 1629 146/92     Pulse Rate 01/16/16 1629 81     Resp 01/16/16 1629 18     Temp 01/16/16 1629 97.8 F (36.6 C)     Temp Source 01/16/16 1629 Oral     SpO2 01/16/16 1629 93 %     Weight 01/16/16 1631 215 lb (97.5 kg)     Height 01/16/16 1631 6\' 4"  (1.93 m)     Head Circumference --      Peak Flow --      Pain Score 01/16/16 1632 0     Pain Loc --      Pain Edu? --      Excl. in [place]? --    No data found.   Updated Vital Signs BP 146/92 (BP Location: Left Arm)   Pulse 81   Temp 97.8 F (36.6 C) (Oral)   Resp 18   Ht 6\' 4"  (1.93 m)   Wt 215 lb (97.5 kg)   SpO2 93%   BMI 26.17 kg/m   Visual Acuity Right Eye Distance:   Left Eye Distance:   Bilateral Distance:    Right Eye Near:   Left Eye Near:    Bilateral Near:      Physical Exam Nursing notes and Vital Signs reviewed. Appearance:  Patient appears stated age, and in no acute distress Eyes:  Pupils are equal, round, and reactive to light  and accomodation.  Extraocular movement is intact.  Conjunctivae are not inflamed  Ears:  Canals normal.  Tympanic membranes normal.  Nose:  Mildly congested turbinates.  No sinus tenderness.    Pharynx:  Normal Neck:  Supple.  Tender enlarged posterior/lateral nodes are palpated bilaterally  Lungs:   Bilateral faint expiratory wheezes are heard anteriorly.  Breath sounds are equal.  Moving air well. Heart:  Regular rate and rhythm without murmurs, rubs, or gallops.  Abdomen:  Nontender without masses or hepatosplenomegaly.  Bowel sounds are present.  No CVA or flank tenderness.  Extremities:  No edema.  Skin:  No rash present.    UC Treatments / Results  Labs (all labs ordered are listed, but only abnormal results are displayed) Labs Reviewed - No data to display  EKG  EKG Interpretation None       Radiology No results found.  Procedures Procedures (including critical care time)  Medications Ordered in UC Medications  methylPREDNISolone sodium succinate (SOLU-MEDROL) 125 mg/2 mL injection 125 mg (not administered)     Initial Impression / Assessment and Plan / UC Course  I have reviewed the triage vital signs and the nursing notes.  Pertinent labs & imaging results that were available during my care of the patient were reviewed by me and considered in my medical decision making (see chart for details).  Clinical Course   Administered Solumedrol 125mg  IM; begin prednisone burst/taper tomorrow. With past history of pneumonia, will begin empiric doxycycline 100mg  BID. Prescription written for Benzonatate [facility]) to take at bedtime for night-time cough.  Begin prednisone Monday 01/17/16. Take plain guaifenesin (1200mg  extended release tabs such as Mucinex) twice daily, with plenty of water, for  cough and congestion.  Get adequate rest.   May use Afrin nasal spray (or generic oxymetazoline) twice daily for about 5 days and then discontinue.  Also recommend using saline nasal spray several times daily and saline nasal irrigation (AYR is a common brand).  Use Flonase nasal spray each morning after using Afrin nasal spray and saline nasal irrigation. Try warm salt water gargles for sore throat.  Stop all antihistamines for now, and other non-prescription cough/cold preparations. Continue Symbicort as prescribed. Follow-up with family doctor if not improving about10 days. Anticoagulation clinic records reviewed:  Last INR on 01/11/16 was 3.3 Recommend follow-up with Coumadin clinic in 3 days (doxycycline and prednisone may affect INR).     Final Clinical Impressions(s) / UC Diagnoses   Final diagnoses:  Viral URI with cough  Bronchospasm    New Prescriptions New Prescriptions   BENZONATATE (TESSALON) 200 MG CAPSULE    Take 1 capsule (200 mg total) by mouth at bedtime. Take as needed for cough   DOXYCYCLINE (VIBRAMYCIN) 100 MG CAPSULE    Take 1 capsule (100 mg total) by mouth 2 (two) times daily. Take with food.   PREDNISONE (DELTASONE) 20 MG TABLET    Take one tab by mouth twice daily for 5 days, then one daily for 3 days. Take with food.     Kandra Nicolas, MD 01/17/16 [phone]

## 2016-01-16 NOTE — Discharge Instructions (Signed)
Begin prednisone Monday 01/17/16. Take plain guaifenesin (1200mg  extended release tabs such as Mucinex) twice daily, with plenty of water, for cough and congestion.  Get adequate rest.   May use Afrin nasal spray (or generic oxymetazoline) twice daily for about 5 days and then discontinue.  Also recommend using saline nasal spray several times daily and saline nasal irrigation (AYR is a common brand).  Use Flonase nasal spray each morning after using Afrin nasal spray and saline nasal irrigation. Try warm salt water gargles for sore throat.  Stop all antihistamines for now, and other non-prescription cough/cold preparations. Continue Symbicort as prescribed. Follow-up with family doctor if not improving about10 days.

## 2016-01-16 NOTE — ED Triage Notes (Signed)
Patient reports increase in congestion and asthma symptoms over past 5 days. Used his inhaler this morning; and Ny-quil last night.

## 2016-02-08 ENCOUNTER — Ambulatory Visit (INDEPENDENT_AMBULATORY_CARE_PROVIDER_SITE_OTHER): Payer: Medicare Other | Admitting: *Deleted

## 2016-02-08 DIAGNOSIS — I059 Rheumatic mitral valve disease, unspecified: Secondary | ICD-10-CM | POA: Diagnosis not present

## 2016-02-08 DIAGNOSIS — Z9889 Other specified postprocedural states: Secondary | ICD-10-CM

## 2016-02-08 DIAGNOSIS — Z5181 Encounter for therapeutic drug level monitoring: Secondary | ICD-10-CM | POA: Diagnosis not present

## 2016-02-08 LAB — POCT INR: INR: 5.3

## 2016-02-17 ENCOUNTER — Other Ambulatory Visit: Payer: Self-pay | Admitting: Cardiology

## 2016-03-06 NOTE — Progress Notes (Deleted)
HPI:FU mitral valve replacement with a St. Jude valve in 1998. A pre-operative cardiac catheterization revealed normal coronary arteries. The last echocardiogram in Oct 2014 showed normal LV function. The left atrium was mildly dilated. 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 Abdominal ultrasound November 2015 showed no aneurysm. Since I last saw him,   Current Outpatient Prescriptions  Medication Sig Dispense Refill  . albuterol (PROAIR HFA) 108 (90 BASE) MCG/ACT inhaler Inhale 2 puffs into the lungs every 6 (six) hours as needed for wheezing. 1 Inhaler 3  . ALPRAZolam (XANAX) 0.5 MG tablet Take 1 tablet (0.5 mg total) by mouth daily as needed for anxiety. 10 tablet 0  . amLODipine (NORVASC) 5 MG tablet Take 1 tablet (5 mg total) by mouth daily. 90 tablet 3  . aspirin 81 MG EC tablet Take 81 mg by mouth daily.     Marland Kitchen atorvastatin (LIPITOR) 20 MG tablet TAKE ONE TABLET BY MOUTH ONCE DAILY 30 tablet 1  . benzonatate (TESSALON) 200 MG capsule Take 1 capsule (200 mg total) by mouth at bedtime. Take as needed for cough 12 capsule 0  . CALCIUM-VITAMIN D PO Take 1 tablet by mouth daily.    Marland Kitchen doxycycline (VIBRAMYCIN) 100 MG capsule Take 1 capsule (100 mg total) by mouth 2 (two) times daily. Take with food. 14 capsule 0  . hydrochlorothiazide (HYDRODIURIL) 25 MG tablet TAKE ONE TABLET BY MOUTH ONCE DAILY 60 tablet 5  . ibuprofen (ADVIL,MOTRIN) 200 MG tablet Take 400 mg by mouth every 6 (six) hours as needed for headache or mild pain.     . metoprolol succinate (TOPROL-XL) 100 MG 24 hr tablet TAKE ONE TABLET BY MOUTH ONCE DAILY. TAKE WITH OR IMMEDIATELY FOLLOWING A MEAL 30 tablet 3  . Multiple Vitamin (MULTIVITAMIN) tablet Take 1 tablet by mouth daily.    . predniSONE (DELTASONE) 20 MG tablet Take one tab by mouth twice daily for 5 days, then one daily for 3 days. Take with food. 13 tablet 0  . SYMBICORT 160-4.5 MCG/ACT inhaler INHALE ONE PUFF BY MOUTH TWICE DAILY 1 Inhaler 2  . terazosin (HYTRIN)  2 MG capsule TAKE ONE CAPSULE BY MOUTH AT BEDTIME *NEED  FOLLOW  UP  VISIT  FOR  MORE  REFILLS 30 capsule 0  . warfarin (COUMADIN) 5 MG tablet Take as directed by coumadin  clinic 65 tablet 0   No current facility-administered medications for this visit.      Past Medical History:  Diagnosis Date  . Allergy   . Allergy history unknown   . Anxiety   . Asthma   . CHF (congestive heart failure) ([place])   . Dyslipidemia   . HTN (hypertension)   . Labyrinthitis    hx  . Mitral valve replaced    hx    Past Surgical History:  Procedure Laterality Date  . ACNE CYST REMOVAL     from hand and back  . Jaw surgery (other)    . NASAL SINUS SURGERY N/A 10/16/2012   Procedure: NASAL ENDOSCOPIC POLYPECTOMY/MAXILLARY ANTROSTOMY/ETHMOIDECTOMY;  Surgeon: Izora Gala, MD;  Location: [facility];  Service: ENT;  Laterality: N/A;  . TOOTH EXTRACTION    . VALVE REPLACEMENT  1998   St. Jude, mitral    Social History   Social History  . Marital status: Married    Spouse name: Frenchie   . Number of children: 2  . Years of education: N/A   Occupational History  . [employer]  Social History Main Topics  . Smoking status: Former Smoker    Quit date: 04/26/1991  . Smokeless tobacco: Never Used  . Alcohol use No  . Drug use: No  . Sexual activity: Not on file   Other Topics Concern  . Not on file   Social History Narrative   Some exercise, bike and walking.  2 cups per day.     Family History  Problem Relation Age of Onset  . Heart attack Father 70  . Colon cancer Mother 41  . Hypertension Mother     ROS: no fevers or chills, productive cough, hemoptysis, dysphasia, odynophagia, melena, hematochezia, dysuria, hematuria, rash, seizure activity, orthopnea, PND, pedal edema, claudication. Remaining systems are negative.  Physical Exam: Well-developed well-nourished in no acute distress.  Skin is warm and dry.  HEENT is normal.  Neck is supple.  Chest is clear to  auscultation with normal expansion.  Cardiovascular exam is regular rate and rhythm.  Abdominal exam nontender or distended. No masses palpated. Extremities show no edema. neuro grossly intact  ECG

## 2016-03-14 ENCOUNTER — Ambulatory Visit: Payer: Medicare Other | Admitting: Cardiology

## 2016-03-27 NOTE — Progress Notes (Deleted)
HPI: FU mitral valve replacement with a St. Jude valve in 1998. A pre-operative cardiac catheterization revealed normal coronary arteries. The last echocardiogram in Oct 2014 showed normal LV function. The left atrium was mildly dilated. 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 Abdominal ultrasound November 2015 showed no aneurysm. Since I last saw him,   Current Outpatient Prescriptions  Medication Sig Dispense Refill  . albuterol (PROAIR HFA) 108 (90 BASE) MCG/ACT inhaler Inhale 2 puffs into the lungs every 6 (six) hours as needed for wheezing. 1 Inhaler 3  . ALPRAZolam (XANAX) 0.5 MG tablet Take 1 tablet (0.5 mg total) by mouth daily as needed for anxiety. 10 tablet 0  . amLODipine (NORVASC) 5 MG tablet Take 1 tablet (5 mg total) by mouth daily. 90 tablet 3  . aspirin 81 MG EC tablet Take 81 mg by mouth daily.     Marland Kitchen atorvastatin (LIPITOR) 20 MG tablet TAKE ONE TABLET BY MOUTH ONCE DAILY 30 tablet 1  . benzonatate (TESSALON) 200 MG capsule Take 1 capsule (200 mg total) by mouth at bedtime. Take as needed for cough 12 capsule 0  . CALCIUM-VITAMIN D PO Take 1 tablet by mouth daily.    Marland Kitchen doxycycline (VIBRAMYCIN) 100 MG capsule Take 1 capsule (100 mg total) by mouth 2 (two) times daily. Take with food. 14 capsule 0  . hydrochlorothiazide (HYDRODIURIL) 25 MG tablet TAKE ONE TABLET BY MOUTH ONCE DAILY 60 tablet 5  . ibuprofen (ADVIL,MOTRIN) 200 MG tablet Take 400 mg by mouth every 6 (six) hours as needed for headache or mild pain.     . metoprolol succinate (TOPROL-XL) 100 MG 24 hr tablet TAKE ONE TABLET BY MOUTH ONCE DAILY. TAKE WITH OR IMMEDIATELY FOLLOWING A MEAL 30 tablet 3  . Multiple Vitamin (MULTIVITAMIN) tablet Take 1 tablet by mouth daily.    . predniSONE (DELTASONE) 20 MG tablet Take one tab by mouth twice daily for 5 days, then one daily for 3 days. Take with food. 13 tablet 0  . SYMBICORT 160-4.5 MCG/ACT inhaler INHALE ONE PUFF BY MOUTH TWICE DAILY 1 Inhaler 2  . terazosin  (HYTRIN) 2 MG capsule TAKE ONE CAPSULE BY MOUTH AT BEDTIME *NEED  FOLLOW  UP  VISIT  FOR  MORE  REFILLS 30 capsule 0  . warfarin (COUMADIN) 5 MG tablet Take as directed by coumadin  clinic 65 tablet 0   No current facility-administered medications for this visit.      Past Medical History:  Diagnosis Date  . Allergy   . Allergy history unknown   . Anxiety   . Asthma   . CHF (congestive heart failure) ([place])   . Dyslipidemia   . HTN (hypertension)   . Labyrinthitis    hx  . Mitral valve replaced    hx    Past Surgical History:  Procedure Laterality Date  . ACNE CYST REMOVAL     from hand and back  . Jaw surgery (other)    . NASAL SINUS SURGERY N/A 10/16/2012   Procedure: NASAL ENDOSCOPIC POLYPECTOMY/MAXILLARY ANTROSTOMY/ETHMOIDECTOMY;  Surgeon: Izora Gala, MD;  Location: [facility];  Service: ENT;  Laterality: N/A;  . TOOTH EXTRACTION    . VALVE REPLACEMENT  1998   St. Jude, mitral    Social History   Social History  . Marital status: Married    Spouse name: Frenchie   . Number of children: 2  . Years of education: N/A   Occupational History  . [employer]  Social History Main Topics  . Smoking status: Former Smoker    Quit date: 04/26/1991  . Smokeless tobacco: Never Used  . Alcohol use No  . Drug use: No  . Sexual activity: Not on file   Other Topics Concern  . Not on file   Social History Narrative   Some exercise, bike and walking.  2 cups per day.     Family History  Problem Relation Age of Onset  . Heart attack Father 66  . Colon cancer Mother 77  . Hypertension Mother     ROS: no fevers or chills, productive cough, hemoptysis, dysphasia, odynophagia, melena, hematochezia, dysuria, hematuria, rash, seizure activity, orthopnea, PND, pedal edema, claudication. Remaining systems are negative.  Physical Exam: Well-developed well-nourished in no acute distress.  Skin is warm and dry.  HEENT is normal.  Neck is supple.  Chest is clear to  auscultation with normal expansion.  Cardiovascular exam is regular rate and rhythm.  Abdominal exam nontender or distended. No masses palpated. Extremities show no edema. neuro grossly intact  ECG

## 2016-03-29 ENCOUNTER — Other Ambulatory Visit: Payer: Self-pay | Admitting: Family Medicine

## 2016-04-01 ENCOUNTER — Other Ambulatory Visit: Payer: Self-pay | Admitting: Family Medicine

## 2016-04-03 ENCOUNTER — Emergency Department (INDEPENDENT_AMBULATORY_CARE_PROVIDER_SITE_OTHER)
Admission: EM | Admit: 2016-04-03 | Discharge: 2016-04-03 | Disposition: A | Discharge status: Home / Self Care | Payer: Medicare Other | Source: Home / Self Care | Attending: Family Medicine | Admitting: Family Medicine

## 2016-04-03 ENCOUNTER — Other Ambulatory Visit: Payer: Self-pay | Admitting: Family Medicine

## 2016-04-03 DIAGNOSIS — B9789 Other viral agents as the cause of diseases classified elsewhere: Secondary | ICD-10-CM | POA: Diagnosis not present

## 2016-04-03 DIAGNOSIS — J9801 Acute bronchospasm: Secondary | ICD-10-CM

## 2016-04-03 DIAGNOSIS — J069 Acute upper respiratory infection, unspecified: Secondary | ICD-10-CM

## 2016-04-03 MED ORDER — DOXYCYCLINE HYCLATE 100 MG PO CAPS: 100.0000 mg | ORAL_CAPSULE | Freq: Two times a day (BID) | ORAL | 0 refills | Status: DC

## 2016-04-03 MED ORDER — PREDNISONE 20 MG PO TABS: ORAL_TABLET | ORAL | 0 refills | Status: DC

## 2016-04-03 MED ORDER — BENZONATATE 200 MG PO CAPS: ORAL_CAPSULE | ORAL | 0 refills | Status: DC

## 2016-04-03 MED ORDER — METHYLPREDNISOLONE SODIUM SUCC 125 MG IJ SOLR
125.0000 mg | Freq: Once | INTRAMUSCULAR | Status: AC
  Administered 2016-04-03: 125 mg via INTRAMUSCULAR

## 2016-04-03 NOTE — ED Provider Notes (Signed)
[facility]    CSN: [id number] Arrival date & time: 04/03/16  1726     History   Chief Complaint Chief Complaint  Patient presents with  . Cough    HPI [name] is a 66 y.o. male.   While driving 3 days ago patient developed increased shortness of breath and sinus congestion.  He has become fatigued with mild myalgias and sweats.  He has asthma. He has a past history of pneumonia about 4 to 5 years ago.   The history is provided by the patient.    Past Medical History:  Diagnosis Date  . Allergy   . Allergy history unknown   . Anxiety   . Asthma   . CHF (congestive heart failure) ([place])   . Dyslipidemia   . HTN (hypertension)   . Labyrinthitis    hx  . Mitral valve replaced    hx    Patient Active Problem List   Diagnosis Date Noted  . Bruit 01/08/2014  . IFG (impaired fasting glucose) 09/23/2013  . Lung nodule 03/31/2013  . BPH (benign prostatic hyperplasia) 09/27/2012  . Sebaceous cyst 04/26/2011  . Lipoma of back 04/26/2011  . Long term (current) use of anticoagulants 01/25/2011  . Mitral valve disorder 04/14/2010  . WEIGHT LOSS 02/11/2010  . Hyperlipidemia 06/03/2007  . Anxiety state 06/03/2007  . Essential hypertension 06/03/2007  . ASTHMA 06/03/2007  . ALLERGY 06/03/2007  . LABYRINTHITIS, HX OF 06/03/2007  . MITRAL VALVE REPLACEMENT, HX OF 06/03/2007    Past Surgical History:  Procedure Laterality Date  . ACNE CYST REMOVAL     from hand and back  . Jaw surgery (other)    . NASAL SINUS SURGERY N/A 10/16/2012   Procedure: NASAL ENDOSCOPIC POLYPECTOMY/MAXILLARY ANTROSTOMY/ETHMOIDECTOMY;  Surgeon: Izora Gala, MD;  Location: [facility];  Service: ENT;  Laterality: N/A;  . TOOTH EXTRACTION    . VALVE REPLACEMENT  1998   St. Jude, mitral       Home Medications    Prior to Admission medications   Medication Sig Start Date End Date Taking? Authorizing Provider  ALPRAZolam Duanne Moron) 0.5 MG tablet Take 1 tablet (0.5 mg total) by  mouth daily as needed for anxiety. 12/08/14  Yes [name], MD  amLODipine (NORVASC) 5 MG tablet Take 1 tablet (5 mg total) by mouth daily. 02/11/15  Yes Lelon Perla, MD  aspirin 81 MG EC tablet Take 81 mg by mouth daily.    Yes Historical Provider, MD  atorvastatin (LIPITOR) 20 MG tablet TAKE ONE TABLET BY MOUTH ONCE DAILY 02/18/16  Yes Lelon Perla, MD  CALCIUM-VITAMIN D PO Take 1 tablet by mouth daily.   Yes Historical Provider, MD  hydrochlorothiazide (HYDRODIURIL) 25 MG tablet TAKE ONE TABLET BY MOUTH ONCE DAILY 01/11/16  Yes [name], MD  ibuprofen (ADVIL,MOTRIN) 200 MG tablet Take 400 mg by mouth every 6 (six) hours as needed for headache or mild pain.    Yes Historical Provider, MD  metoprolol succinate (TOPROL-XL) 100 MG 24 hr tablet TAKE ONE TABLET BY MOUTH ONCE DAILY. TAKE WITH OR IMMEDIATELY FOLLOWING A MEAL 10/25/15  Yes [name], MD  Multiple Vitamin (MULTIVITAMIN) tablet Take 1 tablet by mouth daily.   Yes Historical Provider, MD  SYMBICORT 160-4.5 MCG/ACT inhaler INHALE ONE PUFF BY MOUTH TWICE DAILY 11/12/14  Yes [name], MD  terazosin (HYTRIN) 2 MG capsule TAKE ONE CAPSULE BY MOUTH AT BEDTIME *NEED  FOLLOW  UP  VISIT  FOR  MORE  REFILLS 01/11/16  Yes [name], MD  warfarin (COUMADIN) 5 MG tablet Take as directed by coumadin  clinic 01/11/16  Yes Lelon Perla, MD  albuterol (PROAIR HFA) 108 (90 BASE) MCG/ACT inhaler Inhale 2 puffs into the lungs every 6 (six) hours as needed for wheezing. 10/18/12 01/13/14  [name], MD  benzonatate (TESSALON) 200 MG capsule Take one cap by mouth at bedtime as needed for cough.  May repeat in 4 to 6 hours 04/03/16   Kandra Nicolas, MD  doxycycline (VIBRAMYCIN) 100 MG capsule Take 1 capsule (100 mg total) by mouth 2 (two) times daily. Take with food. 04/03/16   Kandra Nicolas, MD  predniSONE (DELTASONE) 20 MG tablet Take one tab by mouth twice daily for 5 days, then one daily.  Take with food. 04/03/16   Kandra Nicolas, MD    Family History Family History  Problem Relation Age of Onset  . Heart attack Father 39  . Colon cancer Mother 80  . Hypertension Mother     Social History Social History  Substance Use Topics  . Smoking status: Former Smoker    Quit date: 04/26/1991  . Smokeless tobacco: Never Used  . Alcohol use No     Allergies   Lisinopril; Carvedilol; Codeine sulfate; Ezetimibe-simvastatin; Propranolol hcl; and Ramipril   Review of Systems Review of Systems No sore throat + cough No pleuritic pain + wheezing + nasal congestion + post-nasal drainage No sinus pain/pressure No itchy/red eyes No earache No hemoptysis + SOB No fever, + chills/sweats No nausea No vomiting No abdominal pain No diarrhea No urinary symptoms No skin rash + fatigue + myalgias + headache Used OTC meds without relief   Physical Exam Triage Vital Signs ED Triage Vitals  Enc Vitals Group     BP 04/03/16 1848 (!) 163/101     Pulse Rate 04/03/16 1848 74     Resp --      Temp 04/03/16 1848 97.5 F (36.4 C)     Temp Source 04/03/16 1848 Oral     SpO2 04/03/16 1848 98 %     Weight 04/03/16 1850 202 lb (91.6 kg)     Height 04/03/16 1850 6\' 4"  (1.93 m)     Head Circumference --      Peak Flow --      Pain Score --      Pain Loc --      Pain Edu? --      Excl. in [place]? --    No data found.   Updated Vital Signs BP (!) 163/101 (BP Location: Left Arm)   Pulse 74   Temp 97.5 F (36.4 C) (Oral)   Ht 6\' 4"  (1.93 m)   Wt 202 lb (91.6 kg)   SpO2 98%   BMI 24.59 kg/m   Visual Acuity Right Eye Distance:   Left Eye Distance:   Bilateral Distance:    Right Eye Near:   Left Eye Near:    Bilateral Near:     Physical Exam Nursing notes and Vital Signs reviewed. Appearance:  Patient appears stated age, and in no acute distress Eyes:  Pupils are equal, round, and reactive to light and accomodation.  Extraocular movement is intact.  Conjunctivae  are not inflamed  Ears:  Canals normal.  Tympanic membranes normal.  Nose:  Mildly congested turbinates.  No sinus tenderness.   Pharynx:  Normal Neck:  Supple.  Tender enlarged posterior/lateral nodes are  palpated bilaterally  Lungs:   Bilateral expiratory wheezes posteriorly.  Breath sounds are equal.  Moving air well. Heart:  Regular rate and rhythm without murmurs, rubs, or gallops.  Abdomen:  Nontender without masses or hepatosplenomegaly.  Bowel sounds are present.  No CVA or flank tenderness.  Extremities:  No edema.  Skin:  No rash present.    UC Treatments / Results  Labs (all labs ordered are listed, but only abnormal results are displayed) Labs Reviewed - No data to display  EKG  EKG Interpretation None       Radiology No results found.  Procedures Procedures (including critical care time)  Medications Ordered in UC Medications  methylPREDNISolone sodium succinate (SOLU-MEDROL) 125 mg/2 mL injection 125 mg (not administered)     Initial Impression / Assessment and Plan / UC Course  I have reviewed the triage vital signs and the nursing notes.  Pertinent labs & imaging results that were available during my care of the patient were reviewed by me and considered in my medical decision making (see chart for details).    Administered Solumedrol 125mg  IM  Begin doxycycline for atypical coverage. Prescription written for Benzonatate [facility]) to take at bedtime for night-time cough.  Begin prednisone Tuesday April 04, 1916. Take plain guaifenesin (1200mg  extended release tabs such as Mucinex) twice daily, with plenty of water, for cough and congestion.  Get adequate rest.   Also recommend using saline nasal spray several times daily and saline nasal irrigation (AYR is a common brand).    Try warm salt water gargles for sore throat.  Stop all antihistamines for now, and other non-prescription cough/cold preparations. Continue Symbicort.  May use albuterol  inhaler as needed. Followup with Family Doctor if not improved in 7 to 10 days.    Final Clinical Impressions(s) / UC Diagnoses   Final diagnoses:  Viral URI with cough  Bronchospasm    New Prescriptions New Prescriptions   BENZONATATE (TESSALON) 200 MG CAPSULE    Take one cap by mouth at bedtime as needed for cough.  May repeat in 4 to 6 hours   PREDNISONE (DELTASONE) 20 MG TABLET    Take one tab by mouth twice daily for 5 days, then one daily. Take with food.     Kandra Nicolas, MD 04/09/16 1009

## 2016-04-03 NOTE — ED Triage Notes (Signed)
Patients states that he has some drainage, he has been fatigued, has a cough and states that he is a asthmatic and feels a little SOB , O2 is 98%

## 2016-04-03 NOTE — Discharge Instructions (Signed)
Begin prednisone Tuesday April 04, 1916. Take plain guaifenesin (1200mg  extended release tabs such as Mucinex) twice daily, with plenty of water, for cough and congestion.  Get adequate rest.   Also recommend using saline nasal spray several times daily and saline nasal irrigation (AYR is a common brand).    Try warm salt water gargles for sore throat.  Stop all antihistamines for now, and other non-prescription cough/cold preparations. Continue Symbicort.  May use albuterol inhaler as needed.

## 2016-04-06 ENCOUNTER — Encounter: Payer: Self-pay | Admitting: *Deleted

## 2016-04-06 ENCOUNTER — Ambulatory Visit: Payer: Medicare Other | Admitting: Cardiology

## 2016-04-20 ENCOUNTER — Ambulatory Visit (INDEPENDENT_AMBULATORY_CARE_PROVIDER_SITE_OTHER): Payer: Medicare Other

## 2016-04-20 DIAGNOSIS — I059 Rheumatic mitral valve disease, unspecified: Secondary | ICD-10-CM

## 2016-04-20 DIAGNOSIS — Z9889 Other specified postprocedural states: Secondary | ICD-10-CM | POA: Diagnosis not present

## 2016-04-20 LAB — POCT INR: INR: 3.2

## 2016-04-20 MED ORDER — WARFARIN SODIUM 10 MG PO TABS: ORAL_TABLET | ORAL | 0 refills | Status: DC

## 2016-05-08 NOTE — Progress Notes (Signed)
HPI: FU mitral valve replacement with a St. Jude valve in 1998. A pre-operative cardiac catheterization revealed normal coronary arteries. The last echocardiogram in Oct 2014 showed normal LV function. The left atrium was mildly dilated. 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 Abdominal ultrasound November 2015 showed no aneurysm. Since I last saw him, patient has some dyspnea on exertion but no orthopnea, PND, pedal edema, chest pain, syncope or bleeding.   Current Outpatient Prescriptions  Medication Sig Dispense Refill  . ALPRAZolam (XANAX) 0.5 MG tablet Take 1 tablet (0.5 mg total) by mouth daily as needed for anxiety. 10 tablet 0  . amLODipine (NORVASC) 5 MG tablet TAKE ONE TABLET BY MOUTH ONCE DAILY 90 tablet 0  . aspirin 81 MG EC tablet Take 81 mg by mouth daily.     Marland Kitchen atorvastatin (LIPITOR) 20 MG tablet TAKE ONE TABLET BY MOUTH ONCE DAILY 30 tablet 1  . benzonatate (TESSALON) 200 MG capsule Take one cap by mouth at bedtime as needed for cough.  May repeat in 4 to 6 hours 15 capsule 0  . CALCIUM-VITAMIN D PO Take 1 tablet by mouth daily.    . hydrochlorothiazide (HYDRODIURIL) 25 MG tablet TAKE ONE TABLET BY MOUTH ONCE DAILY 60 tablet 5  . ibuprofen (ADVIL,MOTRIN) 200 MG tablet Take 400 mg by mouth every 6 (six) hours as needed for headache or mild pain.     . metoprolol succinate (TOPROL-XL) 100 MG 24 hr tablet TAKE ONE TABLET BY MOUTH ONCE DAILY WITH  OR  IMMEDIATELY  FOLLOWING  A  MEAL 30 tablet 3  . Multiple Vitamin (MULTIVITAMIN) tablet Take 1 tablet by mouth daily.    . SYMBICORT 160-4.5 MCG/ACT inhaler INHALE ONE PUFF BY MOUTH TWICE DAILY 1 Inhaler 2  . warfarin (COUMADIN) 10 MG tablet Take as directed by [facility] Clinic 30 tablet 0  . albuterol (PROAIR HFA) 108 (90 BASE) MCG/ACT inhaler Inhale 2 puffs into the lungs every 6 (six) hours as needed for wheezing. 1 Inhaler 3   No current facility-administered medications for this visit.      Past Medical History:    Diagnosis Date  . Allergy   . Allergy history unknown   . Anxiety   . Asthma   . CHF (congestive heart failure) ([place])   . Dyslipidemia   . HTN (hypertension)   . Labyrinthitis    hx  . Mitral valve replaced    hx    Past Surgical History:  Procedure Laterality Date  . ACNE CYST REMOVAL     from hand and back  . Jaw surgery (other)    . NASAL SINUS SURGERY N/A 10/16/2012   Procedure: NASAL ENDOSCOPIC POLYPECTOMY/MAXILLARY ANTROSTOMY/ETHMOIDECTOMY;  Surgeon: Izora Gala, MD;  Location: [facility];  Service: ENT;  Laterality: N/A;  . TOOTH EXTRACTION    . VALVE REPLACEMENT  1998   St. Jude, mitral    Social History   Social History  . Marital status: Married    Spouse name: Frenchie   . Number of children: 2  . Years of education: N/A   Occupational History  . SALES [employer]   Social History Main Topics  . Smoking status: Former Smoker    Quit date: 04/26/1991  . Smokeless tobacco: Never Used  . Alcohol use No  . Drug use: No  . Sexual activity: Not on file   Other Topics Concern  . Not on file   Social History Narrative   Some exercise, bike  and walking.  2 cups per day.     Family History  Problem Relation Age of Onset  . Heart attack Father 54  . Colon cancer Mother 63  . Hypertension Mother     ROS: no fevers or chills, productive cough, hemoptysis, dysphasia, odynophagia, melena, hematochezia, dysuria, hematuria, rash, seizure activity, orthopnea, PND, pedal edema, claudication. Remaining systems are negative.  Physical Exam: Well-developed well-nourished in no acute distress.  Skin is warm and dry.  HEENT is normal.  Neck is supple. No bruit Chest is clear to auscultation with normal expansion.  Cardiovascular exam is regular rate and rhythm. Crisp mechanical valve sound Abdominal exam nontender or distended. No masses palpated. Extremities show no edema. neuro grossly intact  ECG- Sinus rhythm at a rate of 73. Normal axis. Left ventricular  hypertrophy with repolarization abnormality. Prolonged QT interval; personally reviewed  A/P  1 Status post mitral valve replacement-continue SBE prophylaxis. Continue Coumadin and ASA 81 mg daily. Check Hgb. Some increased DOE. Now 20 yrs since valve replacement. Repeat echocardiogram.   2 hypertension-blood pressure controlled. Continue present medications. Check potassium and renal function.  3 hyperlipidemia-continue statin. Check lipids and liver.  Kirk Ruths, MD

## 2016-05-10 ENCOUNTER — Other Ambulatory Visit: Payer: Self-pay | Admitting: Cardiology

## 2016-05-10 DIAGNOSIS — I10 Essential (primary) hypertension: Secondary | ICD-10-CM

## 2016-05-10 DIAGNOSIS — Z23 Encounter for immunization: Secondary | ICD-10-CM | POA: Diagnosis not present

## 2016-05-11 NOTE — Telephone Encounter (Signed)
Rx(s) sent to pharmacy electronically.  

## 2016-05-12 ENCOUNTER — Encounter: Payer: Self-pay | Admitting: Cardiology

## 2016-05-12 ENCOUNTER — Ambulatory Visit (INDEPENDENT_AMBULATORY_CARE_PROVIDER_SITE_OTHER): Payer: Medicare Other | Admitting: Cardiology

## 2016-05-12 VITALS — BP 130/78 | HR 73 | Ht 76.0 in | Wt 200.0 lb

## 2016-05-12 DIAGNOSIS — I10 Essential (primary) hypertension: Secondary | ICD-10-CM

## 2016-05-12 DIAGNOSIS — E7800 Pure hypercholesterolemia, unspecified: Secondary | ICD-10-CM | POA: Diagnosis not present

## 2016-05-12 DIAGNOSIS — I059 Rheumatic mitral valve disease, unspecified: Secondary | ICD-10-CM

## 2016-05-12 LAB — BASIC METABOLIC PANEL
BUN: 13 mg/dL (ref 7–25)
CO2: 30 mmol/L (ref 20–31)
CREATININE: 0.93 mg/dL (ref 0.70–1.25)
Calcium: 9.3 mg/dL (ref 8.6–10.3)
Chloride: 98 mmol/L (ref 98–110)
GLUCOSE: 98 mg/dL (ref 65–99)
POTASSIUM: 3.7 mmol/L (ref 3.5–5.3)
Sodium: 137 mmol/L (ref 135–146)

## 2016-05-12 LAB — CBC
HEMATOCRIT: 45.5 % (ref 38.5–50.0)
HEMOGLOBIN: 15 g/dL (ref 13.2–17.1)
MCH: 28.8 pg (ref 27.0–33.0)
MCHC: 33 g/dL (ref 32.0–36.0)
MCV: 87.5 fL (ref 80.0–100.0)
MPV: 10.4 fL (ref 7.5–12.5)
Platelets: 206 10*3/uL (ref 140–400)
RBC: 5.2 MIL/uL (ref 4.20–5.80)
RDW: 14.3 % (ref 11.0–15.0)
WBC: 4.7 10*3/uL (ref 3.8–10.8)

## 2016-05-12 LAB — HEPATIC FUNCTION PANEL
ALK PHOS: 80 U/L (ref 40–115)
ALT: 21 U/L (ref 9–46)
AST: 36 U/L — ABNORMAL HIGH (ref 10–35)
Albumin: 4.1 g/dL (ref 3.6–5.1)
BILIRUBIN DIRECT: 0.1 mg/dL (ref <=0.2)
BILIRUBIN INDIRECT: 0.5 mg/dL (ref 0.2–1.2)
TOTAL PROTEIN: 7.5 g/dL (ref 6.1–8.1)
Total Bilirubin: 0.6 mg/dL (ref 0.2–1.2)

## 2016-05-12 LAB — LIPID PANEL
CHOLESTEROL: 216 mg/dL — AB (ref <200)
HDL: 40 mg/dL — ABNORMAL LOW (ref >40)
LDL Cholesterol: 144 mg/dL — ABNORMAL HIGH (ref <100)
TRIGLYCERIDES: 160 mg/dL — AB (ref <150)
Total CHOL/HDL Ratio: 5.4 Ratio — ABNORMAL HIGH (ref <5.0)
VLDL: 32 mg/dL — AB (ref <30)

## 2016-05-12 NOTE — Patient Instructions (Signed)
Medication Instructions:   NO CHANGE  Labwork:  Your physician recommends that you HAVE LAB WORK TODAY  Testing/Procedures:  Your physician has requested that you have an echocardiogram. Echocardiography is a painless test that uses sound waves to create images of your heart. It provides your doctor with information about the size and shape of your heart and how well your heart's chambers and valves are working. This procedure takes approximately one hour. There are no restrictions for this procedure.    Follow-Up:  Your physician wants you to follow-up in: ONE YEAR WITH DR CRENSHAW You will receive a reminder letter in the mail two months in advance. If you don't receive a letter, please call our office to schedule the follow-up appointment.   If you need a refill on your cardiac medications before your next appointment, please call your pharmacy.    

## 2016-05-15 ENCOUNTER — Telehealth: Payer: Self-pay | Admitting: *Deleted

## 2016-05-15 NOTE — Telephone Encounter (Signed)
Notes Recorded by Cristopher Estimable, RN on 05/15/2016 at 8:51 AM EDT Change lipitor to 40 mg daily; lipids and liver 4 weeks  Kirk Ruths  Left message for pt to call

## 2016-05-15 NOTE — Telephone Encounter (Signed)
-----   Message from Lelon Perla, MD sent at 05/12/2016  3:26 PM EST ----- [facility]

## 2016-05-16 ENCOUNTER — Encounter: Payer: Self-pay | Admitting: Family Medicine

## 2016-05-18 NOTE — Telephone Encounter (Signed)
Unable to reach pt or leave a message  

## 2016-05-23 ENCOUNTER — Other Ambulatory Visit: Payer: Self-pay | Admitting: Cardiology

## 2016-05-24 ENCOUNTER — Ambulatory Visit (INDEPENDENT_AMBULATORY_CARE_PROVIDER_SITE_OTHER): Payer: Medicare Other | Admitting: Pharmacist

## 2016-05-24 DIAGNOSIS — Z9889 Other specified postprocedural states: Secondary | ICD-10-CM | POA: Diagnosis not present

## 2016-05-24 DIAGNOSIS — I059 Rheumatic mitral valve disease, unspecified: Secondary | ICD-10-CM

## 2016-05-24 LAB — POCT INR: INR: 1.9

## 2016-05-24 MED ORDER — WARFARIN SODIUM 10 MG PO TABS: ORAL_TABLET | ORAL | 1 refills | Status: DC

## 2016-05-24 NOTE — Telephone Encounter (Signed)
I'll let you determine how much to refill this

## 2016-05-29 ENCOUNTER — Other Ambulatory Visit (HOSPITAL_COMMUNITY): Payer: Medicare Other

## 2016-05-30 ENCOUNTER — Encounter: Payer: Self-pay | Admitting: *Deleted

## 2016-05-30 NOTE — Telephone Encounter (Signed)
Letter mailed to patient requesting he call to discuss.

## 2016-06-02 ENCOUNTER — Encounter: Payer: Self-pay | Admitting: Cardiology

## 2016-06-02 ENCOUNTER — Telehealth: Payer: Self-pay | Admitting: *Deleted

## 2016-06-02 NOTE — Telephone Encounter (Signed)
I left a message for [facility] to reschedule his echocardiogram,and a letter was mailed.

## 2016-06-07 ENCOUNTER — Ambulatory Visit (INDEPENDENT_AMBULATORY_CARE_PROVIDER_SITE_OTHER): Payer: Medicare Other | Admitting: *Deleted

## 2016-06-07 DIAGNOSIS — Z9889 Other specified postprocedural states: Secondary | ICD-10-CM

## 2016-06-07 DIAGNOSIS — I059 Rheumatic mitral valve disease, unspecified: Secondary | ICD-10-CM | POA: Diagnosis not present

## 2016-06-07 LAB — POCT INR: INR: 3

## 2016-07-06 ENCOUNTER — Other Ambulatory Visit: Payer: Self-pay

## 2016-07-06 ENCOUNTER — Ambulatory Visit (HOSPITAL_COMMUNITY): Payer: Medicare Other | Attending: Cardiology

## 2016-07-06 ENCOUNTER — Encounter (INDEPENDENT_AMBULATORY_CARE_PROVIDER_SITE_OTHER): Payer: Self-pay

## 2016-07-06 DIAGNOSIS — Z952 Presence of prosthetic heart valve: Secondary | ICD-10-CM | POA: Insufficient documentation

## 2016-07-06 DIAGNOSIS — I340 Nonrheumatic mitral (valve) insufficiency: Secondary | ICD-10-CM | POA: Diagnosis not present

## 2016-07-06 DIAGNOSIS — I77810 Thoracic aortic ectasia: Secondary | ICD-10-CM | POA: Insufficient documentation

## 2016-07-06 DIAGNOSIS — I517 Cardiomegaly: Secondary | ICD-10-CM | POA: Insufficient documentation

## 2016-07-06 DIAGNOSIS — I361 Nonrheumatic tricuspid (valve) insufficiency: Secondary | ICD-10-CM | POA: Diagnosis not present

## 2016-07-06 DIAGNOSIS — I059 Rheumatic mitral valve disease, unspecified: Secondary | ICD-10-CM | POA: Diagnosis not present

## 2016-07-06 DIAGNOSIS — I420 Dilated cardiomyopathy: Secondary | ICD-10-CM | POA: Insufficient documentation

## 2016-07-07 ENCOUNTER — Other Ambulatory Visit: Payer: Self-pay | Admitting: Cardiology

## 2016-07-07 DIAGNOSIS — I10 Essential (primary) hypertension: Secondary | ICD-10-CM

## 2016-07-12 ENCOUNTER — Other Ambulatory Visit: Payer: Self-pay | Admitting: Cardiology

## 2016-07-12 DIAGNOSIS — I10 Essential (primary) hypertension: Secondary | ICD-10-CM

## 2016-07-13 ENCOUNTER — Ambulatory Visit (INDEPENDENT_AMBULATORY_CARE_PROVIDER_SITE_OTHER): Payer: Medicare Other | Admitting: *Deleted

## 2016-07-13 DIAGNOSIS — I059 Rheumatic mitral valve disease, unspecified: Secondary | ICD-10-CM | POA: Diagnosis not present

## 2016-07-13 DIAGNOSIS — Z9889 Other specified postprocedural states: Secondary | ICD-10-CM

## 2016-07-13 DIAGNOSIS — Z7901 Long term (current) use of anticoagulants: Secondary | ICD-10-CM | POA: Diagnosis not present

## 2016-07-13 LAB — POCT INR: INR: 2.2

## 2016-07-13 MED ORDER — WARFARIN SODIUM 10 MG PO TABS: ORAL_TABLET | ORAL | 1 refills | Status: DC

## 2016-07-17 NOTE — Progress Notes (Signed)
HPI: FU mitral valve replacement with a St. Jude valve in 1998. A pre-operative cardiac catheterization revealed normal coronary arteries. Echocardiogram in Oct 2014 showed normal LV function. The left atrium was mildly dilated. 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 Abdominal ultrasound November 2015 showed no aneurysm. Echocardiogram repeated May 2018 and showed newly reduced LV function with ejection fraction 35%, mild left ventricular hypertrophy, normally functioning mitral valve, mild left atrial enlargement, mild right ventricular enlargement. Since I last saw him, the patient has dyspnea with more extreme activities but not with routine activities. It is relieved with rest. It is not associated with chest pain. There is no orthopnea, PND or pedal edema. There is no syncope or palpitations. There is no exertional chest pain.   Current Outpatient Prescriptions  Medication Sig Dispense Refill  . albuterol (PROAIR HFA) 108 (90 BASE) MCG/ACT inhaler Inhale 2 puffs into the lungs every 6 (six) hours as needed for wheezing. 1 Inhaler 3  . ALPRAZolam (XANAX) 0.5 MG tablet Take 1 tablet (0.5 mg total) by mouth daily as needed for anxiety. 10 tablet 0  . amLODipine (NORVASC) 5 MG tablet TAKE 1 TABLET BY MOUTH ONCE DAILY 90 tablet 3  . aspirin 81 MG EC tablet Take 81 mg by mouth daily.     Marland Kitchen atorvastatin (LIPITOR) 20 MG tablet TAKE ONE TABLET BY MOUTH ONCE DAILY 30 tablet 1  . CALCIUM-VITAMIN D PO Take 1 tablet by mouth daily.    . hydrochlorothiazide (HYDRODIURIL) 25 MG tablet TAKE ONE TABLET BY MOUTH ONCE DAILY 60 tablet 5  . ibuprofen (ADVIL,MOTRIN) 200 MG tablet Take 400 mg by mouth every 6 (six) hours as needed for headache or mild pain.     . metoprolol succinate (TOPROL-XL) 100 MG 24 hr tablet TAKE ONE TABLET BY MOUTH ONCE DAILY WITH  OR  IMMEDIATELY  FOLLOWING  A  MEAL 30 tablet 3  . Multiple Vitamin (MULTIVITAMIN) tablet Take 1 tablet by mouth daily.    . SYMBICORT 160-4.5 MCG/ACT  inhaler INHALE ONE PUFF BY MOUTH TWICE DAILY 1 Inhaler 2  . warfarin (COUMADIN) 10 MG tablet Take as directed by [facility] 30 tablet 1   No current facility-administered medications for this visit.      Past Medical History:  Diagnosis Date  . Allergy   . Allergy history unknown   . Anxiety   . Asthma   . CHF (congestive heart failure) ([place])   . Dyslipidemia   . HTN (hypertension)   . Labyrinthitis    hx  . Mitral valve replaced    hx    Past Surgical History:  Procedure Laterality Date  . ACNE CYST REMOVAL     from hand and back  . Jaw surgery (other)    . NASAL SINUS SURGERY N/A 10/16/2012   Procedure: NASAL ENDOSCOPIC POLYPECTOMY/MAXILLARY ANTROSTOMY/ETHMOIDECTOMY;  Surgeon: Izora Gala, MD;  Location: [facility];  Service: ENT;  Laterality: N/A;  . TOOTH EXTRACTION    . VALVE REPLACEMENT  1998   St. Jude, mitral    Social History   Social History  . Marital status: Married    Spouse name: Frenchie   . Number of children: 2  . 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 Smoking status: Former Smoker    Quit date: 04/26/1991  . Smokeless tobacco: Never Used  . Alcohol use No  . Drug use: No  .  Sexual activity: Not on file   Other Topics Concern  . Not on file   Social History Narrative   Some exercise, bike and walking.  2 cups per day.     Family History  Problem Relation Age of Onset  . Heart attack Father 55  . Colon cancer Mother 26  . Hypertension Mother     ROS: no fevers or chills, productive cough, hemoptysis, dysphasia, odynophagia, melena, hematochezia, dysuria, hematuria, rash, seizure activity, orthopnea, PND, pedal edema, claudication. Remaining systems are negative.  Physical Exam: Well-developed well-nourished in no acute distress.  Skin is warm and dry.  HEENT is normal.  Neck is supple. No jugular venous distention Chest is clear to auscultation with normal  expansion.  Cardiovascular exam is regular rate and rhythm. Crisp mechanical valve sound with no murmur Abdominal exam nontender or distended. No masses palpated. Extremities show no edema. neuro grossly intact   A/P  1 Cardiomyopathy-patient's LV function is newly reduced. I have personally reviewed his echocardiogram. Etiology is unclear. Check TSH. He has no history of alcohol abuse. He has not had chest pain. I will arrange a [facility] nuclear study to screen for ischemia. If ischemia noted he would require cardiac catheterization. His blood pressure is elevated and he states it has been in the 140-150/90-100 range at home. Hypertension certainly could be contributing. Continue Toprol. He has an allergy to lisinopril and ramipril; I have reviewed this with him and he states he felt mildly short of breath with question lip swelling. It does not sound as though he had angioedema. Add losartan 50 mg daily. Check potassium and renal function in 1 week. Titrate medications as tolerated. Repeat echocardiogram 3 months later to see if LV function has improved.   2 Status post mitral valve replacement-continue Coumadin and aspirin 81 mg daily. Continue SBE prophylaxis. Valve function was normal on recent echocardiogram.  3 hyperlipidemia-continue statin.   4 hypertension-blood pressure controlled. Continue present medications.  5 preoperative evaluation prior to tooth extraction-patient will need to be off of Coumadin. He will need a Lovenox bridge. He will require SBE prophylaxis. Okay to proceed otherwise.  Kirk Ruths, MD

## 2016-07-20 ENCOUNTER — Encounter: Payer: Self-pay | Admitting: Cardiology

## 2016-07-20 ENCOUNTER — Telehealth: Payer: Self-pay | Admitting: Pharmacist

## 2016-07-20 ENCOUNTER — Ambulatory Visit (INDEPENDENT_AMBULATORY_CARE_PROVIDER_SITE_OTHER): Payer: Medicare Other | Admitting: Cardiology

## 2016-07-20 VITALS — BP 142/95 | HR 81 | Ht 76.0 in | Wt 196.6 lb

## 2016-07-20 DIAGNOSIS — R002 Palpitations: Secondary | ICD-10-CM | POA: Diagnosis not present

## 2016-07-20 DIAGNOSIS — I059 Rheumatic mitral valve disease, unspecified: Secondary | ICD-10-CM

## 2016-07-20 DIAGNOSIS — E7800 Pure hypercholesterolemia, unspecified: Secondary | ICD-10-CM | POA: Diagnosis not present

## 2016-07-20 DIAGNOSIS — I420 Dilated cardiomyopathy: Secondary | ICD-10-CM | POA: Diagnosis not present

## 2016-07-20 DIAGNOSIS — I10 Essential (primary) hypertension: Secondary | ICD-10-CM

## 2016-07-20 MED ORDER — LOSARTAN POTASSIUM 50 MG PO TABS: 50.0000 mg | ORAL_TABLET | Freq: Every day | ORAL | 3 refills | Status: DC

## 2016-07-20 NOTE — Patient Instructions (Signed)
Medication Instructions:   START LOSARTAN 50 MG ONCE DAILY  Labwork:  Your physician recommends that you return for lab work in: [place]  Testing/Procedures:  Your physician has requested that you have a lexiscan myoview. For further information please visit [email or web address]. Please follow instruction sheet, as given.    Follow-Up:  Your physician recommends that you schedule a follow-up appointment in: [place]

## 2016-07-20 NOTE — Telephone Encounter (Signed)
Received fax from Dr Galvin Proffer that pt requires 4 dental extractions with request to hold warfarin. Pt takes Coumadin for mechanical mitral valve, will need Lovenox bridge. Coumadin clinic to coordinate. Clearance routed to Dr Rex Kras [phone].

## 2016-07-25 ENCOUNTER — Telehealth (HOSPITAL_COMMUNITY): Payer: Self-pay

## 2016-07-25 NOTE — Telephone Encounter (Signed)
Encounter complete. 

## 2016-07-27 ENCOUNTER — Ambulatory Visit (HOSPITAL_COMMUNITY)
Admission: RE | Admit: 2016-07-27 | Discharge: 2016-07-27 | Disposition: A | Discharge status: Home / Self Care | Payer: Medicare Other | Source: Ambulatory Visit | Attending: Cardiology | Admitting: Cardiology

## 2016-07-27 DIAGNOSIS — I420 Dilated cardiomyopathy: Secondary | ICD-10-CM

## 2016-07-27 DIAGNOSIS — R002 Palpitations: Secondary | ICD-10-CM | POA: Diagnosis not present

## 2016-07-27 LAB — MYOCARDIAL PERFUSION IMAGING
CHL CUP NUCLEAR SSS: 5
CHL CUP RESTING HR STRESS: 67 {beats}/min
CSEPPHR: 82 {beats}/min
LV sys vol: 93 mL
LVDIAVOL: 150 mL (ref 62–150)
SDS: 4
SRS: 1
TID: 1.03

## 2016-07-27 LAB — BASIC METABOLIC PANEL
BUN: 14 mg/dL (ref 7–25)
CO2: 28 mmol/L (ref 20–31)
Calcium: 9.4 mg/dL (ref 8.6–10.3)
Chloride: 99 mmol/L (ref 98–110)
Creat: 1.01 mg/dL (ref 0.70–1.25)
Glucose, Bld: 101 mg/dL — ABNORMAL HIGH (ref 65–99)
Potassium: 3.9 mmol/L (ref 3.5–5.3)
Sodium: 137 mmol/L (ref 135–146)

## 2016-07-27 MED ORDER — TECHNETIUM TC 99M TETROFOSMIN IV KIT
30.2000 | PACK | Freq: Once | INTRAVENOUS | Status: AC | PRN
  Administered 2016-07-27: 30.2 via INTRAVENOUS
  Filled 2016-07-27: qty 31

## 2016-07-27 MED ORDER — TECHNETIUM TC 99M TETROFOSMIN IV KIT
10.9000 | PACK | Freq: Once | INTRAVENOUS | Status: AC | PRN
  Administered 2016-07-27: 10.9 via INTRAVENOUS
  Filled 2016-07-27: qty 11

## 2016-07-27 MED ORDER — REGADENOSON 0.4 MG/5ML IV SOLN
0.4000 mg | Freq: Once | INTRAVENOUS | Status: AC
  Administered 2016-07-27: 0.4 mg via INTRAVENOUS

## 2016-07-27 NOTE — Telephone Encounter (Signed)
Encounter complete. 

## 2016-07-28 LAB — TSH: TSH: 1.14 m[IU]/L (ref 0.40–4.50)

## 2016-08-03 ENCOUNTER — Ambulatory Visit (INDEPENDENT_AMBULATORY_CARE_PROVIDER_SITE_OTHER): Payer: Medicare Other | Admitting: *Deleted

## 2016-08-03 DIAGNOSIS — Z7901 Long term (current) use of anticoagulants: Secondary | ICD-10-CM

## 2016-08-03 DIAGNOSIS — I059 Rheumatic mitral valve disease, unspecified: Secondary | ICD-10-CM

## 2016-08-03 DIAGNOSIS — Z9889 Other specified postprocedural states: Secondary | ICD-10-CM | POA: Diagnosis not present

## 2016-08-03 LAB — POCT INR: INR: 3.1

## 2016-08-14 ENCOUNTER — Other Ambulatory Visit: Payer: Self-pay | Admitting: Family Medicine

## 2016-08-30 ENCOUNTER — Other Ambulatory Visit: Payer: Self-pay | Admitting: Family Medicine

## 2016-08-31 ENCOUNTER — Ambulatory Visit (INDEPENDENT_AMBULATORY_CARE_PROVIDER_SITE_OTHER): Payer: Medicare Other | Admitting: *Deleted

## 2016-08-31 DIAGNOSIS — Z7901 Long term (current) use of anticoagulants: Secondary | ICD-10-CM

## 2016-08-31 DIAGNOSIS — I059 Rheumatic mitral valve disease, unspecified: Secondary | ICD-10-CM

## 2016-08-31 DIAGNOSIS — Z9889 Other specified postprocedural states: Secondary | ICD-10-CM

## 2016-08-31 LAB — POCT INR: INR: 5.4

## 2016-09-05 ENCOUNTER — Ambulatory Visit (INDEPENDENT_AMBULATORY_CARE_PROVIDER_SITE_OTHER): Payer: Medicare Other | Admitting: Family Medicine

## 2016-09-05 ENCOUNTER — Encounter: Payer: Self-pay | Admitting: Family Medicine

## 2016-09-05 VITALS — BP 136/81 | HR 88 | Resp 16 | Wt 199.5 lb

## 2016-09-05 DIAGNOSIS — Z0000 Encounter for general adult medical examination without abnormal findings: Secondary | ICD-10-CM

## 2016-09-05 DIAGNOSIS — Z125 Encounter for screening for malignant neoplasm of prostate: Secondary | ICD-10-CM | POA: Diagnosis not present

## 2016-09-05 DIAGNOSIS — R7301 Impaired fasting glucose: Secondary | ICD-10-CM

## 2016-09-05 LAB — POCT GLYCOSYLATED HEMOGLOBIN (HGB A1C): HEMOGLOBIN A1C: 5.7

## 2016-09-05 MED ORDER — ALPRAZOLAM 0.5 MG PO TABS: 0.5000 mg | ORAL_TABLET | Freq: Every day | ORAL | 0 refills | Status: DC | PRN

## 2016-09-05 MED ORDER — ATORVASTATIN CALCIUM 20 MG PO TABS: 20.0000 mg | ORAL_TABLET | Freq: Every day | ORAL | 3 refills | Status: DC

## 2016-09-05 MED ORDER — METOPROLOL SUCCINATE ER 100 MG PO TB24: ORAL_TABLET | ORAL | 3 refills | Status: DC

## 2016-09-05 MED ORDER — ALBUTEROL SULFATE HFA 108 (90 BASE) MCG/ACT IN AERS: 2.0000 | INHALATION_SPRAY | Freq: Four times a day (QID) | RESPIRATORY_TRACT | 3 refills | Status: DC | PRN

## 2016-09-05 NOTE — Patient Instructions (Addendum)
Please call your insurance when you get a chance to see if they have something on your formulary in the place of her Symbicort. Or if you have a booklet that has her formulary and it then you can bring it by and we can take a look at possible alternatives that might be cheaper.  Keep up a regular exercise program and make sure you are eating a healthy diet Try to eat 4 servings of dairy a day, or if you are lactose intolerant take a calcium with vitamin D daily.  Your vaccines are up to date.

## 2016-09-05 NOTE — Progress Notes (Signed)
HPI: FU mitral valve replacement with a St. Jude valve in 1998. A pre-operative cardiac catheterization revealed normal coronary arteries. Echocardiogram in Oct 2014 showed normal LV function. The left atrium was mildly dilated. 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 Abdominal ultrasound November 2015 showed no aneurysm. Echocardiogram repeated May 2018 and showed newly reduced LV function with ejection fraction 35%, mild left ventricular hypertrophy, normally functioning mitral valve, mild left atrial enlargement, mild right ventricular enlargement. Nuclear study May 2018 showed ejection fraction 38%. There was inferior thinning versus infarct but no ischemia. Since I last saw him, the patient denies any dyspnea on exertion, orthopnea, PND, pedal edema, palpitations, syncope or chest pain.   Current Outpatient Prescriptions  Medication Sig Dispense Refill  . albuterol (PROAIR HFA) 108 (90 Base) MCG/ACT inhaler Inhale 2 puffs into the lungs every 6 (six) hours as needed for wheezing. 1 Inhaler 3  . ALPRAZolam (XANAX) 0.5 MG tablet Take 1 tablet (0.5 mg total) by mouth daily as needed for anxiety. 10 tablet 0  . amLODipine (NORVASC) 5 MG tablet TAKE 1 TABLET BY MOUTH ONCE DAILY 90 tablet 3  . aspirin 81 MG EC tablet Take 81 mg by mouth daily.     Marland Kitchen atorvastatin (LIPITOR) 20 MG tablet Take 1 tablet (20 mg total) by mouth daily. 90 tablet 3  . CALCIUM-VITAMIN D PO Take 1 tablet by mouth daily.    . hydrochlorothiazide (HYDRODIURIL) 25 MG tablet TAKE ONE TABLET BY MOUTH ONCE DAILY 60 tablet 5  . ibuprofen (ADVIL,MOTRIN) 200 MG tablet Take 400 mg by mouth every 6 (six) hours as needed for headache or mild pain.     Marland Kitchen losartan (COZAAR) 50 MG tablet Take 1 tablet (50 mg total) by mouth daily. 90 tablet 3  . metoprolol succinate (TOPROL-XL) 100 MG 24 hr tablet TAKE 1 TABLET BY MOUTH ONCE DAILY 90 tablet 3  . Multiple Vitamin (MULTIVITAMIN) tablet Take 1 tablet by mouth daily.    . SYMBICORT  160-4.5 MCG/ACT inhaler INHALE ONE PUFF BY MOUTH TWICE DAILY 1 Inhaler 2  . warfarin (COUMADIN) 10 MG tablet Take as directed by [facility] 30 tablet 1   No current facility-administered medications for this visit.      Past Medical History:  Diagnosis Date  . Allergy   . Allergy history unknown   . Anxiety   . Asthma   . CHF (congestive heart failure) ([place])   . Dyslipidemia   . HTN (hypertension)   . Labyrinthitis    hx  . Mitral valve replaced    hx    Past Surgical History:  Procedure Laterality Date  . ACNE CYST REMOVAL     from hand and back  . Jaw surgery (other)    . NASAL SINUS SURGERY N/A 10/16/2012   Procedure: NASAL ENDOSCOPIC POLYPECTOMY/MAXILLARY ANTROSTOMY/ETHMOIDECTOMY;  Surgeon: Izora Gala, MD;  Location: [facility];  Service: ENT;  Laterality: N/A;  . TOOTH EXTRACTION    . VALVE REPLACEMENT  1998   St. Jude, mitral    Social History   Social History  . Marital status: Married    Spouse name: Frenchie   . Number of children: 2  . 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 Smoking status: Former Smoker    Quit date: 04/26/1991  . Smokeless tobacco: Never Used  . Alcohol use No  . Drug use: No  . Sexual  activity: Not on file   Other Topics Concern  . Not on file   Social History Narrative   Some exercise, bike and walking.  2 cups per day.     Family History  Problem Relation Age of Onset  . Heart attack Father 62  . Colon cancer Mother 27  . Hypertension Mother     ROS: no fevers or chills, productive cough, hemoptysis, dysphasia, odynophagia, melena, hematochezia, dysuria, hematuria, rash, seizure activity, orthopnea, PND, pedal edema, claudication. Remaining systems are negative.  Physical Exam: Well-developed well-nourished in no acute distress.  Skin is warm and dry.  HEENT is normal.  Neck is supple.  Chest is clear to auscultation with normal expansion.    Cardiovascular exam is regular rate and rhythm. Crisp mechanical valve sounds Abdominal exam nontender or distended. No masses palpated. Extremities show no edema. neuro grossly intact   A/P  1 Cardiomyopathy-as outlined in previous notes patient's LV function is newly reduced. Recent TSH was normal. No history of alcohol abuse. No ischemia on nuclear study. Question if blood pressure was contributing. I will discontinue amlodipine. Increase Cozaar to 100 mg daily. Check potassium and renal function in 1 week. I will likely increase his Toprol at next office visit. We will ultimately repeat echocardiogram once medications have been titrated. If ejection fraction less than 35% would need ICD.  2 status post mitral valve replacement-continue SBE prophylaxis. Continue Coumadin and aspirin.  3 hyperlipidemia-continue statin.  4 hypertension-blood pressure is controlled. Continue present medications.   Kirk Ruths, MD

## 2016-09-05 NOTE — Progress Notes (Addendum)
Subjective:   Corey Mathis is a 66 y.o. male who presents for Medicare Annual/Subsequent preventive examination.  Hypertension- Pt denies chest pain, SOB, dizziness, or heart palpitations.  Taking meds as directed w/o problems.  Denies medication side effects.  Home BPs running in the 120-130s/80-90s.  He was recently started on losartan about 4 weeks ago by his cardiologist. He has really been trying hard to cut salt out of his diet.  He is also planned a trip to [place] and would like the alprazolam refilled. He gets very claustrophobic on airplanes.  Review of Systems:  Comprehensive review of systems is negative.       Objective:    Vitals: BP 136/81   Pulse 88   Resp 16   Wt 199 lb 8 oz (90.5 kg)   BMI 24.28 kg/m   Body mass index is 24.28 kg/m.   Physical Exam  Constitutional: He is oriented to person, place, and time. He appears well-developed and well-nourished.  HENT:  Head: Normocephalic and atraumatic.  Right Ear: External ear normal.  Left Ear: External ear normal.  Nose: Nose normal.  Mouth/Throat: Oropharynx is clear and moist.  Eyes: Conjunctivae and EOM are normal. Pupils are equal, round, and reactive to light.  Neck: Normal range of motion. Neck supple. No thyromegaly present.  Cardiovascular: Normal rate, regular rhythm, normal heart sounds and intact distal pulses.   Pulmonary/Chest: Effort normal and breath sounds normal.  Abdominal: Soft. Bowel sounds are normal. He exhibits no distension and no mass. There is no tenderness. There is no rebound and no guarding.  Musculoskeletal: Normal range of motion.  Lymphadenopathy:    He has no cervical adenopathy.  Neurological: He is alert and oriented to person, place, and time. He has normal reflexes.  Skin: Skin is warm and dry.  Psychiatric: He has a normal mood and affect. His behavior is normal. Judgment and thought content normal.     Tobacco History  Smoking Status  . Former Smoker  . Quit  date: 04/26/1991  Smokeless Tobacco  . Never Used     Counseling given: Not Answered   Past Medical History:  Diagnosis Date  . Allergy   . Allergy history unknown   . Anxiety   . Asthma   . CHF (congestive heart failure) ([place])   . Dyslipidemia   . HTN (hypertension)   . Labyrinthitis    hx  . Mitral valve replaced    hx   Past Surgical History:  Procedure Laterality Date  . ACNE CYST REMOVAL     from hand and back  . Jaw surgery (other)    . NASAL SINUS SURGERY N/A 10/16/2012   Procedure: NASAL ENDOSCOPIC POLYPECTOMY/MAXILLARY ANTROSTOMY/ETHMOIDECTOMY;  Surgeon: Izora Gala, MD;  Location: [facility];  Service: ENT;  Laterality: N/A;  . TOOTH EXTRACTION    . VALVE REPLACEMENT  1998   St. Jude, mitral   Family History  Problem Relation Age of Onset  . Heart attack Father 54  . Colon cancer Mother 60  . Hypertension Mother    History  Sexual Activity  . Sexual activity: Not on file    Outpatient Encounter Prescriptions as of 09/05/2016  Medication Sig  . albuterol (PROAIR HFA) 108 (90 Base) MCG/ACT inhaler Inhale 2 puffs into the lungs every 6 (six) hours as needed for wheezing.  Marland Kitchen ALPRAZolam (XANAX) 0.5 MG tablet Take 1 tablet (0.5 mg total) by mouth daily as needed for anxiety.  Marland Kitchen amLODipine (NORVASC) 5 MG  tablet TAKE 1 TABLET BY MOUTH ONCE DAILY  . aspirin 81 MG EC tablet Take 81 mg by mouth daily.   Marland Kitchen atorvastatin (LIPITOR) 20 MG tablet Take 1 tablet (20 mg total) by mouth daily.  Marland Kitchen CALCIUM-VITAMIN D PO Take 1 tablet by mouth daily.  . hydrochlorothiazide (HYDRODIURIL) 25 MG tablet TAKE ONE TABLET BY MOUTH ONCE DAILY  . ibuprofen (ADVIL,MOTRIN) 200 MG tablet Take 400 mg by mouth every 6 (six) hours as needed for headache or mild pain.   Marland Kitchen losartan (COZAAR) 50 MG tablet Take 1 tablet (50 mg total) by mouth daily.  . metoprolol succinate (TOPROL-XL) 100 MG 24 hr tablet TAKE 1 TABLET BY MOUTH ONCE DAILY  . Multiple Vitamin (MULTIVITAMIN) tablet Take 1 tablet by mouth  daily.  . SYMBICORT 160-4.5 MCG/ACT inhaler INHALE ONE PUFF BY MOUTH TWICE DAILY  . warfarin (COUMADIN) 10 MG tablet Take as directed by [facility]  . [DISCONTINUED] albuterol (PROAIR HFA) 108 (90 BASE) MCG/ACT inhaler Inhale 2 puffs into the lungs every 6 (six) hours as needed for wheezing.  . [DISCONTINUED] ALPRAZolam (XANAX) 0.5 MG tablet Take 1 tablet (0.5 mg total) by mouth daily as needed for anxiety.  . [DISCONTINUED] atorvastatin (LIPITOR) 20 MG tablet TAKE ONE TABLET BY MOUTH ONCE DAILY  . [DISCONTINUED] metoprolol succinate (TOPROL-XL) 100 MG 24 hr tablet TAKE 1 TABLET BY MOUTH ONCE DAILY **DUE FOR FOLLOW UP**   No facility-administered encounter medications on file as of 09/05/2016.     Activities of Daily Living No flowsheet data found.  Patient Care Team: [name], MD as PCP - General (Family Medicine)   Assessment:     Exercise Activities and Dietary recommendations Current Exercise Habits: Home exercise routine, Intensity: Mild  Goals    None     Fall Risk Fall Risk  09/05/2016 11/01/2015 05/26/2014 09/23/2013  Falls in the past year? No No No No   Depression Screen PHQ 2/9 Scores 09/05/2016 05/26/2014 09/23/2013  PHQ - 2 Score 0 1 0    Cognitive Function     6CIT Screen 09/05/2016  What Year? 0 points  What month? 0 points  What time? 0 points  Count back from 20 0 points  Months in reverse 0 points  Repeat phrase 2 points  Total Score 2    Immunization History  Administered Date(s) Administered  . Influenza Split 02/10/2011  . Influenza,inj,Quad PF,36+ Mos 01/13/2013, 01/26/2014, 01/12/2015  . Pneumococcal Conjugate-13 09/23/2013  . Pneumococcal Polysaccharide-23 03/13/2011  . Pneumococcal-Unspecified 05/10/2016  . Tdap 03/07/1999, 08/10/2015   Screening Tests Health Maintenance  Topic Date Due  . INFLUENZA VACCINE  10/04/2016  . COLONOSCOPY  02/23/2021  . TETANUS/TDAP  08/09/2025  . Hepatitis C Screening  Completed  . PNA vac Low  Risk Adult  Completed      Plan:  Medicare WEllness exam   I have personally reviewed and noted the following in the patient's chart:   . Medical and social history . Use of alcohol, tobacco or illicit drugs  - UTD.  . Current medications and supplements- UTD  . Functional ability and status - no concerns today.  . Nutritional status - good.  Marland Kitchen Physical activity - good . Advanced directives . List of other physicians . Hospitalizations, surgeries, and ER visits in previous 12 months . Vitals . Screenings to include cognitive, depression, and falls . Referrals and appointments - none today. Keep f/U with cardiology.    In addition, I have reviewed and discussed with  patient certain preventive protocols, quality metrics, and best practice recommendations. A written personalized care plan for preventive services as well as general preventive health recommendations were provided to patient.     [place], MD  09/05/2016

## 2016-09-14 ENCOUNTER — Ambulatory Visit (INDEPENDENT_AMBULATORY_CARE_PROVIDER_SITE_OTHER): Payer: Medicare Other | Admitting: *Deleted

## 2016-09-14 DIAGNOSIS — Z7901 Long term (current) use of anticoagulants: Secondary | ICD-10-CM

## 2016-09-14 DIAGNOSIS — I059 Rheumatic mitral valve disease, unspecified: Secondary | ICD-10-CM | POA: Diagnosis not present

## 2016-09-14 DIAGNOSIS — Z9889 Other specified postprocedural states: Secondary | ICD-10-CM | POA: Diagnosis not present

## 2016-09-14 LAB — POCT INR: INR: 3.4

## 2016-09-19 ENCOUNTER — Encounter: Payer: Self-pay | Admitting: Cardiology

## 2016-09-19 ENCOUNTER — Ambulatory Visit (INDEPENDENT_AMBULATORY_CARE_PROVIDER_SITE_OTHER): Payer: Medicare Other | Admitting: Cardiology

## 2016-09-19 VITALS — BP 128/84 | HR 76 | Ht 76.0 in | Wt 199.0 lb

## 2016-09-19 DIAGNOSIS — I428 Other cardiomyopathies: Secondary | ICD-10-CM

## 2016-09-19 DIAGNOSIS — E7800 Pure hypercholesterolemia, unspecified: Secondary | ICD-10-CM | POA: Diagnosis not present

## 2016-09-19 DIAGNOSIS — I10 Essential (primary) hypertension: Secondary | ICD-10-CM

## 2016-09-19 DIAGNOSIS — I059 Rheumatic mitral valve disease, unspecified: Secondary | ICD-10-CM

## 2016-09-19 MED ORDER — LOSARTAN POTASSIUM 100 MG PO TABS: 100.0000 mg | ORAL_TABLET | Freq: Every day | ORAL | 3 refills | Status: DC

## 2016-09-19 NOTE — Patient Instructions (Signed)
Medication Instructions:   STOP AMLODIPINE   INCREASE LOSARTAN TO 100 MG ONCE DAILY= 2 OF THE 50 MG TABLETS ONCE DAILY  Labwork:  Your physician recommends that you return for lab work in: [place]:  Your physician recommends that you schedule a follow-up appointment in: [place]   If you need a refill on your cardiac medications before your next appointment, please call your pharmacy.

## 2016-09-25 DIAGNOSIS — Z125 Encounter for screening for malignant neoplasm of prostate: Secondary | ICD-10-CM | POA: Diagnosis not present

## 2016-09-25 DIAGNOSIS — I428 Other cardiomyopathies: Secondary | ICD-10-CM | POA: Diagnosis not present

## 2016-09-26 ENCOUNTER — Other Ambulatory Visit: Payer: Self-pay | Admitting: Cardiology

## 2016-09-26 LAB — PSA: PSA: 1.3 ng/mL (ref <=4.0)

## 2016-09-26 LAB — BASIC METABOLIC PANEL
BUN/Creatinine Ratio: 10 (ref 10–24)
BUN: 11 mg/dL (ref 8–27)
CHLORIDE: 95 mmol/L — AB (ref 96–106)
CO2: 27 mmol/L (ref 20–29)
Calcium: 9.5 mg/dL (ref 8.6–10.2)
Creatinine, Ser: 1.15 mg/dL (ref 0.76–1.27)
GFR calc Af Amer: 75 mL/min/{1.73_m2} (ref >59)
GFR, EST NON AFRICAN AMERICAN: 65 mL/min/{1.73_m2} (ref >59)
GLUCOSE: 94 mg/dL (ref 65–99)
POTASSIUM: 4 mmol/L (ref 3.5–5.2)
SODIUM: 137 mmol/L (ref 134–144)

## 2016-09-26 NOTE — Progress Notes (Signed)
All labs are normal. 

## 2016-10-02 ENCOUNTER — Telehealth: Payer: Self-pay | Admitting: *Deleted

## 2016-10-02 NOTE — Telephone Encounter (Signed)
Left a message to call back for results, per DPR  Notes recorded by Lelon Perla, MD on 09/30/2016 at 6:12 PM EDT Continue present meds Kirk Ruths

## 2016-10-04 ENCOUNTER — Telehealth: Payer: Self-pay | Admitting: *Deleted

## 2016-10-04 NOTE — Telephone Encounter (Signed)
Patient has been notified of most recent lab results

## 2016-10-05 ENCOUNTER — Encounter: Payer: Self-pay | Admitting: *Deleted

## 2016-10-18 ENCOUNTER — Encounter: Payer: Self-pay | Admitting: Cardiology

## 2016-10-30 NOTE — Progress Notes (Signed)
HPI: FU mitral valve replacement with a St. Jude valve in 1998 and cardiomyopathy. Note pre-operative cardiac catheterization prior to MVR revealed normal coronary arteries. Echocardiogram in Oct 2014 showed normal LV function. The left atrium was mildly dilated. 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 Abdominal ultrasound November 2015 showed no aneurysm. Echocardiogram repeated May 2018 and showed newly reduced LV function with ejection fraction 35%, mild left ventricular hypertrophy, normally functioning mitral valve, mild left atrial enlargement, mild right ventricular enlargement. Nuclear study May 2018 showed ejection fraction 38%. There was inferior thinning versus infarct but no ischemia. Since I last saw him, the patient denies any dyspnea on exertion, orthopnea, PND, pedal edema, palpitations, syncope or chest pain.   Current Outpatient Prescriptions  Medication Sig Dispense Refill  . albuterol (PROAIR HFA) 108 (90 Base) MCG/ACT inhaler Inhale 2 puffs into the lungs every 6 (six) hours as needed for wheezing. 1 Inhaler 3  . ALPRAZolam (XANAX) 0.5 MG tablet Take 1 tablet (0.5 mg total) by mouth daily as needed for anxiety. 10 tablet 0  . aspirin 81 MG EC tablet Take 81 mg by mouth daily.     Marland Kitchen atorvastatin (LIPITOR) 20 MG tablet Take 1 tablet (20 mg total) by mouth daily. 90 tablet 3  . CALCIUM-VITAMIN D PO Take 1 tablet by mouth daily.    . Coenzyme Q10 (COQ10 PO) Chew one (1) gummy by mouth daily.    . hydrochlorothiazide (HYDRODIURIL) 25 MG tablet TAKE ONE TABLET BY MOUTH ONCE DAILY 60 tablet 5  . ibuprofen (ADVIL,MOTRIN) 200 MG tablet Take 400 mg by mouth every 6 (six) hours as needed for headache or mild pain.     Marland Kitchen losartan (COZAAR) 100 MG tablet Take 1 tablet (100 mg total) by mouth daily. 90 tablet 3  . metoprolol succinate (TOPROL-XL) 100 MG 24 hr tablet TAKE 1 TABLET BY MOUTH ONCE DAILY 90 tablet 3  . Multiple Vitamin (MULTIVITAMIN) tablet Take 1 tablet by mouth daily.      . Omega-3 Fatty Acids (FISH OIL PO) Take 1 capsule by mouth daily. (+ Folic Acid)    . SYMBICORT 160-4.5 MCG/ACT inhaler INHALE ONE PUFF BY MOUTH TWICE DAILY 1 Inhaler 2  . warfarin (COUMADIN) 10 MG tablet Take 1 tablet by mouth daily or as directed by coumadin clinic 30 tablet 2   No current facility-administered medications for this visit.      Past Medical History:  Diagnosis Date  . Allergy   . Allergy history unknown   . Anxiety   . Asthma   . CHF (congestive heart failure) ([place])   . Dyslipidemia   . HTN (hypertension)   . Labyrinthitis    hx  . Mitral valve replaced    hx    Past Surgical History:  Procedure Laterality Date  . ACNE CYST REMOVAL     from hand and back  . Jaw surgery (other)    . NASAL SINUS SURGERY N/A 10/16/2012   Procedure: NASAL ENDOSCOPIC POLYPECTOMY/MAXILLARY ANTROSTOMY/ETHMOIDECTOMY;  Surgeon: Izora Gala, MD;  Location: [place];  Service: ENT;  Laterality: N/A;  . TOOTH EXTRACTION    . VALVE REPLACEMENT  1998   St. Jude, mitral    Social History   Social History  . Marital status: Married    Spouse name: Frenchie   . Number of children: 2  . Years of education: N/A   Occupational History  . SALES [employer]   Social History Main Topics  .  Smoking status: Former Smoker    Quit date: 04/26/1991  . Smokeless tobacco: Never Used  . Alcohol use No  . Drug use: No  . Sexual activity: Not on file   Other Topics Concern  . Not on file   Social History Narrative   Some exercise, bike and walking.  2 cups per day.     Family History  Problem Relation Age of Onset  . Heart attack Father 72  . Colon cancer Mother 99  . Hypertension Mother     ROS: no fevers or chills, productive cough, hemoptysis, dysphasia, odynophagia, melena, hematochezia, dysuria, hematuria, rash, seizure activity, orthopnea, PND, pedal edema, claudication. Remaining systems are negative.  Physical Exam: Well-developed well-nourished in no acute distress.   Skin is warm and dry.  HEENT is normal.  Neck is supple.  Chest is clear to auscultation with normal expansion.  Cardiovascular exam is regular rate and rhythm. Crisp mechanical valve sound. 1/6 systolic murmur apex. Abdominal exam nontender or distended. No masses palpated. Extremities show no edema. neuro grossly intact  ECG- Sinus rhythm at a rate of 66, first-degree AV block. personally reviewed  A/P  1 Cardiomyopathy-previous echocardiogram showed newly reduced LV function. Etiology unclear. Nuclear study did not suggest ischemic heart disease. No history of alcohol abuse. TSH normal. We felt blood pressure may have contributed. We have then titrating medications. I will increase his Toprol to 150 mg daily. Continue cozaar. We will plan to repeat echocardiogram in 3 months to see if LV function has improved. If ejection fraction less than 35% may need to consider cardiac catheterization followed by ICD if no coronary artery disease demonstrated.  2 Status post mitral valve replacement-continue Coumadin and aspirin. Continue SBE prophylaxis.  3 hypertension-pressure is controlled. Increase Toprol for cardiomyopathy as outlined.  4 hyperlipidemia-continue statin.  Kirk Ruths, MD

## 2016-11-04 DIAGNOSIS — Z23 Encounter for immunization: Secondary | ICD-10-CM | POA: Diagnosis not present

## 2016-11-08 ENCOUNTER — Ambulatory Visit (INDEPENDENT_AMBULATORY_CARE_PROVIDER_SITE_OTHER): Payer: Medicare Other | Admitting: Cardiology

## 2016-11-08 ENCOUNTER — Encounter: Payer: Self-pay | Admitting: Cardiology

## 2016-11-08 VITALS — BP 144/88 | HR 66 | Ht 76.0 in | Wt 199.4 lb

## 2016-11-08 DIAGNOSIS — I059 Rheumatic mitral valve disease, unspecified: Secondary | ICD-10-CM | POA: Diagnosis not present

## 2016-11-08 DIAGNOSIS — I428 Other cardiomyopathies: Secondary | ICD-10-CM

## 2016-11-08 DIAGNOSIS — I10 Essential (primary) hypertension: Secondary | ICD-10-CM | POA: Diagnosis not present

## 2016-11-08 MED ORDER — METOPROLOL SUCCINATE ER 50 MG PO TB24: 50.0000 mg | ORAL_TABLET | Freq: Every day | ORAL | 3 refills | Status: DC

## 2016-11-08 NOTE — Patient Instructions (Signed)
Medication Instructions:   TAKE 50 MG OF M,ETOPROLOL ONCE DAILY WITH THE 100 MG METOPROLOL TO EQUAL 150 MG ONCE DAILY  Testing/Procedures:  Your physician has requested that you have an echocardiogram. Echocardiography is a painless test that uses sound waves to create images of your heart. It provides your doctor with information about the size and shape of your heart and how well your heart's chambers and valves are working. This procedure takes approximately one hour. There are no restrictions for this procedure.SCHEDULE IN 3 MONTHS    Follow-Up:  Your physician recommends that you schedule a follow-up appointment in: [place]   If you need a refill on your cardiac medications before your next appointment, please call your pharmacy.

## 2016-11-27 ENCOUNTER — Other Ambulatory Visit: Payer: Self-pay | Admitting: Family Medicine

## 2016-11-29 ENCOUNTER — Telehealth: Payer: Self-pay | Admitting: Cardiology

## 2016-11-29 ENCOUNTER — Other Ambulatory Visit: Payer: Self-pay | Admitting: *Deleted

## 2016-11-29 DIAGNOSIS — I428 Other cardiomyopathies: Secondary | ICD-10-CM

## 2016-11-29 MED ORDER — METOPROLOL SUCCINATE ER 100 MG PO TB24: ORAL_TABLET | ORAL | 3 refills | Status: DC

## 2016-11-29 MED ORDER — METOPROLOL SUCCINATE ER 50 MG PO TB24: 50.0000 mg | ORAL_TABLET | Freq: Every day | ORAL | 3 refills | Status: DC

## 2016-11-29 NOTE — Telephone Encounter (Signed)
°*  STAT* If patient is at the pharmacy, call can be transferred to refill team.   1. Which medications need to be refilled? (please list name of each medication and dose if known) Needs new  Prescription for Metoprolol 150 mg  2. Which pharmacy/location (including street and city if local pharmacy) is medication to be sent to?[facility] [phone] 3. Do they need a 30 day or 90 day supply? 90 and refills

## 2016-12-06 ENCOUNTER — Other Ambulatory Visit: Payer: Self-pay | Admitting: *Deleted

## 2016-12-06 ENCOUNTER — Other Ambulatory Visit: Payer: Self-pay

## 2016-12-06 MED ORDER — METOPROLOL SUCCINATE ER 100 MG PO TB24: ORAL_TABLET | ORAL | 3 refills | Status: DC

## 2016-12-06 MED ORDER — HYDROCHLOROTHIAZIDE 25 MG PO TABS: 25.0000 mg | ORAL_TABLET | Freq: Every day | ORAL | 2 refills | Status: DC

## 2016-12-06 MED ORDER — ALBUTEROL SULFATE HFA 108 (90 BASE) MCG/ACT IN AERS: 2.0000 | INHALATION_SPRAY | Freq: Four times a day (QID) | RESPIRATORY_TRACT | 2 refills | Status: DC | PRN

## 2016-12-06 MED ORDER — ATORVASTATIN CALCIUM 20 MG PO TABS: 20.0000 mg | ORAL_TABLET | Freq: Every day | ORAL | 3 refills | Status: DC

## 2016-12-06 MED ORDER — LOSARTAN POTASSIUM 100 MG PO TABS: 100.0000 mg | ORAL_TABLET | Freq: Every day | ORAL | 3 refills | Status: DC

## 2016-12-06 MED ORDER — BUDESONIDE-FORMOTEROL FUMARATE 160-4.5 MCG/ACT IN AERO: 1.0000 | INHALATION_SPRAY | Freq: Two times a day (BID) | RESPIRATORY_TRACT | 2 refills | Status: DC

## 2016-12-06 MED ORDER — WARFARIN SODIUM 10 MG PO TABS: ORAL_TABLET | ORAL | 2 refills | Status: DC

## 2016-12-16 ENCOUNTER — Encounter: Payer: Self-pay | Admitting: Emergency Medicine

## 2016-12-16 ENCOUNTER — Emergency Department (INDEPENDENT_AMBULATORY_CARE_PROVIDER_SITE_OTHER)
Admission: EM | Admit: 2016-12-16 | Discharge: 2016-12-16 | Disposition: A | Discharge status: Home / Self Care | Payer: Medicare Other | Source: Home / Self Care

## 2016-12-16 DIAGNOSIS — K047 Periapical abscess without sinus: Secondary | ICD-10-CM | POA: Diagnosis not present

## 2016-12-16 MED ORDER — AMOXICILLIN 500 MG PO CAPS: 500.0000 mg | ORAL_CAPSULE | Freq: Three times a day (TID) | ORAL | 0 refills | Status: DC

## 2016-12-16 NOTE — ED Provider Notes (Signed)
[facility]    CSN: [id number] Arrival date & time: 12/16/16  1051     History   Chief Complaint Chief Complaint  Patient presents with  . Dental Pain    HPI Corey Mathis is a 66 y.o. male.   The history is provided by the patient. No language interpreter was used.  Dental Pain  Location:  Upper Upper teeth location:  14/LU 1st molar and 13/LU 2nd bicuspid Quality:  Aching Severity:  Moderate Onset quality:  Gradual Timing:  Constant Context: poor dentition   Relieved by:  Nothing Worsened by:  Nothing Ineffective treatments:  None tried Associated symptoms: no fever   Risk factors: no diabetes   Pt has swelling around a broken tooth.  Pt has a st Jude valve.  Pt plans to see his dentist this week.    Past Medical History:  Diagnosis Date  . Allergy   . Allergy history unknown   . Anxiety   . Asthma   . CHF (congestive heart failure) ([place])   . Dyslipidemia   . HTN (hypertension)   . Labyrinthitis    hx  . Mitral valve replaced    hx    Patient Active Problem List   Diagnosis Date Noted  . Bruit 01/08/2014  . IFG (impaired fasting glucose) 09/23/2013  . Lung nodule 03/31/2013  . BPH (benign prostatic hyperplasia) 09/27/2012  . Sebaceous cyst 04/26/2011  . Lipoma of back 04/26/2011  . Long term (current) use of anticoagulants 01/25/2011  . Mitral valve disorder 04/14/2010  . WEIGHT LOSS 02/11/2010  . Hyperlipidemia 06/03/2007  . Anxiety state 06/03/2007  . Essential hypertension 06/03/2007  . ASTHMA 06/03/2007  . ALLERGY 06/03/2007  . LABYRINTHITIS, HX OF 06/03/2007  . MITRAL VALVE REPLACEMENT, HX OF 06/03/2007    Past Surgical History:  Procedure Laterality Date  . ACNE CYST REMOVAL     from hand and back  . Jaw surgery (other)    . NASAL SINUS SURGERY N/A 10/16/2012   Procedure: NASAL ENDOSCOPIC POLYPECTOMY/MAXILLARY ANTROSTOMY/ETHMOIDECTOMY;  Surgeon: Izora Gala, MD;  Location: [facility];  Service: ENT;  Laterality: N/A;  .  TOOTH EXTRACTION    . VALVE REPLACEMENT  1998   St. Jude, mitral       Home Medications    Prior to Admission medications   Medication Sig Start Date End Date Taking? Authorizing Provider  albuterol (PROAIR HFA) 108 (90 Base) MCG/ACT inhaler Inhale 2 puffs into the lungs every 6 (six) hours as needed for wheezing. 12/06/16 09/07/24  [name], MD  ALPRAZolam Duanne Moron) 0.5 MG tablet Take 1 tablet (0.5 mg total) by mouth daily as needed for anxiety. 09/05/16   [name], MD  amoxicillin (AMOXIL) 500 MG capsule Take 1 capsule (500 mg total) by mouth 3 (three) times daily. 12/16/16   Fransico Meadow, PA-C  aspirin 81 MG EC tablet Take 81 mg by mouth daily.     [provider]  atorvastatin (LIPITOR) 20 MG tablet Take 1 tablet (20 mg total) by mouth daily. 12/06/16   [name], MD  budesonide-formoterol (SYMBICORT) 160-4.5 MCG/ACT inhaler Inhale 1 puff into the lungs 2 (two) times daily. 12/06/16   [name], MD  CALCIUM-VITAMIN D PO Take 1 tablet by mouth daily.    [provider]  Coenzyme Q10 (COQ10 PO) Chew one (1) gummy by mouth daily.    [provider]  hydrochlorothiazide (HYDRODIURIL) 25 MG tablet Take 1 tablet (25 mg total)  by mouth daily. 12/06/16   [name], MD  ibuprofen (ADVIL,MOTRIN) 200 MG tablet Take 400 mg by mouth every 6 (six) hours as needed for headache or mild pain.     [provider]  losartan (COZAAR) 100 MG tablet Take 1 tablet (100 mg total) by mouth daily. 12/06/16 03/06/17  [name], MD  metoprolol succinate (TOPROL-XL) 100 MG 24 hr tablet TAKE 1 TABLET BY MOUTH ONCE DAILY 12/06/16   [name], MD  Multiple Vitamin (MULTIVITAMIN) tablet Take 1 tablet by mouth daily.    [provider]  Omega-3 Fatty Acids (FISH OIL PO) Take 1 capsule by mouth daily. (+ Folic Acid)    [provider]  warfarin (COUMADIN) 10 MG tablet Take 1 tablet by mouth  daily or as directed by coumadin clinic 12/06/16   [name], MD    Family History Family History  Problem Relation Age of Onset  . Heart attack Father 10  . Colon cancer Mother 20  . Hypertension Mother     Social History Social History  Substance Use Topics  . Smoking status: Former Smoker    Quit date: 04/26/1991  . Smokeless tobacco: Never Used  . Alcohol use No     Allergies   Lisinopril; Carvedilol; Codeine sulfate; Ezetimibe-simvastatin; Propranolol hcl; and Ramipril   Review of Systems Review of Systems  Constitutional: Negative for fever.  All other systems reviewed and are negative.    Physical Exam Triage Vital Signs ED Triage Vitals  Enc Vitals Group     BP 12/16/16 1119 (!) 162/98     Pulse Rate 12/16/16 1119 67     Resp --      Temp 12/16/16 1119 98.5 F (36.9 C)     Temp Source 12/16/16 1119 Oral     SpO2 12/16/16 1119 97 %     Weight 12/16/16 1119 201 lb 12 oz (91.5 kg)     Height 12/16/16 1119 6\' 4"  (1.93 m)     Head Circumference --      Peak Flow --      Pain Score 12/16/16 1120 7     Pain Loc --      Pain Edu? --      Excl. in [place]? --    No data found.   Updated Vital Signs BP (!) 162/98 (BP Location: Left Arm)   Pulse 67   Temp 98.5 F (36.9 C) (Oral)   Ht 6\' 4"  (1.93 m)   Wt 201 lb 12 oz (91.5 kg)   SpO2 97%   BMI 24.56 kg/m   Visual Acuity Right Eye Distance:   Left Eye Distance:   Bilateral Distance:    Right Eye Near:   Left Eye Near:    Bilateral Near:     Physical Exam  Constitutional: He appears well-developed and well-nourished.  HENT:  Head: Normocephalic and atraumatic.  Dental decay, swelling left upper gumline,  multiple cavities and broken teeth.  Eyes: Conjunctivae are normal.  Neck: Neck supple.  Cardiovascular: Normal rate and regular rhythm.   Murmur heard. Pulmonary/Chest: Effort normal. No respiratory distress.  Abdominal: There is no tenderness.  Musculoskeletal: He exhibits no  edema.  Neurological: He is alert.  Skin: Skin is warm and dry.  Psychiatric: He has a normal mood and affect.  Nursing note and vitals reviewed.    UC Treatments / Results  Labs (all labs ordered are listed, but only abnormal results are displayed) Labs Reviewed -  No data to display  EKG  EKG Interpretation None       Radiology No results found.  Procedures Procedures (including critical care time)  Medications Ordered in UC Medications - No data to display   Initial Impression / Assessment and Plan / UC Course  I have reviewed the triage vital signs and the nursing notes.  Pertinent labs & imaging results that were available during my care of the patient were reviewed by me and considered in my medical decision making (see chart for details).       Final Clinical Impressions(s) / UC Diagnoses   Final diagnoses:  Dental infection    New Prescriptions New Prescriptions   AMOXICILLIN (AMOXIL) 500 MG CAPSULE    Take 1 capsule (500 mg total) by mouth 3 (three) times daily.     Controlled Substance Prescriptions [place] Controlled Substance Registry consulted? Not Applicable   An After Visit Summary was printed and given to the patient.    Fransico Meadow, [place] 12/16/16 1138

## 2016-12-16 NOTE — ED Triage Notes (Signed)
Patient complaining of infected tooth on left side of mouth x 1 week, swelling in face and eye

## 2016-12-16 NOTE — Discharge Instructions (Signed)
Schedule to see your Dentist this week

## 2016-12-18 ENCOUNTER — Telehealth: Payer: Self-pay | Admitting: Emergency Medicine

## 2016-12-18 NOTE — Telephone Encounter (Signed)
Tooth is better seeing dentist on Wednesday.

## 2016-12-26 ENCOUNTER — Ambulatory Visit (INDEPENDENT_AMBULATORY_CARE_PROVIDER_SITE_OTHER): Payer: Medicare Other | Admitting: *Deleted

## 2016-12-26 DIAGNOSIS — I059 Rheumatic mitral valve disease, unspecified: Secondary | ICD-10-CM | POA: Diagnosis not present

## 2016-12-26 DIAGNOSIS — Z9889 Other specified postprocedural states: Secondary | ICD-10-CM

## 2016-12-26 DIAGNOSIS — Z7901 Long term (current) use of anticoagulants: Secondary | ICD-10-CM | POA: Diagnosis not present

## 2016-12-26 LAB — POCT INR: INR: 2.8

## 2017-01-03 ENCOUNTER — Ambulatory Visit (INDEPENDENT_AMBULATORY_CARE_PROVIDER_SITE_OTHER): Payer: Medicare Other | Admitting: *Deleted

## 2017-01-03 DIAGNOSIS — Z9889 Other specified postprocedural states: Secondary | ICD-10-CM | POA: Diagnosis not present

## 2017-01-03 DIAGNOSIS — Z7901 Long term (current) use of anticoagulants: Secondary | ICD-10-CM | POA: Diagnosis not present

## 2017-01-03 DIAGNOSIS — I059 Rheumatic mitral valve disease, unspecified: Secondary | ICD-10-CM | POA: Diagnosis not present

## 2017-01-03 LAB — POCT INR: INR: 2.1

## 2017-01-03 MED ORDER — ENOXAPARIN SODIUM 80 MG/0.8ML ~~LOC~~ SOLN: 80.0000 mg | Freq: Two times a day (BID) | SUBCUTANEOUS | 1 refills | Status: DC

## 2017-01-03 NOTE — Patient Instructions (Addendum)
01/03/17: Start Lovenox 80 mg in the fatty abdominal tissue at least 2 inches from the belly button twice a day about 12 hours apart, 8am and 8pm rotate sites. No Coumadin.  01/04/17:  Inject Lovenox in the fatty tissue every 12 hours, 8am and 8pm. No Coumadin.  01/05/17:  Inject Lovenox in the fatty tissue every 12 hours, 8am and 8pm. No Coumadin.  01/06/17: Inject Lovenox in the fatty tissue every 12 hours, 8am and 8pm. No Coumadin.  01/07/17: Inject Lovenox in the fatty tissue every 12 hours, 8am and 8pm. No Coumadin.  01/08/17: Inject Lovenox in the fatty tissue in the morning at 8 am (No PM dose). No Coumadin.  01/09/17: Procedure Day - No Lovenox.  Do not take any Coumadin today.   01/10/17: Resume Lovenox inject in the fatty tissue every 12 hours and take Coumadin.   01/11/17: Inject Lovenox in the fatty tissue every 12 hours and take Coumadin.  01/12/17: Inject Lovenox in the fatty tissue every 12 hours and take Coumadin. Take an extra 1/2 tablet of Coumadin today.  01/13/17: Inject Lovenox in the fatty tissue every 12 hours and take Coumadin. Take an extra 1/2 tablet of Coumadin today.  01/14/17: Inject Lovenox in the fatty tissue every 12 hours and take Coumadin.  01/15/17: Inject Lovenox in then fatty tissue every 12 hours and report to Coumadin appt to check INR.

## 2017-01-04 ENCOUNTER — Telehealth: Payer: Self-pay

## 2017-01-04 NOTE — Telephone Encounter (Signed)
Pt called states Lovenox is too expensive going to cost $400.  Pt called insurance states they do cover Enoxaparin, he is $190 away from next tier of coverage by insurance.  Pt states he ordered his Simvastatin which is $260, and he is going to postpone his dental surgery for 2 weeks, until he can sort through insurance and copay for enoxaparin injections is less.  Pt skipped last night's dosage of Coumadin as instructed, had Lovenox at home took 1 injection last night advised pt to resume Coumadin tonight take 15mg  today and tomorrow, then resume previous dosage regimen 10mg  daily.  Changed follow-up appt to 01/11/17, pt will notify us when dental surgery reschedule date is so that we can schedule him an appt to Lovenox bridge him at that time.

## 2017-01-08 ENCOUNTER — Other Ambulatory Visit: Payer: Self-pay | Admitting: Cardiology

## 2017-01-11 ENCOUNTER — Ambulatory Visit (INDEPENDENT_AMBULATORY_CARE_PROVIDER_SITE_OTHER): Payer: Medicare Other | Admitting: *Deleted

## 2017-01-11 DIAGNOSIS — I059 Rheumatic mitral valve disease, unspecified: Secondary | ICD-10-CM

## 2017-01-11 DIAGNOSIS — Z9889 Other specified postprocedural states: Secondary | ICD-10-CM | POA: Diagnosis not present

## 2017-01-11 DIAGNOSIS — Z7901 Long term (current) use of anticoagulants: Secondary | ICD-10-CM

## 2017-01-11 DIAGNOSIS — Z5181 Encounter for therapeutic drug level monitoring: Secondary | ICD-10-CM | POA: Diagnosis not present

## 2017-01-11 LAB — POCT INR: INR: 1.4

## 2017-01-17 ENCOUNTER — Other Ambulatory Visit: Payer: Self-pay | Admitting: Family Medicine

## 2017-01-18 ENCOUNTER — Ambulatory Visit (INDEPENDENT_AMBULATORY_CARE_PROVIDER_SITE_OTHER): Payer: Medicare Other

## 2017-01-18 DIAGNOSIS — I059 Rheumatic mitral valve disease, unspecified: Secondary | ICD-10-CM

## 2017-01-18 DIAGNOSIS — Z9889 Other specified postprocedural states: Secondary | ICD-10-CM | POA: Diagnosis not present

## 2017-01-18 DIAGNOSIS — Z7901 Long term (current) use of anticoagulants: Secondary | ICD-10-CM | POA: Diagnosis not present

## 2017-01-18 LAB — POCT INR: INR: 3.2

## 2017-01-18 NOTE — Patient Instructions (Signed)
Continue same dose 10 mg daily.  Recheck  INR in 2 weeks. Call once scheduled for the dental procedures # [phone]

## 2017-02-01 ENCOUNTER — Ambulatory Visit (INDEPENDENT_AMBULATORY_CARE_PROVIDER_SITE_OTHER): Payer: Medicare Other | Admitting: *Deleted

## 2017-02-01 ENCOUNTER — Telehealth: Payer: Self-pay | Admitting: Cardiology

## 2017-02-01 DIAGNOSIS — Z9889 Other specified postprocedural states: Secondary | ICD-10-CM | POA: Diagnosis not present

## 2017-02-01 DIAGNOSIS — I059 Rheumatic mitral valve disease, unspecified: Secondary | ICD-10-CM

## 2017-02-01 DIAGNOSIS — Z7901 Long term (current) use of anticoagulants: Secondary | ICD-10-CM

## 2017-02-01 LAB — POCT INR: INR: 6.6

## 2017-02-01 LAB — PROTIME-INR
INR: 6 (ref 0.8–1.2)
Prothrombin Time: 64 s — ABNORMAL HIGH (ref 9.1–12.0)

## 2017-02-01 NOTE — Telephone Encounter (Signed)
Pt called [facility]madin Cl[facility] after realizing his phone was on silent. Please refer to Anticoagulation Encounter for more details on the encounter.

## 2017-02-01 NOTE — Telephone Encounter (Signed)
Returned a call to the pt but goes straight to voicemail. Have called the pt multiple times & can be viewed in the open Anticoagulation Encounter & each time the phone goes straight to voicemail, let msgs with the pt to call Coumadin Clinic & not the main number. Left another msg for the pt to call back.

## 2017-02-01 NOTE — Telephone Encounter (Signed)
New message     Patient has questions about his coumadin appt today

## 2017-02-07 ENCOUNTER — Ambulatory Visit (HOSPITAL_COMMUNITY): Payer: Medicare Other | Attending: Cardiology

## 2017-02-07 ENCOUNTER — Other Ambulatory Visit: Payer: Self-pay

## 2017-02-07 DIAGNOSIS — J45909 Unspecified asthma, uncomplicated: Secondary | ICD-10-CM | POA: Insufficient documentation

## 2017-02-07 DIAGNOSIS — I428 Other cardiomyopathies: Secondary | ICD-10-CM | POA: Diagnosis not present

## 2017-02-07 DIAGNOSIS — I110 Hypertensive heart disease with heart failure: Secondary | ICD-10-CM | POA: Insufficient documentation

## 2017-02-07 DIAGNOSIS — Z952 Presence of prosthetic heart valve: Secondary | ICD-10-CM | POA: Diagnosis not present

## 2017-02-07 DIAGNOSIS — F419 Anxiety disorder, unspecified: Secondary | ICD-10-CM | POA: Insufficient documentation

## 2017-02-07 DIAGNOSIS — I509 Heart failure, unspecified: Secondary | ICD-10-CM | POA: Diagnosis not present

## 2017-03-07 ENCOUNTER — Other Ambulatory Visit: Payer: Self-pay | Admitting: Family Medicine

## 2017-03-28 ENCOUNTER — Ambulatory Visit (INDEPENDENT_AMBULATORY_CARE_PROVIDER_SITE_OTHER): Payer: Medicare Other | Admitting: *Deleted

## 2017-03-28 DIAGNOSIS — Z9889 Other specified postprocedural states: Secondary | ICD-10-CM

## 2017-03-28 DIAGNOSIS — I059 Rheumatic mitral valve disease, unspecified: Secondary | ICD-10-CM

## 2017-03-28 DIAGNOSIS — Z7901 Long term (current) use of anticoagulants: Secondary | ICD-10-CM

## 2017-03-28 LAB — POCT INR: INR: 4.4

## 2017-03-28 NOTE — Patient Instructions (Signed)
Description   Skip today's dose,  then resume taking 1 tablet (10mg ) daily. Resume normal dark green leafy intake & remain consistent.  Recheck INR in 8 days.. Report to ER with any bleeding and falls. Call once scheduled for the dental procedures # [phone]

## 2017-04-05 ENCOUNTER — Ambulatory Visit (INDEPENDENT_AMBULATORY_CARE_PROVIDER_SITE_OTHER): Payer: Medicare Other | Admitting: *Deleted

## 2017-04-05 DIAGNOSIS — Z9889 Other specified postprocedural states: Secondary | ICD-10-CM

## 2017-04-05 DIAGNOSIS — Z7901 Long term (current) use of anticoagulants: Secondary | ICD-10-CM

## 2017-04-05 DIAGNOSIS — I059 Rheumatic mitral valve disease, unspecified: Secondary | ICD-10-CM | POA: Diagnosis not present

## 2017-04-05 LAB — POCT INR: INR: 2.2

## 2017-04-05 NOTE — Patient Instructions (Signed)
Description   Today take 1.5 tablets,  then resume taking 1 tablet (10mg ) daily. Resume normal dark green leafy intake & remain consistent.  Recheck INR in 10 days.  Report to ER with any bleeding and falls. Call once scheduled for the dental procedures # [phone]

## 2017-04-16 ENCOUNTER — Ambulatory Visit (INDEPENDENT_AMBULATORY_CARE_PROVIDER_SITE_OTHER): Payer: Medicare Other

## 2017-04-16 DIAGNOSIS — Z9889 Other specified postprocedural states: Secondary | ICD-10-CM | POA: Diagnosis not present

## 2017-04-16 DIAGNOSIS — Z7901 Long term (current) use of anticoagulants: Secondary | ICD-10-CM

## 2017-04-16 DIAGNOSIS — I059 Rheumatic mitral valve disease, unspecified: Secondary | ICD-10-CM

## 2017-04-16 LAB — POCT INR: INR: 2.5

## 2017-04-30 ENCOUNTER — Other Ambulatory Visit: Payer: Self-pay | Admitting: Cardiology

## 2017-05-01 ENCOUNTER — Ambulatory Visit (INDEPENDENT_AMBULATORY_CARE_PROVIDER_SITE_OTHER): Payer: Medicare Other | Admitting: *Deleted

## 2017-05-01 DIAGNOSIS — Z7901 Long term (current) use of anticoagulants: Secondary | ICD-10-CM

## 2017-05-01 DIAGNOSIS — Z9889 Other specified postprocedural states: Secondary | ICD-10-CM | POA: Diagnosis not present

## 2017-05-01 DIAGNOSIS — I059 Rheumatic mitral valve disease, unspecified: Secondary | ICD-10-CM | POA: Diagnosis not present

## 2017-05-01 LAB — POCT INR: INR: 3

## 2017-05-01 MED ORDER — WARFARIN SODIUM 10 MG PO TABS: ORAL_TABLET | ORAL | 2 refills | Status: DC

## 2017-05-01 NOTE — Patient Instructions (Signed)
Description   Since you only took 1/2 tablet last night take 1.5 tablets today then continue on same dosage 1 tablet (10mg ) daily.  Recheck INR in 3 weeks. Call once scheduled for the dental procedures # [phone]

## 2017-05-22 ENCOUNTER — Ambulatory Visit (INDEPENDENT_AMBULATORY_CARE_PROVIDER_SITE_OTHER): Payer: Medicare Other | Admitting: *Deleted

## 2017-05-22 DIAGNOSIS — Z9889 Other specified postprocedural states: Secondary | ICD-10-CM

## 2017-05-22 DIAGNOSIS — I059 Rheumatic mitral valve disease, unspecified: Secondary | ICD-10-CM

## 2017-05-22 DIAGNOSIS — Z7901 Long term (current) use of anticoagulants: Secondary | ICD-10-CM | POA: Diagnosis not present

## 2017-05-22 LAB — POCT INR: INR: 4.5

## 2017-05-22 NOTE — Patient Instructions (Signed)
Description   Skip today's dose, then  continue on same dosage 1 tablet (10mg ) daily.  Recheck INR in 2 weeks. Call once scheduled for the dental procedures # [phone]

## 2017-06-05 ENCOUNTER — Ambulatory Visit (INDEPENDENT_AMBULATORY_CARE_PROVIDER_SITE_OTHER): Payer: Medicare Other | Admitting: *Deleted

## 2017-06-05 DIAGNOSIS — Z7901 Long term (current) use of anticoagulants: Secondary | ICD-10-CM

## 2017-06-05 DIAGNOSIS — Z9889 Other specified postprocedural states: Secondary | ICD-10-CM

## 2017-06-05 DIAGNOSIS — I059 Rheumatic mitral valve disease, unspecified: Secondary | ICD-10-CM | POA: Diagnosis not present

## 2017-06-05 LAB — POCT INR: INR: 2.8

## 2017-06-15 ENCOUNTER — Encounter: Payer: Self-pay | Admitting: Family Medicine

## 2017-06-15 ENCOUNTER — Ambulatory Visit (INDEPENDENT_AMBULATORY_CARE_PROVIDER_SITE_OTHER): Payer: Medicare Other | Admitting: Family Medicine

## 2017-06-15 ENCOUNTER — Telehealth: Payer: Self-pay | Admitting: Family Medicine

## 2017-06-15 VITALS — BP 135/76 | HR 70 | Ht 76.0 in | Wt 204.0 lb

## 2017-06-15 DIAGNOSIS — I10 Essential (primary) hypertension: Secondary | ICD-10-CM

## 2017-06-15 DIAGNOSIS — R7301 Impaired fasting glucose: Secondary | ICD-10-CM | POA: Diagnosis not present

## 2017-06-15 DIAGNOSIS — H539 Unspecified visual disturbance: Secondary | ICD-10-CM | POA: Diagnosis not present

## 2017-06-15 DIAGNOSIS — K2100 Gastro-esophageal reflux disease with esophagitis, without bleeding: Secondary | ICD-10-CM

## 2017-06-15 DIAGNOSIS — J4540 Moderate persistent asthma, uncomplicated: Secondary | ICD-10-CM

## 2017-06-15 LAB — POCT GLYCOSYLATED HEMOGLOBIN (HGB A1C): Hemoglobin A1C: 5.6

## 2017-06-15 NOTE — Progress Notes (Addendum)
Subjective:    CC: BP, IFG  HPI:  Hypertension- Pt denies chest pain, SOB, dizziness, or heart palpitations.  Taking meds as directed w/o problems.  Denies medication side effects.  Does occasionally notice some heartburn he which she says is almost a stinging or burning on the left side of his chest.  He says it happens more in the evenings.  He will sometimes take an over-the-counter Dramamine to help himself sleep.  He really has not tried using any antacid etc.  Impaired fasting glucose-no increased thirst or urination. No symptoms consistent with hypoglycemia.  Asthma -is doing well overall.  He uses his Symbicort twice a day.  He still using his albuterol about twice a week so does not really want to come down or off of his Symbicort at this point in time.  With his new insurance the price came down to about $60.  Reports that he has noticed a slight change in his vision.  He says is been years since he had an eye exam.  No pain.  He just notices he is not able to read quite as long before he feels like his eyes get tired.  Past medical history, Surgical history, Family history not pertinant except as noted below, Social history, Allergies, and medications have been entered into the medical record, reviewed, and corrections made.   Review of Systems: No fevers, chills, night sweats, weight loss, chest pain, or shortness of breath.   Objective:    General: Well Developed, well nourished, and in no acute distress.  Neuro: Alert and oriented x3, extra-ocular muscles intact, sensation grossly intact.  HEENT: Normocephalic, atraumatic  Skin: Warm and dry, no rashes. Cardiac: Regular rate and rhythm, no murmurs rubs or gallops, no lower extremity edema.  Respiratory: Clear to auscultation bilaterally. Not using accessory muscles, speaking in full sentences.   Impression and Recommendations:    HTN - Well controlled. Continue current regimen. Follow up in 6 months.    IFG -controlled.   Hemoglobin A1c of 5.6 which looks fantastic today.  Asthma, moderate persistant -continue with Symbicort.  Encouraged him to check with his insurance to see if anything is cheaper since Advair now went generic.  I be happy to change it if he finds that it will save him some money.  Otherwise we will continue with current regimen.  Continue albuterol as needed and call if symptoms increase or persist.  Change in vision-encouraged him to schedule an eye exam.  Reflux-recommend a trial of Tums or Maalox and if he needs something more frequently than he can use Zantac.

## 2017-06-15 NOTE — Telephone Encounter (Signed)
Trelon states he is not feeling depression. He feels more sluggish in the morning and after some coffee he feels better.

## 2017-06-15 NOTE — Telephone Encounter (Signed)
Call pt: he did note some mild depression sxs during his OV.  Would he be insterested in coming in and talking more about it or in seeing a  Counselor?

## 2017-06-26 NOTE — Progress Notes (Signed)
HPI: FU mitral valve replacement with a St. Jude valve in 1998 and cardiomyopathy. Note pre-operative cardiac catheterization prior to MVR revealed normal coronary arteries. Echocardiogram in Oct 2014 showed normal LV function. The left atrium was mildly dilated. 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 Abdominal ultrasound November 2015 showed no aneurysm. Echocardiogram repeated May 2018 and showed newly reduced LV function with ejection fraction 35%, mild left ventricular hypertrophy, normally functioning mitral valve, mild left atrial enlargement, mild right ventricular enlargement. Nuclear study May 2018 showed ejection fraction 38%. There was inferior thinning versus infarct but no ischemia.  Echocardiogram December 2018 showed ejection fraction 35-40% with diffuse hypokinesis.  The ascending aorta measured 43 mm.  There was a mechanical valve with mean gradient 3 mmHg.  Mild biatrial enlargement.  Since I last saw him,  he notes mild increased dyspnea on exertion.  No orthopnea, PND or pedal edema.  He describes some chest discomfort and left arm numbness when he initially begins walking resolves with continued activity.  No symptoms at rest.   Current Outpatient Medications  Medication Sig Dispense Refill  . albuterol (PROAIR HFA) 108 (90 Base) MCG/ACT inhaler Inhale 2 puffs into the lungs every 6 (six) hours as needed for wheezing. 3 Inhaler 2  . ALPRAZolam (XANAX) 0.5 MG tablet TAKE 1 TABLET BY MOUTH ONCE DAILY AS NEEDED FOR ANXIETY 10 tablet 0  . aspirin 81 MG EC tablet Take 81 mg by mouth daily.     Marland Kitchen atorvastatin (LIPITOR) 20 MG tablet Take 1 tablet (20 mg total) by mouth daily. 90 tablet 3  . budesonide-formoterol (SYMBICORT) 160-4.5 MCG/ACT inhaler Inhale 1 puff into the lungs 2 (two) times daily. 3 Inhaler 2  . CALCIUM-VITAMIN D PO Take 1 tablet by mouth daily.    . Coenzyme Q10 (COQ10 PO) Chew one (1) gummy by mouth daily.    Marland Kitchen enoxaparin (LOVENOX) 80 MG/0.8ML injection Inject  0.8 mLs (80 mg total) into the skin every 12 (twelve) hours. 20 Syringe 1  . hydrochlorothiazide (HYDRODIURIL) 25 MG tablet TAKE ONE TABLET BY MOUTH ONCE DAILY 90 tablet 1  . ibuprofen (ADVIL,MOTRIN) 200 MG tablet Take 400 mg by mouth every 6 (six) hours as needed for headache or mild pain.     Marland Kitchen losartan (COZAAR) 100 MG tablet Take 1 tablet (100 mg total) by mouth daily. 90 tablet 3  . metoprolol succinate (TOPROL-XL) 50 MG 24 hr tablet 50 mg daily.    . Multiple Vitamin (MULTIVITAMIN) tablet Take 1 tablet by mouth daily.    . Omega-3 Fatty Acids (FISH OIL PO) Take 1 capsule by mouth daily. (+ Folic Acid)    . warfarin (COUMADIN) 10 MG tablet TAKE 1 TABLET BY MOUTH ONCE DAILY OR  AS  DIRECTED  BY  COUMADIN  CLINIC 35 tablet 2   No current facility-administered medications for this visit.      Past Medical History:  Diagnosis Date  . Allergy   . Allergy history unknown   . Anxiety   . Asthma   . CHF (congestive heart failure) ([place])   . Dyslipidemia   . HTN (hypertension)   . Labyrinthitis    hx  . Mitral valve replaced    hx    Past Surgical History:  Procedure Laterality Date  . ACNE CYST REMOVAL     from hand and back  . Jaw surgery (other)    . NASAL SINUS SURGERY N/A 10/16/2012   Procedure: NASAL ENDOSCOPIC POLYPECTOMY/MAXILLARY ANTROSTOMY/ETHMOIDECTOMY;  Surgeon: Izora Gala, MD;  Location: [facility];  Service: ENT;  Laterality: N/A;  . TOOTH EXTRACTION    . VALVE REPLACEMENT  1998   St. Jude, mitral    Social History   Socioeconomic History  . Marital status: Married    Spouse name: Frenchie   . Number of children: 2  . Years of education: Not on file  . Highest education level: Not on file  Occupational History  . Occupation: Engineer, agricultural    Employer: [employer]  Social Needs  . Financial resource strain: Not on file  . Food insecurity:    Worry: Not on file    Inability: Not on file  . Transportation needs:    Medical: Not on file    Non-medical: Not on file   Tobacco Use  . Smoking status: Former Smoker    Last attempt to quit: 04/26/1991    Years since quitting: 26.1  . Smokeless tobacco: Never Used  Substance and Sexual Activity  . Alcohol use: No  . Drug use: No  . Sexual activity: Not on file  Lifestyle  . Physical activity:    Days per week: Not on file    Minutes per session: Not on file  . Stress: Not on file  Relationships  . Social connections:    Talks on phone: Not on file    Gets together: Not on file    Attends religious service: Not on file    Active member of club or organization: Not on file    Attends meetings of clubs or organizations: Not on file    Relationship status: Not on file  . Intimate partner violence:    Fear of current or ex partner: Not on file    Emotionally abused: Not on file    Physically abused: Not on file    Forced sexual activity: Not on file  Other Topics Concern  . Not on file  Social History Narrative   Some exercise, bike and walking.  2 cups per day.     Family History  Problem Relation Age of Onset  . Heart attack Father 19  . Colon cancer Mother 51  . Hypertension Mother     ROS: no fevers or chills, productive cough, hemoptysis, dysphasia, odynophagia, melena, hematochezia, dysuria, hematuria, rash, seizure activity, orthopnea, PND, pedal edema, claudication. Remaining systems are negative.  Physical Exam: Well-developed well-nourished in no acute distress.  Skin is warm and dry.  HEENT is normal.  Neck is supple.  Chest is clear to auscultation with normal expansion.  Cardiovascular exam is regular rate and rhythm.  Crisp mechanical valve sound Abdominal exam nontender or distended. No masses palpated. Extremities show no edema. neuro grossly intact  ECG-sinus rhythm at a rate of 61.  Left ventricular hypertrophy.  Personally reviewed  A/P  1 cardiomyopathy-this is felt to be nonischemic in etiology as prior nuclear study did not suggest ischemic heart disease.   However he is having some exertional chest pain and I therefore think definitive evaluation is warranted.  We will plan to proceed with right and left cardiac catheterization both to exclude coronary disease and measure right heart pressures.  The risks and benefits were discussed and he agrees to proceed.  Hold Coumadin prior to procedure and treat with Lovenox bridge given mitral valve replacement and decreased LV function.  Hold hydrochlorothiazide the day before the procedure.  Continue Toprol and ARB.  Repeat echocardiogram showed ejection fraction 35 to 40%.  We have  therefore not pursued ICD.  Patient also notes some dyspnea on exertion.  He is not volume overloaded on examination.  If pulmonary capillary wedge pressure elevated will add low-dose diuretic.  2 status post mitral valve replacement-continue SBE prophylaxis.  Continue Coumadin and aspirin.  Will need Lovenox bridge at time of catheterization.  3 hypertension-blood pressure is elevated but he states controlled at home.  We will follow and advance regimen as needed.  4 hyperlipidemia-continue statin.  Kirk Ruths, MD

## 2017-06-26 NOTE — H&P (View-Only) (Signed)
HPI: FU mitral valve replacement with a St. Jude valve in 1998 and cardiomyopathy. Note pre-operative cardiac catheterization prior to MVR revealed normal coronary arteries. Echocardiogram in Oct 2014 showed normal LV function. The left atrium was mildly dilated. 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 Abdominal ultrasound November 2015 showed no aneurysm. Echocardiogram repeated May 2018 and showed newly reduced LV function with ejection fraction 35%, mild left ventricular hypertrophy, normally functioning mitral valve, mild left atrial enlargement, mild right ventricular enlargement. Nuclear study May 2018 showed ejection fraction 38%. There was inferior thinning versus infarct but no ischemia.  Echocardiogram December 2018 showed ejection fraction 35-40% with diffuse hypokinesis.  The ascending aorta measured 43 mm.  There was a mechanical valve with mean gradient 3 mmHg.  Mild biatrial enlargement.  Since I last saw him,  he notes mild increased dyspnea on exertion.  No orthopnea, PND or pedal edema.  He describes some chest discomfort and left arm numbness when he initially begins walking resolves with continued activity.  No symptoms at rest.   Current Outpatient Medications  Medication Sig Dispense Refill  . albuterol (PROAIR HFA) 108 (90 Base) MCG/ACT inhaler Inhale 2 puffs into the lungs every 6 (six) hours as needed for wheezing. 3 Inhaler 2  . ALPRAZolam (XANAX) 0.5 MG tablet TAKE 1 TABLET BY MOUTH ONCE DAILY AS NEEDED FOR ANXIETY 10 tablet 0  . aspirin 81 MG EC tablet Take 81 mg by mouth daily.     Marland Kitchen atorvastatin (LIPITOR) 20 MG tablet Take 1 tablet (20 mg total) by mouth daily. 90 tablet 3  . budesonide-formoterol (SYMBICORT) 160-4.5 MCG/ACT inhaler Inhale 1 puff into the lungs 2 (two) times daily. 3 Inhaler 2  . CALCIUM-VITAMIN D PO Take 1 tablet by mouth daily.    . Coenzyme Q10 (COQ10 PO) Chew one (1) gummy by mouth daily.    Marland Kitchen enoxaparin (LOVENOX) 80 MG/0.8ML injection Inject  0.8 mLs (80 mg total) into the skin every 12 (twelve) hours. 20 Syringe 1  . hydrochlorothiazide (HYDRODIURIL) 25 MG tablet TAKE ONE TABLET BY MOUTH ONCE DAILY 90 tablet 1  . ibuprofen (ADVIL,MOTRIN) 200 MG tablet Take 400 mg by mouth every 6 (six) hours as needed for headache or mild pain.     Marland Kitchen losartan (COZAAR) 100 MG tablet Take 1 tablet (100 mg total) by mouth daily. 90 tablet 3  . metoprolol succinate (TOPROL-XL) 50 MG 24 hr tablet 50 mg daily.    . Multiple Vitamin (MULTIVITAMIN) tablet Take 1 tablet by mouth daily.    . Omega-3 Fatty Acids (FISH OIL PO) Take 1 capsule by mouth daily. (+ Folic Acid)    . warfarin (COUMADIN) 10 MG tablet TAKE 1 TABLET BY MOUTH ONCE DAILY OR  AS  DIRECTED  BY  COUMADIN  CLINIC 35 tablet 2   No current facility-administered medications for this visit.      Past Medical History:  Diagnosis Date  . Allergy   . Allergy history unknown   . Anxiety   . Asthma   . CHF (congestive heart failure) ([place])   . Dyslipidemia   . HTN (hypertension)   . Labyrinthitis    hx  . Mitral valve replaced    hx    Past Surgical History:  Procedure Laterality Date  . ACNE CYST REMOVAL     from hand and back  . Jaw surgery (other)    . NASAL SINUS SURGERY N/A 10/16/2012   Procedure: NASAL ENDOSCOPIC POLYPECTOMY/MAXILLARY ANTROSTOMY/ETHMOIDECTOMY;  Surgeon: Izora Gala, MD;  Location: [facility];  Service: ENT;  Laterality: N/A;  . TOOTH EXTRACTION    . VALVE REPLACEMENT  1998   St. Jude, mitral    Social History   Socioeconomic History  . Marital status: Married    Spouse name: Frenchie   . Number of children: 2  . Years of education: Not on file  . Highest education level: Not on file  Occupational History  . Occupation: Engineer, agricultural    Employer: [employer]  Social Needs  . Financial resource strain: Not on file  . Food insecurity:    Worry: Not on file    Inability: Not on file  . Transportation needs:    Medical: Not on file    Non-medical: Not on file   Tobacco Use  . Smoking status: Former Smoker    Last attempt to quit: 04/26/1991    Years since quitting: 26.1  . Smokeless tobacco: Never Used  Substance and Sexual Activity  . Alcohol use: No  . Drug use: No  . Sexual activity: Not on file  Lifestyle  . Physical activity:    Days per week: Not on file    Minutes per session: Not on file  . Stress: Not on file  Relationships  . Social connections:    Talks on phone: Not on file    Gets together: Not on file    Attends religious service: Not on file    Active member of club or organization: Not on file    Attends meetings of clubs or organizations: Not on file    Relationship status: Not on file  . Intimate partner violence:    Fear of current or ex partner: Not on file    Emotionally abused: Not on file    Physically abused: Not on file    Forced sexual activity: Not on file  Other Topics Concern  . Not on file  Social History Narrative   Some exercise, bike and walking.  2 cups per day.     Family History  Problem Relation Age of Onset  . Heart attack Father 70  . Colon cancer Mother 85  . Hypertension Mother     ROS: no fevers or chills, productive cough, hemoptysis, dysphasia, odynophagia, melena, hematochezia, dysuria, hematuria, rash, seizure activity, orthopnea, PND, pedal edema, claudication. Remaining systems are negative.  Physical Exam: Well-developed well-nourished in no acute distress.  Skin is warm and dry.  HEENT is normal.  Neck is supple.  Chest is clear to auscultation with normal expansion.  Cardiovascular exam is regular rate and rhythm.  Crisp mechanical valve sound Abdominal exam nontender or distended. No masses palpated. Extremities show no edema. neuro grossly intact  ECG-sinus rhythm at a rate of 61.  Left ventricular hypertrophy.  Personally reviewed  A/P  1 cardiomyopathy-this is felt to be nonischemic in etiology as prior nuclear study did not suggest ischemic heart disease.   However he is having some exertional chest pain and I therefore think definitive evaluation is warranted.  We will plan to proceed with right and left cardiac catheterization both to exclude coronary disease and measure right heart pressures.  The risks and benefits were discussed and he agrees to proceed.  Hold Coumadin prior to procedure and treat with Lovenox bridge given mitral valve replacement and decreased LV function.  Hold hydrochlorothiazide the day before the procedure.  Continue Toprol and ARB.  Repeat echocardiogram showed ejection fraction 35 to 40%.  We have  therefore not pursued ICD.  Patient also notes some dyspnea on exertion.  He is not volume overloaded on examination.  If pulmonary capillary wedge pressure elevated will add low-dose diuretic.  2 status post mitral valve replacement-continue SBE prophylaxis.  Continue Coumadin and aspirin.  Will need Lovenox bridge at time of catheterization.  3 hypertension-blood pressure is elevated but he states controlled at home.  We will follow and advance regimen as needed.  4 hyperlipidemia-continue statin.  Kirk Ruths, MD

## 2017-07-03 ENCOUNTER — Ambulatory Visit (INDEPENDENT_AMBULATORY_CARE_PROVIDER_SITE_OTHER): Payer: Medicare Other | Admitting: Pharmacist

## 2017-07-03 ENCOUNTER — Other Ambulatory Visit: Payer: Self-pay | Admitting: *Deleted

## 2017-07-03 ENCOUNTER — Encounter: Payer: Self-pay | Admitting: Cardiology

## 2017-07-03 ENCOUNTER — Ambulatory Visit (INDEPENDENT_AMBULATORY_CARE_PROVIDER_SITE_OTHER): Payer: Medicare Other | Admitting: Cardiology

## 2017-07-03 VITALS — BP 170/97 | HR 62 | Ht 76.0 in | Wt 203.0 lb

## 2017-07-03 DIAGNOSIS — Z9889 Other specified postprocedural states: Secondary | ICD-10-CM

## 2017-07-03 DIAGNOSIS — R072 Precordial pain: Secondary | ICD-10-CM

## 2017-07-03 DIAGNOSIS — I10 Essential (primary) hypertension: Secondary | ICD-10-CM | POA: Diagnosis not present

## 2017-07-03 DIAGNOSIS — I428 Other cardiomyopathies: Secondary | ICD-10-CM | POA: Diagnosis not present

## 2017-07-03 DIAGNOSIS — I059 Rheumatic mitral valve disease, unspecified: Secondary | ICD-10-CM | POA: Diagnosis not present

## 2017-07-03 DIAGNOSIS — Z7901 Long term (current) use of anticoagulants: Secondary | ICD-10-CM

## 2017-07-03 LAB — POCT INR: INR: 2.5

## 2017-07-03 MED ORDER — ENOXAPARIN SODIUM 100 MG/ML ~~LOC~~ SOLN: 100.0000 mg | Freq: Two times a day (BID) | SUBCUTANEOUS | 0 refills | Status: DC

## 2017-07-03 NOTE — Patient Instructions (Signed)
   [facility] [address] [place] [address] Dept: [phone] Loc: [facility]  07/03/2017  You are scheduled for a Cardiac Catheterization on Thursday, May 9 with Dr. Peter Martinique.  1. Please arrive at the [facility] (Main Entrance A) at [facility]: [address] [place], [place] [address] at 5:30 AM (two hours before your procedure to ensure your preparation). Free valet parking service is available.   Special note: Every effort is made to have your procedure done on time. Please understand that emergencies sometimes delay scheduled procedures.  2. Diet: Do not eat or drink anything after midnight prior to your procedure except sips of water to take medications.  3. Labs: Your physician recommends that you  HAVE LAB WORK TODAY  4. Medication instructions in preparation for your procedure:  STOP TAKING WARFARIN AS DIRECTED BY PHARM MD  DO NOT TAKE HCTZ THE MORNING OF THE PROCEDURE  On the morning of your procedure, take your morning medicines NOT listed above.  You may use sips of water.  5. Plan for one night stay--bring personal belongings. 6. Bring a current list of your medications and current insurance cards. 7. You MUST have a responsible person to drive you home. 8. Someone MUST be with you the first 24 hours after you arrive home or your discharge will be delayed. 9. Please wear clothes that are easy to get on and off and wear slip-on shoes.  Thank you for allowing [place] to care for you!   -- [facility]

## 2017-07-03 NOTE — Patient Instructions (Signed)
5/3: Last dose of Coumadin.  5/4: No Coumadin or Lovenox.  5/5: Inject Lovenox 100 mg in the fatty abdominal tissue at least 2 inches from the belly button twice a day about 12 hours apart, 8am and 8pm rotate sites. No Coumadin.  5/6: Inject Lovenox in the fatty tissue every 12 hours, 8am and 8pm. No Coumadin.  5/7: Inject Lovenox in the fatty tissue every 12 hours, 8am and 8pm. No Coumadin.  5/8: Inject Lovenox in the fatty tissue in the morning at 8 am (No PM dose). No Coumadin.  5/9: Procedure Day - No Lovenox - Resume Coumadin in the evening or as directed by doctor   5/10: Resume Lovenox inject in the fatty tissue every 12 hours and take Coumadin 15mg .  5/11: Inject Lovenox in the fatty tissue every 12 hours and take Coumadin 15mg .  5/12: Inject Lovenox in the fatty tissue every 12 hours and take Coumadin 10mg .  5/13: Inject Lovenox in the fatty tissue every 12 hours and take Coumadin 10mg .  5/14: Inject Lovenox in the fatty tissue every 12 hours and take Coumadin 10mg .  5/15: Coumadin appt to check INR at 10am.

## 2017-07-04 ENCOUNTER — Encounter: Payer: Self-pay | Admitting: *Deleted

## 2017-07-04 LAB — BASIC METABOLIC PANEL
BUN/Creatinine Ratio: 11 (ref 10–24)
BUN: 12 mg/dL (ref 8–27)
CALCIUM: 9.5 mg/dL (ref 8.6–10.2)
CHLORIDE: 96 mmol/L (ref 96–106)
CO2: 27 mmol/L (ref 20–29)
Creatinine, Ser: 1.06 mg/dL (ref 0.76–1.27)
GFR calc Af Amer: 82 mL/min/{1.73_m2} (ref >59)
GFR calc non Af Amer: 71 mL/min/{1.73_m2} (ref >59)
GLUCOSE: 95 mg/dL (ref 65–99)
POTASSIUM: 4.3 mmol/L (ref 3.5–5.2)
Sodium: 140 mmol/L (ref 134–144)

## 2017-07-04 LAB — CBC
Hematocrit: 44.2 % (ref 37.5–51.0)
Hemoglobin: 15.6 g/dL (ref 13.0–17.7)
MCH: 30.2 pg (ref 26.6–33.0)
MCHC: 35.3 g/dL (ref 31.5–35.7)
MCV: 86 fL (ref 79–97)
PLATELETS: 147 10*3/uL — AB (ref 150–379)
RBC: 5.17 x10E6/uL (ref 4.14–5.80)
RDW: 14 % (ref 12.3–15.4)
WBC: 4.6 10*3/uL (ref 3.4–10.8)

## 2017-07-11 ENCOUNTER — Telehealth: Payer: Self-pay | Admitting: *Deleted

## 2017-07-11 ENCOUNTER — Telehealth: Payer: Self-pay | Admitting: Cardiology

## 2017-07-11 NOTE — Telephone Encounter (Signed)
This message need to be sent to Desiree Lucy, RN

## 2017-07-11 NOTE — Telephone Encounter (Addendum)
New Message:      Pt returning a call for procedure instructions  from [facility] [place]. Webb Silversmith has left for the day.

## 2017-07-11 NOTE — Telephone Encounter (Signed)
Catheterization scheduled at [facility] for: Thursday Jul 12, 2017 3 PM Verify arrival time and place: [facility] Entrance A at: 1 PM  No solid food after midnight prior to cath, clear liquids until 5 AM day of procedure. Verify allergies in Epic Verify no diabetes medications.  Hold: Warfarin as directed by Coumadin Clinic--last dose on Friday Jul 06, 3017 per CVRR notes/bridge with Lovenox HCTZ 07/12/17  Except hold medications AM meds can be  taken pre-cath with sip of water including: ASA 81 mg  Confirm patient has responsible person to drive home post procedure and observe patient for 24 hours  LMTCB to discuss instructions-**note the time scheduled is different that time on cath letter 06/23/17-cath letter 7:30 AM arrive 5:30 PM

## 2017-07-12 ENCOUNTER — Ambulatory Visit (HOSPITAL_COMMUNITY)
Admission: RE | Admit: 2017-07-12 | Discharge: 2017-07-12 | Disposition: A | Discharge status: Home / Self Care | Payer: Medicare Other | Source: Ambulatory Visit | Attending: Cardiovascular Disease | Admitting: Cardiovascular Disease

## 2017-07-12 ENCOUNTER — Ambulatory Visit (HOSPITAL_COMMUNITY)
Admission: RE | Disposition: A | Discharge status: Home / Self Care | Payer: Self-pay | Source: Ambulatory Visit | Attending: Cardiovascular Disease

## 2017-07-12 DIAGNOSIS — I428 Other cardiomyopathies: Secondary | ICD-10-CM | POA: Diagnosis not present

## 2017-07-12 DIAGNOSIS — I2510 Atherosclerotic heart disease of native coronary artery without angina pectoris: Secondary | ICD-10-CM

## 2017-07-12 DIAGNOSIS — Z7901 Long term (current) use of anticoagulants: Secondary | ICD-10-CM | POA: Insufficient documentation

## 2017-07-12 DIAGNOSIS — Z87891 Personal history of nicotine dependence: Secondary | ICD-10-CM | POA: Diagnosis not present

## 2017-07-12 DIAGNOSIS — I509 Heart failure, unspecified: Secondary | ICD-10-CM | POA: Insufficient documentation

## 2017-07-12 DIAGNOSIS — Z7951 Long term (current) use of inhaled steroids: Secondary | ICD-10-CM | POA: Diagnosis not present

## 2017-07-12 DIAGNOSIS — E785 Hyperlipidemia, unspecified: Secondary | ICD-10-CM | POA: Diagnosis not present

## 2017-07-12 DIAGNOSIS — Z7982 Long term (current) use of aspirin: Secondary | ICD-10-CM | POA: Diagnosis not present

## 2017-07-12 DIAGNOSIS — F419 Anxiety disorder, unspecified: Secondary | ICD-10-CM | POA: Insufficient documentation

## 2017-07-12 DIAGNOSIS — J45909 Unspecified asthma, uncomplicated: Secondary | ICD-10-CM | POA: Insufficient documentation

## 2017-07-12 DIAGNOSIS — I110 Hypertensive heart disease with heart failure: Secondary | ICD-10-CM | POA: Insufficient documentation

## 2017-07-12 DIAGNOSIS — R072 Precordial pain: Secondary | ICD-10-CM

## 2017-07-12 DIAGNOSIS — Z8249 Family history of ischemic heart disease and other diseases of the circulatory system: Secondary | ICD-10-CM | POA: Diagnosis not present

## 2017-07-12 DIAGNOSIS — I253 Aneurysm of heart: Secondary | ICD-10-CM | POA: Diagnosis not present

## 2017-07-12 DIAGNOSIS — Z952 Presence of prosthetic heart valve: Secondary | ICD-10-CM | POA: Diagnosis not present

## 2017-07-12 HISTORY — PX: RIGHT/LEFT HEART CATH AND CORONARY ANGIOGRAPHY: CATH118266

## 2017-07-12 LAB — POCT I-STAT 3, VENOUS BLOOD GAS (G3P V)
ACID-BASE DEFICIT: 1 mmol/L (ref 0.0–2.0)
Bicarbonate: 24.5 mmol/L (ref 20.0–28.0)
O2 SAT: 78 %
TCO2: 26 mmol/L (ref 22–32)
pCO2, Ven: 41.5 mmHg — ABNORMAL LOW (ref 44.0–60.0)
pH, Ven: 7.38 (ref 7.250–7.430)
pO2, Ven: 44 mmHg (ref 32.0–45.0)

## 2017-07-12 LAB — POCT I-STAT 3, ART BLOOD GAS (G3+)
ACID-BASE DEFICIT: 1 mmol/L (ref 0.0–2.0)
Bicarbonate: 24.1 mmol/L (ref 20.0–28.0)
O2 Saturation: 96 %
PH ART: 7.4 (ref 7.350–7.450)
TCO2: 25 mmol/L (ref 22–32)
pCO2 arterial: 38.9 mmHg (ref 32.0–48.0)
pO2, Arterial: 81 mmHg — ABNORMAL LOW (ref 83.0–108.0)

## 2017-07-12 LAB — PROTIME-INR
INR: 1.05
Prothrombin Time: 13.7 seconds (ref 11.4–15.2)

## 2017-07-12 SURGERY — RIGHT/LEFT HEART CATH AND CORONARY ANGIOGRAPHY
Anesthesia: LOCAL

## 2017-07-12 MED ORDER — IOHEXOL 350 MG/ML SOLN
INTRAVENOUS | Status: DC | PRN
  Administered 2017-07-12: 95 mL via INTRA_ARTERIAL

## 2017-07-12 MED ORDER — SODIUM CHLORIDE 0.9 % IV SOLN: 250.0000 mL | INTRAVENOUS | Status: DC | PRN

## 2017-07-12 MED ORDER — ASPIRIN 81 MG PO CHEW
CHEWABLE_TABLET | ORAL | Status: AC
  Filled 2017-07-12: qty 1

## 2017-07-12 MED ORDER — SODIUM CHLORIDE 0.9 % WEIGHT BASED INFUSION: 1.0000 mL/kg/h | INTRAVENOUS | Status: DC

## 2017-07-12 MED ORDER — FENTANYL CITRATE (PF) 100 MCG/2ML IJ SOLN
INTRAMUSCULAR | Status: AC
  Filled 2017-07-12: qty 2

## 2017-07-12 MED ORDER — MIDAZOLAM HCL 2 MG/2ML IJ SOLN
INTRAMUSCULAR | Status: AC
  Filled 2017-07-12: qty 2

## 2017-07-12 MED ORDER — SODIUM CHLORIDE 0.9% FLUSH: 3.0000 mL | Freq: Two times a day (BID) | INTRAVENOUS | Status: DC

## 2017-07-12 MED ORDER — LIDOCAINE HCL (PF) 1 % IJ SOLN
INTRAMUSCULAR | Status: AC
  Filled 2017-07-12: qty 30

## 2017-07-12 MED ORDER — FENTANYL CITRATE (PF) 100 MCG/2ML IJ SOLN
INTRAMUSCULAR | Status: DC | PRN
  Administered 2017-07-12: 50 ug via INTRAVENOUS

## 2017-07-12 MED ORDER — ASPIRIN 81 MG PO CHEW
81.0000 mg | CHEWABLE_TABLET | ORAL | Status: AC
  Administered 2017-07-12: 81 mg via ORAL

## 2017-07-12 MED ORDER — HEPARIN (PORCINE) IN NACL 2-0.9 UNITS/ML
INTRAMUSCULAR | Status: AC | PRN
  Administered 2017-07-12 (×2): 500 mL

## 2017-07-12 MED ORDER — LIDOCAINE HCL (PF) 1 % IJ SOLN
INTRAMUSCULAR | Status: DC | PRN
  Administered 2017-07-12 (×2): 2 mL via SUBCUTANEOUS

## 2017-07-12 MED ORDER — SODIUM CHLORIDE 0.9% FLUSH: 3.0000 mL | INTRAVENOUS | Status: DC | PRN

## 2017-07-12 MED ORDER — HEPARIN SODIUM (PORCINE) 1000 UNIT/ML IJ SOLN
INTRAMUSCULAR | Status: DC | PRN
  Administered 2017-07-12: 5000 [IU] via INTRAVENOUS

## 2017-07-12 MED ORDER — SODIUM CHLORIDE 0.9 % WEIGHT BASED INFUSION
3.0000 mL/kg/h | INTRAVENOUS | Status: AC
  Administered 2017-07-12: 3 mL/kg/h via INTRAVENOUS

## 2017-07-12 MED ORDER — HEPARIN (PORCINE) IN NACL 1000-0.9 UT/500ML-% IV SOLN
INTRAVENOUS | Status: AC
  Filled 2017-07-12: qty 1000

## 2017-07-12 MED ORDER — VERAPAMIL HCL 2.5 MG/ML IV SOLN
INTRAVENOUS | Status: AC
  Filled 2017-07-12: qty 2

## 2017-07-12 MED ORDER — ONDANSETRON HCL 4 MG/2ML IJ SOLN: 4.0000 mg | Freq: Four times a day (QID) | INTRAMUSCULAR | Status: DC | PRN

## 2017-07-12 MED ORDER — HEPARIN (PORCINE) IN NACL 2-0.9 UNIT/ML-% IJ SOLN
INTRAMUSCULAR | Status: DC | PRN
  Administered 2017-07-12: 10 mL via INTRA_ARTERIAL

## 2017-07-12 MED ORDER — MIDAZOLAM HCL 2 MG/2ML IJ SOLN
INTRAMUSCULAR | Status: DC | PRN
  Administered 2017-07-12: 2 mg via INTRAVENOUS

## 2017-07-12 MED ORDER — HEPARIN SODIUM (PORCINE) 1000 UNIT/ML IJ SOLN
INTRAMUSCULAR | Status: AC
  Filled 2017-07-12: qty 1

## 2017-07-12 MED ORDER — SODIUM CHLORIDE 0.9 % IV SOLN: INTRAVENOUS | Status: AC

## 2017-07-12 MED ORDER — ACETAMINOPHEN 325 MG PO TABS: 650.0000 mg | ORAL_TABLET | ORAL | Status: DC | PRN

## 2017-07-12 SURGICAL SUPPLY — 14 items
CATH BALLN WEDGE 5F 110CM (CATHETERS) ×1 IMPLANT
CATH INFINITI 5 FR JL3.5 (CATHETERS) ×1 IMPLANT
CATH INFINITI 5FR ANG PIGTAIL (CATHETERS) ×1 IMPLANT
CATH INFINITI JR4 5F (CATHETERS) ×1 IMPLANT
DEVICE RAD COMP TR BAND LRG (VASCULAR PRODUCTS) ×1 IMPLANT
GLIDESHEATH SLEND SS 6F .021 (SHEATH) ×1 IMPLANT
GUIDEWIRE INQWIRE 1.5J.035X260 (WIRE) IMPLANT
INQWIRE 1.5J .035X260CM (WIRE) ×2
KIT HEART LEFT (KITS) ×2 IMPLANT
PACK CARDIAC CATHETERIZATION (CUSTOM PROCEDURE TRAY) ×2 IMPLANT
SHEATH RAIN 4/5FR (SHEATH) ×1 IMPLANT
SYR MEDRAD MARK V 150ML (SYRINGE) ×2 IMPLANT
TRANSDUCER W/STOPCOCK (MISCELLANEOUS) ×2 IMPLANT
TUBING CIL FLEX 10 FLL-RA (TUBING) ×2 IMPLANT

## 2017-07-12 NOTE — Telephone Encounter (Signed)
LMTCB -returning call to patient.

## 2017-07-12 NOTE — Telephone Encounter (Signed)
Pt at [facility] for procedure

## 2017-07-12 NOTE — Interval H&P Note (Signed)
History and Physical Interval Note:  07/12/2017 3:15 PM  Corey Mathis  has presented today for cardiac cath with the diagnosis of cardiomyopathy, unstable angina. The various methods of treatment have been discussed with the patient and family. After consideration of risks, benefits and other options for treatment, the patient has consented to  Procedure(s): RIGHT/LEFT HEART CATH AND CORONARY ANGIOGRAPHY (N/A) as a surgical intervention .  The patient's history has been reviewed, patient examined, no change in status, stable for surgery.  I have reviewed the patient's chart and labs.  Questions were answered to the patient's satisfaction.    Cath Lab Visit (complete for each Cath Lab visit)  Clinical Evaluation Leading to the Procedure:   ACS: No.  Non-ACS:    Anginal Classification: CCS II  Anti-ischemic medical therapy: Minimal Therapy (1 class of medications)  Non-Invasive Test Results: No non-invasive testing performed  Prior CABG: No previous CABG         Lauree Chandler

## 2017-07-12 NOTE — Telephone Encounter (Signed)
See phone note dated 07/11/17

## 2017-07-12 NOTE — Discharge Instructions (Signed)
Resume Lovenox injections on the morning of 07/13/17 if no bleeding from arm.   Radial Site Care Refer to this sheet in the next few weeks. These instructions provide you with information about caring for yourself after your procedure. Your health care provider may also give you more specific instructions. Your treatment has been planned according to current medical practices, but problems sometimes occur. Call your health care provider if you have any problems or questions after your procedure. What can I expect after the procedure? After your procedure, it is typical to have the following:  Bruising at the radial site that usually fades within 1-2 weeks.  Blood collecting in the tissue (hematoma) that may be painful to the touch. It should usually decrease in size and tenderness within 1-2 weeks.  Follow these instructions at home:  Take medicines only as directed by your health care provider.  You may shower 24-48 hours after the procedure or as directed by your health care provider. Remove the bandage (dressing) and gently wash the site with plain soap and water. Pat the area dry with a clean towel. Do not rub the site, because this may cause bleeding.  Do not take baths, swim, or use a hot tub until your health care provider approves.  Check your insertion site every day for redness, swelling, or drainage.  Do not apply powder or lotion to the site.  Do not flex or bend the affected arm for 24 hours or as directed by your health care provider.  Do not push or pull heavy objects with the affected arm for 24 hours or as directed by your health care provider.  Do not lift over 10 lb (4.5 kg) for 5 days after your procedure or as directed by your health care provider.  Ask your health care provider when it is okay to: ? Return to work or school. ? Resume usual physical activities or sports. ? Resume sexual activity.  Do not drive home if you are discharged the same day as the  procedure. Have someone else drive you.  You may drive 24 hours after the procedure unless otherwise instructed by your health care provider.  Do not operate machinery or power tools for 24 hours after the procedure.  If your procedure was done as an outpatient procedure, which means that you went home the same day as your procedure, a responsible adult should be with you for the first 24 hours after you arrive home.  Keep all follow-up visits as directed by your health care provider. This is important. Contact a health care provider if:  You have a fever.  You have chills.  You have increased bleeding from the radial site. Hold pressure on the site. CALL 911 Get help right away if:  You have unusual pain at the radial site.  You have redness, warmth, or swelling at the radial site.  You have drainage (other than a small amount of blood on the dressing) from the radial site.  The radial site is bleeding, and the bleeding does not stop after 30 minutes of holding steady pressure on the site.  Your arm or hand becomes pale, cool, tingly, or numb. This information is not intended to replace advice given to you by your health care provider. Make sure you discuss any questions you have with your health care provider. Document Released: 03/25/2010 Document Revised: 07/29/2015 Document Reviewed: 09/08/2013 Elsevier Interactive Patient Education  2018 [employer].

## 2017-07-12 NOTE — Progress Notes (Signed)
Pt is aware of how to take his coumadin and warfarin, pt will call Dr office with further questions.

## 2017-07-13 ENCOUNTER — Encounter (HOSPITAL_COMMUNITY): Payer: Self-pay | Admitting: Cardiovascular Disease

## 2017-07-17 MED FILL — Heparin Sod (Porcine)-NaCl IV Soln 1000 Unit/500ML-0.9%: INTRAVENOUS | Qty: 1000 | Status: AC

## 2017-07-18 ENCOUNTER — Ambulatory Visit (INDEPENDENT_AMBULATORY_CARE_PROVIDER_SITE_OTHER): Payer: Medicare Other | Admitting: *Deleted

## 2017-07-18 DIAGNOSIS — Z5181 Encounter for therapeutic drug level monitoring: Secondary | ICD-10-CM | POA: Diagnosis not present

## 2017-07-18 DIAGNOSIS — Z7901 Long term (current) use of anticoagulants: Secondary | ICD-10-CM | POA: Diagnosis not present

## 2017-07-18 DIAGNOSIS — I059 Rheumatic mitral valve disease, unspecified: Secondary | ICD-10-CM

## 2017-07-18 DIAGNOSIS — Z9889 Other specified postprocedural states: Secondary | ICD-10-CM

## 2017-07-18 LAB — POCT INR: INR: 1.8

## 2017-07-18 MED ORDER — ENOXAPARIN SODIUM 100 MG/ML ~~LOC~~ SOLN: 100.0000 mg | Freq: Two times a day (BID) | SUBCUTANEOUS | 0 refills | Status: DC

## 2017-07-18 NOTE — Patient Instructions (Signed)
Description   Continue Lovenox  injections  100 mg  every 12 hours and today May 15th take 1 and 1/2 tablets of coumadin and tomorrow May 16th take 1 and 1/2 tablets of coumadin then coninue on same dosage 1 tablet (10mg ) daily . Recheck INR 07/23/17.

## 2017-07-23 ENCOUNTER — Ambulatory Visit (INDEPENDENT_AMBULATORY_CARE_PROVIDER_SITE_OTHER): Payer: Medicare Other | Admitting: *Deleted

## 2017-07-23 DIAGNOSIS — Z5181 Encounter for therapeutic drug level monitoring: Secondary | ICD-10-CM

## 2017-07-23 DIAGNOSIS — I059 Rheumatic mitral valve disease, unspecified: Secondary | ICD-10-CM

## 2017-07-23 DIAGNOSIS — Z9889 Other specified postprocedural states: Secondary | ICD-10-CM | POA: Diagnosis not present

## 2017-07-23 DIAGNOSIS — Z7901 Long term (current) use of anticoagulants: Secondary | ICD-10-CM

## 2017-07-23 LAB — POCT INR: INR: 2.1

## 2017-07-23 NOTE — Patient Instructions (Signed)
Description   Continue Lovenox  injections  100 mg  every 12 hours pt states has 2 or 3 injections left so he is to finish those  then today May 20th take 1 and 1/2 tablets and tomorrow May 21st  take 1 and 1/2 tablets and take 1 and 1/2 tablets on Wednesday May 22nd  then coninue on same dosage 1 tablet (10mg ) daily . Recheck INR 07/27/17.

## 2017-07-27 ENCOUNTER — Ambulatory Visit (INDEPENDENT_AMBULATORY_CARE_PROVIDER_SITE_OTHER): Payer: Medicare Other

## 2017-07-27 DIAGNOSIS — I059 Rheumatic mitral valve disease, unspecified: Secondary | ICD-10-CM | POA: Diagnosis not present

## 2017-07-27 DIAGNOSIS — Z7901 Long term (current) use of anticoagulants: Secondary | ICD-10-CM | POA: Diagnosis not present

## 2017-07-27 DIAGNOSIS — Z9889 Other specified postprocedural states: Secondary | ICD-10-CM

## 2017-07-27 LAB — POCT INR: INR: 4.9 — AB (ref 2.0–3.0)

## 2017-07-27 NOTE — Patient Instructions (Signed)
Description   Skip today's dosage of Coumadin, then take 1/2 tablet tomorrow, then resume same dosage 1 tablet (10mg ) daily . Recheck in 10 days.

## 2017-08-07 ENCOUNTER — Ambulatory Visit (INDEPENDENT_AMBULATORY_CARE_PROVIDER_SITE_OTHER): Payer: Medicare Other | Admitting: *Deleted

## 2017-08-07 DIAGNOSIS — Z9889 Other specified postprocedural states: Secondary | ICD-10-CM | POA: Diagnosis not present

## 2017-08-07 DIAGNOSIS — Z7901 Long term (current) use of anticoagulants: Secondary | ICD-10-CM

## 2017-08-07 DIAGNOSIS — I059 Rheumatic mitral valve disease, unspecified: Secondary | ICD-10-CM | POA: Diagnosis not present

## 2017-08-07 LAB — POCT INR: INR: 2.7 (ref 2.0–3.0)

## 2017-08-07 NOTE — Patient Instructions (Signed)
Description   Continue taking Coumadin 1 tablet (10mg ) daily . Recheck in 2 weeks. Coumadin Clinic [phone]

## 2017-08-10 ENCOUNTER — Emergency Department (HOSPITAL_COMMUNITY): Payer: Medicare Other

## 2017-08-10 ENCOUNTER — Inpatient Hospital Stay (HOSPITAL_COMMUNITY)
Admission: EM | Admit: 2017-08-10 | Discharge: 2017-08-31 | DRG: 958 | Disposition: A | Discharge status: Skilled Nursing Facility | Payer: Medicare Other | Attending: General Surgery | Admitting: General Surgery

## 2017-08-10 DIAGNOSIS — E785 Hyperlipidemia, unspecified: Secondary | ICD-10-CM | POA: Diagnosis present

## 2017-08-10 DIAGNOSIS — M25532 Pain in left wrist: Secondary | ICD-10-CM | POA: Diagnosis not present

## 2017-08-10 DIAGNOSIS — H5789 Other specified disorders of eye and adnexa: Secondary | ICD-10-CM | POA: Diagnosis not present

## 2017-08-10 DIAGNOSIS — S3692XA Contusion of unspecified intra-abdominal organ, initial encounter: Secondary | ICD-10-CM | POA: Diagnosis not present

## 2017-08-10 DIAGNOSIS — Y92410 Unspecified street and highway as the place of occurrence of the external cause: Secondary | ICD-10-CM

## 2017-08-10 DIAGNOSIS — S32029A Unspecified fracture of second lumbar vertebra, initial encounter for closed fracture: Secondary | ICD-10-CM | POA: Diagnosis not present

## 2017-08-10 DIAGNOSIS — S82252A Displaced comminuted fracture of shaft of left tibia, initial encounter for closed fracture: Secondary | ICD-10-CM | POA: Diagnosis not present

## 2017-08-10 DIAGNOSIS — S82831A Other fracture of upper and lower end of right fibula, initial encounter for closed fracture: Secondary | ICD-10-CM | POA: Diagnosis present

## 2017-08-10 DIAGNOSIS — J942 Hemothorax: Secondary | ICD-10-CM

## 2017-08-10 DIAGNOSIS — S82142A Displaced bicondylar fracture of left tibia, initial encounter for closed fracture: Secondary | ICD-10-CM | POA: Diagnosis present

## 2017-08-10 DIAGNOSIS — S3991XA Unspecified injury of abdomen, initial encounter: Secondary | ICD-10-CM | POA: Diagnosis not present

## 2017-08-10 DIAGNOSIS — K5900 Constipation, unspecified: Secondary | ICD-10-CM | POA: Diagnosis not present

## 2017-08-10 DIAGNOSIS — S36899A Unspecified injury of other intra-abdominal organs, initial encounter: Secondary | ICD-10-CM | POA: Diagnosis present

## 2017-08-10 DIAGNOSIS — S00212A Abrasion of left eyelid and periocular area, initial encounter: Secondary | ICD-10-CM | POA: Diagnosis present

## 2017-08-10 DIAGNOSIS — S299XXA Unspecified injury of thorax, initial encounter: Secondary | ICD-10-CM | POA: Diagnosis not present

## 2017-08-10 DIAGNOSIS — S32049A Unspecified fracture of fourth lumbar vertebra, initial encounter for closed fracture: Secondary | ICD-10-CM | POA: Diagnosis present

## 2017-08-10 DIAGNOSIS — D62 Acute posthemorrhagic anemia: Secondary | ICD-10-CM | POA: Diagnosis not present

## 2017-08-10 DIAGNOSIS — S271XXA Traumatic hemothorax, initial encounter: Secondary | ICD-10-CM | POA: Diagnosis present

## 2017-08-10 DIAGNOSIS — S82141A Displaced bicondylar fracture of right tibia, initial encounter for closed fracture: Principal | ICD-10-CM | POA: Diagnosis present

## 2017-08-10 DIAGNOSIS — Z419 Encounter for procedure for purposes other than remedying health state, unspecified: Secondary | ICD-10-CM

## 2017-08-10 DIAGNOSIS — M25062 Hemarthrosis, left knee: Secondary | ICD-10-CM | POA: Diagnosis present

## 2017-08-10 DIAGNOSIS — Z9889 Other specified postprocedural states: Secondary | ICD-10-CM

## 2017-08-10 DIAGNOSIS — IMO0001 Reserved for inherently not codable concepts without codable children: Secondary | ICD-10-CM

## 2017-08-10 DIAGNOSIS — Z01810 Encounter for preprocedural cardiovascular examination: Secondary | ICD-10-CM

## 2017-08-10 DIAGNOSIS — S82001A Unspecified fracture of right patella, initial encounter for closed fracture: Secondary | ICD-10-CM | POA: Diagnosis not present

## 2017-08-10 DIAGNOSIS — I110 Hypertensive heart disease with heart failure: Secondary | ICD-10-CM | POA: Diagnosis present

## 2017-08-10 DIAGNOSIS — T1490XA Injury, unspecified, initial encounter: Secondary | ICD-10-CM

## 2017-08-10 DIAGNOSIS — M25061 Hemarthrosis, right knee: Secondary | ICD-10-CM | POA: Diagnosis present

## 2017-08-10 DIAGNOSIS — X088XXA Exposure to other specified smoke, fire and flames, initial encounter: Secondary | ICD-10-CM | POA: Diagnosis present

## 2017-08-10 DIAGNOSIS — Z952 Presence of prosthetic heart valve: Secondary | ICD-10-CM

## 2017-08-10 DIAGNOSIS — K229 Disease of esophagus, unspecified: Secondary | ICD-10-CM

## 2017-08-10 DIAGNOSIS — R000 Tachycardia, unspecified: Secondary | ICD-10-CM | POA: Diagnosis not present

## 2017-08-10 DIAGNOSIS — S6992XA Unspecified injury of left wrist, hand and finger(s), initial encounter: Secondary | ICD-10-CM | POA: Diagnosis not present

## 2017-08-10 DIAGNOSIS — S199XXA Unspecified injury of neck, initial encounter: Secondary | ICD-10-CM | POA: Diagnosis not present

## 2017-08-10 DIAGNOSIS — Z7901 Long term (current) use of anticoagulants: Secondary | ICD-10-CM

## 2017-08-10 DIAGNOSIS — S301XXA Contusion of abdominal wall, initial encounter: Secondary | ICD-10-CM | POA: Diagnosis present

## 2017-08-10 DIAGNOSIS — B9620 Unspecified Escherichia coli [E. coli] as the cause of diseases classified elsewhere: Secondary | ICD-10-CM | POA: Diagnosis present

## 2017-08-10 DIAGNOSIS — I5022 Chronic systolic (congestive) heart failure: Secondary | ICD-10-CM | POA: Diagnosis present

## 2017-08-10 DIAGNOSIS — I2510 Atherosclerotic heart disease of native coronary artery without angina pectoris: Secondary | ICD-10-CM | POA: Diagnosis present

## 2017-08-10 DIAGNOSIS — R0902 Hypoxemia: Secondary | ICD-10-CM | POA: Diagnosis not present

## 2017-08-10 DIAGNOSIS — M25572 Pain in left ankle and joints of left foot: Secondary | ICD-10-CM | POA: Diagnosis not present

## 2017-08-10 DIAGNOSIS — S82191A Other fracture of upper end of right tibia, initial encounter for closed fracture: Secondary | ICD-10-CM | POA: Diagnosis not present

## 2017-08-10 DIAGNOSIS — Z888 Allergy status to other drugs, medicaments and biological substances status: Secondary | ICD-10-CM

## 2017-08-10 DIAGNOSIS — N390 Urinary tract infection, site not specified: Secondary | ICD-10-CM | POA: Diagnosis present

## 2017-08-10 DIAGNOSIS — I428 Other cardiomyopathies: Secondary | ICD-10-CM | POA: Diagnosis present

## 2017-08-10 DIAGNOSIS — Z885 Allergy status to narcotic agent status: Secondary | ICD-10-CM

## 2017-08-10 DIAGNOSIS — S0993XA Unspecified injury of face, initial encounter: Secondary | ICD-10-CM | POA: Diagnosis not present

## 2017-08-10 DIAGNOSIS — S99912A Unspecified injury of left ankle, initial encounter: Secondary | ICD-10-CM | POA: Diagnosis not present

## 2017-08-10 DIAGNOSIS — J45909 Unspecified asthma, uncomplicated: Secondary | ICD-10-CM | POA: Diagnosis present

## 2017-08-10 DIAGNOSIS — R079 Chest pain, unspecified: Secondary | ICD-10-CM | POA: Diagnosis not present

## 2017-08-10 DIAGNOSIS — I959 Hypotension, unspecified: Secondary | ICD-10-CM | POA: Diagnosis present

## 2017-08-10 DIAGNOSIS — I10 Essential (primary) hypertension: Secondary | ICD-10-CM | POA: Diagnosis not present

## 2017-08-10 DIAGNOSIS — T2125XA Burn of second degree of buttock, initial encounter: Secondary | ICD-10-CM | POA: Diagnosis present

## 2017-08-10 DIAGNOSIS — R0689 Other abnormalities of breathing: Secondary | ICD-10-CM | POA: Diagnosis not present

## 2017-08-10 DIAGNOSIS — J90 Pleural effusion, not elsewhere classified: Secondary | ICD-10-CM

## 2017-08-10 DIAGNOSIS — S82101A Unspecified fracture of upper end of right tibia, initial encounter for closed fracture: Secondary | ICD-10-CM

## 2017-08-10 DIAGNOSIS — S0990XA Unspecified injury of head, initial encounter: Secondary | ICD-10-CM | POA: Diagnosis not present

## 2017-08-10 DIAGNOSIS — M25571 Pain in right ankle and joints of right foot: Secondary | ICD-10-CM | POA: Diagnosis not present

## 2017-08-10 DIAGNOSIS — F419 Anxiety disorder, unspecified: Secondary | ICD-10-CM | POA: Diagnosis present

## 2017-08-10 DIAGNOSIS — M79661 Pain in right lower leg: Secondary | ICD-10-CM | POA: Diagnosis not present

## 2017-08-10 DIAGNOSIS — S82002A Unspecified fracture of left patella, initial encounter for closed fracture: Secondary | ICD-10-CM | POA: Diagnosis not present

## 2017-08-10 HISTORY — DX: Presence of prosthetic heart valve: Z95.2

## 2017-08-10 HISTORY — DX: Unspecified asthma, uncomplicated: J45.909

## 2017-08-10 HISTORY — DX: Essential (primary) hypertension: I10

## 2017-08-10 LAB — I-STAT CHEM 8, ED
BUN: 21 mg/dL — AB (ref 6–20)
CREATININE: 1 mg/dL (ref 0.61–1.24)
Calcium, Ion: 1.08 mmol/L — ABNORMAL LOW (ref 1.15–1.40)
Chloride: 100 mmol/L — ABNORMAL LOW (ref 101–111)
Glucose, Bld: 139 mg/dL — ABNORMAL HIGH (ref 65–99)
HEMATOCRIT: 47 % (ref 39.0–52.0)
Hemoglobin: 16 g/dL (ref 13.0–17.0)
Potassium: 3.6 mmol/L (ref 3.5–5.1)
Sodium: 138 mmol/L (ref 135–145)
TCO2: 28 mmol/L (ref 22–32)

## 2017-08-10 LAB — CBC
HCT: 44.9 % (ref 39.0–52.0)
HEMOGLOBIN: 15 g/dL (ref 13.0–17.0)
MCH: 29.9 pg (ref 26.0–34.0)
MCHC: 33.4 g/dL (ref 30.0–36.0)
MCV: 89.6 fL (ref 78.0–100.0)
Platelets: 182 10*3/uL (ref 150–400)
RBC: 5.01 MIL/uL (ref 4.22–5.81)
RDW: 13.8 % (ref 11.5–15.5)
WBC: 15.4 10*3/uL — ABNORMAL HIGH (ref 4.0–10.5)

## 2017-08-10 LAB — ETHANOL

## 2017-08-10 LAB — SAMPLE TO BLOOD BANK

## 2017-08-10 LAB — I-STAT CG4 LACTIC ACID, ED: Lactic Acid, Venous: 4.15 mmol/L (ref 0.5–1.9)

## 2017-08-10 LAB — CDS SEROLOGY

## 2017-08-10 LAB — PROTIME-INR
INR: 3.08
Prothrombin Time: 31.6 seconds — ABNORMAL HIGH (ref 11.4–15.2)

## 2017-08-10 MED ORDER — SODIUM CHLORIDE 0.9 % IV SOLN: INTRAVENOUS | Status: DC

## 2017-08-10 MED ORDER — ONDANSETRON HCL 4 MG/2ML IJ SOLN
INTRAMUSCULAR | Status: AC
Start: 2017-08-10 — End: 2017-08-11
  Filled 2017-08-10: qty 2

## 2017-08-10 MED ORDER — SODIUM CHLORIDE 0.9 % IV BOLUS
1000.0000 mL | Freq: Once | INTRAVENOUS | Status: AC
  Administered 2017-08-10: 1000 mL via INTRAVENOUS

## 2017-08-10 MED ORDER — SODIUM CHLORIDE 0.9 % IV BOLUS: 1000.0000 mL | Freq: Once | INTRAVENOUS | Status: DC

## 2017-08-10 MED ORDER — FENTANYL CITRATE (PF) 100 MCG/2ML IJ SOLN
100.0000 ug | Freq: Once | INTRAMUSCULAR | Status: AC
  Administered 2017-08-10: 100 ug via INTRAVENOUS

## 2017-08-10 MED ORDER — FENTANYL CITRATE (PF) 100 MCG/2ML IJ SOLN
INTRAMUSCULAR | Status: AC
  Filled 2017-08-10: qty 2

## 2017-08-10 MED ORDER — SODIUM CHLORIDE 0.9 % IV BOLUS: 125.0000 mL | Freq: Once | INTRAVENOUS | Status: DC

## 2017-08-10 MED ORDER — ONDANSETRON HCL 4 MG/2ML IJ SOLN
4.0000 mg | Freq: Once | INTRAMUSCULAR | Status: AC
  Administered 2017-08-10: 4 mg via INTRAVENOUS

## 2017-08-10 NOTE — Progress Notes (Signed)
   08/10/17 2300  Clinical Encounter Type  Visited With Patient  Visit Type Trauma  Referral From Nurse  Consult/Referral To Chaplain  Stress Factors  Patient Stress Factors Health changes  Chaplain present at Trauma, Pt was being wheeled to Xray and Ct at the time but did not have need at the time.

## 2017-08-10 NOTE — Progress Notes (Signed)
Airway is patent
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
## 2017-08-10 NOTE — Progress Notes (Signed)
Orthopedic Tech Progress Note Patient Details:  Corey Mathis 17-Jan-1951 [id number]  Ortho Devices Type of Ortho Device: Knee Immobilizer Ortho Device/Splint Location: rle. applied at drs request. Ortho Device/Splint Interventions: Ordered, Application, Adjustment   Post Interventions Patient Tolerated: Well Instructions Provided: Care of device, Adjustment of device   Karolee Stamps 08/10/2017, 11:22 PM

## 2017-08-10 NOTE — ED Notes (Signed)
To xray and CT

## 2017-08-11 ENCOUNTER — Inpatient Hospital Stay (HOSPITAL_COMMUNITY): Payer: Medicare Other

## 2017-08-11 ENCOUNTER — Encounter (HOSPITAL_COMMUNITY): Payer: Self-pay | Admitting: Emergency Medicine

## 2017-08-11 ENCOUNTER — Emergency Department (HOSPITAL_COMMUNITY): Payer: Medicare Other

## 2017-08-11 ENCOUNTER — Encounter (HOSPITAL_COMMUNITY): Admission: EM | Disposition: A | Discharge status: Skilled Nursing Facility | Payer: Self-pay | Source: Home / Self Care

## 2017-08-11 ENCOUNTER — Inpatient Hospital Stay (HOSPITAL_COMMUNITY): Payer: Medicare Other | Admitting: Anesthesiology

## 2017-08-11 DIAGNOSIS — M25532 Pain in left wrist: Secondary | ICD-10-CM | POA: Diagnosis not present

## 2017-08-11 DIAGNOSIS — I110 Hypertensive heart disease with heart failure: Secondary | ICD-10-CM | POA: Diagnosis present

## 2017-08-11 DIAGNOSIS — S82252A Displaced comminuted fracture of shaft of left tibia, initial encounter for closed fracture: Secondary | ICD-10-CM | POA: Diagnosis not present

## 2017-08-11 DIAGNOSIS — S0990XA Unspecified injury of head, initial encounter: Secondary | ICD-10-CM | POA: Diagnosis not present

## 2017-08-11 DIAGNOSIS — R279 Unspecified lack of coordination: Secondary | ICD-10-CM | POA: Diagnosis not present

## 2017-08-11 DIAGNOSIS — M25572 Pain in left ankle and joints of left foot: Secondary | ICD-10-CM | POA: Diagnosis not present

## 2017-08-11 DIAGNOSIS — S299XXA Unspecified injury of thorax, initial encounter: Secondary | ICD-10-CM | POA: Diagnosis not present

## 2017-08-11 DIAGNOSIS — M25061 Hemarthrosis, right knee: Secondary | ICD-10-CM | POA: Diagnosis present

## 2017-08-11 DIAGNOSIS — M25561 Pain in right knee: Secondary | ICD-10-CM | POA: Diagnosis not present

## 2017-08-11 DIAGNOSIS — K5900 Constipation, unspecified: Secondary | ICD-10-CM | POA: Diagnosis not present

## 2017-08-11 DIAGNOSIS — K228 Other specified diseases of esophagus: Secondary | ICD-10-CM | POA: Diagnosis not present

## 2017-08-11 DIAGNOSIS — X088XXA Exposure to other specified smoke, fire and flames, initial encounter: Secondary | ICD-10-CM | POA: Diagnosis present

## 2017-08-11 DIAGNOSIS — J942 Hemothorax: Secondary | ICD-10-CM | POA: Diagnosis not present

## 2017-08-11 DIAGNOSIS — S00212A Abrasion of left eyelid and periocular area, initial encounter: Secondary | ICD-10-CM | POA: Diagnosis present

## 2017-08-11 DIAGNOSIS — D62 Acute posthemorrhagic anemia: Secondary | ICD-10-CM | POA: Diagnosis not present

## 2017-08-11 DIAGNOSIS — S82001A Unspecified fracture of right patella, initial encounter for closed fracture: Secondary | ICD-10-CM | POA: Diagnosis not present

## 2017-08-11 DIAGNOSIS — S82132A Displaced fracture of medial condyle of left tibia, initial encounter for closed fracture: Secondary | ICD-10-CM | POA: Diagnosis not present

## 2017-08-11 DIAGNOSIS — S82202A Unspecified fracture of shaft of left tibia, initial encounter for closed fracture: Secondary | ICD-10-CM | POA: Diagnosis not present

## 2017-08-11 DIAGNOSIS — M25062 Hemarthrosis, left knee: Secondary | ICD-10-CM | POA: Diagnosis present

## 2017-08-11 DIAGNOSIS — M79661 Pain in right lower leg: Secondary | ICD-10-CM | POA: Diagnosis not present

## 2017-08-11 DIAGNOSIS — S82831A Other fracture of upper and lower end of right fibula, initial encounter for closed fracture: Secondary | ICD-10-CM | POA: Diagnosis present

## 2017-08-11 DIAGNOSIS — Y92410 Unspecified street and highway as the place of occurrence of the external cause: Secondary | ICD-10-CM | POA: Diagnosis not present

## 2017-08-11 DIAGNOSIS — Z01810 Encounter for preprocedural cardiovascular examination: Secondary | ICD-10-CM | POA: Diagnosis not present

## 2017-08-11 DIAGNOSIS — J45909 Unspecified asthma, uncomplicated: Secondary | ICD-10-CM | POA: Diagnosis not present

## 2017-08-11 DIAGNOSIS — J90 Pleural effusion, not elsewhere classified: Secondary | ICD-10-CM | POA: Diagnosis not present

## 2017-08-11 DIAGNOSIS — S82142S Displaced bicondylar fracture of left tibia, sequela: Secondary | ICD-10-CM | POA: Diagnosis not present

## 2017-08-11 DIAGNOSIS — I428 Other cardiomyopathies: Secondary | ICD-10-CM | POA: Diagnosis present

## 2017-08-11 DIAGNOSIS — S6992XA Unspecified injury of left wrist, hand and finger(s), initial encounter: Secondary | ICD-10-CM | POA: Diagnosis not present

## 2017-08-11 DIAGNOSIS — M792 Neuralgia and neuritis, unspecified: Secondary | ICD-10-CM | POA: Diagnosis not present

## 2017-08-11 DIAGNOSIS — I2510 Atherosclerotic heart disease of native coronary artery without angina pectoris: Secondary | ICD-10-CM | POA: Diagnosis present

## 2017-08-11 DIAGNOSIS — M25571 Pain in right ankle and joints of right foot: Secondary | ICD-10-CM | POA: Diagnosis not present

## 2017-08-11 DIAGNOSIS — F419 Anxiety disorder, unspecified: Secondary | ICD-10-CM | POA: Diagnosis present

## 2017-08-11 DIAGNOSIS — I959 Hypotension, unspecified: Secondary | ICD-10-CM | POA: Diagnosis present

## 2017-08-11 DIAGNOSIS — S82101A Unspecified fracture of upper end of right tibia, initial encounter for closed fracture: Secondary | ICD-10-CM | POA: Diagnosis not present

## 2017-08-11 DIAGNOSIS — M6281 Muscle weakness (generalized): Secondary | ICD-10-CM | POA: Diagnosis not present

## 2017-08-11 DIAGNOSIS — F411 Generalized anxiety disorder: Secondary | ICD-10-CM | POA: Diagnosis not present

## 2017-08-11 DIAGNOSIS — K661 Hemoperitoneum: Secondary | ICD-10-CM | POA: Diagnosis not present

## 2017-08-11 DIAGNOSIS — J309 Allergic rhinitis, unspecified: Secondary | ICD-10-CM | POA: Diagnosis not present

## 2017-08-11 DIAGNOSIS — Z4789 Encounter for other orthopedic aftercare: Secondary | ICD-10-CM | POA: Diagnosis not present

## 2017-08-11 DIAGNOSIS — S99912A Unspecified injury of left ankle, initial encounter: Secondary | ICD-10-CM | POA: Diagnosis not present

## 2017-08-11 DIAGNOSIS — Z954 Presence of other heart-valve replacement: Secondary | ICD-10-CM | POA: Diagnosis not present

## 2017-08-11 DIAGNOSIS — B9620 Unspecified Escherichia coli [E. coli] as the cause of diseases classified elsewhere: Secondary | ICD-10-CM | POA: Diagnosis present

## 2017-08-11 DIAGNOSIS — S301XXA Contusion of abdominal wall, initial encounter: Secondary | ICD-10-CM | POA: Diagnosis present

## 2017-08-11 DIAGNOSIS — S82109S Unspecified fracture of upper end of unspecified tibia, sequela: Secondary | ICD-10-CM | POA: Diagnosis not present

## 2017-08-11 DIAGNOSIS — Z952 Presence of prosthetic heart valve: Secondary | ICD-10-CM | POA: Diagnosis not present

## 2017-08-11 DIAGNOSIS — S82002A Unspecified fracture of left patella, initial encounter for closed fracture: Secondary | ICD-10-CM | POA: Diagnosis not present

## 2017-08-11 DIAGNOSIS — R262 Difficulty in walking, not elsewhere classified: Secondary | ICD-10-CM | POA: Diagnosis not present

## 2017-08-11 DIAGNOSIS — R0602 Shortness of breath: Secondary | ICD-10-CM | POA: Diagnosis not present

## 2017-08-11 DIAGNOSIS — S8991XA Unspecified injury of right lower leg, initial encounter: Secondary | ICD-10-CM | POA: Diagnosis not present

## 2017-08-11 DIAGNOSIS — S199XXA Unspecified injury of neck, initial encounter: Secondary | ICD-10-CM | POA: Diagnosis not present

## 2017-08-11 DIAGNOSIS — S82141S Displaced bicondylar fracture of right tibia, sequela: Secondary | ICD-10-CM | POA: Diagnosis not present

## 2017-08-11 DIAGNOSIS — S32020A Wedge compression fracture of second lumbar vertebra, initial encounter for closed fracture: Secondary | ICD-10-CM | POA: Diagnosis not present

## 2017-08-11 DIAGNOSIS — S82142A Displaced bicondylar fracture of left tibia, initial encounter for closed fracture: Secondary | ICD-10-CM

## 2017-08-11 DIAGNOSIS — S0993XA Unspecified injury of face, initial encounter: Secondary | ICD-10-CM | POA: Diagnosis not present

## 2017-08-11 DIAGNOSIS — S82141A Displaced bicondylar fracture of right tibia, initial encounter for closed fracture: Secondary | ICD-10-CM

## 2017-08-11 DIAGNOSIS — R609 Edema, unspecified: Secondary | ICD-10-CM | POA: Diagnosis not present

## 2017-08-11 DIAGNOSIS — T2125XA Burn of second degree of buttock, initial encounter: Secondary | ICD-10-CM | POA: Diagnosis present

## 2017-08-11 DIAGNOSIS — E785 Hyperlipidemia, unspecified: Secondary | ICD-10-CM | POA: Diagnosis not present

## 2017-08-11 DIAGNOSIS — Z743 Need for continuous supervision: Secondary | ICD-10-CM | POA: Diagnosis not present

## 2017-08-11 DIAGNOSIS — S82201A Unspecified fracture of shaft of right tibia, initial encounter for closed fracture: Secondary | ICD-10-CM | POA: Diagnosis not present

## 2017-08-11 DIAGNOSIS — S27321A Contusion of lung, unilateral, initial encounter: Secondary | ICD-10-CM | POA: Diagnosis not present

## 2017-08-11 DIAGNOSIS — S32000S Wedge compression fracture of unspecified lumbar vertebra, sequela: Secondary | ICD-10-CM | POA: Diagnosis not present

## 2017-08-11 DIAGNOSIS — S32040A Wedge compression fracture of fourth lumbar vertebra, initial encounter for closed fracture: Secondary | ICD-10-CM | POA: Diagnosis not present

## 2017-08-11 DIAGNOSIS — S8391XA Sprain of unspecified site of right knee, initial encounter: Secondary | ICD-10-CM | POA: Diagnosis not present

## 2017-08-11 DIAGNOSIS — S32029A Unspecified fracture of second lumbar vertebra, initial encounter for closed fracture: Secondary | ICD-10-CM | POA: Diagnosis present

## 2017-08-11 DIAGNOSIS — I5022 Chronic systolic (congestive) heart failure: Secondary | ICD-10-CM | POA: Diagnosis present

## 2017-08-11 DIAGNOSIS — S36899A Unspecified injury of other intra-abdominal organs, initial encounter: Secondary | ICD-10-CM | POA: Diagnosis present

## 2017-08-11 DIAGNOSIS — S82251A Displaced comminuted fracture of shaft of right tibia, initial encounter for closed fracture: Secondary | ICD-10-CM | POA: Diagnosis not present

## 2017-08-11 DIAGNOSIS — M2550 Pain in unspecified joint: Secondary | ICD-10-CM | POA: Diagnosis not present

## 2017-08-11 DIAGNOSIS — N390 Urinary tract infection, site not specified: Secondary | ICD-10-CM | POA: Diagnosis present

## 2017-08-11 DIAGNOSIS — S32049A Unspecified fracture of fourth lumbar vertebra, initial encounter for closed fracture: Secondary | ICD-10-CM | POA: Diagnosis present

## 2017-08-11 DIAGNOSIS — M25562 Pain in left knee: Secondary | ICD-10-CM | POA: Diagnosis not present

## 2017-08-11 DIAGNOSIS — S271XXA Traumatic hemothorax, initial encounter: Secondary | ICD-10-CM | POA: Diagnosis present

## 2017-08-11 DIAGNOSIS — S3991XA Unspecified injury of abdomen, initial encounter: Secondary | ICD-10-CM | POA: Diagnosis not present

## 2017-08-11 DIAGNOSIS — D649 Anemia, unspecified: Secondary | ICD-10-CM | POA: Diagnosis not present

## 2017-08-11 DIAGNOSIS — R0902 Hypoxemia: Secondary | ICD-10-CM | POA: Diagnosis present

## 2017-08-11 DIAGNOSIS — I517 Cardiomegaly: Secondary | ICD-10-CM | POA: Diagnosis not present

## 2017-08-11 DIAGNOSIS — H5789 Other specified disorders of eye and adnexa: Secondary | ICD-10-CM | POA: Diagnosis not present

## 2017-08-11 DIAGNOSIS — I10 Essential (primary) hypertension: Secondary | ICD-10-CM | POA: Diagnosis not present

## 2017-08-11 HISTORY — PX: FINE NEEDLE ASPIRATION: SHX6590

## 2017-08-11 HISTORY — PX: EXTERNAL FIXATION LEG: SHX1549

## 2017-08-11 LAB — TROPONIN I
TROPONIN I: 0.09 ng/mL — AB (ref <0.03)
TROPONIN I: 0.31 ng/mL — AB (ref <0.03)

## 2017-08-11 LAB — CBC
HCT: 34.9 % — ABNORMAL LOW (ref 39.0–52.0)
HCT: 30.6 % — ABNORMAL LOW (ref 39.0–52.0)
HEMATOCRIT: 34.4 % — AB (ref 39.0–52.0)
HEMATOCRIT: 34.6 % — AB (ref 39.0–52.0)
HEMOGLOBIN: 11.4 g/dL — AB (ref 13.0–17.0)
HEMOGLOBIN: 11.3 g/dL — AB (ref 13.0–17.0)
Hemoglobin: 11.3 g/dL — ABNORMAL LOW (ref 13.0–17.0)
Hemoglobin: 10.3 g/dL — ABNORMAL LOW (ref 13.0–17.0)
MCH: 30.2 pg (ref 26.0–34.0)
MCH: 30.1 pg (ref 26.0–34.0)
MCH: 30.2 pg (ref 26.0–34.0)
MCH: 30.2 pg (ref 26.0–34.0)
MCHC: 32.7 g/dL (ref 30.0–36.0)
MCHC: 32.8 g/dL (ref 30.0–36.0)
MCHC: 32.7 g/dL (ref 30.0–36.0)
MCHC: 33.7 g/dL (ref 30.0–36.0)
MCV: 92.6 fL (ref 78.0–100.0)
MCV: 91.5 fL (ref 78.0–100.0)
MCV: 92.5 fL (ref 78.0–100.0)
MCV: 89.7 fL (ref 78.0–100.0)
PLATELETS: 86 10*3/uL — AB (ref 150–400)
Platelets: 121 10*3/uL — ABNORMAL LOW (ref 150–400)
Platelets: 83 10*3/uL — ABNORMAL LOW (ref 150–400)
Platelets: 87 10*3/uL — ABNORMAL LOW (ref 150–400)
RBC: 3.77 MIL/uL — AB (ref 4.22–5.81)
RBC: 3.76 MIL/uL — ABNORMAL LOW (ref 4.22–5.81)
RBC: 3.74 MIL/uL — ABNORMAL LOW (ref 4.22–5.81)
RBC: 3.41 MIL/uL — ABNORMAL LOW (ref 4.22–5.81)
RDW: 14.3 % (ref 11.5–15.5)
RDW: 14.3 % (ref 11.5–15.5)
RDW: 14.6 % (ref 11.5–15.5)
RDW: 14.6 % (ref 11.5–15.5)
WBC: 13 10*3/uL — ABNORMAL HIGH (ref 4.0–10.5)
WBC: 10.3 10*3/uL (ref 4.0–10.5)
WBC: 8.8 10*3/uL (ref 4.0–10.5)
WBC: 9.2 10*3/uL (ref 4.0–10.5)

## 2017-08-11 LAB — URINALYSIS, ROUTINE W REFLEX MICROSCOPIC
BACTERIA UA: NONE SEEN
BILIRUBIN URINE: NEGATIVE
Glucose, UA: NEGATIVE mg/dL
KETONES UR: NEGATIVE mg/dL
Nitrite: NEGATIVE
PROTEIN: NEGATIVE mg/dL
Specific Gravity, Urine: 1.027 (ref 1.005–1.030)
pH: 5 (ref 5.0–8.0)

## 2017-08-11 LAB — COMPREHENSIVE METABOLIC PANEL
ALBUMIN: 3.9 g/dL (ref 3.5–5.0)
ALK PHOS: 72 U/L (ref 38–126)
ALT: 57 U/L (ref 17–63)
ANION GAP: 14 (ref 5–15)
AST: 111 U/L — AB (ref 15–41)
BILIRUBIN TOTAL: 1 mg/dL (ref 0.3–1.2)
BUN: 17 mg/dL (ref 6–20)
CO2: 23 mmol/L (ref 22–32)
Calcium: 8.8 mg/dL — ABNORMAL LOW (ref 8.9–10.3)
Chloride: 101 mmol/L (ref 101–111)
Creatinine, Ser: 1.18 mg/dL (ref 0.61–1.24)
GFR calc Af Amer: >60 mL/min (ref >60)
GFR calc non Af Amer: >60 mL/min (ref >60)
GLUCOSE: 154 mg/dL — AB (ref 65–99)
POTASSIUM: 3.7 mmol/L (ref 3.5–5.1)
Sodium: 138 mmol/L (ref 135–145)
Total Protein: 6.8 g/dL (ref 6.5–8.1)

## 2017-08-11 LAB — PROTIME-INR
INR: 1.44
INR: 1.34
INR: 1.19
Prothrombin Time: 17.4 seconds — ABNORMAL HIGH (ref 11.4–15.2)
Prothrombin Time: 16.4 seconds — ABNORMAL HIGH (ref 11.4–15.2)
Prothrombin Time: 15 seconds (ref 11.4–15.2)

## 2017-08-11 LAB — PREPARE RBC (CROSSMATCH)

## 2017-08-11 LAB — LACTIC ACID, PLASMA
LACTIC ACID, VENOUS: 5 mmol/L — AB (ref 0.5–1.9)
Lactic Acid, Venous: 4.8 mmol/L (ref 0.5–1.9)

## 2017-08-11 LAB — SURGICAL PCR SCREEN
MRSA, PCR: POSITIVE — AB
Staphylococcus aureus: POSITIVE — AB

## 2017-08-11 LAB — ABO/RH: ABO/RH(D): O POS

## 2017-08-11 LAB — GLUCOSE, CAPILLARY: Glucose-Capillary: 175 mg/dL — ABNORMAL HIGH (ref 65–99)

## 2017-08-11 SURGERY — EXTERNAL FIXATION, LOWER EXTREMITY
Anesthesia: General | Site: Knee | Laterality: Right

## 2017-08-11 MED ORDER — ROCURONIUM BROMIDE 10 MG/ML (PF) SYRINGE
PREFILLED_SYRINGE | INTRAVENOUS | Status: AC
  Filled 2017-08-11: qty 5

## 2017-08-11 MED ORDER — ONDANSETRON HCL 4 MG/2ML IJ SOLN
4.0000 mg | Freq: Four times a day (QID) | INTRAMUSCULAR | Status: DC | PRN
  Administered 2017-08-17 – 2017-08-31 (×3): 4 mg via INTRAVENOUS
  Filled 2017-08-11 (×4): qty 2

## 2017-08-11 MED ORDER — HYDROMORPHONE HCL 2 MG/ML IJ SOLN
0.5000 mg | Freq: Once | INTRAMUSCULAR | Status: AC
  Administered 2017-08-11: 0.5 mg via INTRAVENOUS

## 2017-08-11 MED ORDER — FENTANYL CITRATE (PF) 250 MCG/5ML IJ SOLN
INTRAMUSCULAR | Status: DC | PRN
  Administered 2017-08-11: 100 ug via INTRAVENOUS

## 2017-08-11 MED ORDER — IOHEXOL 300 MG/ML  SOLN
100.0000 mL | Freq: Once | INTRAMUSCULAR | Status: AC | PRN
  Administered 2017-08-11: 100 mL via INTRAVENOUS

## 2017-08-11 MED ORDER — SODIUM CHLORIDE 0.9 % IV BOLUS
1000.0000 mL | Freq: Once | INTRAVENOUS | Status: AC
  Administered 2017-08-11: 1000 mL via INTRAVENOUS

## 2017-08-11 MED ORDER — CEFAZOLIN SODIUM-DEXTROSE 2-4 GM/100ML-% IV SOLN
2.0000 g | Freq: Once | INTRAVENOUS | Status: AC
  Administered 2017-08-11: 2 g via INTRAVENOUS
  Filled 2017-08-11: qty 100

## 2017-08-11 MED ORDER — MUPIROCIN 2 % EX OINT
1.0000 "application " | TOPICAL_OINTMENT | Freq: Two times a day (BID) | CUTANEOUS | Status: AC
  Administered 2017-08-11 – 2017-08-16 (×10): 1 via NASAL
  Filled 2017-08-11: qty 22

## 2017-08-11 MED ORDER — PROPOFOL 10 MG/ML IV BOLUS
INTRAVENOUS | Status: DC | PRN
  Administered 2017-08-11: 100 mg via INTRAVENOUS

## 2017-08-11 MED ORDER — MIDAZOLAM HCL 2 MG/2ML IJ SOLN
INTRAMUSCULAR | Status: DC | PRN
  Administered 2017-08-11: 1 mg via INTRAVENOUS

## 2017-08-11 MED ORDER — LIDOCAINE 2% (20 MG/ML) 5 ML SYRINGE
INTRAMUSCULAR | Status: DC | PRN
  Administered 2017-08-11: 100 mg via INTRAVENOUS

## 2017-08-11 MED ORDER — METOPROLOL TARTRATE 5 MG/5ML IV SOLN: 5.0000 mg | Freq: Four times a day (QID) | INTRAVENOUS | Status: DC | PRN

## 2017-08-11 MED ORDER — VANCOMYCIN HCL IN DEXTROSE 1-5 GM/200ML-% IV SOLN
INTRAVENOUS | Status: AC
  Filled 2017-08-11: qty 200

## 2017-08-11 MED ORDER — SODIUM CHLORIDE 0.9 % IV SOLN
Freq: Once | INTRAVENOUS | Status: AC
Start: 2017-08-11 — End: 2017-08-11
  Administered 2017-08-11: 02:00:00 via INTRAVENOUS

## 2017-08-11 MED ORDER — HYDROMORPHONE HCL 2 MG/ML IJ SOLN: 0.2500 mg | INTRAMUSCULAR | Status: DC | PRN

## 2017-08-11 MED ORDER — MIDAZOLAM HCL 2 MG/2ML IJ SOLN
INTRAMUSCULAR | Status: AC
  Filled 2017-08-11: qty 2

## 2017-08-11 MED ORDER — PHENYLEPHRINE 40 MCG/ML (10ML) SYRINGE FOR IV PUSH (FOR BLOOD PRESSURE SUPPORT)
PREFILLED_SYRINGE | INTRAVENOUS | Status: DC | PRN
  Administered 2017-08-11 (×2): 80 ug via INTRAVENOUS

## 2017-08-11 MED ORDER — SODIUM CHLORIDE 0.9 % IR SOLN
Status: DC | PRN
  Administered 2017-08-11: 1000 mL

## 2017-08-11 MED ORDER — SUGAMMADEX SODIUM 200 MG/2ML IV SOLN
INTRAVENOUS | Status: AC
  Filled 2017-08-11: qty 2

## 2017-08-11 MED ORDER — PROMETHAZINE HCL 25 MG/ML IJ SOLN: 6.2500 mg | INTRAMUSCULAR | Status: DC | PRN

## 2017-08-11 MED ORDER — SODIUM CHLORIDE 0.9 % IV SOLN: 10.0000 mL/h | Freq: Once | INTRAVENOUS | Status: DC

## 2017-08-11 MED ORDER — MORPHINE SULFATE (PF) 2 MG/ML IV SOLN
2.0000 mg | INTRAVENOUS | Status: DC | PRN
  Administered 2017-08-11 – 2017-08-12 (×7): 2 mg via INTRAVENOUS
  Administered 2017-08-12: 4 mg via INTRAVENOUS
  Administered 2017-08-12: 2 mg via INTRAVENOUS
  Administered 2017-08-12: 4 mg via INTRAVENOUS
  Administered 2017-08-12: 2 mg via INTRAVENOUS
  Administered 2017-08-12: 4 mg via INTRAVENOUS
  Administered 2017-08-13: 2 mg via INTRAVENOUS
  Administered 2017-08-13: 4 mg via INTRAVENOUS
  Administered 2017-08-13 – 2017-08-14 (×4): 2 mg via INTRAVENOUS
  Administered 2017-08-14: 4 mg via INTRAVENOUS
  Administered 2017-08-14 (×3): 2 mg via INTRAVENOUS
  Administered 2017-08-15: 4 mg via INTRAVENOUS
  Administered 2017-08-15 (×2): 2 mg via INTRAVENOUS
  Administered 2017-08-15: 4 mg via INTRAVENOUS
  Administered 2017-08-15: 2 mg via INTRAVENOUS
  Administered 2017-08-15 (×3): 4 mg via INTRAVENOUS
  Administered 2017-08-16: 2 mg via INTRAVENOUS
  Administered 2017-08-16 (×2): 4 mg via INTRAVENOUS
  Administered 2017-08-16: 2 mg via INTRAVENOUS
  Administered 2017-08-16 – 2017-08-17 (×5): 4 mg via INTRAVENOUS
  Administered 2017-08-17: 2 mg via INTRAVENOUS
  Filled 2017-08-11: qty 2
  Filled 2017-08-11 (×2): qty 1
  Filled 2017-08-11: qty 2
  Filled 2017-08-11: qty 1
  Filled 2017-08-11 (×3): qty 2
  Filled 2017-08-11 (×5): qty 1
  Filled 2017-08-11 (×2): qty 2
  Filled 2017-08-11 (×2): qty 1
  Filled 2017-08-11: qty 2
  Filled 2017-08-11 (×4): qty 1
  Filled 2017-08-11: qty 2
  Filled 2017-08-11: qty 1
  Filled 2017-08-11: qty 2
  Filled 2017-08-11 (×2): qty 1
  Filled 2017-08-11: qty 2
  Filled 2017-08-11: qty 1
  Filled 2017-08-11: qty 2
  Filled 2017-08-11: qty 1
  Filled 2017-08-11: qty 2
  Filled 2017-08-11 (×4): qty 1
  Filled 2017-08-11 (×2): qty 2
  Filled 2017-08-11 (×5): qty 1

## 2017-08-11 MED ORDER — VITAMIN K1 10 MG/ML IJ SOLN
10.0000 mg | Freq: Once | INTRAVENOUS | Status: AC
  Administered 2017-08-11: 10 mg via INTRAVENOUS
  Filled 2017-08-11: qty 1

## 2017-08-11 MED ORDER — PHENYLEPHRINE HCL 10 MG/ML IJ SOLN
INTRAVENOUS | Status: DC | PRN
  Administered 2017-08-11: 25 ug/min via INTRAVENOUS

## 2017-08-11 MED ORDER — MOMETASONE FURO-FORMOTEROL FUM 100-5 MCG/ACT IN AERO
2.0000 | INHALATION_SPRAY | Freq: Two times a day (BID) | RESPIRATORY_TRACT | Status: DC
  Administered 2017-08-11 – 2017-08-31 (×38): 2 via RESPIRATORY_TRACT
  Filled 2017-08-11 (×2): qty 8.8

## 2017-08-11 MED ORDER — VANCOMYCIN HCL IN DEXTROSE 1-5 GM/200ML-% IV SOLN
1000.0000 mg | Freq: Once | INTRAVENOUS | Status: AC
  Administered 2017-08-11: 1000 mg via INTRAVENOUS

## 2017-08-11 MED ORDER — ROCURONIUM BROMIDE 10 MG/ML (PF) SYRINGE
PREFILLED_SYRINGE | INTRAVENOUS | Status: DC | PRN
  Administered 2017-08-11: 40 mg via INTRAVENOUS
  Administered 2017-08-11 (×2): 20 mg via INTRAVENOUS

## 2017-08-11 MED ORDER — ONDANSETRON HCL 4 MG/2ML IJ SOLN
INTRAMUSCULAR | Status: AC
  Filled 2017-08-11: qty 2

## 2017-08-11 MED ORDER — SUGAMMADEX SODIUM 200 MG/2ML IV SOLN
INTRAVENOUS | Status: DC | PRN
  Administered 2017-08-11: 200 mg via INTRAVENOUS

## 2017-08-11 MED ORDER — PHENYLEPHRINE 40 MCG/ML (10ML) SYRINGE FOR IV PUSH (FOR BLOOD PRESSURE SUPPORT)
PREFILLED_SYRINGE | INTRAVENOUS | Status: AC
  Filled 2017-08-11: qty 10

## 2017-08-11 MED ORDER — MEPERIDINE HCL 50 MG/ML IJ SOLN: 6.2500 mg | INTRAMUSCULAR | Status: DC | PRN

## 2017-08-11 MED ORDER — HYDROMORPHONE HCL 2 MG/ML IJ SOLN
INTRAMUSCULAR | Status: AC
  Filled 2017-08-11: qty 1

## 2017-08-11 MED ORDER — DEXAMETHASONE SODIUM PHOSPHATE 10 MG/ML IJ SOLN
INTRAMUSCULAR | Status: DC | PRN
  Administered 2017-08-11: 10 mg via INTRAVENOUS

## 2017-08-11 MED ORDER — EPHEDRINE SULFATE 50 MG/ML IJ SOLN
INTRAMUSCULAR | Status: AC
  Filled 2017-08-11: qty 1

## 2017-08-11 MED ORDER — PROPOFOL 10 MG/ML IV BOLUS
INTRAVENOUS | Status: AC
  Filled 2017-08-11: qty 20

## 2017-08-11 MED ORDER — SODIUM CHLORIDE 0.9 % IV SOLN
INTRAVENOUS | Status: AC | PRN
  Administered 2017-08-11: 1000 mL via INTRAVENOUS

## 2017-08-11 MED ORDER — SODIUM CHLORIDE 0.9 % IV SOLN
INTRAVENOUS | Status: DC
  Administered 2017-08-11 – 2017-08-15 (×7): via INTRAVENOUS

## 2017-08-11 MED ORDER — SODIUM CHLORIDE 0.9 % IJ SOLN
INTRAMUSCULAR | Status: AC
  Filled 2017-08-11: qty 10

## 2017-08-11 MED ORDER — ONDANSETRON HCL 4 MG/2ML IJ SOLN
INTRAMUSCULAR | Status: DC | PRN
  Administered 2017-08-11: 4 mg via INTRAVENOUS

## 2017-08-11 MED ORDER — MORPHINE SULFATE (PF) 4 MG/ML IV SOLN
2.0000 mg | INTRAVENOUS | Status: DC | PRN
  Administered 2017-08-11 (×2): 2 mg via INTRAVENOUS
  Filled 2017-08-11: qty 1

## 2017-08-11 MED ORDER — ONDANSETRON 4 MG PO TBDP
4.0000 mg | ORAL_TABLET | Freq: Four times a day (QID) | ORAL | Status: DC | PRN
  Administered 2017-08-17 – 2017-08-31 (×7): 4 mg via ORAL
  Filled 2017-08-11 (×7): qty 1

## 2017-08-11 MED ORDER — MORPHINE SULFATE (PF) 4 MG/ML IV SOLN
INTRAVENOUS | Status: AC
  Administered 2017-08-11: 2 mg via INTRAVENOUS
  Filled 2017-08-11: qty 1

## 2017-08-11 MED ORDER — LACTATED RINGERS IV SOLN
INTRAVENOUS | Status: DC | PRN
  Administered 2017-08-11 (×2): via INTRAVENOUS

## 2017-08-11 MED ORDER — DEXAMETHASONE SODIUM PHOSPHATE 10 MG/ML IJ SOLN
INTRAMUSCULAR | Status: AC
  Filled 2017-08-11: qty 1

## 2017-08-11 MED ORDER — FENTANYL CITRATE (PF) 250 MCG/5ML IJ SOLN
INTRAMUSCULAR | Status: AC
  Filled 2017-08-11: qty 5

## 2017-08-11 MED ORDER — SODIUM CHLORIDE 0.9 % IV SOLN
Freq: Once | INTRAVENOUS | Status: DC
Start: 2017-08-11 — End: 2017-08-20

## 2017-08-11 MED ORDER — CHLORHEXIDINE GLUCONATE CLOTH 2 % EX PADS
6.0000 | MEDICATED_PAD | Freq: Every day | CUTANEOUS | Status: AC
  Administered 2017-08-12 – 2017-08-15 (×4): 6 via TOPICAL

## 2017-08-11 MED ORDER — PROTHROMBIN COMPLEX CONC HUMAN 500 UNITS IV KIT
1556.0000 [IU] | PACK | Freq: Once | Status: AC
  Administered 2017-08-11: 1556 [IU] via INTRAVENOUS
  Filled 2017-08-11: qty 556

## 2017-08-11 SURGICAL SUPPLY — 50 items
BANDAGE ACE 4X5 VEL STRL LF (GAUZE/BANDAGES/DRESSINGS) ×6 IMPLANT
BANDAGE ACE 6X5 VEL STRL LF (GAUZE/BANDAGES/DRESSINGS) ×2 IMPLANT
BAR EXFX 500X11 NS LF (EXFIX) ×4
BAR GLASS FIBER EXFX 11X500 (EXFIX) ×4 IMPLANT
BIT DRILL CANN MED FLUTE 4.0 (BIT) IMPLANT
BNDG COHESIVE 6X5 TAN STRL LF (GAUZE/BANDAGES/DRESSINGS) ×4 IMPLANT
BNDG GAUZE ELAST 4 BULKY (GAUZE/BANDAGES/DRESSINGS) ×8 IMPLANT
COVER SURGICAL LIGHT HANDLE (MISCELLANEOUS) ×4 IMPLANT
DRAPE C-ARM 42X72 X-RAY (DRAPES) IMPLANT
DRAPE C-ARMOR (DRAPES) ×4 IMPLANT
DRAPE U-SHAPE 47X51 STRL (DRAPES) ×4 IMPLANT
DRILL CANN 4.0MM (BIT) ×4
ELECT REM PT RETURN 9FT ADLT (ELECTROSURGICAL) ×4
ELECTRODE REM PT RTRN 9FT ADLT (ELECTROSURGICAL) ×2 IMPLANT
GAUZE SPONGE 4X4 12PLY STRL (GAUZE/BANDAGES/DRESSINGS) ×4 IMPLANT
GAUZE XEROFORM 1X8 LF (GAUZE/BANDAGES/DRESSINGS) ×2 IMPLANT
GAUZE XEROFORM 5X9 LF (GAUZE/BANDAGES/DRESSINGS) ×4 IMPLANT
GLOVE BIOGEL PI IND STRL 7.0 (GLOVE) ×2 IMPLANT
GLOVE BIOGEL PI INDICATOR 7.0 (GLOVE) ×2
GLOVE ECLIPSE 7.0 STRL STRAW (GLOVE) ×4 IMPLANT
GLOVE SKINSENSE NS SZ7.5 (GLOVE) ×4
GLOVE SKINSENSE STRL SZ7.5 (GLOVE) ×4 IMPLANT
GOWN STRL REIN XL XLG (GOWN DISPOSABLE) ×4 IMPLANT
HALF PIN 5.0X160 (EXFIX) ×4 IMPLANT
HANDPIECE INTERPULSE COAX TIP (DISPOSABLE)
KIT BASIN OR (CUSTOM PROCEDURE TRAY) ×4 IMPLANT
KIT TURNOVER KIT B (KITS) ×4 IMPLANT
NDL SPNL 18GX3.5 QUINCKE PK (NEEDLE) IMPLANT
NEEDLE 22X1 1/2 (OR ONLY) (NEEDLE) IMPLANT
NEEDLE SPNL 18GX3.5 QUINCKE PK (NEEDLE) ×4 IMPLANT
NS IRRIG 1000ML POUR BTL (IV SOLUTION) ×4 IMPLANT
PACK ORTHO EXTREMITY (CUSTOM PROCEDURE TRAY) ×4 IMPLANT
PAD ABD 8X10 STRL (GAUZE/BANDAGES/DRESSINGS) ×4 IMPLANT
PAD ARMBOARD 7.5X6 YLW CONV (MISCELLANEOUS) ×8 IMPLANT
PADDING CAST COTTON 6X4 STRL (CAST SUPPLIES) ×6 IMPLANT
PIN CLAMP 2BAR 75MM BLUE (EXFIX) ×4 IMPLANT
PIN HALF YELLOW 5X160X35 (EXFIX) ×4 IMPLANT
SET HNDPC FAN SPRY TIP SCT (DISPOSABLE) IMPLANT
SPONGE LAP 18X18 X RAY DECT (DISPOSABLE) ×2 IMPLANT
STAPLER VISISTAT 35W (STAPLE) IMPLANT
STOCKINETTE IMPERVIOUS LG (DRAPES) ×4 IMPLANT
SYR CONTROL 10ML LL (SYRINGE) IMPLANT
SYRINGE 60CC LL (MISCELLANEOUS) ×4 IMPLANT
TOWEL OR 17X24 6PK STRL BLUE (TOWEL DISPOSABLE) ×8 IMPLANT
TOWEL OR 17X26 10 PK STRL BLUE (TOWEL DISPOSABLE) ×8 IMPLANT
TUBE CONNECTING 12'X1/4 (SUCTIONS) ×1
TUBE CONNECTING 12X1/4 (SUCTIONS) ×3 IMPLANT
UNDERPAD 30X30 (UNDERPADS AND DIAPERS) ×4 IMPLANT
WATER STERILE IRR 1000ML POUR (IV SOLUTION) ×8 IMPLANT
YANKAUER SUCT BULB TIP NO VENT (SUCTIONS) ×6 IMPLANT
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
## 2017-08-11 NOTE — Progress Notes (Signed)
Orthopedic Tech Progress Note Patient Details:  Corey Mathis March 27, 1950 [id number] Brace completed by [employer]. Patient ID: Corey Mathis, male   DOB: May 20, 1950, 67 y.o.   MRN: [id number]   [name] 08/11/2017, 4:41 PM

## 2017-08-11 NOTE — Anesthesia Postprocedure Evaluation (Signed)
Anesthesia Post Note  Patient: Corey Mathis  Procedure(s) Performed: EXTERNAL FIXATION, RIGHT KNEE (Right ) FINE NEEDLE ASPIRATION  of right Knee with 80 cc of dark red fluid removed (Right Knee)     Patient location during evaluation: PACU Anesthesia Type: General Level of consciousness: sedated and patient cooperative Pain management: pain level controlled Vital Signs Assessment: post-procedure vital signs reviewed and stable Respiratory status: spontaneous breathing Cardiovascular status: stable Anesthetic complications: no    Last Vitals:  Vitals:   08/11/17 1300 08/11/17 1400  BP: 132/86 119/83  Pulse: 98 96  Resp: (!) 22 15  Temp: 37.6 C 37.5 C  SpO2: 97% 99%    Last Pain:  Vitals:   08/11/17 1536  TempSrc:   PainSc: Violet
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
## 2017-08-11 NOTE — Anesthesia Procedure Notes (Signed)
Procedure Name: Intubation Date/Time: 08/11/2017 9:13 AM Performed by: Bryson Corona, CRNA Pre-anesthesia Checklist: Patient identified, Emergency Drugs available, Suction available and Patient being monitored Patient Re-evaluated:Patient Re-evaluated prior to induction Oxygen Delivery Method: Circle System Utilized Preoxygenation: Pre-oxygenation with 100% oxygen Induction Type: IV induction Ventilation: Mask ventilation without difficulty Laryngoscope Size: Glidescope and 4 Grade View: Grade I Tube type: Oral Tube size: 7.0 mm Number of attempts: 1 Airway Equipment and Method: Rigid stylet and Video-laryngoscopy Placement Confirmation: ETT inserted through vocal cords under direct vision,  positive ETCO2 and breath sounds checked- equal and bilateral Secured at: 23 cm Tube secured with: Tape Dental Injury: Teeth and Oropharynx as per pre-operative assessment  Difficulty Due To: Difficult Airway- due to cervical collar and Difficult Airway- due to large tongue Comments: Glidescope used due to c-collar.
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
## 2017-08-11 NOTE — Progress Notes (Signed)
I have discussed findings with Dr. Wyvonnia Dusky and I will plan on taking the patient to the OR for external fixator placement of his right leg first thing in the morning.  He will later need definitive ORIF of bilateral tibial plateau fx as soft tissues allow.  Per Dr. Vevelyn Francois report, the patients leg compartments are soft.  Should there be any changes or concerns, he knows to notify me.  As long as his leg examine is benign, we will plan on ex fix of RLE in the morning.  CT scan has been ordered for the left knee.  I will obtain right knee CT scan once he has had placement of the ex fix.

## 2017-08-11 NOTE — Transfer of Care (Signed)
Immediate Anesthesia Transfer of Care Note  Patient: Corey Mathis  Procedure(s) Performed: EXTERNAL FIXATION, RIGHT KNEE (Right ) FINE NEEDLE ASPIRATION  of right Knee with 80 cc of dark red fluid removed (Right Knee)  Patient Location: PACU  Anesthesia Type:General  Level of Consciousness: awake and alert   Airway & Oxygen Therapy: Patient Spontanous Breathing and Patient connected to nasal cannula oxygen  Post-op Assessment: Report given to RN and Post -op Vital signs reviewed and stable  Post vital signs: Reviewed and stable  Last Vitals:  Vitals Value Taken Time  BP    Temp    Pulse 97 08/11/2017 10:30 AM  Resp 15 08/11/2017 10:30 AM  SpO2 96 % 08/11/2017 10:30 AM  Vitals shown include unvalidated device data.  Last Pain:  Vitals:   08/11/17 0801  TempSrc: Core (Comment)  PainSc:          Complications: No apparent anesthesia complications

## 2017-08-11 NOTE — ED Notes (Signed)
Patient taken to CT with RN.

## 2017-08-11 NOTE — Anesthesia Preprocedure Evaluation (Addendum)
Anesthesia Evaluation  Patient identified by MRN, date of birth, ID band Patient awake    Reviewed: Allergy & Precautions, NPO status , Patient's Chart, lab work & pertinent test results  Airway Mallampati: II  TM Distance: >3 FB Neck ROM: Limited    Dental  (+) Teeth Intact, Chipped Front two teeth chipped.:   Pulmonary asthma ,    Pulmonary exam normal breath sounds clear to auscultation       Cardiovascular hypertension, Pt. on medications Normal cardiovascular exam+ Valvular Problems/Murmurs  Rhythm:Regular Rate:Normal     Neuro/Psych negative neurological ROS  negative psych ROS   GI/Hepatic negative GI ROS, Neg liver ROS,   Endo/Other  negative endocrine ROS  Renal/GU negative Renal ROS     Musculoskeletal negative musculoskeletal ROS (+)   Abdominal   Peds  Hematology negative hematology ROS (+)   Anesthesia Other Findings   Reproductive/Obstetrics                           Anesthesia Physical Anesthesia Plan  ASA: III  Anesthesia Plan: General   Post-op Pain Management:    Induction: Intravenous  PONV Risk Score and Plan: 3 and Ondansetron and Dexamethasone  Airway Management Planned: Oral ETT and Video Laryngoscope Planned  Additional Equipment:   Intra-op Plan:   Post-operative Plan: Extubation in OR  Informed Consent: I have reviewed the patients History and Physical, chart, labs and discussed the procedure including the risks, benefits and alternatives for the proposed anesthesia with the patient or authorized representative who has indicated his/her understanding and acceptance.   Dental advisory given  Plan Discussed with: CRNA  Anesthesia Plan Comments: (c collar)       Anesthesia Quick Evaluation
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
## 2017-08-12 ENCOUNTER — Encounter (HOSPITAL_COMMUNITY): Payer: Self-pay | Admitting: Orthopaedic Surgery

## 2017-08-12 ENCOUNTER — Inpatient Hospital Stay (HOSPITAL_COMMUNITY): Payer: Medicare Other

## 2017-08-12 LAB — PREPARE FRESH FROZEN PLASMA

## 2017-08-12 LAB — CBC
HCT: 29.4 % — ABNORMAL LOW (ref 39.0–52.0)
HCT: 29.9 % — ABNORMAL LOW (ref 39.0–52.0)
Hemoglobin: 9.8 g/dL — ABNORMAL LOW (ref 13.0–17.0)
Hemoglobin: 9.9 g/dL — ABNORMAL LOW (ref 13.0–17.0)
MCH: 30.2 pg (ref 26.0–34.0)
MCH: 30.4 pg (ref 26.0–34.0)
MCHC: 33.3 g/dL (ref 30.0–36.0)
MCHC: 33.1 g/dL (ref 30.0–36.0)
MCV: 90.5 fL (ref 78.0–100.0)
MCV: 91.7 fL (ref 78.0–100.0)
PLATELETS: 81 10*3/uL — AB (ref 150–400)
Platelets: 81 K/uL — ABNORMAL LOW (ref 150–400)
RBC: 3.25 MIL/uL — ABNORMAL LOW (ref 4.22–5.81)
RBC: 3.26 MIL/uL — ABNORMAL LOW (ref 4.22–5.81)
RDW: 14.5 % (ref 11.5–15.5)
RDW: 14.9 % (ref 11.5–15.5)
WBC: 8.5 K/uL (ref 4.0–10.5)
WBC: 8.2 10*3/uL (ref 4.0–10.5)

## 2017-08-12 LAB — TROPONIN I: TROPONIN I: 0.25 ng/mL — AB (ref <0.03)

## 2017-08-12 LAB — TYPE AND SCREEN
ABO/RH(D): O POS
Antibody Screen: NEGATIVE
UNIT DIVISION: 0
UNIT DIVISION: 0

## 2017-08-12 LAB — BASIC METABOLIC PANEL WITH GFR
Anion gap: 7 (ref 5–15)
BUN: 18 mg/dL (ref 6–20)
CO2: 25 mmol/L (ref 22–32)
Calcium: 7.6 mg/dL — ABNORMAL LOW (ref 8.9–10.3)
Chloride: 108 mmol/L (ref 101–111)
Creatinine, Ser: 0.96 mg/dL (ref 0.61–1.24)
GFR calc Af Amer: >60 mL/min
GFR calc non Af Amer: >60 mL/min
Glucose, Bld: 184 mg/dL — ABNORMAL HIGH (ref 65–99)
Potassium: 3.7 mmol/L (ref 3.5–5.1)
Sodium: 140 mmol/L (ref 135–145)

## 2017-08-12 LAB — BPAM RBC
BLOOD PRODUCT EXPIRATION DATE: 201907062359
Blood Product Expiration Date: 201907062359
ISSUE DATE / TIME: 201906080156
ISSUE DATE / TIME: 201906080622
UNIT TYPE AND RH: 5100
Unit Type and Rh: 5100

## 2017-08-12 LAB — BPAM FFP
BLOOD PRODUCT EXPIRATION DATE: 201906102359
Blood Product Expiration Date: 201906102359
ISSUE DATE / TIME: 201906080102
ISSUE DATE / TIME: 201906080102
UNIT TYPE AND RH: 600
Unit Type and Rh: 6200

## 2017-08-12 LAB — PROTIME-INR
INR: 1.15
INR: 1.15
PROTHROMBIN TIME: 14.6 s (ref 11.4–15.2)
Prothrombin Time: 14.7 s (ref 11.4–15.2)

## 2017-08-12 LAB — MAGNESIUM: Magnesium: 1.7 mg/dL (ref 1.7–2.4)

## 2017-08-12 MED ORDER — CEFAZOLIN SODIUM-DEXTROSE 2-4 GM/100ML-% IV SOLN
2.0000 g | Freq: Once | INTRAVENOUS | Status: AC
  Administered 2017-08-13: 2 g via INTRAVENOUS
  Filled 2017-08-12 (×2): qty 100

## 2017-08-12 MED ORDER — ORAL CARE MOUTH RINSE
15.0000 mL | Freq: Two times a day (BID) | OROMUCOSAL | Status: DC
  Administered 2017-08-12 – 2017-08-14 (×6): 15 mL via OROMUCOSAL

## 2017-08-12 MED ORDER — CHLORHEXIDINE GLUCONATE 0.12 % MT SOLN
15.0000 mL | Freq: Two times a day (BID) | OROMUCOSAL | Status: DC
  Administered 2017-08-12 – 2017-08-14 (×6): 15 mL via OROMUCOSAL
  Filled 2017-08-12 (×3): qty 15
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
## 2017-08-13 ENCOUNTER — Inpatient Hospital Stay (HOSPITAL_COMMUNITY): Payer: Medicare Other

## 2017-08-13 ENCOUNTER — Encounter (HOSPITAL_COMMUNITY): Payer: Self-pay

## 2017-08-13 ENCOUNTER — Encounter (HOSPITAL_COMMUNITY): Admission: EM | Disposition: A | Discharge status: Skilled Nursing Facility | Payer: Self-pay | Source: Home / Self Care

## 2017-08-13 ENCOUNTER — Inpatient Hospital Stay (HOSPITAL_COMMUNITY): Payer: Medicare Other | Admitting: Certified Registered Nurse Anesthetist

## 2017-08-13 DIAGNOSIS — S82132A Displaced fracture of medial condyle of left tibia, initial encounter for closed fracture: Secondary | ICD-10-CM

## 2017-08-13 DIAGNOSIS — Z01810 Encounter for preprocedural cardiovascular examination: Secondary | ICD-10-CM

## 2017-08-13 DIAGNOSIS — S82142A Displaced bicondylar fracture of left tibia, initial encounter for closed fracture: Secondary | ICD-10-CM

## 2017-08-13 DIAGNOSIS — S82101A Unspecified fracture of upper end of right tibia, initial encounter for closed fracture: Secondary | ICD-10-CM

## 2017-08-13 DIAGNOSIS — Z952 Presence of prosthetic heart valve: Secondary | ICD-10-CM

## 2017-08-13 DIAGNOSIS — J942 Hemothorax: Secondary | ICD-10-CM

## 2017-08-13 HISTORY — PX: ORIF TIBIA PLATEAU: SHX2132

## 2017-08-13 LAB — CBC
HEMATOCRIT: 26.2 % — AB (ref 39.0–52.0)
HEMOGLOBIN: 8.6 g/dL — AB (ref 13.0–17.0)
MCH: 29.8 pg (ref 26.0–34.0)
MCHC: 32.8 g/dL (ref 30.0–36.0)
MCV: 90.7 fL (ref 78.0–100.0)
Platelets: 77 10*3/uL — ABNORMAL LOW (ref 150–400)
RBC: 2.89 MIL/uL — ABNORMAL LOW (ref 4.22–5.81)
RDW: 14.8 % (ref 11.5–15.5)
WBC: 7 10*3/uL (ref 4.0–10.5)

## 2017-08-13 LAB — BASIC METABOLIC PANEL
Anion gap: 8 (ref 5–15)
BUN: 15 mg/dL (ref 6–20)
CHLORIDE: 108 mmol/L (ref 101–111)
CO2: 27 mmol/L (ref 22–32)
CREATININE: 0.88 mg/dL (ref 0.61–1.24)
Calcium: 7.9 mg/dL — ABNORMAL LOW (ref 8.9–10.3)
GFR calc non Af Amer: >60 mL/min (ref >60)
GLUCOSE: 119 mg/dL — AB (ref 65–99)
Potassium: 3.8 mmol/L (ref 3.5–5.1)
Sodium: 143 mmol/L (ref 135–145)

## 2017-08-13 LAB — PROTIME-INR
INR: 1.04
Prothrombin Time: 13.5 seconds (ref 11.4–15.2)

## 2017-08-13 SURGERY — OPEN REDUCTION INTERNAL FIXATION (ORIF) TIBIAL PLATEAU
Anesthesia: General | Laterality: Left

## 2017-08-13 MED ORDER — FENTANYL CITRATE (PF) 250 MCG/5ML IJ SOLN
INTRAMUSCULAR | Status: AC
  Filled 2017-08-13: qty 5

## 2017-08-13 MED ORDER — VANCOMYCIN HCL IN DEXTROSE 1-5 GM/200ML-% IV SOLN
1000.0000 mg | INTRAVENOUS | Status: AC
  Administered 2017-08-13: 1000 mg via INTRAVENOUS
  Filled 2017-08-13 (×2): qty 200

## 2017-08-13 MED ORDER — SUGAMMADEX SODIUM 200 MG/2ML IV SOLN
INTRAVENOUS | Status: AC
  Filled 2017-08-13: qty 2

## 2017-08-13 MED ORDER — 0.9 % SODIUM CHLORIDE (POUR BTL) OPTIME
TOPICAL | Status: DC | PRN
  Administered 2017-08-13: 1000 mL

## 2017-08-13 MED ORDER — DEXAMETHASONE SODIUM PHOSPHATE 10 MG/ML IJ SOLN
INTRAMUSCULAR | Status: AC
  Filled 2017-08-13: qty 1

## 2017-08-13 MED ORDER — ALBUMIN HUMAN 5 % IV SOLN
INTRAVENOUS | Status: DC | PRN
  Administered 2017-08-13: 14:00:00 via INTRAVENOUS

## 2017-08-13 MED ORDER — PROMETHAZINE HCL 25 MG/ML IJ SOLN: 6.2500 mg | INTRAMUSCULAR | Status: DC | PRN

## 2017-08-13 MED ORDER — HYDROMORPHONE HCL 2 MG/ML IJ SOLN
0.3000 mg | INTRAMUSCULAR | Status: DC | PRN
  Administered 2017-08-13: 0.5 mg via INTRAVENOUS

## 2017-08-13 MED ORDER — ROCURONIUM BROMIDE 10 MG/ML (PF) SYRINGE
PREFILLED_SYRINGE | INTRAVENOUS | Status: DC | PRN
  Administered 2017-08-13: 50 mg via INTRAVENOUS

## 2017-08-13 MED ORDER — PROPOFOL 10 MG/ML IV BOLUS
INTRAVENOUS | Status: AC
  Filled 2017-08-13: qty 20

## 2017-08-13 MED ORDER — FENTANYL CITRATE (PF) 100 MCG/2ML IJ SOLN
INTRAMUSCULAR | Status: DC | PRN
  Administered 2017-08-13: 100 ug via INTRAVENOUS
  Administered 2017-08-13 (×2): 50 ug via INTRAVENOUS

## 2017-08-13 MED ORDER — LACTATED RINGERS IV SOLN
INTRAVENOUS | Status: DC
  Administered 2017-08-13: 12:00:00 via INTRAVENOUS

## 2017-08-13 MED ORDER — SUCCINYLCHOLINE CHLORIDE 200 MG/10ML IV SOSY
PREFILLED_SYRINGE | INTRAVENOUS | Status: DC | PRN
  Administered 2017-08-13: 120 mg via INTRAVENOUS

## 2017-08-13 MED ORDER — DEXAMETHASONE SODIUM PHOSPHATE 10 MG/ML IJ SOLN
INTRAMUSCULAR | Status: DC | PRN
  Administered 2017-08-13: 5 mg via INTRAVENOUS

## 2017-08-13 MED ORDER — METOPROLOL TARTRATE 5 MG/5ML IV SOLN
INTRAVENOUS | Status: AC
  Filled 2017-08-13: qty 5

## 2017-08-13 MED ORDER — PROPOFOL 10 MG/ML IV BOLUS
INTRAVENOUS | Status: DC | PRN
  Administered 2017-08-13: 150 mg via INTRAVENOUS

## 2017-08-13 MED ORDER — MIDAZOLAM HCL 2 MG/2ML IJ SOLN
INTRAMUSCULAR | Status: AC
  Filled 2017-08-13: qty 2

## 2017-08-13 MED ORDER — ONDANSETRON HCL 4 MG/2ML IJ SOLN
INTRAMUSCULAR | Status: AC
  Filled 2017-08-13: qty 2

## 2017-08-13 MED ORDER — HYDROMORPHONE HCL 2 MG/ML IJ SOLN
INTRAMUSCULAR | Status: AC
  Filled 2017-08-13: qty 1

## 2017-08-13 MED ORDER — SUGAMMADEX SODIUM 200 MG/2ML IV SOLN
INTRAVENOUS | Status: DC | PRN
  Administered 2017-08-13: 200 mg via INTRAVENOUS

## 2017-08-13 MED ORDER — METOPROLOL TARTRATE 5 MG/5ML IV SOLN
INTRAVENOUS | Status: DC | PRN
  Administered 2017-08-13: 1 mg via INTRAVENOUS

## 2017-08-13 MED ORDER — LIDOCAINE 2% (20 MG/ML) 5 ML SYRINGE
INTRAMUSCULAR | Status: DC | PRN
  Administered 2017-08-13: 60 mg via INTRAVENOUS

## 2017-08-13 MED ORDER — FENTANYL CITRATE (PF) 100 MCG/2ML IJ SOLN
INTRAMUSCULAR | Status: AC
  Filled 2017-08-13: qty 2

## 2017-08-13 MED ORDER — BUPIVACAINE HCL (PF) 0.25 % IJ SOLN
INTRAMUSCULAR | Status: AC
  Filled 2017-08-13: qty 30

## 2017-08-13 MED ORDER — ONDANSETRON HCL 4 MG/2ML IJ SOLN
INTRAMUSCULAR | Status: DC | PRN
  Administered 2017-08-13: 4 mg via INTRAVENOUS

## 2017-08-13 MED ORDER — IOHEXOL 300 MG/ML  SOLN: 150.0000 mL | Freq: Once | INTRAMUSCULAR | Status: DC | PRN

## 2017-08-13 MED ORDER — BUPIVACAINE HCL 0.25 % IJ SOLN
INTRAMUSCULAR | Status: DC | PRN
  Administered 2017-08-13: 15 mL

## 2017-08-13 SURGICAL SUPPLY — 68 items
"Smith and Nephew 3.5 washer " IMPLANT
BANDAGE ACE 4X5 VEL STRL LF (GAUZE/BANDAGES/DRESSINGS) ×6 IMPLANT
BANDAGE ACE 6X5 VEL STRL LF (GAUZE/BANDAGES/DRESSINGS) ×3 IMPLANT
BANDAGE ESMARK 6X9 LF (GAUZE/BANDAGES/DRESSINGS) ×1 IMPLANT
BIT DRILL OVR 3.5AO QC SHRT SM (DRILL) IMPLANT
BIT DRILL QC 2.5MM SHRT EVO SM (DRILL) IMPLANT
BLADE CLIPPER SURG (BLADE) IMPLANT
BNDG CMPR 9X6 STRL LF SNTH (GAUZE/BANDAGES/DRESSINGS) ×1
BNDG COHESIVE 6X5 TAN STRL LF (GAUZE/BANDAGES/DRESSINGS) ×3 IMPLANT
BNDG ESMARK 6X9 LF (GAUZE/BANDAGES/DRESSINGS) ×3
COVER SURGICAL LIGHT HANDLE (MISCELLANEOUS) ×3 IMPLANT
DRAPE C-ARM 42X72 X-RAY (DRAPES) ×3 IMPLANT
DRAPE C-ARMOR (DRAPES) ×3 IMPLANT
DRAPE HALF SHEET 40X57 (DRAPES) ×3 IMPLANT
DRAPE IMP U-DRAPE 54X76 (DRAPES) ×6 IMPLANT
DRAPE INCISE IOBAN 66X45 STRL (DRAPES) ×5 IMPLANT
DRAPE ORTHO SPLIT 77X108 STRL (DRAPES) ×9
DRAPE SURG ORHT 6 SPLT 77X108 (DRAPES) ×2 IMPLANT
DRAPE U-SHAPE 47X51 STRL (DRAPES) ×3 IMPLANT
DRILL OVER 3.5 AO QC SHORT SM (DRILL) ×3
DRILL QC 2.5MM SHORT EVOS SM (DRILL) ×3
DRSG MEPILEX BORDER 4X4 (GAUZE/BANDAGES/DRESSINGS) ×2 IMPLANT
DURAPREP 26ML APPLICATOR (WOUND CARE) ×5 IMPLANT
ELECT REM PT RETURN 9FT ADLT (ELECTROSURGICAL) ×3
ELECTRODE REM PT RTRN 9FT ADLT (ELECTROSURGICAL) ×1 IMPLANT
FACESHIELD STD STERILE (MASK) ×3 IMPLANT
GAUZE SPONGE 4X4 12PLY STRL (GAUZE/BANDAGES/DRESSINGS) ×2 IMPLANT
GAUZE SPONGE 4X4 12PLY STRL LF (GAUZE/BANDAGES/DRESSINGS) ×3 IMPLANT
GAUZE XEROFORM 1X8 LF (GAUZE/BANDAGES/DRESSINGS) ×3 IMPLANT
GAUZE XEROFORM 5X9 LF (GAUZE/BANDAGES/DRESSINGS) ×6 IMPLANT
GLOVE BIOGEL PI IND STRL 7.0 (GLOVE) ×1 IMPLANT
GLOVE BIOGEL PI INDICATOR 7.0 (GLOVE) ×2
GLOVE ECLIPSE 7.0 STRL STRAW (GLOVE) ×3 IMPLANT
GLOVE SKINSENSE NS SZ7.5 (GLOVE) ×2
GLOVE SKINSENSE STRL SZ7.5 (GLOVE) ×1 IMPLANT
GLOVE SURG SYN 7.5  E (GLOVE) ×2
GLOVE SURG SYN 7.5 E (GLOVE) ×1 IMPLANT
GLOVE SURG SYN 7.5 PF PI (GLOVE) ×1 IMPLANT
GOWN STRL REIN XL XLG (GOWN DISPOSABLE) ×5 IMPLANT
K-WIRE 1.6 (WIRE) ×6
K-WIRE FX150X1.6XTROC PNT (WIRE) ×2
KIT BASIN OR (CUSTOM PROCEDURE TRAY) ×3 IMPLANT
KIT TURNOVER KIT B (KITS) ×3 IMPLANT
KWIRE FX150X1.6XTROC PNT (WIRE) IMPLANT
MANIFOLD NEPTUNE II (INSTRUMENTS) ×3 IMPLANT
NDL SUT 6 .5 CRC .975X.05 MAYO (NEEDLE) IMPLANT
NEEDLE MAYO TAPER (NEEDLE)
NS IRRIG 1000ML POUR BTL (IV SOLUTION) ×3 IMPLANT
PACK ORTHO EXTREMITY (CUSTOM PROCEDURE TRAY) ×2 IMPLANT
PAD ABD 8X10 STRL (GAUZE/BANDAGES/DRESSINGS) ×9 IMPLANT
PAD ARMBOARD 7.5X6 YLW CONV (MISCELLANEOUS) ×6 IMPLANT
SCREW OST EVOS FT 4.7X75 (Screw) ×3 IMPLANT
SCREW OST EVOS FT 4.7X80 (Screw) ×2 IMPLANT
SCREW OST EVOS FT CORT 4.7X75 (Screw) IMPLANT
SCREW OST EVOS PT 4.7X85 (Screw) ×2 IMPLANT
STAPLER VISISTAT 35W (STAPLE) IMPLANT
SUT ETHILON 2 0 FS 18 (SUTURE) IMPLANT
SUT ETHILON 3 0 PS 1 (SUTURE) ×2 IMPLANT
SUT PDS AB 0 CT 36 (SUTURE) IMPLANT
SUT VIC AB 0 CT1 27 (SUTURE)
SUT VIC AB 0 CT1 27XBRD ANBCTR (SUTURE) IMPLANT
SUT VIC AB 2-0 CT1 27 (SUTURE) ×6
SUT VIC AB 2-0 CT1 TAPERPNT 27 (SUTURE) ×1 IMPLANT
SYR CONTROL 10ML LL (SYRINGE) ×2 IMPLANT
Smith and Nephew 3.5 washer ×2 IMPLANT
TOWEL OR 17X24 6PK STRL BLUE (TOWEL DISPOSABLE) ×3 IMPLANT
TOWEL OR 17X26 10 PK STRL BLUE (TOWEL DISPOSABLE) ×6 IMPLANT
WASHER SCREW EVOS 3.5 (MISCELLANEOUS) ×1 IMPLANT

## 2017-08-13 NOTE — Transfer of Care (Signed)
Immediate Anesthesia Transfer of Care Note  Patient: Corey Mathis  Procedure(s) Performed: OPEN REDUCTION INTERNAL FIXATION (ORIF) TIBIAL PLATEAU (Left )  Patient Location: PACU  Anesthesia Type:General  Level of Consciousness: awake, alert , oriented and patient cooperative  Airway & Oxygen Therapy: Patient Spontanous Breathing and Patient connected to nasal cannula oxygen  Post-op Assessment: Report given to RN and Post -op Vital signs reviewed and stable  Post vital signs: Reviewed and stable  Last Vitals:  Vitals Value Taken Time  BP 162/81 08/13/2017  2:48 PM  Temp    Pulse 98 08/13/2017  2:54 PM  Resp 19 08/13/2017  2:54 PM  SpO2 91 % 08/13/2017  2:54 PM  Vitals shown include unvalidated device data.  Last Pain:  Vitals:   08/13/17 0911  TempSrc:   PainSc: Asleep      Patients Stated Pain Goal: 3 (00/17/49 4496)  Complications: No apparent anesthesia complications
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
## 2017-08-13 NOTE — Anesthesia Preprocedure Evaluation (Addendum)
Anesthesia Evaluation  Patient identified by MRN, date of birth, ID band Patient awake    Reviewed: Allergy & Precautions, NPO status , Patient's Chart, lab work & pertinent test results  Airway Mallampati: III  TM Distance: <3 FB Neck ROM: Full    Dental no notable dental hx.    Pulmonary asthma ,    Pulmonary exam normal breath sounds clear to auscultation       Cardiovascular hypertension, Pt. on medications and Pt. on home beta blockers  Rhythm:Regular Rate:Tachycardia  H/O MV replacement   Neuro/Psych negative neurological ROS  negative psych ROS   GI/Hepatic negative GI ROS, Neg liver ROS,   Endo/Other  negative endocrine ROS  Renal/GU negative Renal ROS  negative genitourinary   Musculoskeletal negative musculoskeletal ROS (+)   Abdominal   Peds negative pediatric ROS (+)  Hematology  (+) anemia ,   Anesthesia Other Findings   Reproductive/Obstetrics negative OB ROS                            Anesthesia Physical Anesthesia Plan  ASA: III  Anesthesia Plan: General   Post-op Pain Management:    Induction: Intravenous  PONV Risk Score and Plan: 2 and Ondansetron, Dexamethasone and Treatment may vary due to age or medical condition  Airway Management Planned: Oral ETT and Video Laryngoscope Planned  Additional Equipment:   Intra-op Plan:   Post-operative Plan: Extubation in OR  Informed Consent: I have reviewed the patients History and Physical, chart, labs and discussed the procedure including the risks, benefits and alternatives for the proposed anesthesia with the patient or authorized representative who has indicated his/her understanding and acceptance.   Dental advisory given  Plan Discussed with: CRNA and Surgeon  Anesthesia Plan Comments:        Anesthesia Quick Evaluation
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
## 2017-08-13 NOTE — Anesthesia Postprocedure Evaluation (Signed)
Anesthesia Post Note  Patient: Corey Mathis  Procedure(s) Performed: OPEN REDUCTION INTERNAL FIXATION (ORIF) TIBIAL PLATEAU (Left )     Patient location during evaluation: PACU Anesthesia Type: General Level of consciousness: awake and alert Pain management: pain level controlled Vital Signs Assessment: post-procedure vital signs reviewed and stable Respiratory status: spontaneous breathing, nonlabored ventilation, respiratory function stable and patient connected to nasal cannula oxygen Cardiovascular status: blood pressure returned to baseline and stable Postop Assessment: no apparent nausea or vomiting Anesthetic complications: no    Last Vitals:  Vitals:   08/13/17 1515 08/13/17 1530  BP:  (!) 174/91  Pulse: (!) 105   Resp: 17   Temp:  37 C  SpO2: 93% 96%    Last Pain:  Vitals:   08/13/17 1530  TempSrc: Bladder  PainSc:                  Corey Mathis

## 2017-08-13 NOTE — H&P (Signed)

## 2017-08-13 NOTE — Anesthesia Procedure Notes (Signed)
Procedure Name: Intubation Date/Time: 08/13/2017 1:12 PM Performed by: Colin Benton, CRNA Pre-anesthesia Checklist: Patient identified, Emergency Drugs available, Suction available and Patient being monitored Patient Re-evaluated:Patient Re-evaluated prior to induction Oxygen Delivery Method: Circle system utilized Preoxygenation: Pre-oxygenation with 100% oxygen Induction Type: IV induction Ventilation: Mask ventilation without difficulty Laryngoscope Size: Glidescope and 4 Grade View: Grade I Tube type: Oral Tube size: 8.0 mm Number of attempts: 1 Airway Equipment and Method: Rigid stylet and Video-laryngoscopy Placement Confirmation: ETT inserted through vocal cords under direct vision,  positive ETCO2 and breath sounds checked- equal and bilateral Secured at: 24 cm Tube secured with: Tape Dental Injury: Teeth and Oropharynx as per pre-operative assessment  Difficulty Due To: Difficult Airway- due to large tongue and Difficulty was anticipated

## 2017-08-14 LAB — PROTIME-INR
INR: 1.13
PROTHROMBIN TIME: 14.4 s (ref 11.4–15.2)

## 2017-08-14 LAB — CBC WITH DIFFERENTIAL/PLATELET
Abs Immature Granulocytes: 0.1 10*3/uL (ref 0.0–0.1)
BASOS ABS: 0 10*3/uL (ref 0.0–0.1)
BASOS PCT: 0 %
EOS PCT: 0 %
Eosinophils Absolute: 0 10*3/uL (ref 0.0–0.7)
HCT: 22.7 % — ABNORMAL LOW (ref 39.0–52.0)
Hemoglobin: 7.4 g/dL — ABNORMAL LOW (ref 13.0–17.0)
Immature Granulocytes: 1 %
Lymphocytes Relative: 5 %
Lymphs Abs: 0.3 10*3/uL — ABNORMAL LOW (ref 0.7–4.0)
MCH: 30.3 pg (ref 26.0–34.0)
MCHC: 32.6 g/dL (ref 30.0–36.0)
MCV: 93 fL (ref 78.0–100.0)
MONO ABS: 0.6 10*3/uL (ref 0.1–1.0)
Monocytes Relative: 9 %
Neutro Abs: 5.5 10*3/uL (ref 1.7–7.7)
Neutrophils Relative %: 85 %
PLATELETS: 84 10*3/uL — AB (ref 150–400)
RBC: 2.44 MIL/uL — AB (ref 4.22–5.81)
RDW: 14.6 % (ref 11.5–15.5)
WBC: 6.5 10*3/uL (ref 4.0–10.5)

## 2017-08-14 LAB — BASIC METABOLIC PANEL
Anion gap: 5 (ref 5–15)
BUN: 12 mg/dL (ref 6–20)
CALCIUM: 7.7 mg/dL — AB (ref 8.9–10.3)
CO2: 29 mmol/L (ref 22–32)
CREATININE: 0.81 mg/dL (ref 0.61–1.24)
Chloride: 108 mmol/L (ref 101–111)
GFR calc Af Amer: >60 mL/min (ref >60)
Glucose, Bld: 124 mg/dL — ABNORMAL HIGH (ref 65–99)
Potassium: 3.7 mmol/L (ref 3.5–5.1)
Sodium: 142 mmol/L (ref 135–145)

## 2017-08-14 LAB — HEPARIN LEVEL (UNFRACTIONATED): HEPARIN UNFRACTIONATED: 0.17 [IU]/mL — AB (ref 0.30–0.70)

## 2017-08-14 LAB — PREPARE RBC (CROSSMATCH)

## 2017-08-14 MED ORDER — FERROUS GLUCONATE 324 (38 FE) MG PO TABS
324.0000 mg | ORAL_TABLET | Freq: Two times a day (BID) | ORAL | Status: DC
  Administered 2017-08-14 – 2017-08-31 (×33): 324 mg via ORAL
  Filled 2017-08-14 (×36): qty 1

## 2017-08-14 MED ORDER — POLYETHYLENE GLYCOL 3350 17 G PO PACK
17.0000 g | PACK | Freq: Every day | ORAL | Status: DC
  Administered 2017-08-14 – 2017-08-18 (×5): 17 g via ORAL
  Filled 2017-08-14 (×5): qty 1

## 2017-08-14 MED ORDER — HEPARIN (PORCINE) IN NACL 100-0.45 UNIT/ML-% IJ SOLN
1700.0000 [IU]/h | INTRAMUSCULAR | Status: DC
  Administered 2017-08-14: 1100 [IU]/h via INTRAVENOUS
  Administered 2017-08-15: 1300 [IU]/h via INTRAVENOUS
  Administered 2017-08-17 – 2017-08-18 (×2): 1550 [IU]/h via INTRAVENOUS
  Administered 2017-08-19: 1700 [IU]/h via INTRAVENOUS
  Administered 2017-08-20: 1900 [IU]/h via INTRAVENOUS
  Administered 2017-08-21 (×2): 1700 [IU]/h via INTRAVENOUS
  Filled 2017-08-14 (×12): qty 250

## 2017-08-14 MED ORDER — PROSIGHT PO TABS
1.0000 | ORAL_TABLET | Freq: Every day | ORAL | Status: DC
  Administered 2017-08-14 – 2017-08-31 (×17): 1 via ORAL
  Filled 2017-08-14 (×17): qty 1

## 2017-08-14 NOTE — Progress Notes (Signed)
Subjective:  1 Day Post-Op Procedure(s) (LRB): OPEN REDUCTION INTERNAL FIXATION (ORIF) TIBIAL PLATEAU (Left) Patient reports pain as mild.  Patient feeling ok this am.    Objective: Vital signs in last 24 hours: Temp:  [97.3 F (36.3 C)-99.7 F (37.6 C)] 98.2 F (36.8 C) (06/11 0600) Pulse Rate:  [77-108] 88 (06/11 0600) Resp:  [11-35] 17 (06/11 0600) BP: (87-174)/(55-91) 147/75 (06/11 0600) SpO2:  [93 %-100 %] 100 % (06/11 0600)  Intake/Output from previous day: 06/10 0701 - 06/11 0700 In: 3211 [P.O.:480; I.V.:2381; IV Piggyback:350] Out: 1385 [Urine:1360; Blood:25] Intake/Output this shift: No intake/output data recorded.  Recent Labs    08/11/17 2324 08/12/17 0252 08/12/17 0834 08/13/17 0243 08/14/17 0327  HGB 10.3* 9.8* 9.9* 8.6* 7.4*   Recent Labs    08/13/17 0243 08/14/17 0327  WBC 7.0 6.5  RBC 2.89* 2.44*  HCT 26.2* 22.7*  PLT 77* 84*   Recent Labs    08/13/17 0243 08/14/17 0327  NA 143 142  K 3.8 3.7  CL 108 108  CO2 27 29  BUN 15 12  CREATININE 0.88 0.81  GLUCOSE 119* 124*  CALCIUM 7.9* 7.7*   Recent Labs    08/13/17 0243 08/14/17 0327  INR 1.04 1.13    Neurologically intact Neurovascular intact Sensation intact distally Intact pulses distally Dorsiflexion/Plantar flexion intact Incision: scant drainage No cellulitis present Compartment soft  LLE-no drainage noted through ace bandage.  Calf soft and NT.  Compartments soft.  EHL/FHL intact RLE-mild bloody drainage to pin sites.  Calf is soft and NT.  Compartments are still soft.  EHL/FHL intact    Assessment/Plan: 1 Day Post-Op Procedure(s) (LRB): OPEN REDUCTION INTERNAL FIXATION (ORIF) TIBIAL PLATEAU (Left)  NWB BLE Continue pin site care to RLE RLE-will take back to OR end of this week or early next week for definitive treatment.  Timing will be based upon swelling so we will continue to monitor this over the course of the week     Aundra Dubin 08/14/2017, 7:53 AM
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
## 2017-08-14 NOTE — Progress Notes (Signed)
Trauma Service Note  Subjective: Patient is awake and alert.  Feeling much better today.  No abdominal or chest pain  Objective: Vital signs in last 24 hours: Temp:  [97.3 F (36.3 C)-99.7 F (37.6 C)] 98.2 F (36.8 C) (06/11 0800) Pulse Rate:  [77-108] 90 (06/11 0800) Resp:  [11-35] 23 (06/11 0800) BP: (87-174)/(55-91) 134/70 (06/11 0800) SpO2:  [93 %-100 %] 100 % (06/11 0800) Last BM Date: (pta)  Intake/Output from previous day: 06/10 0701 - 06/11 0700 In: 3211 [P.O.:480; I.V.:2381; IV Piggyback:350] Out: 1385 [Urine:1360; Blood:25] Intake/Output this shift: Total I/O In: 150 [I.V.:150] Out: 30 [Urine:30]  General: No acute distress.  He says that he feels tired.  Lungs: Clear to auscultation.  Abd: Mildly distended, not tender, good bowel sounds.  Tolerating clear liquids well  Extremities: Still has external fixator in place on right leg with good pulses bilaterally and normal sensation.   Neuro: Intact  Lab Results: CBC  Recent Labs    08/13/17 0243 08/14/17 0327  WBC 7.0 6.5  HGB 8.6* 7.4*  HCT 26.2* 22.7*  PLT 77* 84*   BMET Recent Labs    08/13/17 0243 08/14/17 0327  NA 143 142  K 3.8 3.7  CL 108 108  CO2 27 29  GLUCOSE 119* 124*  BUN 15 12  CREATININE 0.88 0.81  CALCIUM 7.9* 7.7*   PT/INR Recent Labs    08/13/17 0243 08/14/17 0327  LABPROT 13.5 14.4  INR 1.04 1.13   ABG No results for input(s): PHART, HCO3 in the last 72 hours.  Invalid input(s): PCO2, PO2  Studies/Results: Dg C-arm 1-60 Min  Result Date: 08/13/2017 CLINICAL DATA:  ORIF left proximal tibia. EXAM: DG C-ARM 61-120 MIN; LEFT KNEE - 3 VIEW COMPARISON:  August 10, 2017 FINDINGS: Two screws have been placed across the tibial plateau fracture. No other interval changes. IMPRESSION: ORIF of tibial plateau fracture as above. Electronically Signed   By: Dorise Bullion III M.D   On: 08/13/2017 14:39   Dg Knee 2 Views Left  Result Date: 08/13/2017 CLINICAL DATA:  ORIF left  proximal tibia. EXAM: DG C-ARM 61-120 MIN; LEFT KNEE - 3 VIEW COMPARISON:  August 10, 2017 FINDINGS: Two screws have been placed across the tibial plateau fracture. No other interval changes. IMPRESSION: ORIF of tibial plateau fracture as above. Electronically Signed   By: Dorise Bullion III M.D   On: 08/13/2017 14:39    Anti-infectives: Anti-infectives (From admission, onward)   Start     Dose/Rate Route Frequency Ordered Stop   08/13/17 0900  vancomycin (VANCOCIN) IVPB 1000 mg/200 mL premix     1,000 mg 200 mL/hr over 60 Minutes Intravenous To ShortStay Surgical 08/13/17 0855 08/13/17 1323   08/12/17 1400  ceFAZolin (ANCEF) IVPB 2g/100 mL premix    Note to Pharmacy:  Anesthesia to give preop   2 g 200 mL/hr over 30 Minutes Intravenous  Once 08/12/17 1350 08/13/17 1326   08/11/17 0900  vancomycin (VANCOCIN) IVPB 1000 mg/200 mL premix     1,000 mg 200 mL/hr over 60 Minutes Intravenous  Once 08/11/17 0849 08/11/17 1021   08/11/17 0852  vancomycin (VANCOCIN) 1-5 GM/200ML-% IVPB    Note to Pharmacy:  Henrine Screws   : cabinet override      08/11/17 0852 08/11/17 0921   08/11/17 0830  ceFAZolin (ANCEF) IVPB 2g/100 mL premix    Note to Pharmacy:  Anesthesia to give preop   2 g 200 mL/hr over 30 Minutes Intravenous  Once  08/11/17 0828 08/11/17 0916      Assessment/Plan: s/p Procedure(s): OPEN REDUCTION INTERNAL FIXATION (ORIF) TIBIAL PLATEAU Advance diet Restart heparin without a bolus  Iron and vitamins. Keep in ICU with the start of the heparin.  LOS: 3 days   Kathryne Eriksson. Dahlia Bailiff, MD, FACS [phone] Trauma Surgeon 08/14/2017
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
## 2017-08-14 NOTE — Progress Notes (Signed)
Stable in the ICU.  Pain is controlled. Left leg dressings are c/d/i.  May perform ROM as tolerated with PT.  NWB LLE. Right leg continues to be markedly swollen with multiple fracture blisters.  Compartments remain soft.  Based on findings yesterday in the OR, the soft tissues will unlikely be ready by end of this week for ORIF.  We will evaluate soft tissues again tomorrow to confirm.  Since patient is unlikely going to have surgery for the RLE this week, he could be discharged to SNF while the swelling improves if this is appropriate from a medical and trauma standpoint.

## 2017-08-15 DIAGNOSIS — S82142S Displaced bicondylar fracture of left tibia, sequela: Secondary | ICD-10-CM

## 2017-08-15 LAB — TYPE AND SCREEN
ABO/RH(D): O POS
Antibody Screen: NEGATIVE
Unit division: 0

## 2017-08-15 LAB — BPAM RBC
BLOOD PRODUCT EXPIRATION DATE: 201907092359
ISSUE DATE / TIME: 201906111249
UNIT TYPE AND RH: 5100

## 2017-08-15 LAB — HEPARIN LEVEL (UNFRACTIONATED)
HEPARIN UNFRACTIONATED: 0.3 [IU]/mL (ref 0.30–0.70)
HEPARIN UNFRACTIONATED: 0.32 [IU]/mL (ref 0.30–0.70)

## 2017-08-15 LAB — CBC WITH DIFFERENTIAL/PLATELET
ABS IMMATURE GRANULOCYTES: 0.1 10*3/uL (ref 0.0–0.1)
BASOS ABS: 0 10*3/uL (ref 0.0–0.1)
Basophils Relative: 1 %
Eosinophils Absolute: 0.3 10*3/uL (ref 0.0–0.7)
Eosinophils Relative: 4 %
HCT: 25.4 % — ABNORMAL LOW (ref 39.0–52.0)
HEMOGLOBIN: 8.3 g/dL — AB (ref 13.0–17.0)
Immature Granulocytes: 1 %
LYMPHS PCT: 8 %
Lymphs Abs: 0.6 10*3/uL — ABNORMAL LOW (ref 0.7–4.0)
MCH: 29.5 pg (ref 26.0–34.0)
MCHC: 32.7 g/dL (ref 30.0–36.0)
MCV: 90.4 fL (ref 78.0–100.0)
Monocytes Absolute: 0.7 10*3/uL (ref 0.1–1.0)
Monocytes Relative: 10 %
NEUTROS ABS: 5.7 10*3/uL (ref 1.7–7.7)
Neutrophils Relative %: 76 %
PLATELETS: 104 10*3/uL — AB (ref 150–400)
RBC: 2.81 MIL/uL — AB (ref 4.22–5.81)
RDW: 15.9 % — ABNORMAL HIGH (ref 11.5–15.5)
WBC: 7.5 10*3/uL (ref 4.0–10.5)

## 2017-08-15 LAB — CBC
HEMATOCRIT: 26.3 % — AB (ref 39.0–52.0)
HEMOGLOBIN: 8.7 g/dL — AB (ref 13.0–17.0)
MCH: 30 pg (ref 26.0–34.0)
MCHC: 33.1 g/dL (ref 30.0–36.0)
MCV: 90.7 fL (ref 78.0–100.0)
Platelets: 111 10*3/uL — ABNORMAL LOW (ref 150–400)
RBC: 2.9 MIL/uL — ABNORMAL LOW (ref 4.22–5.81)
RDW: 16.3 % — AB (ref 11.5–15.5)
WBC: 7.6 10*3/uL (ref 4.0–10.5)

## 2017-08-15 MED ORDER — FLEET ENEMA 7-19 GM/118ML RE ENEM
1.0000 | ENEMA | Freq: Every day | RECTAL | Status: DC | PRN
  Administered 2017-08-19 – 2017-08-21 (×2): 1 via RECTAL
  Filled 2017-08-15 (×3): qty 1

## 2017-08-15 MED ORDER — DOCUSATE SODIUM 100 MG PO CAPS
100.0000 mg | ORAL_CAPSULE | Freq: Two times a day (BID) | ORAL | Status: DC
  Administered 2017-08-15 – 2017-08-31 (×31): 100 mg via ORAL
  Filled 2017-08-15 (×32): qty 1

## 2017-08-15 MED ORDER — ALPRAZOLAM 0.5 MG PO TABS
0.5000 mg | ORAL_TABLET | Freq: Two times a day (BID) | ORAL | Status: DC | PRN
  Administered 2017-08-15 – 2017-08-27 (×20): 0.5 mg via ORAL
  Filled 2017-08-15 (×23): qty 1

## 2017-08-15 MED ORDER — SILVER SULFADIAZINE 1 % EX CREA
TOPICAL_CREAM | Freq: Two times a day (BID) | CUTANEOUS | Status: DC
  Administered 2017-08-15 – 2017-08-19 (×7): via TOPICAL
  Administered 2017-08-19: 1 via TOPICAL
  Administered 2017-08-19 – 2017-08-20 (×2): via TOPICAL
  Filled 2017-08-15 (×4): qty 85

## 2017-08-15 MED ORDER — BISACODYL 10 MG RE SUPP
10.0000 mg | Freq: Once | RECTAL | Status: AC
  Administered 2017-08-15: 10 mg via RECTAL
  Filled 2017-08-15: qty 1
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
## 2017-08-15 NOTE — Progress Notes (Signed)
Progress noted - additional surgery may be planned. Would be best to remain on heparin until surgery is completed and transition to warfarin at that time. Probably not feasible to do this at a SNF. No FDA indication for lovenox for mechanical MVR - although sometimes it has been used, IV heparin is preferable.  Please call with questions.  Pixie Casino, MD, [facility], [place] [facility] Director of the [facility]  Card[facility] Diplomate of the American Board of Clinical Lipidology Attending Cardiologist  Direct Dial: [phone]  Fax: [phone]  Website:  [email or web address]

## 2017-08-15 NOTE — Progress Notes (Signed)
Subjective:  2 Days Post-Op Procedure(s) (LRB): OPEN REDUCTION INTERNAL FIXATION (ORIF) TIBIAL PLATEAU (Left) Patient reports pain as mild.  No acute events overnight. Restarted heparin yesterday  Objective: Vital signs in last 24 hours: Temp:  [98.4 F (36.9 C)-99.7 F (37.6 C)] 99.5 F (37.5 C) (06/12 0700) Pulse Rate:  [72-97] 72 (06/12 0700) Resp:  [12-26] 12 (06/12 0700) BP: (106-137)/(61-79) 127/70 (06/12 0700) SpO2:  [99 %-100 %] 100 % (06/12 0700)  Intake/Output from previous day: 06/11 0701 - 06/12 0700 In: 2341.4 [I.V.:1998.9; Blood:342.5] Out: 875 [Urine:875] Intake/Output this shift: No intake/output data recorded.  Recent Labs    08/12/17 0834 08/13/17 0243 08/14/17 0327 08/15/17 0045  HGB 9.9* 8.6* 7.4* 8.3*   Recent Labs    08/14/17 0327 08/15/17 0045  WBC 6.5 7.5  RBC 2.44* 2.81*  HCT 22.7* 25.4*  PLT 84* 104*   Recent Labs    08/13/17 0243 08/14/17 0327  NA 143 142  K 3.8 3.7  CL 108 108  CO2 27 29  BUN 15 12  CREATININE 0.88 0.81  GLUCOSE 119* 124*  CALCIUM 7.9* 7.7*   Recent Labs    08/13/17 0243 08/14/17 0327  INR 1.04 1.13    Neurologically intact Neurovascular intact Sensation intact distally Intact pulses distally Dorsiflexion/Plantar flexion intact Incision: moderate drainage No cellulitis present Compartment soft  LLE-ace bandage c/d/i.  Compartments soft.  Calf soft and nt RLE-ex fix in place moderate bloody drainage to distal half of leg. Marked swelling and fracture blisters.  Calf is soft and nt.  Ace bandages changed by me this am   Assessment/Plan: 2 Days Post-Op Procedure(s) (LRB): OPEN REDUCTION INTERNAL FIXATION (ORIF) TIBIAL PLATEAU (Left) Up with therapy  LLE-NWB. please start ROM of the knee.   RLE-NWB.  Ex fix in place.  Continue with pin site care.  Still significant swelling to RLE so would anticipate definitive tx next week, not this week     Corey Mathis 08/15/2017, 8:22 AM
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
## 2017-08-15 NOTE — Progress Notes (Signed)
Trauma Service Note  Subjective: Patient a bit anxious this AM, pressure speech a bit.  Requesting something for anxiety.  Has not had a BM for 5 days.  Objective: Vital signs in last 24 hours: Temp:  [98.4 F (36.9 C)-99.7 F (37.6 C)] 99.1 F (37.3 C) (06/12 0900) Pulse Rate:  [72-97] 80 (06/12 0900) Resp:  [12-26] 21 (06/12 0900) BP: (106-139)/(61-80) 139/75 (06/12 0900) SpO2:  [99 %-100 %] 99 % (06/12 0900) Last BM Date: 08/10/17(PTA)  Intake/Output from previous day: 06/11 0701 - 06/12 0700 In: 2517.4 [I.V.:2174.9; Blood:342.5] Out: 528 [Urine:875] Intake/Output this shift: Total I/O In: 176 [I.V.:176] Out: 150 [Urine:150]  General: Uncomfortable because of no BM  Lungs: Clear  Abd: Soft, distended, not tense.  Not tender  Extremities: No changes, external fixato on the right.  Not to have surgery until next week.    Neuro: Intact  Lab Results: CBC  Recent Labs    08/14/17 0327 08/15/17 0045  WBC 6.5 7.5  HGB 7.4* 8.3*  HCT 22.7* 25.4*  PLT 84* 104*   BMET Recent Labs    08/13/17 0243 08/14/17 0327  NA 143 142  K 3.8 3.7  CL 108 108  CO2 27 29  GLUCOSE 119* 124*  BUN 15 12  CREATININE 0.88 0.81  CALCIUM 7.9* 7.7*   PT/INR Recent Labs    08/13/17 0243 08/14/17 0327  LABPROT 13.5 14.4  INR 1.04 1.13   ABG No results for input(s): PHART, HCO3 in the last 72 hours.  Invalid input(s): PCO2, PO2  Studies/Results: Dg C-arm 1-60 Min  Result Date: 08/13/2017 CLINICAL DATA:  ORIF left proximal tibia. EXAM: DG C-ARM 61-120 MIN; LEFT KNEE - 3 VIEW COMPARISON:  August 10, 2017 FINDINGS: Two screws have been placed across the tibial plateau fracture. No other interval changes. IMPRESSION: ORIF of tibial plateau fracture as above. Electronically Signed   By: Dorise Bullion III M.D   On: 08/13/2017 14:39   Dg Knee 2 Views Left  Result Date: 08/13/2017 CLINICAL DATA:  ORIF left proximal tibia. EXAM: DG C-ARM 61-120 MIN; LEFT KNEE - 3 VIEW COMPARISON:   August 10, 2017 FINDINGS: Two screws have been placed across the tibial plateau fracture. No other interval changes. IMPRESSION: ORIF of tibial plateau fracture as above. Electronically Signed   By: Dorise Bullion III M.D   On: 08/13/2017 14:39    Anti-infectives: Anti-infectives (From admission, onward)   Start     Dose/Rate Route Frequency Ordered Stop   08/13/17 0900  vancomycin (VANCOCIN) IVPB 1000 mg/200 mL premix     1,000 mg 200 mL/hr over 60 Minutes Intravenous To ShortStay Surgical 08/13/17 0855 08/13/17 1323   08/12/17 1400  ceFAZolin (ANCEF) IVPB 2g/100 mL premix    Note to Pharmacy:  Anesthesia to give preop   2 g 200 mL/hr over 30 Minutes Intravenous  Once 08/12/17 1350 08/13/17 1326   08/11/17 0900  vancomycin (VANCOCIN) IVPB 1000 mg/200 mL premix     1,000 mg 200 mL/hr over 60 Minutes Intravenous  Once 08/11/17 0849 08/11/17 1021   08/11/17 0852  vancomycin (VANCOCIN) 1-5 GM/200ML-% IVPB    Note to Pharmacy:  Henrine Screws   : cabinet override      08/11/17 0852 08/11/17 0921   08/11/17 0830  ceFAZolin (ANCEF) IVPB 2g/100 mL premix    Note to Pharmacy:  Anesthesia to give preop   2 g 200 mL/hr over 30 Minutes Intravenous  Once 08/11/17 0828 08/11/17 4132  Assessment/Plan: s/p Procedure(s): OPEN REDUCTION INTERNAL FIXATION (ORIF) TIBIAL PLATEAU Advance diet Transfer from the ICU to 4NP  Will keep on Heparin as opposed to Lovenox which can be more easily controlle din the perioperative period.  LOS: 4 days   Kathryne Eriksson. Dahlia Bailiff, MD, FACS [phone] Trauma Surgeon 08/15/2017
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
## 2017-08-16 ENCOUNTER — Encounter (HOSPITAL_COMMUNITY): Payer: Self-pay | Admitting: Orthopaedic Surgery

## 2017-08-16 ENCOUNTER — Inpatient Hospital Stay (HOSPITAL_COMMUNITY): Payer: Medicare Other

## 2017-08-16 LAB — HEPARIN LEVEL (UNFRACTIONATED): Heparin Unfractionated: 0.33 IU/mL (ref 0.30–0.70)

## 2017-08-16 LAB — CBC
HCT: 27.9 % — ABNORMAL LOW (ref 39.0–52.0)
Hemoglobin: 9.3 g/dL — ABNORMAL LOW (ref 13.0–17.0)
MCH: 30.4 pg (ref 26.0–34.0)
MCHC: 33.3 g/dL (ref 30.0–36.0)
MCV: 91.2 fL (ref 78.0–100.0)
PLATELETS: 137 10*3/uL — AB (ref 150–400)
RBC: 3.06 MIL/uL — ABNORMAL LOW (ref 4.22–5.81)
RDW: 16 % — AB (ref 11.5–15.5)
WBC: 7.6 10*3/uL (ref 4.0–10.5)

## 2017-08-16 MED ORDER — MOMETASONE FURO-FORMOTEROL FUM 200-5 MCG/ACT IN AERO
2.0000 | INHALATION_SPRAY | Freq: Two times a day (BID) | RESPIRATORY_TRACT | Status: DC
  Filled 2017-08-16: qty 8.8

## 2017-08-16 MED ORDER — ALBUTEROL SULFATE (2.5 MG/3ML) 0.083% IN NEBU: 2.5000 mg | INHALATION_SOLUTION | Freq: Four times a day (QID) | RESPIRATORY_TRACT | Status: DC | PRN

## 2017-08-16 MED ORDER — GABAPENTIN 600 MG PO TABS
300.0000 mg | ORAL_TABLET | Freq: Three times a day (TID) | ORAL | Status: DC
  Administered 2017-08-16 – 2017-08-31 (×46): 300 mg via ORAL
  Filled 2017-08-16 (×46): qty 1

## 2017-08-16 MED ORDER — FUROSEMIDE 10 MG/ML IJ SOLN
40.0000 mg | Freq: Once | INTRAMUSCULAR | Status: AC
  Administered 2017-08-16: 40 mg via INTRAVENOUS
  Filled 2017-08-16: qty 4

## 2017-08-16 MED ORDER — OXYCODONE HCL 5 MG PO TABS
5.0000 mg | ORAL_TABLET | ORAL | Status: DC | PRN
  Administered 2017-08-18 – 2017-08-31 (×38): 10 mg via ORAL
  Filled 2017-08-16 (×40): qty 2

## 2017-08-16 MED ORDER — METHOCARBAMOL 500 MG PO TABS
500.0000 mg | ORAL_TABLET | Freq: Three times a day (TID) | ORAL | Status: DC | PRN
  Administered 2017-08-16 – 2017-08-26 (×12): 500 mg via ORAL
  Filled 2017-08-16 (×13): qty 1

## 2017-08-16 MED ORDER — VITAMIN D 1000 UNITS PO TABS
1000.0000 [IU] | ORAL_TABLET | Freq: Every day | ORAL | Status: DC
  Administered 2017-08-16 – 2017-08-31 (×15): 1000 [IU] via ORAL
  Filled 2017-08-16 (×15): qty 1

## 2017-08-16 MED ORDER — METOPROLOL SUCCINATE ER 50 MG PO TB24
150.0000 mg | ORAL_TABLET | Freq: Every day | ORAL | Status: DC
  Administered 2017-08-16 – 2017-08-31 (×16): 150 mg via ORAL
  Filled 2017-08-16 (×2): qty 2
  Filled 2017-08-16 (×2): qty 1
  Filled 2017-08-16: qty 2
  Filled 2017-08-16: qty 1.5
  Filled 2017-08-16 (×4): qty 1
  Filled 2017-08-16: qty 2
  Filled 2017-08-16 (×2): qty 1
  Filled 2017-08-16 (×2): qty 2
  Filled 2017-08-16: qty 1

## 2017-08-16 MED ORDER — METOPROLOL SUCCINATE ER 50 MG PO TB24: 50.0000 mg | ORAL_TABLET | Freq: Every day | ORAL | Status: DC

## 2017-08-16 MED ORDER — ACETAMINOPHEN 500 MG PO TABS
1000.0000 mg | ORAL_TABLET | Freq: Three times a day (TID) | ORAL | Status: DC
  Administered 2017-08-16 – 2017-08-31 (×44): 1000 mg via ORAL
  Filled 2017-08-16 (×44): qty 2
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
## 2017-08-16 NOTE — Evaluation (Signed)
Occupational Therapy Evaluation Patient Details Name: Corey Mathis MRN: [id number] DOB: 02-22-1951 Today's Date: 08/16/2017   Complete note to follow. At this time, OT recommending SNF placement post-acute care for therapy needs.   [place] 08/16/2017, 3:18 PM
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
## 2017-08-16 NOTE — Progress Notes (Signed)
Subjective: 3 Days Post-Op Procedure(s) (LRB): OPEN REDUCTION INTERNAL FIXATION (ORIF) TIBIAL PLATEAU (Left) Patient reports pain as mild.  Minimal pain to BLE.  Changed rooms overnight  Objective: Vital signs in last 24 hours: Temp:  [98.2 F (36.8 C)-100.6 F (38.1 C)] 98.2 F (36.8 C) (06/13 0751) Pulse Rate:  [80-109] 94 (06/13 0751) Resp:  [15-24] 21 (06/13 0751) BP: (123-167)/(64-89) 166/88 (06/13 0751) SpO2:  [96 %-100 %] 100 % (06/13 0751)  Intake/Output from previous day: 06/12 0701 - 06/13 0700 In: 1056 [I.V.:1056] Out: 1100 [Urine:1100] Intake/Output this shift: No intake/output data recorded.  Recent Labs    08/14/17 0327 08/15/17 0045 08/15/17 1018 08/16/17 0401  HGB 7.4* 8.3* 8.7* 9.3*   Recent Labs    08/15/17 1018 08/16/17 0401  WBC 7.6 7.6  RBC 2.90* 3.06*  HCT 26.3* 27.9*  PLT 111* 137*   Recent Labs    08/14/17 0327  NA 142  K 3.7  CL 108  CO2 29  BUN 12  CREATININE 0.81  GLUCOSE 124*  CALCIUM 7.7*   Recent Labs    08/14/17 0327  INR 1.13    Neurologically intact Neurovascular intact Sensation intact distally Intact pulses distally Dorsiflexion/Plantar flexion intact Incision: moderate drainage No cellulitis present Compartment soft  LLE- no drainage to ace bandage.  Calf is soft and nt.  Compartments are soft. RLE- ex fix in place.  serosang saturation to distal pinsite.  Calf and compartments are soft and nt.  Still marked swelling to distal leg with multiple fracture blisters    Assessment/Plan: 3 Days Post-Op Procedure(s) (LRB): OPEN REDUCTION INTERNAL FIXATION (ORIF) TIBIAL PLATEAU (Left) Up with therapy  LLE-NWB. PLEASE START ROM OF THE LEFT KNEE RLE-NWB.  Continue pinsite care.  Too much swelling to extremity to proceed with def tx this week.  Hopeful to do this next week     Corey Mathis 08/16/2017, 8:00 AM
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
## 2017-08-17 ENCOUNTER — Inpatient Hospital Stay (HOSPITAL_COMMUNITY): Payer: Medicare Other

## 2017-08-17 LAB — HEPARIN LEVEL (UNFRACTIONATED)
HEPARIN UNFRACTIONATED: 0.26 [IU]/mL — AB (ref 0.30–0.70)
Heparin Unfractionated: 0.17 IU/mL — ABNORMAL LOW (ref 0.30–0.70)

## 2017-08-17 LAB — CBC
HCT: 28.6 % — ABNORMAL LOW (ref 39.0–52.0)
Hemoglobin: 9.5 g/dL — ABNORMAL LOW (ref 13.0–17.0)
MCH: 29.5 pg (ref 26.0–34.0)
MCHC: 33.2 g/dL (ref 30.0–36.0)
MCV: 88.8 fL (ref 78.0–100.0)
PLATELETS: 162 10*3/uL (ref 150–400)
RBC: 3.22 MIL/uL — ABNORMAL LOW (ref 4.22–5.81)
RDW: 15.8 % — ABNORMAL HIGH (ref 11.5–15.5)
WBC: 8.1 10*3/uL (ref 4.0–10.5)

## 2017-08-17 LAB — BASIC METABOLIC PANEL
ANION GAP: 5 (ref 5–15)
BUN: 11 mg/dL (ref 6–20)
CO2: 27 mmol/L (ref 22–32)
Calcium: 7.9 mg/dL — ABNORMAL LOW (ref 8.9–10.3)
Chloride: 105 mmol/L (ref 101–111)
Creatinine, Ser: 0.83 mg/dL (ref 0.61–1.24)
GFR calc Af Amer: >60 mL/min (ref >60)
GLUCOSE: 103 mg/dL — AB (ref 65–99)
Potassium: 3.1 mmol/L — ABNORMAL LOW (ref 3.5–5.1)
Sodium: 137 mmol/L (ref 135–145)

## 2017-08-17 MED ORDER — POTASSIUM CHLORIDE CRYS ER 20 MEQ PO TBCR
40.0000 meq | EXTENDED_RELEASE_TABLET | Freq: Once | ORAL | Status: AC
  Administered 2017-08-17: 40 meq via ORAL
  Filled 2017-08-17: qty 2

## 2017-08-17 NOTE — Consult Note (Signed)
[facility] Nurse wound consult note Appreciate involvement of Plastic Surgery.  WOC Nursing will sign off.  [place] nursing team will not follow, but will remain available to this patient, the nursing and medical teams.  Please re-consult if needed. Thanks, Maudie Flakes, MSN, RN, [place], Arther Abbott  Pager# [phone]
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
## 2017-08-18 ENCOUNTER — Inpatient Hospital Stay (HOSPITAL_COMMUNITY): Payer: Medicare Other

## 2017-08-18 LAB — CBC
HCT: 29.9 % — ABNORMAL LOW (ref 39.0–52.0)
HEMOGLOBIN: 9.5 g/dL — AB (ref 13.0–17.0)
MCH: 28.8 pg (ref 26.0–34.0)
MCHC: 31.8 g/dL (ref 30.0–36.0)
MCV: 90.6 fL (ref 78.0–100.0)
PLATELETS: 202 10*3/uL (ref 150–400)
RBC: 3.3 MIL/uL — ABNORMAL LOW (ref 4.22–5.81)
RDW: 16 % — ABNORMAL HIGH (ref 11.5–15.5)
WBC: 8 10*3/uL (ref 4.0–10.5)

## 2017-08-18 LAB — BASIC METABOLIC PANEL
ANION GAP: 9 (ref 5–15)
BUN: 12 mg/dL (ref 6–20)
CHLORIDE: 101 mmol/L (ref 101–111)
CO2: 26 mmol/L (ref 22–32)
Calcium: 8.5 mg/dL — ABNORMAL LOW (ref 8.9–10.3)
Creatinine, Ser: 0.79 mg/dL (ref 0.61–1.24)
GFR calc Af Amer: >60 mL/min (ref >60)
GLUCOSE: 148 mg/dL — AB (ref 65–99)
Potassium: 3.9 mmol/L (ref 3.5–5.1)
SODIUM: 136 mmol/L (ref 135–145)

## 2017-08-18 LAB — HEPARIN LEVEL (UNFRACTIONATED)
HEPARIN UNFRACTIONATED: 0.32 [IU]/mL (ref 0.30–0.70)
Heparin Unfractionated: 0.36 IU/mL (ref 0.30–0.70)

## 2017-08-18 MED ORDER — POTASSIUM CHLORIDE CRYS ER 20 MEQ PO TBCR
20.0000 meq | EXTENDED_RELEASE_TABLET | Freq: Two times a day (BID) | ORAL | Status: DC
  Administered 2017-08-18 – 2017-08-20 (×6): 20 meq via ORAL
  Filled 2017-08-18 (×7): qty 1

## 2017-08-18 MED ORDER — ORAL CARE MOUTH RINSE
15.0000 mL | Freq: Two times a day (BID) | OROMUCOSAL | Status: DC
  Administered 2017-08-18 – 2017-08-31 (×21): 15 mL via OROMUCOSAL

## 2017-08-18 MED ORDER — MORPHINE SULFATE (PF) 2 MG/ML IV SOLN
2.0000 mg | INTRAVENOUS | Status: DC | PRN
  Administered 2017-08-18 (×2): 2 mg via INTRAVENOUS
  Administered 2017-08-19 – 2017-08-20 (×5): 4 mg via INTRAVENOUS
  Administered 2017-08-21: 2 mg via INTRAVENOUS
  Administered 2017-08-21 – 2017-08-22 (×4): 4 mg via INTRAVENOUS
  Administered 2017-08-22: 2 mg via INTRAVENOUS
  Administered 2017-08-22 – 2017-08-24 (×4): 4 mg via INTRAVENOUS
  Administered 2017-08-24 – 2017-08-29 (×2): 2 mg via INTRAVENOUS
  Filled 2017-08-18: qty 1
  Filled 2017-08-18 (×4): qty 2
  Filled 2017-08-18 (×2): qty 1
  Filled 2017-08-18 (×4): qty 2
  Filled 2017-08-18 (×2): qty 1
  Filled 2017-08-18 (×8): qty 2

## 2017-08-18 NOTE — Progress Notes (Signed)
This morning, pt's RT lower pin site on external fixator had copious amounts of serosanguinous drainage. This was after the initial shift dressing change at 2315. Changed gauze around pin sites again at 0630.
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
## 2017-08-19 ENCOUNTER — Inpatient Hospital Stay (HOSPITAL_COMMUNITY): Payer: Medicare Other

## 2017-08-19 LAB — HEPARIN LEVEL (UNFRACTIONATED)
HEPARIN UNFRACTIONATED: 0.26 [IU]/mL — AB (ref 0.30–0.70)
Heparin Unfractionated: 0.46 [IU]/mL (ref 0.30–0.70)
Heparin Unfractionated: 0.21 [IU]/mL — ABNORMAL LOW (ref 0.30–0.70)

## 2017-08-19 LAB — BASIC METABOLIC PANEL
Anion gap: 6 (ref 5–15)
BUN: 12 mg/dL (ref 6–20)
CO2: 28 mmol/L (ref 22–32)
CREATININE: 0.78 mg/dL (ref 0.61–1.24)
Calcium: 8.6 mg/dL — ABNORMAL LOW (ref 8.9–10.3)
Chloride: 101 mmol/L (ref 101–111)
GFR calc Af Amer: >60 mL/min (ref >60)
GFR calc non Af Amer: >60 mL/min (ref >60)
GLUCOSE: 112 mg/dL — AB (ref 65–99)
Potassium: 4.3 mmol/L (ref 3.5–5.1)
Sodium: 135 mmol/L (ref 135–145)

## 2017-08-19 LAB — CBC
HEMATOCRIT: 30.3 % — AB (ref 39.0–52.0)
Hemoglobin: 9.8 g/dL — ABNORMAL LOW (ref 13.0–17.0)
MCH: 29.5 pg (ref 26.0–34.0)
MCHC: 32.3 g/dL (ref 30.0–36.0)
MCV: 91.3 fL (ref 78.0–100.0)
Platelets: 235 10*3/uL (ref 150–400)
RBC: 3.32 MIL/uL — ABNORMAL LOW (ref 4.22–5.81)
RDW: 15.9 % — ABNORMAL HIGH (ref 11.5–15.5)
WBC: 8.5 10*3/uL (ref 4.0–10.5)

## 2017-08-19 MED ORDER — POLYETHYLENE GLYCOL 3350 17 G PO PACK
17.0000 g | PACK | Freq: Two times a day (BID) | ORAL | Status: DC
  Administered 2017-08-19 – 2017-08-31 (×23): 17 g via ORAL
  Filled 2017-08-19 (×24): qty 1
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
## 2017-08-19 NOTE — Progress Notes (Signed)
Heparin increased to 19 units/hr per pharmacy recommendation.

## 2017-08-19 NOTE — Progress Notes (Signed)
Lower pin site of ex fix did not have as much drainage as previous night shift. Amount was scant and no drainage on the upper site. Both leg dressings as well as posterior dressing changed.   6720: Received call from pharmacy regarding pt's heparin levels (0.26). Drip to be increased to 17 mL/hr.

## 2017-08-19 NOTE — Progress Notes (Signed)
1900: Handoff report received from RN. Pt resting in bed. Discussed plan of care for the shift; pt amenable to plan.  0000: Pt resting comfortably.  0400: Pt continues resting comfortably.  0700: Handoff report given to RN. No acute events overnight.  
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
## 2017-08-19 NOTE — Progress Notes (Signed)
ANTICOAGULATION CONSULT NOTE - Follow Up Consult  Pharmacy Consult for heparin Indication: MVR  Labs: Recent Labs    08/17/17 0410  08/18/17 0232 08/18/17 1005 08/19/17 0408  HGB 9.5*  --   --  9.5* 9.8*  HCT 28.6*  --   --  29.9* 30.3*  PLT 162  --   --  202 235  HEPARINUNFRC 0.17*   < > 0.32 0.36 0.26*  CREATININE 0.83  --   --  0.79 0.78   < > = values in this interval not displayed.    Assessment: 67yo male subtherapeutic on heparin after two levels at lower end of goal; RN notes no gtt issues or signs of bleeding; Hgb remains low but stable.  Goal of Therapy:  Heparin level 0.3-0.7 units/ml   Plan:  Will increase heparin gtt by 1-2 units/kg/hr to 1700 units/hr and check level in 6 hours.    Wynona Neat, PharmD, BCPS  08/19/2017,5:23 AM

## 2017-08-20 LAB — CBC
HEMATOCRIT: 31.2 % — AB (ref 39.0–52.0)
HEMOGLOBIN: 9.9 g/dL — AB (ref 13.0–17.0)
MCH: 29.2 pg (ref 26.0–34.0)
MCHC: 31.7 g/dL (ref 30.0–36.0)
MCV: 92 fL (ref 78.0–100.0)
Platelets: 273 10*3/uL (ref 150–400)
RBC: 3.39 MIL/uL — ABNORMAL LOW (ref 4.22–5.81)
RDW: 16 % — AB (ref 11.5–15.5)
WBC: 10.5 10*3/uL (ref 4.0–10.5)

## 2017-08-20 LAB — HEPARIN LEVEL (UNFRACTIONATED)
Heparin Unfractionated: 0.59 [IU]/mL (ref 0.30–0.70)
Heparin Unfractionated: >2.2 [IU]/mL — ABNORMAL HIGH (ref 0.30–0.70)
Heparin Unfractionated: 0.86 [IU]/mL — ABNORMAL HIGH (ref 0.30–0.70)
Heparin Unfractionated: 0.31 [IU]/mL (ref 0.30–0.70)

## 2017-08-20 MED ORDER — HYDROCHLOROTHIAZIDE 25 MG PO TABS
25.0000 mg | ORAL_TABLET | Freq: Every day | ORAL | Status: DC
  Administered 2017-08-20 – 2017-08-31 (×11): 25 mg via ORAL
  Filled 2017-08-20 (×11): qty 1

## 2017-08-20 MED ORDER — MAGNESIUM CITRATE PO SOLN
1.0000 | Freq: Once | ORAL | Status: AC
  Administered 2017-08-20: 1 via ORAL
  Filled 2017-08-20: qty 296

## 2017-08-20 MED ORDER — ERYTHROMYCIN 5 MG/GM OP OINT
TOPICAL_OINTMENT | Freq: Four times a day (QID) | OPHTHALMIC | Status: DC
  Administered 2017-08-20 – 2017-08-27 (×26): via OPHTHALMIC
  Administered 2017-08-27: 1 via OPHTHALMIC
  Administered 2017-08-28 – 2017-08-31 (×15): via OPHTHALMIC
  Filled 2017-08-20: qty 3.5
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
## 2017-08-20 NOTE — Progress Notes (Signed)
ANTICOAGULATION CONSULT NOTE - Follow Up Consult  Pharmacy Consult for Heparin Indication: MVR  Allergies  Allergen Reactions  . Lisinopril Shortness Of Breath  . Codeine Nausea Only    Patient Measurements: Height: 6\' 4"  (193 cm) Weight: 222 lb 0.1 oz (100.7 kg) IBW/kg (Calculated) : 86.8 Heparin Dosing Weight:  100.7 kg  Vital Signs: Temp: 98.2 F (36.8 C) (06/17 2004) Temp Source: Oral (06/17 2004) BP: 141/92 (06/17 2004) Pulse Rate: 89 (06/17 2004)  Labs: Recent Labs    08/18/17 1005 08/19/17 0408  08/20/17 1149 08/20/17 1415 08/20/17 2056  HGB 9.5* 9.8*  --  9.9*  --   --   HCT 29.9* 30.3*  --  31.2*  --   --   PLT 202 235  --  273  --   --   HEPARINUNFRC 0.36 0.26*   < > >2.20* 0.86* 0.31  CREATININE 0.79 0.78  --   --   --   --    < > = values in this interval not displayed.    Estimated Creatinine Clearance: 107 mL/min (by C-G formula based on SCr of 0.78 mg/dL).  Assessment:  Anticoag: MVR on Coumadin PTA. Heparin level elevated earlier now down to 0.31 in goal.  Goal of Therapy:  Heparin level 0.3-0.7 units/ml Monitor platelets by anticoagulation protocol: Yes   Plan:  Continue heparin at 1700 units/hr Monitor daily heparin level and CBC,   Chinara Hertzberg S. Alford Highland, PharmD, BCPS Clinical Staff Pharmacist Pager [phone]  Eilene Ghazi Stillinger 08/20/2017,9:53 PM
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
## 2017-08-20 NOTE — Progress Notes (Signed)
   Subjective:  Patient reports pain as mild.  Doing PT session  Objective:   VITALS:   Vitals:   08/20/17 0000 08/20/17 0357 08/20/17 0400 08/20/17 0800  BP: (!) 155/96  (!) 172/104 (!) 146/89  Pulse: (!) 101  (!) 109 89  Resp: 16  15 (!) 21  Temp:  98.9 F (37.2 C)  98.3 F (36.8 C)  TempSrc:  Oral  Oral  SpO2: 98%  100% 100%  Weight:      Height:        RLE - ex fix pins hemostatic - swelling improved but fracture blisters still present  LLE - dressings c/d/i - compartments soft   Lab Results  Component Value Date   WBC 8.5 08/19/2017   HGB 9.8 (L) 08/19/2017   HCT 30.3 (L) 08/19/2017   MCV 91.3 08/19/2017   PLT 235 08/19/2017     Assessment/Plan:  7 Days Post-Op   - Expected postop acute blood loss anemia - will monitor for symptoms - Up with PT/OT - NWB BLE - Left knee ROM as tolerated - will continue to follow swelling - tentative plan for ORIF this Dalene Carrow 08/20/2017, 9:35 AM [phone]
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
## 2017-08-21 LAB — CBC
HEMATOCRIT: 33.8 % — AB (ref 39.0–52.0)
HEMOGLOBIN: 10.5 g/dL — AB (ref 13.0–17.0)
MCH: 28.8 pg (ref 26.0–34.0)
MCHC: 31.1 g/dL (ref 30.0–36.0)
MCV: 92.9 fL (ref 78.0–100.0)
Platelets: 322 10*3/uL (ref 150–400)
RBC: 3.64 MIL/uL — ABNORMAL LOW (ref 4.22–5.81)
RDW: 15.7 % — AB (ref 11.5–15.5)
WBC: 11.2 10*3/uL — ABNORMAL HIGH (ref 4.0–10.5)

## 2017-08-21 LAB — BASIC METABOLIC PANEL
Anion gap: 11 (ref 5–15)
BUN: 17 mg/dL (ref 6–20)
CALCIUM: 9.1 mg/dL (ref 8.9–10.3)
CHLORIDE: 95 mmol/L — AB (ref 101–111)
CO2: 29 mmol/L (ref 22–32)
CREATININE: 0.84 mg/dL (ref 0.61–1.24)
GFR calc non Af Amer: >60 mL/min (ref >60)
GLUCOSE: 115 mg/dL — AB (ref 65–99)
Potassium: 4.8 mmol/L (ref 3.5–5.1)
Sodium: 135 mmol/L (ref 135–145)

## 2017-08-21 LAB — HEPARIN LEVEL (UNFRACTIONATED): HEPARIN UNFRACTIONATED: 0.33 [IU]/mL (ref 0.30–0.70)

## 2017-08-21 MED ORDER — SODIUM CHLORIDE 0.9 % IV SOLN
1500.0000 mg | INTRAVENOUS | Status: AC
  Administered 2017-08-22: 1500 mg via INTRAVENOUS
  Filled 2017-08-21: qty 1500

## 2017-08-21 NOTE — Progress Notes (Signed)
1900: Handoff report received from RN. Pt resting in bed. Discussed plan of care for the shift; pt amenable to plan.  0000: Pt resting comfortably.  0400: Pt continues resting comfortably.  0700: Handoff report given to RN. No acute events overnight.  
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
## 2017-08-21 NOTE — Progress Notes (Signed)
Subjective: 8 Days Post-Op Procedure(s) (LRB): OPEN REDUCTION INTERNAL FIXATION (ORIF) TIBIAL PLATEAU (Left) Patient reports pain as mild.    Objective: Vital signs in last 24 hours: Temp:  [98.1 F (36.7 C)-98.6 F (37 C)] 98.1 F (36.7 C) (06/18 0800) Pulse Rate:  [89-96] 93 (06/18 0400) Resp:  [14-19] 15 (06/18 0400) BP: (133-141)/(83-93) 136/93 (06/18 0400) SpO2:  [95 %-100 %] 99 % (06/18 0400) Weight:  [222 lb 0.1 oz (100.7 kg)] 222 lb 0.1 oz (100.7 kg) (06/17 1400)  Intake/Output from previous day: 06/17 0701 - 06/18 0700 In: 908.3 [P.O.:480; I.V.:428.3] Out: 4225 [Urine:4225] Intake/Output this shift: No intake/output data recorded.  Recent Labs    08/19/17 0408 08/20/17 1149 08/21/17 0402  HGB 9.8* 9.9* 10.5*   Recent Labs    08/20/17 1149 08/21/17 0402  WBC 10.5 11.2*  RBC 3.39* 3.64*  HCT 31.2* 33.8*  PLT 273 322   Recent Labs    08/19/17 0408 08/21/17 0402  NA 135 135  K 4.3 4.8  CL 101 95*  CO2 28 29  BUN 12 17  CREATININE 0.78 0.84  GLUCOSE 112* 115*  CALCIUM 8.6* 9.1   No results for input(s): LABPT, INR in the last 72 hours.  Neurologically intact Neurovascular intact Sensation intact distally Intact pulses distally Dorsiflexion/Plantar flexion intact Incision: dressing C/D/I No cellulitis present Compartment soft  LLE-compartments soft.  Calf soft and nt RLE-no drainage to pinsites.  Compartments soft.  Still has some fracture blisters.  Calf is soft and nt    Assessment/Plan: 8 Days Post-Op Procedure(s) (LRB): OPEN REDUCTION INTERNAL FIXATION (ORIF) TIBIAL PLATEAU (Left) Up with therapy  LLE-NWB.  Continue knee ROM RLE- NWB.  Ex-fix in place.  Continue pin site care.  Will plan for definitive tx tomorrow NPO after midnight.  Please hold heparin after 6am tomorrow morning    Aundra Dubin 08/21/2017, 10:25 AM
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
## 2017-08-22 ENCOUNTER — Inpatient Hospital Stay (HOSPITAL_COMMUNITY): Payer: Medicare Other | Admitting: Certified Registered"

## 2017-08-22 ENCOUNTER — Inpatient Hospital Stay (HOSPITAL_COMMUNITY): Payer: Medicare Other

## 2017-08-22 ENCOUNTER — Encounter (HOSPITAL_COMMUNITY): Payer: Self-pay | Admitting: Orthopedic Surgery

## 2017-08-22 ENCOUNTER — Encounter (HOSPITAL_COMMUNITY): Admission: EM | Disposition: A | Discharge status: Skilled Nursing Facility | Payer: Self-pay | Source: Home / Self Care

## 2017-08-22 DIAGNOSIS — S82141A Displaced bicondylar fracture of right tibia, initial encounter for closed fracture: Secondary | ICD-10-CM

## 2017-08-22 HISTORY — PX: ORIF TIBIA PLATEAU: SHX2132

## 2017-08-22 LAB — BASIC METABOLIC PANEL
Anion gap: 10 (ref 5–15)
BUN: 20 mg/dL (ref 6–20)
CO2: 29 mmol/L (ref 22–32)
Calcium: 9 mg/dL (ref 8.9–10.3)
Chloride: 94 mmol/L — ABNORMAL LOW (ref 101–111)
Creatinine, Ser: 1.01 mg/dL (ref 0.61–1.24)
Glucose, Bld: 119 mg/dL — ABNORMAL HIGH (ref 65–99)
POTASSIUM: 4.7 mmol/L (ref 3.5–5.1)
SODIUM: 133 mmol/L — AB (ref 135–145)

## 2017-08-22 LAB — CBC
HCT: 32 % — ABNORMAL LOW (ref 39.0–52.0)
HEMOGLOBIN: 10.3 g/dL — AB (ref 13.0–17.0)
MCH: 29.5 pg (ref 26.0–34.0)
MCHC: 32.2 g/dL (ref 30.0–36.0)
MCV: 91.7 fL (ref 78.0–100.0)
PLATELETS: 319 10*3/uL (ref 150–400)
RBC: 3.49 MIL/uL — AB (ref 4.22–5.81)
RDW: 15.2 % (ref 11.5–15.5)
WBC: 15.2 10*3/uL — ABNORMAL HIGH (ref 4.0–10.5)

## 2017-08-22 LAB — HEPARIN LEVEL (UNFRACTIONATED): HEPARIN UNFRACTIONATED: 0.5 [IU]/mL (ref 0.30–0.70)

## 2017-08-22 SURGERY — OPEN REDUCTION INTERNAL FIXATION (ORIF) TIBIAL PLATEAU
Anesthesia: General | Site: Leg Lower | Laterality: Right

## 2017-08-22 MED ORDER — 0.9 % SODIUM CHLORIDE (POUR BTL) OPTIME
TOPICAL | Status: DC | PRN
  Administered 2017-08-22: 1000 mL

## 2017-08-22 MED ORDER — VANCOMYCIN HCL 1000 MG IV SOLR
INTRAVENOUS | Status: DC | PRN
  Administered 2017-08-22: 1000 mg via TOPICAL

## 2017-08-22 MED ORDER — PHENYLEPHRINE 40 MCG/ML (10ML) SYRINGE FOR IV PUSH (FOR BLOOD PRESSURE SUPPORT)
PREFILLED_SYRINGE | INTRAVENOUS | Status: DC | PRN
  Administered 2017-08-22: 120 ug via INTRAVENOUS
  Administered 2017-08-22: 80 ug via INTRAVENOUS
  Administered 2017-08-22 (×2): 40 ug via INTRAVENOUS

## 2017-08-22 MED ORDER — PHENYLEPHRINE 40 MCG/ML (10ML) SYRINGE FOR IV PUSH (FOR BLOOD PRESSURE SUPPORT)
PREFILLED_SYRINGE | INTRAVENOUS | Status: AC
  Filled 2017-08-22: qty 10

## 2017-08-22 MED ORDER — PROPOFOL 10 MG/ML IV BOLUS
INTRAVENOUS | Status: AC
  Filled 2017-08-22: qty 20

## 2017-08-22 MED ORDER — MEPERIDINE HCL 50 MG/ML IJ SOLN: 6.2500 mg | INTRAMUSCULAR | Status: DC | PRN

## 2017-08-22 MED ORDER — METOCLOPRAMIDE HCL 5 MG/ML IJ SOLN: 10.0000 mg | Freq: Once | INTRAMUSCULAR | Status: DC | PRN

## 2017-08-22 MED ORDER — MIDAZOLAM HCL 5 MG/5ML IJ SOLN
INTRAMUSCULAR | Status: DC | PRN
  Administered 2017-08-22 (×2): 1 mg via INTRAVENOUS

## 2017-08-22 MED ORDER — SODIUM CHLORIDE 0.9 % IJ SOLN
INTRAMUSCULAR | Status: AC
  Filled 2017-08-22: qty 20

## 2017-08-22 MED ORDER — SUGAMMADEX SODIUM 200 MG/2ML IV SOLN
INTRAVENOUS | Status: DC | PRN
  Administered 2017-08-22: 200 mg via INTRAVENOUS

## 2017-08-22 MED ORDER — FENTANYL CITRATE (PF) 250 MCG/5ML IJ SOLN
INTRAMUSCULAR | Status: AC
  Filled 2017-08-22: qty 5

## 2017-08-22 MED ORDER — MIDAZOLAM HCL 2 MG/2ML IJ SOLN
INTRAMUSCULAR | Status: AC
  Filled 2017-08-22: qty 2

## 2017-08-22 MED ORDER — TRANEXAMIC ACID 1000 MG/10ML IV SOLN
2000.0000 mg | Freq: Once | INTRAVENOUS | Status: AC
  Administered 2017-08-22: 2000 mg via TOPICAL
  Filled 2017-08-22: qty 20

## 2017-08-22 MED ORDER — ROCURONIUM BROMIDE 50 MG/5ML IV SOLN
INTRAVENOUS | Status: AC
  Filled 2017-08-22: qty 1

## 2017-08-22 MED ORDER — CEFAZOLIN SODIUM-DEXTROSE 2-4 GM/100ML-% IV SOLN
2.0000 g | Freq: Once | INTRAVENOUS | Status: AC
  Administered 2017-08-22: 2 g via INTRAVENOUS
  Filled 2017-08-22: qty 100

## 2017-08-22 MED ORDER — SUGAMMADEX SODIUM 200 MG/2ML IV SOLN
INTRAVENOUS | Status: AC
  Filled 2017-08-22: qty 2

## 2017-08-22 MED ORDER — DEXAMETHASONE SODIUM PHOSPHATE 10 MG/ML IJ SOLN
INTRAMUSCULAR | Status: AC
  Filled 2017-08-22: qty 1

## 2017-08-22 MED ORDER — VANCOMYCIN HCL 1000 MG IV SOLR
INTRAVENOUS | Status: AC
  Filled 2017-08-22: qty 1000

## 2017-08-22 MED ORDER — ONDANSETRON HCL 4 MG/2ML IJ SOLN
INTRAMUSCULAR | Status: DC | PRN
  Administered 2017-08-22: 4 mg via INTRAVENOUS

## 2017-08-22 MED ORDER — HYDROGEN PEROXIDE 3 % EX SOLN
CUTANEOUS | Status: DC | PRN
  Administered 2017-08-22: 1

## 2017-08-22 MED ORDER — LIDOCAINE 2% (20 MG/ML) 5 ML SYRINGE
INTRAMUSCULAR | Status: AC
  Filled 2017-08-22: qty 5

## 2017-08-22 MED ORDER — DEXAMETHASONE SODIUM PHOSPHATE 10 MG/ML IJ SOLN
INTRAMUSCULAR | Status: DC | PRN
  Administered 2017-08-22: 10 mg via INTRAVENOUS

## 2017-08-22 MED ORDER — ROCURONIUM BROMIDE 10 MG/ML (PF) SYRINGE
PREFILLED_SYRINGE | INTRAVENOUS | Status: DC | PRN
  Administered 2017-08-22: 20 mg via INTRAVENOUS
  Administered 2017-08-22: 50 mg via INTRAVENOUS
  Administered 2017-08-22: 30 mg via INTRAVENOUS

## 2017-08-22 MED ORDER — LIDOCAINE 2% (20 MG/ML) 5 ML SYRINGE
INTRAMUSCULAR | Status: DC | PRN
  Administered 2017-08-22: 100 mg via INTRAVENOUS

## 2017-08-22 MED ORDER — FENTANYL CITRATE (PF) 100 MCG/2ML IJ SOLN
25.0000 ug | INTRAMUSCULAR | Status: DC | PRN
  Administered 2017-08-22 (×2): 50 ug via INTRAVENOUS

## 2017-08-22 MED ORDER — FENTANYL CITRATE (PF) 250 MCG/5ML IJ SOLN
INTRAMUSCULAR | Status: DC | PRN
  Administered 2017-08-22 (×5): 50 ug via INTRAVENOUS
  Administered 2017-08-22: 100 ug via INTRAVENOUS

## 2017-08-22 MED ORDER — LACTATED RINGERS IV SOLN
INTRAVENOUS | Status: DC
  Administered 2017-08-22 (×3): via INTRAVENOUS

## 2017-08-22 MED ORDER — SORBITOL 70 % SOLN
960.0000 mL | TOPICAL_OIL | Freq: Once | ORAL | Status: DC
  Filled 2017-08-22 (×2): qty 473

## 2017-08-22 MED ORDER — PROPOFOL 10 MG/ML IV BOLUS
INTRAVENOUS | Status: DC | PRN
  Administered 2017-08-22: 40 mg via INTRAVENOUS
  Administered 2017-08-22: 150 mg via INTRAVENOUS
  Administered 2017-08-22: 30 mg via INTRAVENOUS
  Administered 2017-08-22: 50 mg via INTRAVENOUS

## 2017-08-22 MED ORDER — TRANEXAMIC ACID 1000 MG/10ML IV SOLN
1000.0000 mg | INTRAVENOUS | Status: AC
  Administered 2017-08-22: 1000 mg via INTRAVENOUS
  Filled 2017-08-22: qty 1100

## 2017-08-22 MED ORDER — FENTANYL CITRATE (PF) 100 MCG/2ML IJ SOLN
INTRAMUSCULAR | Status: AC
  Administered 2017-08-22: 50 ug via INTRAVENOUS
  Filled 2017-08-22: qty 2

## 2017-08-22 MED ORDER — CEFAZOLIN SODIUM 1 G IJ SOLR
INTRAMUSCULAR | Status: AC
  Filled 2017-08-22: qty 20

## 2017-08-22 SURGICAL SUPPLY — 74 items
BANDAGE ACE 6X5 VEL STRL LF (GAUZE/BANDAGES/DRESSINGS) ×3 IMPLANT
BANDAGE ESMARK 6X9 LF (GAUZE/BANDAGES/DRESSINGS) ×1 IMPLANT
BIT DRILL 2.5MM SMALL QC EVOS (BIT) IMPLANT
BLADE CLIPPER SURG (BLADE) IMPLANT
BNDG CMPR 9X6 STRL LF SNTH (GAUZE/BANDAGES/DRESSINGS) ×1
BNDG CMPR MED 15X6 ELC VLCR LF (GAUZE/BANDAGES/DRESSINGS) ×1
BNDG COHESIVE 6X5 TAN STRL LF (GAUZE/BANDAGES/DRESSINGS) ×3 IMPLANT
BNDG ELASTIC 6X15 VLCR STRL LF (GAUZE/BANDAGES/DRESSINGS) ×2 IMPLANT
BNDG ESMARK 6X9 LF (GAUZE/BANDAGES/DRESSINGS) ×3
COVER SURGICAL LIGHT HANDLE (MISCELLANEOUS) ×3 IMPLANT
DRAPE C-ARM 42X72 X-RAY (DRAPES) ×3 IMPLANT
DRAPE C-ARMOR (DRAPES) ×3 IMPLANT
DRAPE HALF SHEET 40X57 (DRAPES) ×3 IMPLANT
DRAPE IMP U-DRAPE 54X76 (DRAPES) ×10 IMPLANT
DRAPE INCISE IOBAN 66X45 STRL (DRAPES) ×3 IMPLANT
DRAPE ORTHO SPLIT 77X108 STRL (DRAPES) ×6
DRAPE SURG ORHT 6 SPLT 77X108 (DRAPES) ×2 IMPLANT
DRAPE U-SHAPE 47X51 STRL (DRAPES) ×3 IMPLANT
DRILL 2.5MM SMALL QC EVOS (BIT) ×3
ELECT REM PT RETURN 9FT ADLT (ELECTROSURGICAL) ×3
ELECTRODE REM PT RTRN 9FT ADLT (ELECTROSURGICAL) ×1 IMPLANT
FACESHIELD STD STERILE (MASK) ×3 IMPLANT
GAUZE SPONGE 4X4 12PLY STRL (GAUZE/BANDAGES/DRESSINGS) ×6 IMPLANT
GAUZE SPONGE 4X4 12PLY STRL LF (GAUZE/BANDAGES/DRESSINGS) ×3 IMPLANT
GAUZE XEROFORM 1X8 LF (GAUZE/BANDAGES/DRESSINGS) ×1 IMPLANT
GAUZE XEROFORM 5X9 LF (GAUZE/BANDAGES/DRESSINGS) ×6 IMPLANT
GLOVE BIOGEL PI IND STRL 7.0 (GLOVE) ×1 IMPLANT
GLOVE BIOGEL PI INDICATOR 7.0 (GLOVE) ×2
GLOVE ECLIPSE 7.0 STRL STRAW (GLOVE) ×3 IMPLANT
GLOVE SKINSENSE NS SZ7.5 (GLOVE) ×2
GLOVE SKINSENSE STRL SZ7.5 (GLOVE) ×1 IMPLANT
GLOVE SURG SYN 7.5  E (GLOVE) ×2
GLOVE SURG SYN 7.5 E (GLOVE) ×1 IMPLANT
GLOVE SURG SYN 7.5 PF PI (GLOVE) ×1 IMPLANT
GOWN STRL REIN XL XLG (GOWN DISPOSABLE) ×9 IMPLANT
HYDROGEN PEROXIDE 16OZ (MISCELLANEOUS) ×2 IMPLANT
IMMOBILIZER KNEE 22 (SOFTGOODS) ×2 IMPLANT
K-WIRE 1.6 (WIRE) ×12
K-WIRE FX150X1.6XTROC PNT (WIRE) ×4
KIT BASIN OR (CUSTOM PROCEDURE TRAY) ×3 IMPLANT
KIT TURNOVER KIT B (KITS) ×3 IMPLANT
KWIRE FX150X1.6XTROC PNT (WIRE) IMPLANT
MANIFOLD NEPTUNE II (INSTRUMENTS) ×3 IMPLANT
NDL SUT 6 .5 CRC .975X.05 MAYO (NEEDLE) IMPLANT
NEEDLE MAYO TAPER (NEEDLE)
NS IRRIG 1000ML POUR BTL (IV SOLUTION) ×9 IMPLANT
PACK GENERAL/GYN (CUSTOM PROCEDURE TRAY) ×3 IMPLANT
PAD ABD 8X10 STRL (GAUZE/BANDAGES/DRESSINGS) ×9 IMPLANT
PAD ARMBOARD 7.5X6 YLW CONV (MISCELLANEOUS) ×6 IMPLANT
PLATE TIBIA EVOS 3.5X200 16H (Plate) ×2 IMPLANT
PUTTY DBM STAGRAFT PLUS 5CC (Putty) ×2 IMPLANT
PUTTY DBX 5CC (Putty) ×2 IMPLANT
SCREW CORT 3.5X30 ST EVOS (Screw) ×2 IMPLANT
SCREW CORT EVOS ST 3.5X28 (Screw) ×2 IMPLANT
SCREW CTX 3.5X34MM EVOS (Screw) ×2 IMPLANT
SCREW CTX 3.5X36MM EVOS (Screw) ×2 IMPLANT
SCREW CTX 3.5X38MM EVOS (Screw) ×2 IMPLANT
SCREW LOCK EVOS ST 3.5X85 (Screw) ×4 IMPLANT
SCREW LOCK EVOS ST 3.5X90 (Screw) ×2 IMPLANT
SCREW LOCKING 3.5X75 (Screw) ×6 IMPLANT
SCREW OST EVOS PT 4.7X90 (Screw) ×2 IMPLANT
STAPLER VISISTAT 35W (STAPLE) IMPLANT
STOCKINETTE IMPERVIOUS LG (DRAPES) ×2 IMPLANT
SUT ETHILON 2 0 FS 18 (SUTURE) IMPLANT
SUT ETHILON 3 0 PS 1 (SUTURE) IMPLANT
SUT PDS AB 0 CT 36 (SUTURE) IMPLANT
SUT VIC AB 0 CT1 27 (SUTURE)
SUT VIC AB 0 CT1 27XBRD ANBCTR (SUTURE) IMPLANT
SUT VIC AB 2-0 CT1 27 (SUTURE) ×3
SUT VIC AB 2-0 CT1 TAPERPNT 27 (SUTURE) ×1 IMPLANT
SUT VICRYL AB 2 0 TIES (SUTURE) ×2 IMPLANT
TOWEL NATURAL 10PK STERILE (DISPOSABLE) ×2 IMPLANT
TOWEL OR 17X24 6PK STRL BLUE (TOWEL DISPOSABLE) ×3 IMPLANT
TOWEL OR 17X26 10 PK STRL BLUE (TOWEL DISPOSABLE) ×6 IMPLANT

## 2017-08-22 NOTE — Anesthesia Preprocedure Evaluation (Signed)
Anesthesia Evaluation  Patient identified by MRN, date of birth, ID band Patient awake    Reviewed: Allergy & Precautions, NPO status , Patient's Chart, lab work & pertinent test results  Airway Mallampati: III  TM Distance: <3 FB Neck ROM: Full    Dental no notable dental hx.    Pulmonary asthma ,    Pulmonary exam normal breath sounds clear to auscultation       Cardiovascular hypertension, Pt. on medications and Pt. on home beta blockers Valvular problems/murmurs: h/o MVR.  Rhythm:Regular Rate:Tachycardia  H/O MV replacement   Neuro/Psych negative neurological ROS  negative psych ROS   GI/Hepatic negative GI ROS, Neg liver ROS,   Endo/Other  negative endocrine ROS  Renal/GU negative Renal ROS  negative genitourinary   Musculoskeletal negative musculoskeletal ROS (+)   Abdominal   Peds negative pediatric ROS (+)  Hematology  (+) anemia ,   Anesthesia Other Findings   Reproductive/Obstetrics negative OB ROS                             Anesthesia Physical  Anesthesia Plan  ASA: III  Anesthesia Plan: General   Post-op Pain Management:    Induction: Intravenous  PONV Risk Score and Plan: 2 and Ondansetron, Dexamethasone and Treatment may vary due to age or medical condition  Airway Management Planned: Oral ETT  Additional Equipment:   Intra-op Plan:   Post-operative Plan: Extubation in OR  Informed Consent: I have reviewed the patients History and Physical, chart, labs and discussed the procedure including the risks, benefits and alternatives for the proposed anesthesia with the patient or authorized representative who has indicated his/her understanding and acceptance.   Dental advisory given  Plan Discussed with: CRNA and Surgeon  Anesthesia Plan Comments:         Anesthesia Quick Evaluation
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
## 2017-08-22 NOTE — Transfer of Care (Signed)
Immediate Anesthesia Transfer of Care Note  Patient: Corey Mathis  Procedure(s) Performed: OPEN REDUCTION INTERNAL FIXATION (ORIF) RIGHT BICONDYLAR TIBIAL PLATEAU, REMOVAL OF EX FIX (Right Leg Lower)  Patient Location: PACU  Anesthesia Type:General  Level of Consciousness: drowsy and patient cooperative  Airway & Oxygen Therapy: Patient Spontanous Breathing and Patient connected to face mask oxygen  Post-op Assessment: Report given to RN, Post -op Vital signs reviewed and stable and Patient moving all extremities X 4  Post vital signs: Reviewed and stable  Last Vitals:  Vitals Value Taken Time  BP 168/92 08/22/2017 12:55 PM  Temp    Pulse 93 08/22/2017  1:00 PM  Resp 21 08/22/2017  1:00 PM  SpO2 100 % 08/22/2017  1:00 PM  Vitals shown include unvalidated device data.  Last Pain:  Vitals:   08/22/17 0811  TempSrc: Oral  PainSc:       Patients Stated Pain Goal: 2 (18/33/58 2518)  Complications: No apparent anesthesia complications

## 2017-08-22 NOTE — Progress Notes (Signed)
Patient transferred back to rm 4NP05, receiving RN in room, no further questions from patient or RN, patient placed on tele, family updated of pts location.  Rowe Pavy, RN
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
## 2017-08-22 NOTE — Anesthesia Postprocedure Evaluation (Signed)
Anesthesia Post Note  Patient: Corey Mathis  Procedure(s) Performed: OPEN REDUCTION INTERNAL FIXATION (ORIF) RIGHT BICONDYLAR TIBIAL PLATEAU, REMOVAL OF EX FIX (Right Leg Lower)     Patient location during evaluation: PACU Anesthesia Type: General Level of consciousness: awake and alert Pain management: pain level controlled Vital Signs Assessment: post-procedure vital signs reviewed and stable Respiratory status: spontaneous breathing, nonlabored ventilation, respiratory function stable and patient connected to nasal cannula oxygen Cardiovascular status: blood pressure returned to baseline and stable Postop Assessment: no apparent nausea or vomiting Anesthetic complications: no    Last Vitals:  Vitals:   08/22/17 0923 08/22/17 1255  BP: (!) 158/93 (!) 168/92  Pulse: (!) 106 96  Resp:  13  Temp:  36.9 C  SpO2:  92%    Last Pain:  Vitals:   08/22/17 0811  TempSrc: Oral  PainSc:                  [name]

## 2017-08-22 NOTE — Progress Notes (Signed)
Nurse contacted Pharmacy about resuming heparin after surgery. Heparin to restart 24 hours after surgery

## 2017-08-22 NOTE — H&P (Signed)

## 2017-08-22 NOTE — Anesthesia Procedure Notes (Signed)
Procedure Name: Intubation Date/Time: 08/22/2017 10:14 AM Performed by: Freddie Breech, CRNA Pre-anesthesia Checklist: Patient identified, Emergency Drugs available, Suction available and Patient being monitored Patient Re-evaluated:Patient Re-evaluated prior to induction Oxygen Delivery Method: Circle System Utilized Preoxygenation: Pre-oxygenation with 100% oxygen Induction Type: IV induction Ventilation: Mask ventilation without difficulty Laryngoscope Size: Glidescope and 4 Grade View: Grade I Tube type: Oral Tube size: 7.5 mm Number of attempts: 1 Airway Equipment and Method: Stylet,  Oral airway and Video-laryngoscopy Placement Confirmation: positive ETCO2 and breath sounds checked- equal and bilateral (ETT placed through open vocal cords using video laryngoscopy) Secured at: 24 cm Tube secured with: Tape Dental Injury: Teeth and Oropharynx as per pre-operative assessment
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
## 2017-08-23 LAB — CBC
HCT: 29.4 % — ABNORMAL LOW (ref 39.0–52.0)
Hemoglobin: 9.5 g/dL — ABNORMAL LOW (ref 13.0–17.0)
MCH: 29.3 pg (ref 26.0–34.0)
MCHC: 32.3 g/dL (ref 30.0–36.0)
MCV: 90.7 fL (ref 78.0–100.0)
PLATELETS: 342 10*3/uL (ref 150–400)
RBC: 3.24 MIL/uL — AB (ref 4.22–5.81)
RDW: 15 % (ref 11.5–15.5)
WBC: 14 10*3/uL — AB (ref 4.0–10.5)

## 2017-08-23 LAB — BASIC METABOLIC PANEL
Anion gap: 11 (ref 5–15)
BUN: 21 mg/dL — AB (ref 6–20)
CALCIUM: 8.7 mg/dL — AB (ref 8.9–10.3)
CO2: 28 mmol/L (ref 22–32)
Chloride: 96 mmol/L — ABNORMAL LOW (ref 101–111)
Creatinine, Ser: 0.83 mg/dL (ref 0.61–1.24)
Glucose, Bld: 138 mg/dL — ABNORMAL HIGH (ref 65–99)
Potassium: 4 mmol/L (ref 3.5–5.1)
Sodium: 135 mmol/L (ref 135–145)

## 2017-08-23 LAB — HEPARIN LEVEL (UNFRACTIONATED): HEPARIN UNFRACTIONATED: 0.15 [IU]/mL — AB (ref 0.30–0.70)

## 2017-08-23 MED ORDER — WARFARIN SODIUM 5 MG PO TABS
10.0000 mg | ORAL_TABLET | Freq: Once | ORAL | Status: AC
  Administered 2017-08-23: 10 mg via ORAL
  Filled 2017-08-23: qty 2

## 2017-08-23 MED ORDER — WARFARIN - PHARMACIST DOSING INPATIENT
Freq: Every day | Status: DC
  Administered 2017-08-23 – 2017-08-31 (×7)

## 2017-08-23 MED ORDER — HEPARIN (PORCINE) IN NACL 100-0.45 UNIT/ML-% IJ SOLN
1550.0000 [IU]/h | INTRAMUSCULAR | Status: DC
  Administered 2017-08-23: 1700 [IU]/h via INTRAVENOUS
  Administered 2017-08-24 – 2017-08-25 (×3): 1950 [IU]/h via INTRAVENOUS
  Administered 2017-08-25 – 2017-08-27 (×4): 1900 [IU]/h via INTRAVENOUS
  Administered 2017-08-28: 1750 [IU]/h via INTRAVENOUS
  Administered 2017-08-29 – 2017-08-31 (×4): 1550 [IU]/h via INTRAVENOUS
  Filled 2017-08-23 (×14): qty 250
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
## 2017-08-23 NOTE — Clinical Social Work Note (Addendum)
CSW provided patient with list of bed offers. His wife had just stepped out so he will give to her when she returns and have her call CSW with decision.  Dayton Scrape, CSW [phone]  3:45 pm CSW received call from patient's wife. She has reviewed the bed offers and definitely  does not want 4/5 of them. She last visited [facility] a year ago but is concerned with their 2 star rating. Patient's wife asked about [facility] facilities but CSW explained that they are typically full, taking their ALF/ILF residents first. Patient's wife asked that referral be sent out to surrounding counties. CSW asked if no one else offered a bed, if she would like to move forward with [facility]. She replied, "Let's see what you come up with." CSW sent referral to [place], [place], [place], and [place].  Dayton Scrape, [place]
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
## 2017-08-23 NOTE — Progress Notes (Signed)
Did well with surgery. Resuming anticoagulation [facility] will sign off.   Medication Recommendations:  Heparin IV to warfarin, target INR 2.5-3.5 Other recommendations (labs, testing, etc):  Daily INR until therapeutic range achieved Follow up as an outpatient:  Please call us at the time of DC to arrange anticoagulation clinic follow-up  Sanda Klein, MD, [facility] [phone] office [phone] pager
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
## 2017-08-23 NOTE — Progress Notes (Signed)
Subjective: 1 Day Post-Op Procedure(s) (LRB): OPEN REDUCTION INTERNAL FIXATION (ORIF) RIGHT BICONDYLAR TIBIAL PLATEAU, REMOVAL OF EX FIX (Right) Patient reports pain as mild.  Pain to BLE well controlled this am.  Objective: Vital signs in last 24 hours: Temp:  [97.7 F (36.5 C)-99.2 F (37.3 C)] 98.7 F (37.1 C) (06/20 0328) Pulse Rate:  [95-106] 104 (06/19 2310) Resp:  [13-25] 16 (06/19 2310) BP: (119-168)/(82-93) 119/91 (06/19 2310) SpO2:  [92 %-100 %] 93 % (06/19 2310) Weight:  [222 lb 0.1 oz (100.7 kg)] 222 lb 0.1 oz (100.7 kg) (06/19 0849)  Intake/Output from previous day: 06/19 0701 - 06/20 0700 In: 1547.8 [P.O.:120; I.V.:1427.8] Out: 2325 [Urine:2250; Blood:75] Intake/Output this shift: No intake/output data recorded.  Recent Labs    08/20/17 1149 08/21/17 0402 08/22/17 0336 08/23/17 0436  HGB 9.9* 10.5* 10.3* 9.5*   Recent Labs    08/22/17 0336 08/23/17 0436  WBC 15.2* 14.0*  RBC 3.49* 3.24*  HCT 32.0* 29.4*  PLT 319 342   Recent Labs    08/22/17 0336 08/23/17 0436  NA 133* 135  K 4.7 4.0  CL 94* 96*  CO2 29 28  BUN 20 21*  CREATININE 1.01 0.83  GLUCOSE 119* 138*  CALCIUM 9.0 8.7*   No results for input(s): LABPT, INR in the last 72 hours.  Neurologically intact Neurovascular intact Sensation intact distally Intact pulses distally Dorsiflexion/Plantar flexion intact Incision: dressing C/D/I No cellulitis present Compartment soft  LLE-compartments/calf soft and nt RLE- no drainage to bandage.  Knee immobilizer in place.  Calf and compartments soft and nt BLE-ehl/fhl intact    Assessment/Plan: 1 Day Post-Op Procedure(s) (LRB): OPEN REDUCTION INTERNAL FIXATION (ORIF) RIGHT BICONDYLAR TIBIAL PLATEAU, REMOVAL OF EX FIX (Right) Up with therapy  NWB BLE LLE-ok for ROM at knee RLE-NO ROM.  Knee immobilizer at all times OK to restart heparin at around 11am (24 hours post-op)    Aundra Dubin 08/23/2017, 7:33 AM

## 2017-08-24 ENCOUNTER — Encounter (HOSPITAL_COMMUNITY): Payer: Self-pay | Admitting: Orthopaedic Surgery

## 2017-08-24 ENCOUNTER — Other Ambulatory Visit: Payer: Self-pay

## 2017-08-24 LAB — PROTIME-INR
INR: 1.13
PROTHROMBIN TIME: 14.4 s (ref 11.4–15.2)

## 2017-08-24 LAB — BASIC METABOLIC PANEL
ANION GAP: 9 (ref 5–15)
BUN: 17 mg/dL (ref 6–20)
CALCIUM: 9 mg/dL (ref 8.9–10.3)
CO2: 29 mmol/L (ref 22–32)
Chloride: 97 mmol/L — ABNORMAL LOW (ref 101–111)
Creatinine, Ser: 0.98 mg/dL (ref 0.61–1.24)
GFR calc Af Amer: >60 mL/min (ref >60)
GFR calc non Af Amer: >60 mL/min (ref >60)
GLUCOSE: 114 mg/dL — AB (ref 65–99)
Potassium: 3.8 mmol/L (ref 3.5–5.1)
Sodium: 135 mmol/L (ref 135–145)

## 2017-08-24 LAB — CBC
HCT: 31 % — ABNORMAL LOW (ref 39.0–52.0)
HEMOGLOBIN: 9.8 g/dL — AB (ref 13.0–17.0)
MCH: 29.1 pg (ref 26.0–34.0)
MCHC: 31.6 g/dL (ref 30.0–36.0)
MCV: 92 fL (ref 78.0–100.0)
Platelets: 359 10*3/uL (ref 150–400)
RBC: 3.37 MIL/uL — ABNORMAL LOW (ref 4.22–5.81)
RDW: 15 % (ref 11.5–15.5)
WBC: 12 10*3/uL — ABNORMAL HIGH (ref 4.0–10.5)

## 2017-08-24 LAB — HEPARIN LEVEL (UNFRACTIONATED)
Heparin Unfractionated: 0.58 [IU]/mL (ref 0.30–0.70)
Heparin Unfractionated: 0.6 [IU]/mL (ref 0.30–0.70)

## 2017-08-24 MED ORDER — WARFARIN SODIUM 7.5 MG PO TABS
12.5000 mg | ORAL_TABLET | Freq: Once | ORAL | Status: AC
  Administered 2017-08-24: 12.5 mg via ORAL
  Filled 2017-08-24: qty 1
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
## 2017-08-24 NOTE — Clinical Social Work Note (Signed)
Clinical Social Worker spoke with patient wife over the phone and family has agreed to accept bed offer to [facility].  CSW contacted admissions coordinator who is in agreement with admission Monday or Tuesday pending patient medical stability.  Patient wife to be notified of discharge plan on Monday.  CSW remains available for support and to facilitate patient discharge needs once medically stable.  Barbette Or, [place]

## 2017-08-24 NOTE — Progress Notes (Signed)
Subjective: 2 Days Post-Op Procedure(s) (LRB): OPEN REDUCTION INTERNAL FIXATION (ORIF) RIGHT BICONDYLAR TIBIAL PLATEAU, REMOVAL OF EX FIX (Right) Patient reports pain as mild.  Doing well in regards to BLE  Objective: Vital signs in last 24 hours: Temp:  [98.1 F (36.7 C)-98.8 F (37.1 C)] 98.8 F (37.1 C) (06/21 0000) Pulse Rate:  [94-109] 96 (06/20 2114) Resp:  [16-19] 18 (06/20 2114) BP: (123-129)/(78-81) 123/79 (06/20 1944) SpO2:  [96 %-100 %] 100 % (06/20 2114) Weight:  [200 lb (90.7 kg)-205 lb 14.6 oz (93.4 kg)] 200 lb (90.7 kg) (06/21 0642)  Intake/Output from previous day: 06/20 0701 - 06/21 0700 In: 1019.1 [P.O.:780; I.V.:239.1] Out: 4000 [Urine:4000] Intake/Output this shift: No intake/output data recorded.  Recent Labs    08/22/17 0336 08/23/17 0436 08/24/17 0626  HGB 10.3* 9.5* 9.8*   Recent Labs    08/23/17 0436 08/24/17 0626  WBC 14.0* 12.0*  RBC 3.24* 3.37*  HCT 29.4* 31.0*  PLT 342 359   Recent Labs    08/23/17 0436 08/24/17 0626  NA 135 135  K 4.0 3.8  CL 96* 97*  CO2 28 29  BUN 21* 17  CREATININE 0.83 0.98  GLUCOSE 138* 114*  CALCIUM 8.7* 9.0   Recent Labs    08/24/17 0626  INR 1.13    Neurologically intact Neurovascular intact Sensation intact distally Intact pulses distally Dorsiflexion/Plantar flexion intact Incision: dressing C/D/I No cellulitis present Compartment soft  RLE-knee immobilizer in place.  No drainage to ace bandage.  Compartments and calf soft and NT LLE- calf soft and NT    Assessment/Plan: 2 Days Post-Op Procedure(s) (LRB): OPEN REDUCTION INTERNAL FIXATION (ORIF) RIGHT BICONDYLAR TIBIAL PLATEAU, REMOVAL OF EX FIX (Right) Up with therapy  NWB BLE RLE-knee immobilizer at all times. NWB and NO ROM LLE-NWB.  ROM at the knee only Ok from ortho to d/c to [facility] 08/24/2017, 7:52 AM

## 2017-08-24 NOTE — Clinical Social Work Note (Signed)
Clinical Social Worker continuing to follow patient and family for support and discharge planning needs.  CSW spoke with patient wife over the phone who requests that additional bed offers be left at bedside - CSW left offers with contact number to follow up about bed choice.  CSW remains available for support and to facilitate patient discharge needs.  Barbette Or, [place]
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
## 2017-08-25 LAB — CBC
HCT: 30.3 % — ABNORMAL LOW (ref 39.0–52.0)
HEMOGLOBIN: 9.7 g/dL — AB (ref 13.0–17.0)
MCH: 29 pg (ref 26.0–34.0)
MCHC: 32 g/dL (ref 30.0–36.0)
MCV: 90.7 fL (ref 78.0–100.0)
Platelets: 377 10*3/uL (ref 150–400)
RBC: 3.34 MIL/uL — AB (ref 4.22–5.81)
RDW: 14.8 % (ref 11.5–15.5)
WBC: 11.4 10*3/uL — ABNORMAL HIGH (ref 4.0–10.5)

## 2017-08-25 LAB — PROTIME-INR
INR: 1.16
Prothrombin Time: 14.7 s (ref 11.4–15.2)

## 2017-08-25 LAB — HEPARIN LEVEL (UNFRACTIONATED): Heparin Unfractionated: 0.64 [IU]/mL (ref 0.30–0.70)

## 2017-08-25 MED ORDER — WARFARIN SODIUM 7.5 MG PO TABS
12.5000 mg | ORAL_TABLET | Freq: Once | ORAL | Status: AC
  Administered 2017-08-25: 12.5 mg via ORAL
  Filled 2017-08-25: qty 1
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
## 2017-08-26 LAB — CBC
HEMATOCRIT: 33.2 % — AB (ref 39.0–52.0)
HEMOGLOBIN: 10.4 g/dL — AB (ref 13.0–17.0)
MCH: 28.7 pg (ref 26.0–34.0)
MCHC: 31.3 g/dL (ref 30.0–36.0)
MCV: 91.7 fL (ref 78.0–100.0)
Platelets: 469 10*3/uL — ABNORMAL HIGH (ref 150–400)
RBC: 3.62 MIL/uL — ABNORMAL LOW (ref 4.22–5.81)
RDW: 15.1 % (ref 11.5–15.5)
WBC: 9.4 10*3/uL (ref 4.0–10.5)

## 2017-08-26 LAB — HEPARIN LEVEL (UNFRACTIONATED): Heparin Unfractionated: 0.61 [IU]/mL (ref 0.30–0.70)

## 2017-08-26 LAB — PROTIME-INR
INR: 1.33
PROTHROMBIN TIME: 16.4 s — AB (ref 11.4–15.2)

## 2017-08-26 MED ORDER — WARFARIN SODIUM 5 MG PO TABS
10.0000 mg | ORAL_TABLET | Freq: Once | ORAL | Status: AC
  Administered 2017-08-26: 10 mg via ORAL
  Filled 2017-08-26: qty 2
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
## 2017-08-26 NOTE — Progress Notes (Signed)
0130: Pt complaining of burning on urination. Reported to oncoming nurse.
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
## 2017-08-27 LAB — CBC
HEMATOCRIT: 36.1 % — AB (ref 39.0–52.0)
HEMOGLOBIN: 11.2 g/dL — AB (ref 13.0–17.0)
MCH: 29.1 pg (ref 26.0–34.0)
MCHC: 31 g/dL (ref 30.0–36.0)
MCV: 93.8 fL (ref 78.0–100.0)
Platelets: 465 10*3/uL — ABNORMAL HIGH (ref 150–400)
RBC: 3.85 MIL/uL — ABNORMAL LOW (ref 4.22–5.81)
RDW: 14.8 % (ref 11.5–15.5)
WBC: 8 10*3/uL (ref 4.0–10.5)

## 2017-08-27 LAB — PROTIME-INR
INR: 1.52
Prothrombin Time: 18.1 s — ABNORMAL HIGH (ref 11.4–15.2)

## 2017-08-27 LAB — URINALYSIS, ROUTINE W REFLEX MICROSCOPIC
BILIRUBIN URINE: NEGATIVE
Glucose, UA: NEGATIVE mg/dL
Ketones, ur: NEGATIVE mg/dL
Nitrite: NEGATIVE
PH: 7 (ref 5.0–8.0)
Protein, ur: 30 mg/dL — AB
SPECIFIC GRAVITY, URINE: 1.01 (ref 1.005–1.030)

## 2017-08-27 LAB — URINALYSIS, MICROSCOPIC (REFLEX): WBC, UA: >50 WBC/hpf (ref 0–5)

## 2017-08-27 LAB — HEPARIN LEVEL (UNFRACTIONATED): Heparin Unfractionated: 0.56 [IU]/mL (ref 0.30–0.70)

## 2017-08-27 MED ORDER — ALPRAZOLAM 0.5 MG PO TABS
0.5000 mg | ORAL_TABLET | Freq: Four times a day (QID) | ORAL | Status: DC | PRN
  Administered 2017-08-27 – 2017-08-31 (×12): 0.5 mg via ORAL
  Filled 2017-08-27 (×11): qty 1

## 2017-08-27 MED ORDER — FLEET ENEMA 7-19 GM/118ML RE ENEM
1.0000 | ENEMA | Freq: Once | RECTAL | Status: AC
  Administered 2017-08-27: 1 via RECTAL
  Filled 2017-08-27: qty 1

## 2017-08-27 MED ORDER — WARFARIN SODIUM 7.5 MG PO TABS
12.5000 mg | ORAL_TABLET | Freq: Once | ORAL | Status: AC
  Administered 2017-08-27: 12.5 mg via ORAL
  Filled 2017-08-27: qty 1
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
## 2017-08-27 NOTE — Social Work (Signed)
CSW continuing to follow to support SNF discharge when medically appropriate.  Alexander Mt, [place] [facility] [phone]

## 2017-08-27 NOTE — Progress Notes (Signed)
Subjective: 5 Days Post-Op Procedure(s) (LRB): OPEN REDUCTION INTERNAL FIXATION (ORIF) RIGHT BICONDYLAR TIBIAL PLATEAU, REMOVAL OF EX FIX (Right) Patient reports pain as mild.  Doing well this am in regards to BLE.  C/o being constipated which is uncomfortable  Objective: Vital signs in last 24 hours: Temp:  [97.6 F (36.4 C)-98.6 F (37 C)] 98.3 F (36.8 C) (06/24 0430) Pulse Rate:  [92-117] 94 (06/24 0400) Resp:  [14-25] 25 (06/24 0400) BP: (135-165)/(89-116) 137/89 (06/24 0400) SpO2:  [93 %-98 %] 97 % (06/24 0400)  Intake/Output from previous day: 06/23 0701 - 06/24 0700 In: 602.1 [P.O.:360; I.V.:242.1] Out: 1600 [Urine:1600] Intake/Output this shift: No intake/output data recorded.  Recent Labs    08/25/17 0238 08/26/17 0316 08/27/17 0623  HGB 9.7* 10.4* 11.2*   Recent Labs    08/26/17 0316 08/27/17 0623  WBC 9.4 8.0  RBC 3.62* 3.85*  HCT 33.2* 36.1*  PLT 469* 465*   No results for input(s): NA, K, CL, CO2, BUN, CREATININE, GLUCOSE, CALCIUM in the last 72 hours. Recent Labs    08/26/17 0316 08/27/17 0623  INR 1.33 1.52    Neurologically intact Neurovascular intact Sensation intact distally Intact pulses distally Dorsiflexion/Plantar flexion intact Incision: dressing C/D/I No cellulitis present Compartment soft  RLE-knee immobilizer in place.  Compartments and calf soft and NT LLE- compartments and calf are soft and NT    Assessment/Plan: 5 Days Post-Op Procedure(s) (LRB): OPEN REDUCTION INTERNAL FIXATION (ORIF) RIGHT BICONDYLAR TIBIAL PLATEAU, REMOVAL OF EX FIX (Right) Up with therapy  LLE-NWB. ROM at the knee RLE-NWB.  Knee immobilizer at all times. NO ROM Appears to be able to d/c to SNF once INR therapeutic F/U with Dr. Erlinda Hong 2 weeks post-op RLE    Aundra Dubin 08/27/2017, 7:17 AM
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
## 2017-08-28 LAB — PROTIME-INR
INR: 2.27
Prothrombin Time: 24.8 s — ABNORMAL HIGH (ref 11.4–15.2)

## 2017-08-28 LAB — CBC
HEMATOCRIT: 35.7 % — AB (ref 39.0–52.0)
HEMOGLOBIN: 11.3 g/dL — AB (ref 13.0–17.0)
MCH: 28.8 pg (ref 26.0–34.0)
MCHC: 31.7 g/dL (ref 30.0–36.0)
MCV: 90.8 fL (ref 78.0–100.0)
Platelets: 540 10*3/uL — ABNORMAL HIGH (ref 150–400)
RBC: 3.93 MIL/uL — ABNORMAL LOW (ref 4.22–5.81)
RDW: 14.6 % (ref 11.5–15.5)
WBC: 9.3 10*3/uL (ref 4.0–10.5)

## 2017-08-28 LAB — HEPARIN LEVEL (UNFRACTIONATED)
HEPARIN UNFRACTIONATED: 0.83 [IU]/mL — AB (ref 0.30–0.70)
HEPARIN UNFRACTIONATED: 0.78 [IU]/mL — AB (ref 0.30–0.70)
Heparin Unfractionated: 0.71 [IU]/mL — ABNORMAL HIGH (ref 0.30–0.70)

## 2017-08-28 MED ORDER — SULFAMETHOXAZOLE-TRIMETHOPRIM 400-80 MG PO TABS: 1.0000 | ORAL_TABLET | Freq: Two times a day (BID) | ORAL | Status: DC

## 2017-08-28 MED ORDER — WARFARIN SODIUM 5 MG PO TABS
10.0000 mg | ORAL_TABLET | Freq: Once | ORAL | Status: AC
  Administered 2017-08-28: 10 mg via ORAL
  Filled 2017-08-28: qty 2

## 2017-08-28 MED ORDER — AMOXICILLIN-POT CLAVULANATE 875-125 MG PO TABS
1.0000 | ORAL_TABLET | Freq: Two times a day (BID) | ORAL | Status: DC
  Administered 2017-08-28 – 2017-08-31 (×7): 1 via ORAL
  Filled 2017-08-28 (×9): qty 1
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
## 2017-08-29 LAB — CBC
HCT: 34.6 % — ABNORMAL LOW (ref 39.0–52.0)
Hemoglobin: 11.1 g/dL — ABNORMAL LOW (ref 13.0–17.0)
MCH: 28.8 pg (ref 26.0–34.0)
MCHC: 32.1 g/dL (ref 30.0–36.0)
MCV: 89.6 fL (ref 78.0–100.0)
PLATELETS: 511 10*3/uL — AB (ref 150–400)
RBC: 3.86 MIL/uL — AB (ref 4.22–5.81)
RDW: 14.7 % (ref 11.5–15.5)
WBC: 8.1 10*3/uL (ref 4.0–10.5)

## 2017-08-29 LAB — HEPARIN LEVEL (UNFRACTIONATED)
Heparin Unfractionated: 0.48 [IU]/mL (ref 0.30–0.70)
Heparin Unfractionated: 0.44 [IU]/mL (ref 0.30–0.70)

## 2017-08-29 LAB — PROTIME-INR
INR: 2.34
PROTHROMBIN TIME: 25.4 s — AB (ref 11.4–15.2)

## 2017-08-29 MED ORDER — HEPARIN (PORCINE) IN NACL 100-0.45 UNIT/ML-% IJ SOLN
INTRAMUSCULAR | Status: AC
  Filled 2017-08-29: qty 250

## 2017-08-29 MED ORDER — BISACODYL 10 MG RE SUPP
10.0000 mg | Freq: Once | RECTAL | Status: AC
  Administered 2017-08-29: 10 mg via RECTAL
  Filled 2017-08-29: qty 1

## 2017-08-29 MED ORDER — WARFARIN SODIUM 5 MG PO TABS
10.0000 mg | ORAL_TABLET | Freq: Once | ORAL | Status: AC
  Administered 2017-08-29: 10 mg via ORAL
  Filled 2017-08-29: qty 2
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
## 2017-08-29 NOTE — Discharge Instructions (Signed)
Postoperative instructions:  Weightbearing instructions: non weight bearing to bilateral lower extremities  Keep your dressing and/or splint clean and dry at all times.  You can remove your dressing on post-operative day #3 and change with a dry/sterile dressing or Band-Aids as needed thereafter.    Incision instructions:  Do not soak your incision for 3 weeks after surgery.  If the incision gets wet, pat dry and do not scrub the incision.  Pain control:  You have been given a prescription to be taken as directed for post-operative pain control.  In addition, elevate the operative extremity above the heart at all times to prevent swelling and throbbing pain.  Take over-the-counter Colace, 100mg  by mouth twice a day while taking narcotic pain medications to help prevent constipation.  Follow up appointments: 1) 10-14 days for suture removal and wound check. 2) Dr. Erlinda Hong as scheduled.   -------------------------------------------------------------------------------------------------------------  After Surgery Pain Control:  After your surgery, post-surgical discomfort or pain is likely. This discomfort can last several days to a few weeks. At certain times of the day your discomfort may be more intense.  Did you receive a nerve block?  A nerve block can provide pain relief for one hour to two days after your surgery. As long as the nerve block is working, you will experience little or no sensation in the area the surgeon operated on.  As the nerve block wears off, you will begin to experience pain or discomfort. It is very important that you begin taking your prescribed pain medication before the nerve block fully wears off. Treating your pain at the first sign of the block wearing off will ensure your pain is better controlled and more tolerable when full-sensation returns. Do not wait until the pain is intolerable, as the medicine will be less effective. It is better to treat pain in advance than  to try and catch up.  General Anesthesia:  If you did not receive a nerve block during your surgery, you will need to start taking your pain medication shortly after your surgery and should continue to do so as prescribed by your surgeon.  Pain Medication:  Most commonly we prescribe Vicodin and Percocet for post-operative pain. Both of these medications contain a combination of acetaminophen (Tylenol) and a narcotic to help control pain.   It takes between 30 and 45 minutes before pain medication starts to work. It is important to take your medication before your pain level gets too intense.   Nausea is a common side effect of many pain medications. You will want to eat something before taking your pain medicine to help prevent nausea.   If you are taking a prescription pain medication that contains acetaminophen, we recommend that you do not take additional over the counter acetaminophen (Tylenol).  Other pain relieving options:   Using a cold pack to ice the affected area a few times a day (15 to 20 minutes at a time) can help to relieve pain, reduce swelling and bruising.   Elevation of the affected area can also help to reduce pain and swelling.

## 2017-08-29 NOTE — Social Work (Signed)
CSW following, pt able to discharge to [facility] whenever medically ready.  Will be able to facilitate discharge first thing tomorrow morning if appropriate.  Alexander Mt, [place] Work [phone]
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
## 2017-08-30 ENCOUNTER — Telehealth: Payer: Self-pay

## 2017-08-30 LAB — CBC
HEMATOCRIT: 36.8 % — AB (ref 39.0–52.0)
Hemoglobin: 11.6 g/dL — ABNORMAL LOW (ref 13.0–17.0)
MCH: 28.7 pg (ref 26.0–34.0)
MCHC: 31.5 g/dL (ref 30.0–36.0)
MCV: 91.1 fL (ref 78.0–100.0)
Platelets: 483 10*3/uL — ABNORMAL HIGH (ref 150–400)
RBC: 4.04 MIL/uL — ABNORMAL LOW (ref 4.22–5.81)
RDW: 14.7 % (ref 11.5–15.5)
WBC: 7.4 10*3/uL (ref 4.0–10.5)

## 2017-08-30 LAB — URINE CULTURE

## 2017-08-30 LAB — PROTIME-INR
INR: 2.23
Prothrombin Time: 24.5 s — ABNORMAL HIGH (ref 11.4–15.2)

## 2017-08-30 LAB — HEPARIN LEVEL (UNFRACTIONATED): Heparin Unfractionated: 0.59 [IU]/mL (ref 0.30–0.70)

## 2017-08-30 MED ORDER — WARFARIN SODIUM 7.5 MG PO TABS
12.5000 mg | ORAL_TABLET | Freq: Once | ORAL | Status: AC
  Administered 2017-08-30: 12.5 mg via ORAL
  Filled 2017-08-30: qty 1
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
## 2017-08-30 NOTE — Social Work (Signed)
CSW continuing to follow, aware INR still not therapeutic for discharge.  Will inform SNF.  Alexander Mt, [place] Work [phone]

## 2017-08-30 NOTE — Telephone Encounter (Signed)
No additional encounter notes. -[place]
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
## 2017-08-31 DIAGNOSIS — I10 Essential (primary) hypertension: Secondary | ICD-10-CM | POA: Diagnosis not present

## 2017-08-31 DIAGNOSIS — S271XXD Traumatic hemothorax, subsequent encounter: Secondary | ICD-10-CM | POA: Diagnosis not present

## 2017-08-31 DIAGNOSIS — R627 Adult failure to thrive: Secondary | ICD-10-CM | POA: Diagnosis not present

## 2017-08-31 DIAGNOSIS — J309 Allergic rhinitis, unspecified: Secondary | ICD-10-CM | POA: Diagnosis not present

## 2017-08-31 DIAGNOSIS — S32040D Wedge compression fracture of fourth lumbar vertebra, subsequent encounter for fracture with routine healing: Secondary | ICD-10-CM | POA: Diagnosis not present

## 2017-08-31 DIAGNOSIS — Z952 Presence of prosthetic heart valve: Secondary | ICD-10-CM | POA: Diagnosis not present

## 2017-08-31 DIAGNOSIS — S82141A Displaced bicondylar fracture of right tibia, initial encounter for closed fracture: Secondary | ICD-10-CM | POA: Diagnosis not present

## 2017-08-31 DIAGNOSIS — M2550 Pain in unspecified joint: Secondary | ICD-10-CM | POA: Diagnosis not present

## 2017-08-31 DIAGNOSIS — S32020D Wedge compression fracture of second lumbar vertebra, subsequent encounter for fracture with routine healing: Secondary | ICD-10-CM | POA: Diagnosis not present

## 2017-08-31 DIAGNOSIS — K228 Other specified diseases of esophagus: Secondary | ICD-10-CM | POA: Diagnosis not present

## 2017-08-31 DIAGNOSIS — S27321A Contusion of lung, unilateral, initial encounter: Secondary | ICD-10-CM | POA: Diagnosis not present

## 2017-08-31 DIAGNOSIS — K661 Hemoperitoneum: Secondary | ICD-10-CM | POA: Diagnosis not present

## 2017-08-31 DIAGNOSIS — S82202A Unspecified fracture of shaft of left tibia, initial encounter for closed fracture: Secondary | ICD-10-CM | POA: Diagnosis not present

## 2017-08-31 DIAGNOSIS — N390 Urinary tract infection, site not specified: Secondary | ICD-10-CM | POA: Diagnosis not present

## 2017-08-31 DIAGNOSIS — M792 Neuralgia and neuritis, unspecified: Secondary | ICD-10-CM | POA: Diagnosis not present

## 2017-08-31 DIAGNOSIS — S271XXA Traumatic hemothorax, initial encounter: Secondary | ICD-10-CM | POA: Diagnosis not present

## 2017-08-31 DIAGNOSIS — M25562 Pain in left knee: Secondary | ICD-10-CM | POA: Diagnosis not present

## 2017-08-31 DIAGNOSIS — S27321D Contusion of lung, unilateral, subsequent encounter: Secondary | ICD-10-CM | POA: Diagnosis not present

## 2017-08-31 DIAGNOSIS — J942 Hemothorax: Secondary | ICD-10-CM | POA: Diagnosis not present

## 2017-08-31 DIAGNOSIS — F411 Generalized anxiety disorder: Secondary | ICD-10-CM | POA: Diagnosis not present

## 2017-08-31 DIAGNOSIS — K5900 Constipation, unspecified: Secondary | ICD-10-CM | POA: Diagnosis not present

## 2017-08-31 DIAGNOSIS — S82109S Unspecified fracture of upper end of unspecified tibia, sequela: Secondary | ICD-10-CM | POA: Diagnosis not present

## 2017-08-31 DIAGNOSIS — S82201A Unspecified fracture of shaft of right tibia, initial encounter for closed fracture: Secondary | ICD-10-CM | POA: Diagnosis not present

## 2017-08-31 DIAGNOSIS — R262 Difficulty in walking, not elsewhere classified: Secondary | ICD-10-CM | POA: Diagnosis not present

## 2017-08-31 DIAGNOSIS — M549 Dorsalgia, unspecified: Secondary | ICD-10-CM | POA: Diagnosis not present

## 2017-08-31 DIAGNOSIS — S32000S Wedge compression fracture of unspecified lumbar vertebra, sequela: Secondary | ICD-10-CM | POA: Diagnosis not present

## 2017-08-31 DIAGNOSIS — Z743 Need for continuous supervision: Secondary | ICD-10-CM | POA: Diagnosis not present

## 2017-08-31 DIAGNOSIS — Z139 Encounter for screening, unspecified: Secondary | ICD-10-CM | POA: Diagnosis not present

## 2017-08-31 DIAGNOSIS — R279 Unspecified lack of coordination: Secondary | ICD-10-CM | POA: Diagnosis not present

## 2017-08-31 DIAGNOSIS — I517 Cardiomegaly: Secondary | ICD-10-CM | POA: Diagnosis not present

## 2017-08-31 DIAGNOSIS — S82151D Displaced fracture of right tibial tuberosity, subsequent encounter for closed fracture with routine healing: Secondary | ICD-10-CM | POA: Diagnosis not present

## 2017-08-31 DIAGNOSIS — E785 Hyperlipidemia, unspecified: Secondary | ICD-10-CM | POA: Diagnosis not present

## 2017-08-31 DIAGNOSIS — M25561 Pain in right knee: Secondary | ICD-10-CM | POA: Diagnosis not present

## 2017-08-31 DIAGNOSIS — S82142S Displaced bicondylar fracture of left tibia, sequela: Secondary | ICD-10-CM | POA: Diagnosis not present

## 2017-08-31 DIAGNOSIS — Z5181 Encounter for therapeutic drug level monitoring: Secondary | ICD-10-CM | POA: Diagnosis not present

## 2017-08-31 DIAGNOSIS — S82141S Displaced bicondylar fracture of right tibia, sequela: Secondary | ICD-10-CM | POA: Diagnosis not present

## 2017-08-31 DIAGNOSIS — M6281 Muscle weakness (generalized): Secondary | ICD-10-CM | POA: Diagnosis not present

## 2017-08-31 DIAGNOSIS — K219 Gastro-esophageal reflux disease without esophagitis: Secondary | ICD-10-CM | POA: Diagnosis not present

## 2017-08-31 DIAGNOSIS — Z954 Presence of other heart-valve replacement: Secondary | ICD-10-CM | POA: Diagnosis not present

## 2017-08-31 DIAGNOSIS — R609 Edema, unspecified: Secondary | ICD-10-CM | POA: Diagnosis not present

## 2017-08-31 LAB — CBC
HEMATOCRIT: 37.1 % — AB (ref 39.0–52.0)
HEMOGLOBIN: 11.6 g/dL — AB (ref 13.0–17.0)
MCH: 28.6 pg (ref 26.0–34.0)
MCHC: 31.3 g/dL (ref 30.0–36.0)
MCV: 91.6 fL (ref 78.0–100.0)
Platelets: 508 10*3/uL — ABNORMAL HIGH (ref 150–400)
RBC: 4.05 MIL/uL — AB (ref 4.22–5.81)
RDW: 15 % (ref 11.5–15.5)
WBC: 7.9 10*3/uL (ref 4.0–10.5)

## 2017-08-31 LAB — HEPARIN LEVEL (UNFRACTIONATED): Heparin Unfractionated: 0.51 [IU]/mL (ref 0.30–0.70)

## 2017-08-31 LAB — PROTIME-INR
INR: 2.52
Prothrombin Time: 27 s — ABNORMAL HIGH (ref 11.4–15.2)

## 2017-08-31 MED ORDER — METHOCARBAMOL 500 MG PO TABS: 500.0000 mg | ORAL_TABLET | Freq: Three times a day (TID) | ORAL | Status: DC | PRN

## 2017-08-31 MED ORDER — ORAL CARE MOUTH RINSE: 15.0000 mL | Freq: Two times a day (BID) | OROMUCOSAL | 0 refills | Status: DC

## 2017-08-31 MED ORDER — POLYETHYLENE GLYCOL 3350 17 G PO PACK: 17.0000 g | PACK | Freq: Two times a day (BID) | ORAL | 0 refills | Status: DC

## 2017-08-31 MED ORDER — WARFARIN SODIUM 5 MG PO TABS
10.0000 mg | ORAL_TABLET | Freq: Once | ORAL | Status: AC
  Administered 2017-08-31: 5 mg via ORAL
  Filled 2017-08-31: qty 2

## 2017-08-31 MED ORDER — GABAPENTIN 600 MG PO TABS: 300.0000 mg | ORAL_TABLET | Freq: Three times a day (TID) | ORAL | Status: DC

## 2017-08-31 MED ORDER — METOPROLOL SUCCINATE ER 50 MG PO TB24: 150.0000 mg | ORAL_TABLET | Freq: Every day | ORAL | Status: DC

## 2017-08-31 MED ORDER — ALPRAZOLAM 0.5 MG PO TABS: 0.5000 mg | ORAL_TABLET | Freq: Four times a day (QID) | ORAL | 0 refills | Status: DC | PRN

## 2017-08-31 MED ORDER — ERYTHROMYCIN 5 MG/GM OP OINT: TOPICAL_OINTMENT | Freq: Four times a day (QID) | OPHTHALMIC | 0 refills | Status: AC

## 2017-08-31 MED ORDER — OXYCODONE HCL 5 MG PO TABS: 5.0000 mg | ORAL_TABLET | Freq: Four times a day (QID) | ORAL | 0 refills | Status: DC | PRN

## 2017-08-31 MED ORDER — WARFARIN SODIUM 10 MG PO TABS: 10.0000 mg | ORAL_TABLET | Freq: Every day | ORAL | Status: DC

## 2017-08-31 MED ORDER — DOCUSATE SODIUM 100 MG PO CAPS: 100.0000 mg | ORAL_CAPSULE | Freq: Two times a day (BID) | ORAL | 0 refills | Status: DC

## 2017-08-31 MED ORDER — AMOXICILLIN-POT CLAVULANATE 875-125 MG PO TABS: 1.0000 | ORAL_TABLET | Freq: Two times a day (BID) | ORAL | Status: AC

## 2017-08-31 MED ORDER — ONDANSETRON 4 MG PO TBDP: 4.0000 mg | ORAL_TABLET | Freq: Four times a day (QID) | ORAL | 0 refills | Status: DC | PRN

## 2017-08-31 MED ORDER — ACETAMINOPHEN 500 MG PO TABS: 1000.0000 mg | ORAL_TABLET | Freq: Three times a day (TID) | ORAL | 0 refills | Status: DC

## 2017-08-31 MED ORDER — FERROUS GLUCONATE 324 (38 FE) MG PO TABS: 324.0000 mg | ORAL_TABLET | Freq: Two times a day (BID) | ORAL | 3 refills | Status: DC
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
## 2017-08-31 NOTE — Social Work (Signed)
Clinical Social Worker facilitated patient discharge including contacting patient family and facility to confirm patient discharge plans.  Clinical information faxed to facility and family agreeable with plan.  CSW arranged ambulance transport via [facility] to [facility]. RN to call [phone] with report prior to discharge.  Clinical Social Worker will sign off for now as social work intervention is no longer needed. Please consult [place] again if new need arises.  Alexander Mt, [place] Social Worker [phone]

## 2017-08-31 NOTE — Clinical Social Work Placement (Signed)
   CLINICAL SOCIAL WORK PLACEMENT  NOTE  [facility]   Date:  08/31/2017  Patient Details  Name: Corey Mathis MRN: [id number] Date of Birth: May 28, 1950  Clinical Social Work is seeking post-discharge placement for this patient at the [place] level of care (*CSW will initial, date and re-position this form in  chart as items are completed):  Yes   Patient/family provided with [place] Work Department's list of facilities offering this level of care within the geographic area requested by the patient (or if unable, by the patient's family).  Yes   Patient/family informed of their freedom to choose among providers that offer the needed level of care, that participate in Medicare, Medicaid or managed care program needed by the patient, have an available bed and are willing to accept the patient.  Yes   Patient/family informed of 's ownership interest in [facility] and [facility], as well as of the fact that they are under no obligation to receive care at these facilities.  PASRR submitted to EDS on 08/20/17     PASRR number received on 08/20/17     Existing PASRR number confirmed on       FL2 transmitted to all facilities in geographic area requested by pt/family on 08/20/17     FL2 transmitted to all facilities within larger geographic area on       Patient informed that his/her managed care company has contracts with or will negotiate with certain facilities, including the following:        Yes   Patient/family informed of bed offers received.  Patient chooses bed at [facility]     Physician recommends and patient chooses bed at      Patient to be transferred to [facility] on 08/31/17.  Patient to be transferred to facility by [facility]     Patient family notified on 08/31/17 of transfer.  Name of family member notified:  wife, frenchie at bedside     PHYSICIAN       Additional Comment:     _______________________________________________ Alexander Mt, [place] 08/31/2017, 5:39 PM
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
## 2017-09-04 ENCOUNTER — Encounter (HOSPITAL_COMMUNITY): Payer: Self-pay | Admitting: Cardiovascular Disease

## 2017-09-04 DIAGNOSIS — Z952 Presence of prosthetic heart valve: Secondary | ICD-10-CM | POA: Diagnosis not present

## 2017-09-04 DIAGNOSIS — S82151D Displaced fracture of right tibial tuberosity, subsequent encounter for closed fracture with routine healing: Secondary | ICD-10-CM | POA: Diagnosis not present

## 2017-09-04 DIAGNOSIS — Z5181 Encounter for therapeutic drug level monitoring: Secondary | ICD-10-CM | POA: Diagnosis not present

## 2017-09-04 DIAGNOSIS — I10 Essential (primary) hypertension: Secondary | ICD-10-CM | POA: Diagnosis not present

## 2017-09-11 ENCOUNTER — Telehealth (INDEPENDENT_AMBULATORY_CARE_PROVIDER_SITE_OTHER): Payer: Self-pay | Admitting: Orthopaedic Surgery

## 2017-09-11 ENCOUNTER — Telehealth: Payer: Self-pay

## 2017-09-11 ENCOUNTER — Ambulatory Visit (INDEPENDENT_AMBULATORY_CARE_PROVIDER_SITE_OTHER): Payer: Self-pay

## 2017-09-11 ENCOUNTER — Encounter (INDEPENDENT_AMBULATORY_CARE_PROVIDER_SITE_OTHER): Payer: Self-pay | Admitting: Orthopaedic Surgery

## 2017-09-11 ENCOUNTER — Ambulatory Visit (INDEPENDENT_AMBULATORY_CARE_PROVIDER_SITE_OTHER): Payer: No Typology Code available for payment source

## 2017-09-11 ENCOUNTER — Ambulatory Visit (INDEPENDENT_AMBULATORY_CARE_PROVIDER_SITE_OTHER): Payer: Medicare Other | Admitting: Orthopaedic Surgery

## 2017-09-11 DIAGNOSIS — M25561 Pain in right knee: Secondary | ICD-10-CM | POA: Diagnosis not present

## 2017-09-11 DIAGNOSIS — S82141A Displaced bicondylar fracture of right tibia, initial encounter for closed fracture: Secondary | ICD-10-CM

## 2017-09-11 DIAGNOSIS — M25562 Pain in left knee: Secondary | ICD-10-CM

## 2017-09-11 DIAGNOSIS — S27321D Contusion of lung, unilateral, subsequent encounter: Secondary | ICD-10-CM | POA: Diagnosis not present

## 2017-09-11 DIAGNOSIS — S271XXD Traumatic hemothorax, subsequent encounter: Secondary | ICD-10-CM | POA: Diagnosis not present

## 2017-09-11 NOTE — Telephone Encounter (Signed)
Pt is scheduled to see Dr.Cooper on 01/07/18 at 11:00 AM . Per Rosaria Ferries  To notfiy the coumadin clinic

## 2017-09-11 NOTE — Telephone Encounter (Signed)
-----   Message from Lonn Georgia, PA-C sent at 09/11/2017  9:38 AM EDT -----   ----- Message ----- From: Lonn Georgia, PA-C Sent: 08/30/2017   4:12 PM To: Lonn Georgia, PA-C, Glyn Ade  I do not see an appointment for this patient, Dr. Burt Knack is so backed up can you go ahead and make one?  He is still in the hospital, going to [facility] for rehab. Just let his family know about the appointment. Thanks   ----- Message ----- From: Reola Mosher Sent: 08/21/2017  10:16 AM To: Lonn Georgia, PA-C, Glyn Ade  Pt of Dr Burt Knack In [place] 4NP05 under MRN [id number]  Please reschedule 06/19 appt w/ Dr Burt Knack If we follow his coumadin, let them know as well.  Thanks

## 2017-09-11 NOTE — Telephone Encounter (Signed)
Patient's wife [name]) called advised patient would have to go [facility] to be fitted for the brace and will have to pay transportation to get him there. She said the therapist said [facility] will come out to fit the patient for no charge. The number to contact [name]) is [phone]

## 2017-09-11 NOTE — Telephone Encounter (Signed)
That's fine [facility] is fine
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
## 2017-09-11 NOTE — Telephone Encounter (Signed)
See message below °

## 2017-09-11 NOTE — Telephone Encounter (Signed)
Pt is scheduled to see Dr. Burt Knack on 01/07/18 at 11:00 AM. He is aware of appointment.

## 2017-09-12 ENCOUNTER — Other Ambulatory Visit: Payer: Self-pay | Admitting: *Deleted
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
## 2017-09-12 NOTE — Telephone Encounter (Signed)
FAXED SCRIPT TO [facility]

## 2017-09-12 NOTE — Telephone Encounter (Signed)
What brace did you want patient to get from [facility]?

## 2017-09-12 NOTE — Telephone Encounter (Signed)
Bledsoe 0-30

## 2017-09-18 ENCOUNTER — Encounter: Payer: Medicare Other | Admitting: Family Medicine

## 2017-09-24 DIAGNOSIS — S82142S Displaced bicondylar fracture of left tibia, sequela: Secondary | ICD-10-CM | POA: Diagnosis not present

## 2017-09-24 DIAGNOSIS — M25561 Pain in right knee: Secondary | ICD-10-CM | POA: Diagnosis not present

## 2017-09-24 DIAGNOSIS — R609 Edema, unspecified: Secondary | ICD-10-CM | POA: Diagnosis not present

## 2017-09-24 DIAGNOSIS — S82141S Displaced bicondylar fracture of right tibia, sequela: Secondary | ICD-10-CM | POA: Diagnosis not present

## 2017-10-01 ENCOUNTER — Other Ambulatory Visit: Payer: Self-pay | Admitting: *Deleted

## 2017-10-01 NOTE — Patient Outreach (Signed)
[facility]) Care Management  10/01/2017  Corey Mathis 1950-12-15 [id number]   Visited patient at [place].  Patient was sitting outside reading.  Patient stated he was doing well.  Spoke with facility discharge [place], who confirmed patient was making progress with therapy goals. Fredia Beets confirmed patient has an ortho appointment Aug 6 to determine weight bearing status.  No discharge at this time. Will continue to monitor for any [facility] discharge planning needs.  For questions or concerns please contact:  Loida Calamia RN, [place] Acute Care Coordinator [phone]) Business Mobile [phone]) Toll free office

## 2017-10-08 DIAGNOSIS — K5900 Constipation, unspecified: Secondary | ICD-10-CM | POA: Diagnosis not present

## 2017-10-08 DIAGNOSIS — I10 Essential (primary) hypertension: Secondary | ICD-10-CM | POA: Diagnosis not present

## 2017-10-08 DIAGNOSIS — K219 Gastro-esophageal reflux disease without esophagitis: Secondary | ICD-10-CM | POA: Diagnosis not present

## 2017-10-09 ENCOUNTER — Ambulatory Visit (INDEPENDENT_AMBULATORY_CARE_PROVIDER_SITE_OTHER): Payer: No Typology Code available for payment source

## 2017-10-09 ENCOUNTER — Ambulatory Visit (INDEPENDENT_AMBULATORY_CARE_PROVIDER_SITE_OTHER): Payer: Self-pay

## 2017-10-09 ENCOUNTER — Other Ambulatory Visit: Payer: Self-pay | Admitting: *Deleted

## 2017-10-09 ENCOUNTER — Encounter (INDEPENDENT_AMBULATORY_CARE_PROVIDER_SITE_OTHER): Payer: Self-pay | Admitting: Physician Assistant

## 2017-10-09 ENCOUNTER — Ambulatory Visit (INDEPENDENT_AMBULATORY_CARE_PROVIDER_SITE_OTHER): Payer: Medicare Other | Admitting: Physician Assistant

## 2017-10-09 DIAGNOSIS — S82141A Displaced bicondylar fracture of right tibia, initial encounter for closed fracture: Secondary | ICD-10-CM

## 2017-10-09 DIAGNOSIS — M25562 Pain in left knee: Secondary | ICD-10-CM | POA: Insufficient documentation

## 2017-10-09 NOTE — Patient Outreach (Signed)
[facility]) Care Management  10/09/2017  KOY LAMP [birth year] [id number]   Met with Elmer Ramp, dcp at facility.  They report patient will discharge home with son on 8/10. Patient has done well with therapy and is motivated. They have set up home care and DME, they did request hospital bed as patient is still 50% WBS  And bedroom is upstairs.  No [facility] CM needs assessed at this time, patient has [facility] program information  Will collaborate with [facility] UM. Royetta Crochet. Laymond Purser, RN, BSN, [place] [phone]) Business Cell  [phone]) Toll Free Office
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
## 2017-10-10 DIAGNOSIS — M549 Dorsalgia, unspecified: Secondary | ICD-10-CM | POA: Diagnosis not present

## 2017-10-10 DIAGNOSIS — S32020D Wedge compression fracture of second lumbar vertebra, subsequent encounter for fracture with routine healing: Secondary | ICD-10-CM | POA: Diagnosis not present

## 2017-10-10 DIAGNOSIS — S32040D Wedge compression fracture of fourth lumbar vertebra, subsequent encounter for fracture with routine healing: Secondary | ICD-10-CM | POA: Diagnosis not present

## 2017-10-11 DIAGNOSIS — Z954 Presence of other heart-valve replacement: Secondary | ICD-10-CM | POA: Diagnosis not present

## 2017-10-11 DIAGNOSIS — M6281 Muscle weakness (generalized): Secondary | ICD-10-CM | POA: Diagnosis not present

## 2017-10-11 DIAGNOSIS — S82142S Displaced bicondylar fracture of left tibia, sequela: Secondary | ICD-10-CM | POA: Diagnosis not present

## 2017-10-11 DIAGNOSIS — F411 Generalized anxiety disorder: Secondary | ICD-10-CM | POA: Diagnosis not present

## 2017-10-11 DIAGNOSIS — R627 Adult failure to thrive: Secondary | ICD-10-CM | POA: Diagnosis not present

## 2017-10-11 DIAGNOSIS — S32000S Wedge compression fracture of unspecified lumbar vertebra, sequela: Secondary | ICD-10-CM | POA: Diagnosis not present

## 2017-10-14 DIAGNOSIS — F419 Anxiety disorder, unspecified: Secondary | ICD-10-CM | POA: Diagnosis not present

## 2017-10-14 DIAGNOSIS — Z9181 History of falling: Secondary | ICD-10-CM | POA: Diagnosis not present

## 2017-10-14 DIAGNOSIS — Z7901 Long term (current) use of anticoagulants: Secondary | ICD-10-CM | POA: Diagnosis not present

## 2017-10-14 DIAGNOSIS — Z87440 Personal history of urinary (tract) infections: Secondary | ICD-10-CM | POA: Diagnosis not present

## 2017-10-14 DIAGNOSIS — Z7951 Long term (current) use of inhaled steroids: Secondary | ICD-10-CM | POA: Diagnosis not present

## 2017-10-14 DIAGNOSIS — S82141D Displaced bicondylar fracture of right tibia, subsequent encounter for closed fracture with routine healing: Secondary | ICD-10-CM | POA: Diagnosis not present

## 2017-10-14 DIAGNOSIS — Z952 Presence of prosthetic heart valve: Secondary | ICD-10-CM | POA: Diagnosis not present

## 2017-10-14 DIAGNOSIS — Z7982 Long term (current) use of aspirin: Secondary | ICD-10-CM | POA: Diagnosis not present

## 2017-10-14 DIAGNOSIS — S82142D Displaced bicondylar fracture of left tibia, subsequent encounter for closed fracture with routine healing: Secondary | ICD-10-CM | POA: Diagnosis not present

## 2017-10-14 DIAGNOSIS — S32029D Unspecified fracture of second lumbar vertebra, subsequent encounter for fracture with routine healing: Secondary | ICD-10-CM | POA: Diagnosis not present

## 2017-10-14 DIAGNOSIS — S32049D Unspecified fracture of fourth lumbar vertebra, subsequent encounter for fracture with routine healing: Secondary | ICD-10-CM | POA: Diagnosis not present

## 2017-10-14 DIAGNOSIS — I119 Hypertensive heart disease without heart failure: Secondary | ICD-10-CM | POA: Diagnosis not present

## 2017-10-15 ENCOUNTER — Ambulatory Visit (INDEPENDENT_AMBULATORY_CARE_PROVIDER_SITE_OTHER): Payer: Medicare Other | Admitting: *Deleted

## 2017-10-15 ENCOUNTER — Telehealth (INDEPENDENT_AMBULATORY_CARE_PROVIDER_SITE_OTHER): Payer: Self-pay | Admitting: Orthopaedic Surgery

## 2017-10-15 DIAGNOSIS — Z9889 Other specified postprocedural states: Secondary | ICD-10-CM | POA: Diagnosis not present

## 2017-10-15 DIAGNOSIS — Z7901 Long term (current) use of anticoagulants: Secondary | ICD-10-CM

## 2017-10-15 DIAGNOSIS — I059 Rheumatic mitral valve disease, unspecified: Secondary | ICD-10-CM | POA: Diagnosis not present

## 2017-10-15 LAB — POCT INR: INR: 1.9 — AB (ref 2.0–3.0)

## 2017-10-15 NOTE — Telephone Encounter (Signed)
IC verbal given.  

## 2017-10-15 NOTE — Telephone Encounter (Signed)
Is this okay?

## 2017-10-15 NOTE — Telephone Encounter (Signed)
Corey Mathis  [facility] to leave VM   Verbal Orders  Two times a week for 3 weeks effective 10/14/17

## 2017-10-15 NOTE — Telephone Encounter (Signed)
Newell Coral from [place] called left voicemail message stating she did eval on 10/14/17 and need verbal orders for HHPT starting 10/14/17 for 2 wk 3   The number to contact Dan Europe is [phone]

## 2017-10-15 NOTE — Patient Instructions (Signed)
Description   Continue taking Coumadin 1 tablet (10mg ) daily . Recheck in 9 days. Coumadin Clinic [phone]

## 2017-10-15 NOTE — Telephone Encounter (Signed)
yes

## 2017-10-16 ENCOUNTER — Telehealth: Payer: Self-pay | Admitting: Cardiology

## 2017-10-16 DIAGNOSIS — S82141D Displaced bicondylar fracture of right tibia, subsequent encounter for closed fracture with routine healing: Secondary | ICD-10-CM | POA: Diagnosis not present

## 2017-10-16 DIAGNOSIS — I119 Hypertensive heart disease without heart failure: Secondary | ICD-10-CM | POA: Diagnosis not present

## 2017-10-16 DIAGNOSIS — S32029D Unspecified fracture of second lumbar vertebra, subsequent encounter for fracture with routine healing: Secondary | ICD-10-CM | POA: Diagnosis not present

## 2017-10-16 DIAGNOSIS — S82142D Displaced bicondylar fracture of left tibia, subsequent encounter for closed fracture with routine healing: Secondary | ICD-10-CM | POA: Diagnosis not present

## 2017-10-16 DIAGNOSIS — S32049D Unspecified fracture of fourth lumbar vertebra, subsequent encounter for fracture with routine healing: Secondary | ICD-10-CM | POA: Diagnosis not present

## 2017-10-16 DIAGNOSIS — F419 Anxiety disorder, unspecified: Secondary | ICD-10-CM | POA: Diagnosis not present

## 2017-10-16 NOTE — Telephone Encounter (Signed)
Will forward to Dr [name] for review and if is willing to order home visits and medical social worker consult .Adonis Housekeeper

## 2017-10-16 NOTE — Telephone Encounter (Signed)
New Message:     Corey Mathis is calling to get orders for disease mng and inr checks. The frequency will be 2x times a week for 2 weeks, 1x time a week for 2 wks, and 3 prn visits. She also states for a medical social work consult as well.

## 2017-10-16 NOTE — Telephone Encounter (Signed)
We can order INR checks; other issues will need to come from surgery or primary care Kirk Ruths

## 2017-10-16 NOTE — Telephone Encounter (Signed)
Spoke with Corey Mathis, Aware of dr Jacalyn Lefevre recommendations. She reports the medical doctor nor the surgeon will sign orders. She will have the nurse call me to discuss what the patient needs.

## 2017-10-17 ENCOUNTER — Other Ambulatory Visit: Payer: Self-pay | Admitting: *Deleted
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
## 2017-10-18 ENCOUNTER — Telehealth (INDEPENDENT_AMBULATORY_CARE_PROVIDER_SITE_OTHER): Payer: Self-pay | Admitting: Orthopaedic Surgery

## 2017-10-18 ENCOUNTER — Encounter: Payer: Self-pay | Admitting: *Deleted

## 2017-10-18 ENCOUNTER — Other Ambulatory Visit: Payer: Self-pay

## 2017-10-18 ENCOUNTER — Emergency Department (INDEPENDENT_AMBULATORY_CARE_PROVIDER_SITE_OTHER)
Admission: EM | Admit: 2017-10-18 | Discharge: 2017-10-18 | Disposition: A | Discharge status: Home / Self Care | Payer: Medicare Other | Source: Home / Self Care | Attending: Family Medicine | Admitting: Family Medicine

## 2017-10-18 DIAGNOSIS — F419 Anxiety disorder, unspecified: Secondary | ICD-10-CM | POA: Diagnosis not present

## 2017-10-18 DIAGNOSIS — S81812A Laceration without foreign body, left lower leg, initial encounter: Secondary | ICD-10-CM

## 2017-10-18 DIAGNOSIS — S82141D Displaced bicondylar fracture of right tibia, subsequent encounter for closed fracture with routine healing: Secondary | ICD-10-CM | POA: Diagnosis not present

## 2017-10-18 DIAGNOSIS — S8012XA Contusion of left lower leg, initial encounter: Secondary | ICD-10-CM

## 2017-10-18 DIAGNOSIS — I119 Hypertensive heart disease without heart failure: Secondary | ICD-10-CM | POA: Diagnosis not present

## 2017-10-18 DIAGNOSIS — S82142D Displaced bicondylar fracture of left tibia, subsequent encounter for closed fracture with routine healing: Secondary | ICD-10-CM | POA: Diagnosis not present

## 2017-10-18 DIAGNOSIS — S32049D Unspecified fracture of fourth lumbar vertebra, subsequent encounter for fracture with routine healing: Secondary | ICD-10-CM | POA: Diagnosis not present

## 2017-10-18 DIAGNOSIS — S32029D Unspecified fracture of second lumbar vertebra, subsequent encounter for fracture with routine healing: Secondary | ICD-10-CM | POA: Diagnosis not present

## 2017-10-18 MED ORDER — CEPHALEXIN 500 MG PO CAPS: 500.0000 mg | ORAL_CAPSULE | Freq: Two times a day (BID) | ORAL | 0 refills | Status: AC
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
## 2017-10-18 NOTE — Discharge Instructions (Signed)
°  Try to keep the wound clean with warm water and mild soap. You may take the prescribed antibiotic- Keflex (cephalexin) to help prevent infection. Please follow up next Tuesday with your orthopedist as previously scheduled.  Please follow up with family medicine or return to urgent care sooner if symptoms significantly worsening- worsening pain, redness, swelling, fever.

## 2017-10-18 NOTE — Telephone Encounter (Signed)
Corey Mathis at [facility] is requesting Home health skilled nursing visits 2 x for 2 weeks 1 x for 2 weeks 3 PRN visits  Medical Social Worker Eval OT eval  CB # [phone]

## 2017-10-18 NOTE — Telephone Encounter (Signed)
Ok on orders.  

## 2017-10-18 NOTE — Telephone Encounter (Signed)
yes

## 2017-10-18 NOTE — ED Triage Notes (Signed)
Patient received abrasion/laceration to LLE about 3 days ago.

## 2017-10-18 NOTE — Telephone Encounter (Signed)
Tried calling Nicolette, no answer. Could not leave voicemail, mailbox is full.

## 2017-10-19 DIAGNOSIS — S82142D Displaced bicondylar fracture of left tibia, subsequent encounter for closed fracture with routine healing: Secondary | ICD-10-CM | POA: Diagnosis not present

## 2017-10-19 DIAGNOSIS — S82141D Displaced bicondylar fracture of right tibia, subsequent encounter for closed fracture with routine healing: Secondary | ICD-10-CM | POA: Diagnosis not present

## 2017-10-19 DIAGNOSIS — S32029D Unspecified fracture of second lumbar vertebra, subsequent encounter for fracture with routine healing: Secondary | ICD-10-CM | POA: Diagnosis not present

## 2017-10-19 DIAGNOSIS — S32049D Unspecified fracture of fourth lumbar vertebra, subsequent encounter for fracture with routine healing: Secondary | ICD-10-CM | POA: Diagnosis not present

## 2017-10-19 DIAGNOSIS — F419 Anxiety disorder, unspecified: Secondary | ICD-10-CM | POA: Diagnosis not present

## 2017-10-19 DIAGNOSIS — I119 Hypertensive heart disease without heart failure: Secondary | ICD-10-CM | POA: Diagnosis not present

## 2017-10-19 NOTE — Telephone Encounter (Signed)
Called to advise.  

## 2017-10-20 NOTE — Telephone Encounter (Signed)
Left VM enquiring about status; encouraged to follow with Dr.Metheney, but if need contact over remainder of weekend please call us.

## 2017-10-22 DIAGNOSIS — S32029D Unspecified fracture of second lumbar vertebra, subsequent encounter for fracture with routine healing: Secondary | ICD-10-CM | POA: Diagnosis not present

## 2017-10-22 DIAGNOSIS — S32049D Unspecified fracture of fourth lumbar vertebra, subsequent encounter for fracture with routine healing: Secondary | ICD-10-CM | POA: Diagnosis not present

## 2017-10-22 DIAGNOSIS — S82141D Displaced bicondylar fracture of right tibia, subsequent encounter for closed fracture with routine healing: Secondary | ICD-10-CM | POA: Diagnosis not present

## 2017-10-22 DIAGNOSIS — S82142D Displaced bicondylar fracture of left tibia, subsequent encounter for closed fracture with routine healing: Secondary | ICD-10-CM | POA: Diagnosis not present

## 2017-10-22 DIAGNOSIS — I119 Hypertensive heart disease without heart failure: Secondary | ICD-10-CM | POA: Diagnosis not present

## 2017-10-22 DIAGNOSIS — F419 Anxiety disorder, unspecified: Secondary | ICD-10-CM | POA: Diagnosis not present

## 2017-10-23 ENCOUNTER — Ambulatory Visit (INDEPENDENT_AMBULATORY_CARE_PROVIDER_SITE_OTHER): Payer: Medicare Other

## 2017-10-23 ENCOUNTER — Ambulatory Visit (INDEPENDENT_AMBULATORY_CARE_PROVIDER_SITE_OTHER): Payer: Self-pay

## 2017-10-23 ENCOUNTER — Encounter (INDEPENDENT_AMBULATORY_CARE_PROVIDER_SITE_OTHER): Payer: Self-pay | Admitting: Orthopaedic Surgery

## 2017-10-23 ENCOUNTER — Ambulatory Visit (INDEPENDENT_AMBULATORY_CARE_PROVIDER_SITE_OTHER): Payer: Medicare Other | Admitting: Orthopaedic Surgery

## 2017-10-23 DIAGNOSIS — M25561 Pain in right knee: Secondary | ICD-10-CM | POA: Diagnosis not present

## 2017-10-23 DIAGNOSIS — S82142D Displaced bicondylar fracture of left tibia, subsequent encounter for closed fracture with routine healing: Secondary | ICD-10-CM

## 2017-10-23 DIAGNOSIS — S32029D Unspecified fracture of second lumbar vertebra, subsequent encounter for fracture with routine healing: Secondary | ICD-10-CM | POA: Diagnosis not present

## 2017-10-23 DIAGNOSIS — S82141A Displaced bicondylar fracture of right tibia, initial encounter for closed fracture: Secondary | ICD-10-CM

## 2017-10-23 DIAGNOSIS — S32049D Unspecified fracture of fourth lumbar vertebra, subsequent encounter for fracture with routine healing: Secondary | ICD-10-CM | POA: Diagnosis not present

## 2017-10-23 DIAGNOSIS — I119 Hypertensive heart disease without heart failure: Secondary | ICD-10-CM | POA: Diagnosis not present

## 2017-10-23 DIAGNOSIS — F419 Anxiety disorder, unspecified: Secondary | ICD-10-CM | POA: Diagnosis not present

## 2017-10-23 DIAGNOSIS — S82141D Displaced bicondylar fracture of right tibia, subsequent encounter for closed fracture with routine healing: Secondary | ICD-10-CM | POA: Diagnosis not present

## 2017-10-23 MED ORDER — PREDNISONE 10 MG (21) PO TBPK: ORAL_TABLET | ORAL | 0 refills | Status: DC
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
## 2017-10-24 ENCOUNTER — Ambulatory Visit (INDEPENDENT_AMBULATORY_CARE_PROVIDER_SITE_OTHER): Payer: Medicare Other

## 2017-10-24 DIAGNOSIS — I059 Rheumatic mitral valve disease, unspecified: Secondary | ICD-10-CM

## 2017-10-24 DIAGNOSIS — Z9889 Other specified postprocedural states: Secondary | ICD-10-CM | POA: Diagnosis not present

## 2017-10-24 DIAGNOSIS — Z7901 Long term (current) use of anticoagulants: Secondary | ICD-10-CM

## 2017-10-24 LAB — POCT INR: INR: 5.6 — AB (ref 2.0–3.0)

## 2017-10-24 NOTE — Patient Instructions (Signed)
Description   Skip today and tomorrow's dosage of Coumadin, then take 1/2 tablet on Friday, then resume same dosage 1 tablet (10mg ) daily . Recheck in 1 week. [facility] [phone]

## 2017-10-25 DIAGNOSIS — S32029D Unspecified fracture of second lumbar vertebra, subsequent encounter for fracture with routine healing: Secondary | ICD-10-CM | POA: Diagnosis not present

## 2017-10-25 DIAGNOSIS — S32049D Unspecified fracture of fourth lumbar vertebra, subsequent encounter for fracture with routine healing: Secondary | ICD-10-CM | POA: Diagnosis not present

## 2017-10-25 DIAGNOSIS — F419 Anxiety disorder, unspecified: Secondary | ICD-10-CM | POA: Diagnosis not present

## 2017-10-25 DIAGNOSIS — S82141D Displaced bicondylar fracture of right tibia, subsequent encounter for closed fracture with routine healing: Secondary | ICD-10-CM | POA: Diagnosis not present

## 2017-10-25 DIAGNOSIS — S82142D Displaced bicondylar fracture of left tibia, subsequent encounter for closed fracture with routine healing: Secondary | ICD-10-CM | POA: Diagnosis not present

## 2017-10-25 DIAGNOSIS — I119 Hypertensive heart disease without heart failure: Secondary | ICD-10-CM | POA: Diagnosis not present

## 2017-10-28 ENCOUNTER — Encounter: Payer: Self-pay | Admitting: Emergency Medicine

## 2017-10-28 ENCOUNTER — Other Ambulatory Visit: Payer: Self-pay

## 2017-10-28 ENCOUNTER — Emergency Department (INDEPENDENT_AMBULATORY_CARE_PROVIDER_SITE_OTHER)
Admission: EM | Admit: 2017-10-28 | Discharge: 2017-10-28 | Disposition: A | Discharge status: Home / Self Care | Payer: Medicare Other | Source: Home / Self Care | Attending: Family Medicine | Admitting: Family Medicine

## 2017-10-28 DIAGNOSIS — R030 Elevated blood-pressure reading, without diagnosis of hypertension: Secondary | ICD-10-CM | POA: Diagnosis not present

## 2017-10-28 NOTE — ED Triage Notes (Signed)
Pt c/o of hypertension and general weakness x1 day. States he just "doesn't feel right"
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
## 2017-10-28 NOTE — Discharge Instructions (Signed)
°  Be sure to continue taking your blood pressure medication as prescribed. Try to get at least 8 hours of sleep at night. Stay well hydrated throughout the day.  Try to keep your pain under control and limit to no caffeine intake to help keep your blood pressure under control.  Please follow up with Dr. Madilyn Fireman later this week as previously scheduled. If you blood pressure becomes high again and remains high or you develop symptoms- severe headache, change in vision, trouble speaking, new numbness or weakness please call 911 or have someone drive you to the hospital immediately.

## 2017-10-28 NOTE — ED Notes (Signed)
Recheck BP Right arm- 146/78  Using pt's machine- Left arm 146/96  Instructed to bring machine to appointment on Thursday and check BP only twice per day between now and then and record.

## 2017-10-29 DIAGNOSIS — S82142D Displaced bicondylar fracture of left tibia, subsequent encounter for closed fracture with routine healing: Secondary | ICD-10-CM | POA: Diagnosis not present

## 2017-10-29 DIAGNOSIS — F419 Anxiety disorder, unspecified: Secondary | ICD-10-CM | POA: Diagnosis not present

## 2017-10-29 DIAGNOSIS — I119 Hypertensive heart disease without heart failure: Secondary | ICD-10-CM | POA: Diagnosis not present

## 2017-10-29 DIAGNOSIS — S32029D Unspecified fracture of second lumbar vertebra, subsequent encounter for fracture with routine healing: Secondary | ICD-10-CM | POA: Diagnosis not present

## 2017-10-29 DIAGNOSIS — S82141D Displaced bicondylar fracture of right tibia, subsequent encounter for closed fracture with routine healing: Secondary | ICD-10-CM | POA: Diagnosis not present

## 2017-10-29 DIAGNOSIS — S32049D Unspecified fracture of fourth lumbar vertebra, subsequent encounter for fracture with routine healing: Secondary | ICD-10-CM | POA: Diagnosis not present

## 2017-10-30 DIAGNOSIS — S32029D Unspecified fracture of second lumbar vertebra, subsequent encounter for fracture with routine healing: Secondary | ICD-10-CM | POA: Diagnosis not present

## 2017-10-30 DIAGNOSIS — S32049D Unspecified fracture of fourth lumbar vertebra, subsequent encounter for fracture with routine healing: Secondary | ICD-10-CM | POA: Diagnosis not present

## 2017-10-30 DIAGNOSIS — I119 Hypertensive heart disease without heart failure: Secondary | ICD-10-CM | POA: Diagnosis not present

## 2017-10-30 DIAGNOSIS — S82142D Displaced bicondylar fracture of left tibia, subsequent encounter for closed fracture with routine healing: Secondary | ICD-10-CM | POA: Diagnosis not present

## 2017-10-30 DIAGNOSIS — S82141D Displaced bicondylar fracture of right tibia, subsequent encounter for closed fracture with routine healing: Secondary | ICD-10-CM | POA: Diagnosis not present

## 2017-10-30 DIAGNOSIS — F419 Anxiety disorder, unspecified: Secondary | ICD-10-CM | POA: Diagnosis not present

## 2017-10-31 ENCOUNTER — Ambulatory Visit (INDEPENDENT_AMBULATORY_CARE_PROVIDER_SITE_OTHER): Payer: Medicare Other | Admitting: *Deleted

## 2017-10-31 DIAGNOSIS — I059 Rheumatic mitral valve disease, unspecified: Secondary | ICD-10-CM

## 2017-10-31 DIAGNOSIS — Z9889 Other specified postprocedural states: Secondary | ICD-10-CM

## 2017-10-31 DIAGNOSIS — Z7901 Long term (current) use of anticoagulants: Secondary | ICD-10-CM

## 2017-10-31 LAB — POCT INR: INR: 2.4 (ref 2.0–3.0)

## 2017-10-31 NOTE — Patient Instructions (Signed)
Description   Today take 1.5 tablets, then continue taking your  same dosage 1 tablet (10mg ) daily.  Recheck in 9 days.  [facility] [phone]

## 2017-11-01 ENCOUNTER — Telehealth (INDEPENDENT_AMBULATORY_CARE_PROVIDER_SITE_OTHER): Payer: Self-pay | Admitting: Orthopaedic Surgery

## 2017-11-01 ENCOUNTER — Encounter: Payer: Self-pay | Admitting: Family Medicine

## 2017-11-01 ENCOUNTER — Other Ambulatory Visit: Payer: Self-pay | Admitting: Family Medicine

## 2017-11-01 ENCOUNTER — Ambulatory Visit (INDEPENDENT_AMBULATORY_CARE_PROVIDER_SITE_OTHER): Payer: Medicare Other | Admitting: Family Medicine

## 2017-11-01 VITALS — BP 120/68 | HR 81 | Ht 76.0 in | Wt 176.0 lb

## 2017-11-01 DIAGNOSIS — I10 Essential (primary) hypertension: Secondary | ICD-10-CM | POA: Diagnosis not present

## 2017-11-01 DIAGNOSIS — R7301 Impaired fasting glucose: Secondary | ICD-10-CM

## 2017-11-01 DIAGNOSIS — F419 Anxiety disorder, unspecified: Secondary | ICD-10-CM | POA: Diagnosis not present

## 2017-11-01 DIAGNOSIS — Z1211 Encounter for screening for malignant neoplasm of colon: Secondary | ICD-10-CM | POA: Diagnosis not present

## 2017-11-01 DIAGNOSIS — S82141D Displaced bicondylar fracture of right tibia, subsequent encounter for closed fracture with routine healing: Secondary | ICD-10-CM | POA: Diagnosis not present

## 2017-11-01 DIAGNOSIS — H539 Unspecified visual disturbance: Secondary | ICD-10-CM

## 2017-11-01 DIAGNOSIS — Z0000 Encounter for general adult medical examination without abnormal findings: Secondary | ICD-10-CM | POA: Diagnosis not present

## 2017-11-01 DIAGNOSIS — S82142D Displaced bicondylar fracture of left tibia, subsequent encounter for closed fracture with routine healing: Secondary | ICD-10-CM | POA: Diagnosis not present

## 2017-11-01 DIAGNOSIS — S32049D Unspecified fracture of fourth lumbar vertebra, subsequent encounter for fracture with routine healing: Secondary | ICD-10-CM | POA: Diagnosis not present

## 2017-11-01 DIAGNOSIS — S32029D Unspecified fracture of second lumbar vertebra, subsequent encounter for fracture with routine healing: Secondary | ICD-10-CM | POA: Diagnosis not present

## 2017-11-01 DIAGNOSIS — Z125 Encounter for screening for malignant neoplasm of prostate: Secondary | ICD-10-CM | POA: Diagnosis not present

## 2017-11-01 DIAGNOSIS — I119 Hypertensive heart disease without heart failure: Secondary | ICD-10-CM | POA: Diagnosis not present

## 2017-11-01 MED ORDER — PAROXETINE HCL 20 MG PO TABS: ORAL_TABLET | ORAL | 1 refills | Status: DC

## 2017-11-01 NOTE — Telephone Encounter (Signed)
Please advise on ALL parts of message.  

## 2017-11-01 NOTE — Telephone Encounter (Signed)
Lysbeth Galas at [facility] is needing to know if patient still needs to be wearing knee immobilizer at all times or if he can take it off.   Her CB # [phone], can leave a message on vm - secure line.

## 2017-11-01 NOTE — Telephone Encounter (Signed)
1.  Yes 2.  Yes 3.  ROM as tolerated at this time

## 2017-11-01 NOTE — Telephone Encounter (Signed)
Tillie Rung, [place] called needing a few things:  1. Updated HHPT verbal orders need to see patient for balance 2 x for 4 weeks 1 x for 2 weeks  2. Also if Dr. Erlinda Hong will approve for patient to wear an OTC knee brace with lateral stage due to the one he has now is poorly fitting, too large and slides down. She states this is affecting his knee flexion.  3. Does patient have any ROM restrictions for his right knee? Dynamic knee brace is currently locked to 30 degrees flexion.  Please call Tillie Rung to advise her of above questions # [phone]

## 2017-11-01 NOTE — Patient Instructions (Addendum)
Ok to start your hydrochlorothiazide. Decrease BuSpar down to once a day for 5 days and then stop the medication completely. Okay to go ahead and start the paroxetine.  This will be for anxiety and nerves.  We will probably just keep you on it for a few months.  Take 1 a day every day in the morning. Ok to use the methocarbamol ( muscle relaxer ) you were given.  All muscle relaxers can cause some sedation so just be careful.  But since you are not driving it should not be a big issue. Can try melatonin at bedtime.  Start with around 3 mg and can work up to 8 mg if needed.  You can also take 25 mg of Benadryl at bedtime if needed.

## 2017-11-01 NOTE — Progress Notes (Signed)
Subjective:   Corey Mathis is a 67 y.o. male who presents for Medicare Annual/Subsequent preventive examination.  Review of Systems:  Conference of review of systems is negative except for HPI.       Objective:    Vitals: BP 120/68   Pulse 81   Ht 6\' 4"  (1.93 m)   Wt 176 lb (79.8 kg)   SpO2 100%   BMI 21.42 kg/m   Body mass index is 21.42 kg/m.  Advanced Directives 11/01/2017 10/17/2017 08/29/2017 07/12/2017 05/26/2014 10/10/2012  Does Patient Have a Medical Advance Directive? Yes No No No No Patient does not have advance directive  Type of Scientist, forensic Power of [place];Living will - - - - -  Copy of [place] in Chart? No - copy requested - - - - -  Would patient like information on creating a medical advance directive? - No - Patient declined No - Patient declined - Yes - Educational materials given -  Pre-existing out of facility DNR order (yellow form or pink MOST form) - - - - - No    Tobacco Social History   Tobacco Use  Smoking Status Never Smoker  Smokeless Tobacco Never Used     Counseling given: Not Answered   Clinical Intake:       Physical Exam  Constitutional: He is oriented to person, place, and time. He appears well-developed and well-nourished.  HENT:  Head: Normocephalic and atraumatic.  Cardiovascular: Normal rate, regular rhythm and normal heart sounds.  Pulmonary/Chest: Effort normal and breath sounds normal.  Neurological: He is alert and oriented to person, place, and time.  Skin: Skin is warm and dry.  Psychiatric: He has a normal mood and affect. His behavior is normal.                   Past Medical History:  Diagnosis Date  . Allergy   . Allergy history unknown   . Anxiety   . Asthma   . Asthma   . CHF (congestive heart failure) ([place])   . Dyslipidemia   . History of mitral valve replacement   . HTN (hypertension)   . Hypertension   . Labyrinthitis    hx  . Mitral valve replaced     hx   Past Surgical History:  Procedure Laterality Date  . ACNE CYST REMOVAL     from hand and back  . EXTERNAL FIXATION LEG Right 08/11/2017   Procedure: EXTERNAL FIXATION, RIGHT KNEE;  Surgeon: Leandrew Koyanagi, MD;  Location: [place];  Service: Orthopedics;  Laterality: Right;  . FINE NEEDLE ASPIRATION Right 08/11/2017   Procedure: FINE NEEDLE ASPIRATION  of right Knee with 80 cc of dark red fluid removed;  Surgeon: Leandrew Koyanagi, MD;  Location: [place];  Service: Orthopedics;  Laterality: Right;  . Jaw surgery (other)    . NASAL SINUS SURGERY N/A 10/16/2012   Procedure: NASAL ENDOSCOPIC POLYPECTOMY/MAXILLARY ANTROSTOMY/ETHMOIDECTOMY;  Surgeon: Izora Gala, MD;  Location: [place];  Service: ENT;  Laterality: N/A;  . ORIF TIBIA PLATEAU Left 08/13/2017   Procedure: OPEN REDUCTION INTERNAL FIXATION (ORIF) TIBIAL PLATEAU;  Surgeon: Leandrew Koyanagi, MD;  Location: [place];  Service: Orthopedics;  Laterality: Left;  . ORIF TIBIA PLATEAU Right 08/22/2017   Procedure: OPEN REDUCTION INTERNAL FIXATION (ORIF) RIGHT BICONDYLAR TIBIAL PLATEAU, REMOVAL OF EX FIX;  Surgeon: Leandrew Koyanagi, MD;  Location: [place];  Service: Orthopedics;  Laterality: Right;  . RIGHT/LEFT HEART CATH AND  CORONARY ANGIOGRAPHY N/A 07/12/2017   Procedure: RIGHT/LEFT HEART CATH AND CORONARY ANGIOGRAPHY;  Surgeon: Burnell Blanks, MD;  Location: [facility];  Service: Cardiovascular;  Laterality: N/A;  . TOOTH EXTRACTION    . VALVE REPLACEMENT  1998   St. Jude, mitral   Family History  Problem Relation Age of Onset  . Heart attack Father 37  . Colon cancer Mother 70  . Hypertension Mother    Social History   Socioeconomic History  . Marital status: Married    Spouse name: Frenchie   . Number of children: 2  . Years of education: Not on file  . Highest education level: Not on file  Occupational History  . Occupation: Engineer, agricultural    Employer: [employer]  Social Needs  . Financial resource strain: Not on file  . Food  insecurity:    Worry: Not on file    Inability: Not on file  . Transportation needs:    Medical: Not on file    Non-medical: Not on file  Tobacco Use  . Smoking status: Never Smoker  . Smokeless tobacco: Never Used  Substance and Sexual Activity  . Alcohol use: Never    Frequency: Never  . Drug use: Never  . Sexual activity: Not Currently  Lifestyle  . Physical activity:    Days per week: Not on file    Minutes per session: Not on file  . Stress: Not on file  Relationships  . Social connections:    Talks on phone: Not on file    Gets together: Not on file    Attends religious service: Not on file    Active member of club or organization: Not on file    Attends meetings of clubs or organizations: Not on file    Relationship status: Not on file  Other Topics Concern  . Not on file  Social History Narrative   ** Merged History Encounter **       Some exercise, bike and walking.  2 cups per day.     Outpatient Encounter Medications as of 11/01/2017  Medication Sig  . acetaminophen (TYLENOL) 500 MG tablet Take 2 tablets (1,000 mg total) by mouth every 8 (eight) hours.  Marland Kitchen albuterol (PROAIR HFA) 108 (90 Base) MCG/ACT inhaler Inhale 2 puffs into the lungs every 6 (six) hours as needed for wheezing.  Marland Kitchen ALPRAZolam (XANAX) 0.5 MG tablet TAKE 1 TABLET BY MOUTH ONCE DAILY AS NEEDED FOR ANXIETY  . aspirin 81 MG EC tablet Take 81 mg by mouth daily.   . cholecalciferol (VITAMIN D) 1000 units tablet Take 1,000 Units by mouth daily.  Marland Kitchen co-enzyme Q-10 30 MG capsule Take 30 mg by mouth daily.  Marland Kitchen docusate sodium (COLACE) 100 MG capsule Take 1 capsule (100 mg total) by mouth 2 (two) times daily.  . Flax Oil-Fish Oil-Borage Oil (FISH OIL-FLAX OIL-BORAGE OIL) CAPS Take 1 capsule by mouth daily.  . fluticasone (FLONASE) 50 MCG/ACT nasal spray Place 1 spray into both nostrils daily.  . methocarbamol (ROBAXIN) 500 MG tablet Take 1 tablet (500 mg total) by mouth every 8 (eight) hours as needed for  muscle spasms.  . metoprolol succinate (TOPROL-XL) 50 MG 24 hr tablet Take 150 mg by mouth daily.   Marland Kitchen mouth rinse LIQD solution 15 mLs by Mouth Rinse route 2 (two) times daily.  . Multiple Vitamin (MULTIVITAMIN) tablet Take 1 tablet by mouth daily.  . polyethylene glycol (MIRALAX / GLYCOLAX) packet Take 17 g by mouth  2 (two) times daily.  . SYMBICORT 160-4.5 MCG/ACT inhaler Inhale 1 puff into the lungs 2 (two) times daily.  Marland Kitchen warfarin (COUMADIN) 10 MG tablet Take 1 tablet (10 mg total) by mouth daily. Check INR periodically and adjust dose as needed to maintain INR 2.5-3.5  . hydrochlorothiazide (HYDRODIURIL) 25 MG tablet TAKE ONE TABLET BY MOUTH ONCE DAILY (Patient not taking: Reported on 11/01/2017)  . PARoxetine (PAXIL) 20 MG tablet Take 0.5 tablets (10 mg total) by mouth daily for 8 days, THEN 1 tablet (20 mg total) daily for 22 days.  . [DISCONTINUED] acetaminophen (TYLENOL) 500 MG tablet Take 1,000 mg by mouth every 6 (six) hours as needed for moderate pain or headache.  . [DISCONTINUED] albuterol (PROVENTIL HFA;VENTOLIN HFA) 108 (90 Base) MCG/ACT inhaler Inhale 1 puff into the lungs every 6 (six) hours as needed for wheezing or shortness of breath.  . [DISCONTINUED] ALPRAZolam (XANAX) 0.5 MG tablet Take 1 tablet (0.5 mg total) by mouth every 6 (six) hours as needed for anxiety.  . [DISCONTINUED] aspirin EC 81 MG tablet Take 81 mg by mouth daily.  . [DISCONTINUED] budesonide-formoterol (SYMBICORT) 160-4.5 MCG/ACT inhaler Inhale 1 puff into the lungs 2 (two) times daily.  . [DISCONTINUED] busPIRone (BUSPAR) 5 MG tablet Take 5 mg by mouth 2 (two) times daily.  . [DISCONTINUED] Cholecalciferol (VITAMIN D3) 5000 units CAPS Take 5,000 Units by mouth daily.  . [DISCONTINUED] Coenzyme Q10 (COQ10) 200 MG CAPS Take 200 mg by mouth daily.   . [DISCONTINUED] enoxaparin (LOVENOX) 100 MG/ML injection Inject 1 mL (100 mg total) into the skin every 12 (twelve) hours.  . [DISCONTINUED] ferrous gluconate  (FERGON) 324 MG tablet Take 1 tablet (324 mg total) by mouth 2 (two) times daily with a meal.  . [DISCONTINUED] ferrous sulfate 325 (65 FE) MG tablet Take 325 mg by mouth daily with breakfast.  . [DISCONTINUED] fluticasone (FLONASE) 50 MCG/ACT nasal spray Place 1 spray into both nostrils as needed for allergies or rhinitis.  . [DISCONTINUED] gabapentin (NEURONTIN) 600 MG tablet Take 0.5 tablets (300 mg total) by mouth 3 (three) times daily.  . [DISCONTINUED] losartan (COZAAR) 100 MG tablet Take 1 tablet (100 mg total) by mouth daily.  . [DISCONTINUED] metoprolol succinate (TOPROL-XL) 50 MG 24 hr tablet Take 3 tablets (150 mg total) by mouth daily. Take with or immediately following a meal.  . [DISCONTINUED] Multiple Vitamin (MULTIVITAMIN WITH MINERALS) TABS tablet Take 1 tablet by mouth daily.  . [DISCONTINUED] Omega-3 Fatty Acids (FISH OIL) 1000 MG CAPS Take 1,000 mg by mouth daily.  . [DISCONTINUED] ondansetron (ZOFRAN-ODT) 4 MG disintegrating tablet Take 1 tablet (4 mg total) by mouth every 6 (six) hours as needed for nausea.  . [DISCONTINUED] oxyCODONE (OXY IR/ROXICODONE) 5 MG immediate release tablet Take 1-2 tablets (5-10 mg total) by mouth every 6 (six) hours as needed (5 mg for moderate, 10 mg for severe).  . [DISCONTINUED] predniSONE (STERAPRED UNI-PAK 21 TAB) 10 MG (21) TBPK tablet Take as directed  . [DISCONTINUED] warfarin (COUMADIN) 10 MG tablet TAKE 1 TABLET BY MOUTH ONCE DAILY OR  AS  DIRECTED  BY  COUMADIN  CLINIC (Patient taking differently: Take 10 mg by mouth daily. )   No facility-administered encounter medications on file as of 11/01/2017.     Activities of Daily Living In your present state of health, do you have any difficulty performing the following activities: 11/01/2017 08/15/2017  Hearing? N N  Vision? N N  Difficulty concentrating or making decisions? N N  Walking or climbing stairs? Y N  Comment right leg fracture -  Dressing or bathing? - N  Doing errands,  shopping? N Y  Some recent data might be hidden    Patient Care Team: [name], MD as PCP - General (Family Medicine) [name] Denice Bors, MD as PCP - Cardiology (Cardiology) [name], MD (Family Medicine)   Assessment:   This is a routine wellness examination for [place].  Exercise Activities and Dietary recommendations Current Exercise Habits: The patient does not participate in regular exercise at present, Exercise limited by: orthopedic condition(s)  Goals   None     Fall Risk Fall Risk  06/15/2017 09/05/2016 11/01/2015 05/26/2014 09/23/2013  Falls in the past year? No No No No No  Comment - - Emmi Telephone Survey: data to providers prior to load - -     Depression Screen PHQ 2/9 Scores 10/17/2017 06/15/2017 09/05/2016 05/26/2014  PHQ - 2 Score 0 2 0 1  PHQ- 9 Score - 9 - -    Cognitive Function     6CIT Screen 11/01/2017 09/05/2016  What Year? 0 points 0 points  What month? 0 points 0 points  What time? 0 points 0 points  Count back from 20 0 points 0 points  Months in reverse 0 points 0 points  Repeat phrase 0 points 2 points  Total Score 0 2    Immunization History  Administered Date(s) Administered  . Influenza Split 02/10/2011  . Influenza, High Dose Seasonal PF 11/04/2016  . Influenza,inj,Quad PF,6+ Mos 01/13/2013, 01/26/2014, 01/12/2015  . Pneumococcal Conjugate-13 09/23/2013  . Pneumococcal Polysaccharide-23 03/13/2011  . Pneumococcal-Unspecified 05/10/2016  . Tdap 03/07/1999, 08/10/2015    Qualifies for Shingles Vaccine? Yes  Screening Tests Health Maintenance  Topic Date Due  . INFLUENZA VACCINE  01/16/2018 (Originally 10/04/2017)  . COLONOSCOPY  02/23/2021  . TETANUS/TDAP  08/09/2025  . Hepatitis C Screening  Completed  . PNA vac Low Risk Adult  Completed   Cancer Screenings: Lung: Low Dose CT Chest recommended if Age 66-80 years, 30 pack-year currently smoking OR have quit w/in 15years. Patient does not qualify. Colorectal:  overdue for 5 year recall.  Will call [facility] specialist.   Additional Screenings: Hepatitis C Screening:      Plan:    Medicare Wellness Exam  I have personally reviewed and noted the following in the patient's chart:   . Medical and social history - mom with colon cacner . Use of alcohol, tobacco or illicit drugs  . Current medications and supplements - updated list . Functional ability and status . Nutritional status . Physical activity . Advanced directives . List of other physicians - up dated.   Marland Kitchen Hospitalizations, surgeries, and ER visits in previous 12 months . Vitals . Screenings to include cognitive, depression, and falls . Referrals and appointments  In addition, I have reviewed and discussed with patient certain preventive protocols, quality metrics, and best practice recommendations. A written personalized care plan for preventive services as well as general preventive health recommendations were provided to patient.     Beatrice Lecher, MD  11/01/2017

## 2017-11-02 DIAGNOSIS — F419 Anxiety disorder, unspecified: Secondary | ICD-10-CM | POA: Diagnosis not present

## 2017-11-02 DIAGNOSIS — S82142D Displaced bicondylar fracture of left tibia, subsequent encounter for closed fracture with routine healing: Secondary | ICD-10-CM | POA: Diagnosis not present

## 2017-11-02 DIAGNOSIS — S32029D Unspecified fracture of second lumbar vertebra, subsequent encounter for fracture with routine healing: Secondary | ICD-10-CM | POA: Diagnosis not present

## 2017-11-02 DIAGNOSIS — I119 Hypertensive heart disease without heart failure: Secondary | ICD-10-CM | POA: Diagnosis not present

## 2017-11-02 DIAGNOSIS — S82141D Displaced bicondylar fracture of right tibia, subsequent encounter for closed fracture with routine healing: Secondary | ICD-10-CM | POA: Diagnosis not present

## 2017-11-02 DIAGNOSIS — S32049D Unspecified fracture of fourth lumbar vertebra, subsequent encounter for fracture with routine healing: Secondary | ICD-10-CM | POA: Diagnosis not present

## 2017-11-02 LAB — CBC
HCT: 46.2 % (ref 38.5–50.0)
Hemoglobin: 15.5 g/dL (ref 13.2–17.1)
MCH: 29.6 pg (ref 27.0–33.0)
MCHC: 33.5 g/dL (ref 32.0–36.0)
MCV: 88.2 fL (ref 80.0–100.0)
MPV: 10.8 fL (ref 7.5–12.5)
Platelets: 279 10*3/uL (ref 140–400)
RBC: 5.24 10*6/uL (ref 4.20–5.80)
RDW: 13.4 % (ref 11.0–15.0)
WBC: 3.4 10*3/uL — ABNORMAL LOW (ref 3.8–10.8)

## 2017-11-02 LAB — COMPLETE METABOLIC PANEL WITH GFR
AG Ratio: 1.3 (calc) (ref 1.0–2.5)
ALT: 11 U/L (ref 9–46)
AST: 23 U/L (ref 10–35)
Albumin: 4.4 g/dL (ref 3.6–5.1)
Alkaline phosphatase (APISO): 114 U/L (ref 40–115)
BUN: 10 mg/dL (ref 7–25)
CO2: 29 mmol/L (ref 20–32)
Calcium: 10.2 mg/dL (ref 8.6–10.3)
Chloride: 100 mmol/L (ref 98–110)
Creat: 0.8 mg/dL (ref 0.70–1.25)
GFR, Est African American: 106 mL/min/{1.73_m2} (ref >=60)
GFR, Est Non African American: 91 mL/min/{1.73_m2} (ref >=60)
Globulin: 3.5 g/dL (calc) (ref 1.9–3.7)
Glucose, Bld: 104 mg/dL — ABNORMAL HIGH (ref 65–99)
Potassium: 4.9 mmol/L (ref 3.5–5.3)
Sodium: 136 mmol/L (ref 135–146)
Total Bilirubin: 0.7 mg/dL (ref 0.2–1.2)
Total Protein: 7.9 g/dL (ref 6.1–8.1)

## 2017-11-02 LAB — HEMOGLOBIN A1C
EAG (MMOL/L): 5.8 (calc)
Hgb A1c MFr Bld: 5.3 % of total Hgb (ref <5.7)
Mean Plasma Glucose: 105 (calc)

## 2017-11-02 LAB — PSA: PSA: 1 ng/mL (ref <=4.0)

## 2017-11-02 LAB — LIPID PANEL
CHOLESTEROL: 206 mg/dL — AB (ref <200)
HDL: 41 mg/dL (ref >40)
LDL CHOLESTEROL (CALC): 138 mg/dL — AB
Non-HDL Cholesterol (Calc): 165 mg/dL (calc) — ABNORMAL HIGH (ref <130)
Total CHOL/HDL Ratio: 5 (calc) — ABNORMAL HIGH (ref <5.0)
Triglycerides: 147 mg/dL (ref <150)

## 2017-11-02 NOTE — Telephone Encounter (Signed)
Called Tillie Rung to advise on all parts of message

## 2017-11-05 DIAGNOSIS — S82141D Displaced bicondylar fracture of right tibia, subsequent encounter for closed fracture with routine healing: Secondary | ICD-10-CM | POA: Diagnosis not present

## 2017-11-05 DIAGNOSIS — I119 Hypertensive heart disease without heart failure: Secondary | ICD-10-CM | POA: Diagnosis not present

## 2017-11-05 DIAGNOSIS — F419 Anxiety disorder, unspecified: Secondary | ICD-10-CM | POA: Diagnosis not present

## 2017-11-05 DIAGNOSIS — S32049D Unspecified fracture of fourth lumbar vertebra, subsequent encounter for fracture with routine healing: Secondary | ICD-10-CM | POA: Diagnosis not present

## 2017-11-05 DIAGNOSIS — S32029D Unspecified fracture of second lumbar vertebra, subsequent encounter for fracture with routine healing: Secondary | ICD-10-CM | POA: Diagnosis not present

## 2017-11-05 DIAGNOSIS — S82142D Displaced bicondylar fracture of left tibia, subsequent encounter for closed fracture with routine healing: Secondary | ICD-10-CM | POA: Diagnosis not present

## 2017-11-06 ENCOUNTER — Telehealth (INDEPENDENT_AMBULATORY_CARE_PROVIDER_SITE_OTHER): Payer: Self-pay | Admitting: Orthopaedic Surgery

## 2017-11-06 DIAGNOSIS — I119 Hypertensive heart disease without heart failure: Secondary | ICD-10-CM | POA: Diagnosis not present

## 2017-11-06 DIAGNOSIS — S32029D Unspecified fracture of second lumbar vertebra, subsequent encounter for fracture with routine healing: Secondary | ICD-10-CM | POA: Diagnosis not present

## 2017-11-06 DIAGNOSIS — F419 Anxiety disorder, unspecified: Secondary | ICD-10-CM | POA: Diagnosis not present

## 2017-11-06 DIAGNOSIS — S32049D Unspecified fracture of fourth lumbar vertebra, subsequent encounter for fracture with routine healing: Secondary | ICD-10-CM | POA: Diagnosis not present

## 2017-11-06 DIAGNOSIS — S82142D Displaced bicondylar fracture of left tibia, subsequent encounter for closed fracture with routine healing: Secondary | ICD-10-CM | POA: Diagnosis not present

## 2017-11-06 DIAGNOSIS — S82141D Displaced bicondylar fracture of right tibia, subsequent encounter for closed fracture with routine healing: Secondary | ICD-10-CM | POA: Diagnosis not present

## 2017-11-06 NOTE — Telephone Encounter (Signed)
May remove it

## 2017-11-06 NOTE — Telephone Encounter (Signed)
Called [facility] with details.

## 2017-11-06 NOTE — Telephone Encounter (Signed)
Lysbeth Galas (OT) from [facility] called asked if patient need to wear the knee brace all the time or can he remove it. She advised the knee brace is to large,slides down and it's rubbing the leg. The number to contact Lysbeth Galas is [phone]

## 2017-11-06 NOTE — Telephone Encounter (Signed)
Please advise 

## 2017-11-08 DIAGNOSIS — F419 Anxiety disorder, unspecified: Secondary | ICD-10-CM | POA: Diagnosis not present

## 2017-11-08 DIAGNOSIS — S82141D Displaced bicondylar fracture of right tibia, subsequent encounter for closed fracture with routine healing: Secondary | ICD-10-CM | POA: Diagnosis not present

## 2017-11-08 DIAGNOSIS — S32029D Unspecified fracture of second lumbar vertebra, subsequent encounter for fracture with routine healing: Secondary | ICD-10-CM | POA: Diagnosis not present

## 2017-11-08 DIAGNOSIS — S82142D Displaced bicondylar fracture of left tibia, subsequent encounter for closed fracture with routine healing: Secondary | ICD-10-CM | POA: Diagnosis not present

## 2017-11-08 DIAGNOSIS — I119 Hypertensive heart disease without heart failure: Secondary | ICD-10-CM | POA: Diagnosis not present

## 2017-11-08 DIAGNOSIS — S32049D Unspecified fracture of fourth lumbar vertebra, subsequent encounter for fracture with routine healing: Secondary | ICD-10-CM | POA: Diagnosis not present

## 2017-11-09 ENCOUNTER — Ambulatory Visit (INDEPENDENT_AMBULATORY_CARE_PROVIDER_SITE_OTHER): Payer: Medicare Other | Admitting: *Deleted

## 2017-11-09 ENCOUNTER — Telehealth (INDEPENDENT_AMBULATORY_CARE_PROVIDER_SITE_OTHER): Payer: Self-pay | Admitting: Orthopaedic Surgery

## 2017-11-09 DIAGNOSIS — S82142D Displaced bicondylar fracture of left tibia, subsequent encounter for closed fracture with routine healing: Secondary | ICD-10-CM | POA: Diagnosis not present

## 2017-11-09 DIAGNOSIS — Z9889 Other specified postprocedural states: Secondary | ICD-10-CM

## 2017-11-09 DIAGNOSIS — I059 Rheumatic mitral valve disease, unspecified: Secondary | ICD-10-CM | POA: Diagnosis not present

## 2017-11-09 DIAGNOSIS — S32029D Unspecified fracture of second lumbar vertebra, subsequent encounter for fracture with routine healing: Secondary | ICD-10-CM | POA: Diagnosis not present

## 2017-11-09 DIAGNOSIS — F419 Anxiety disorder, unspecified: Secondary | ICD-10-CM | POA: Diagnosis not present

## 2017-11-09 DIAGNOSIS — S82141D Displaced bicondylar fracture of right tibia, subsequent encounter for closed fracture with routine healing: Secondary | ICD-10-CM | POA: Diagnosis not present

## 2017-11-09 DIAGNOSIS — Z7901 Long term (current) use of anticoagulants: Secondary | ICD-10-CM

## 2017-11-09 DIAGNOSIS — S32049D Unspecified fracture of fourth lumbar vertebra, subsequent encounter for fracture with routine healing: Secondary | ICD-10-CM | POA: Diagnosis not present

## 2017-11-09 DIAGNOSIS — I119 Hypertensive heart disease without heart failure: Secondary | ICD-10-CM | POA: Diagnosis not present

## 2017-11-09 LAB — POCT INR: INR: 3.9 — AB (ref 2.0–3.0)

## 2017-11-09 NOTE — Telephone Encounter (Signed)
syes

## 2017-11-09 NOTE — Telephone Encounter (Signed)
Is this ok?

## 2017-11-09 NOTE — Telephone Encounter (Signed)
[facility] Nurse  [phone]   Ok to leave VM  Verbal Orders  One week three

## 2017-11-09 NOTE — Patient Instructions (Signed)
Description   Hold today's dose then continue taking your same dosage 1 tablet (10mg ) daily.  Recheck in 1 week. [facility] [phone]

## 2017-11-12 NOTE — Telephone Encounter (Signed)
Called left voicemail on her phone

## 2017-11-13 DIAGNOSIS — S82141D Displaced bicondylar fracture of right tibia, subsequent encounter for closed fracture with routine healing: Secondary | ICD-10-CM | POA: Diagnosis not present

## 2017-11-13 DIAGNOSIS — I119 Hypertensive heart disease without heart failure: Secondary | ICD-10-CM | POA: Diagnosis not present

## 2017-11-13 DIAGNOSIS — F419 Anxiety disorder, unspecified: Secondary | ICD-10-CM | POA: Diagnosis not present

## 2017-11-13 DIAGNOSIS — S82142D Displaced bicondylar fracture of left tibia, subsequent encounter for closed fracture with routine healing: Secondary | ICD-10-CM | POA: Diagnosis not present

## 2017-11-13 DIAGNOSIS — S32029D Unspecified fracture of second lumbar vertebra, subsequent encounter for fracture with routine healing: Secondary | ICD-10-CM | POA: Diagnosis not present

## 2017-11-13 DIAGNOSIS — S32049D Unspecified fracture of fourth lumbar vertebra, subsequent encounter for fracture with routine healing: Secondary | ICD-10-CM | POA: Diagnosis not present

## 2017-11-14 ENCOUNTER — Other Ambulatory Visit: Payer: Self-pay | Admitting: Cardiology

## 2017-11-14 DIAGNOSIS — I119 Hypertensive heart disease without heart failure: Secondary | ICD-10-CM | POA: Diagnosis not present

## 2017-11-14 DIAGNOSIS — S32049D Unspecified fracture of fourth lumbar vertebra, subsequent encounter for fracture with routine healing: Secondary | ICD-10-CM | POA: Diagnosis not present

## 2017-11-14 DIAGNOSIS — I428 Other cardiomyopathies: Secondary | ICD-10-CM

## 2017-11-14 DIAGNOSIS — S82142D Displaced bicondylar fracture of left tibia, subsequent encounter for closed fracture with routine healing: Secondary | ICD-10-CM | POA: Diagnosis not present

## 2017-11-14 DIAGNOSIS — S82141D Displaced bicondylar fracture of right tibia, subsequent encounter for closed fracture with routine healing: Secondary | ICD-10-CM | POA: Diagnosis not present

## 2017-11-14 DIAGNOSIS — S32029D Unspecified fracture of second lumbar vertebra, subsequent encounter for fracture with routine healing: Secondary | ICD-10-CM | POA: Diagnosis not present

## 2017-11-14 DIAGNOSIS — F419 Anxiety disorder, unspecified: Secondary | ICD-10-CM | POA: Diagnosis not present

## 2017-11-14 NOTE — Telephone Encounter (Signed)
Rx request sent to pharmacy.  

## 2017-11-16 ENCOUNTER — Ambulatory Visit (INDEPENDENT_AMBULATORY_CARE_PROVIDER_SITE_OTHER): Payer: Medicare Other | Admitting: *Deleted

## 2017-11-16 DIAGNOSIS — S82141D Displaced bicondylar fracture of right tibia, subsequent encounter for closed fracture with routine healing: Secondary | ICD-10-CM | POA: Diagnosis not present

## 2017-11-16 DIAGNOSIS — Z9889 Other specified postprocedural states: Secondary | ICD-10-CM

## 2017-11-16 DIAGNOSIS — I119 Hypertensive heart disease without heart failure: Secondary | ICD-10-CM | POA: Diagnosis not present

## 2017-11-16 DIAGNOSIS — I059 Rheumatic mitral valve disease, unspecified: Secondary | ICD-10-CM | POA: Diagnosis not present

## 2017-11-16 DIAGNOSIS — S32049D Unspecified fracture of fourth lumbar vertebra, subsequent encounter for fracture with routine healing: Secondary | ICD-10-CM | POA: Diagnosis not present

## 2017-11-16 DIAGNOSIS — S82142D Displaced bicondylar fracture of left tibia, subsequent encounter for closed fracture with routine healing: Secondary | ICD-10-CM | POA: Diagnosis not present

## 2017-11-16 DIAGNOSIS — S32029D Unspecified fracture of second lumbar vertebra, subsequent encounter for fracture with routine healing: Secondary | ICD-10-CM | POA: Diagnosis not present

## 2017-11-16 DIAGNOSIS — F419 Anxiety disorder, unspecified: Secondary | ICD-10-CM | POA: Diagnosis not present

## 2017-11-16 DIAGNOSIS — Z7901 Long term (current) use of anticoagulants: Secondary | ICD-10-CM | POA: Diagnosis not present

## 2017-11-16 LAB — POCT INR: INR: 2.4 (ref 2.0–3.0)

## 2017-11-16 NOTE — Patient Instructions (Signed)
Description   Today take 1.5 tablets then continue taking your same dosage 1 tablet (10mg ) daily.  Recheck in 12 days. [facility] Clinic [phone]

## 2017-11-19 DIAGNOSIS — S32029D Unspecified fracture of second lumbar vertebra, subsequent encounter for fracture with routine healing: Secondary | ICD-10-CM | POA: Diagnosis not present

## 2017-11-19 DIAGNOSIS — F419 Anxiety disorder, unspecified: Secondary | ICD-10-CM | POA: Diagnosis not present

## 2017-11-19 DIAGNOSIS — S82142D Displaced bicondylar fracture of left tibia, subsequent encounter for closed fracture with routine healing: Secondary | ICD-10-CM | POA: Diagnosis not present

## 2017-11-19 DIAGNOSIS — S32049D Unspecified fracture of fourth lumbar vertebra, subsequent encounter for fracture with routine healing: Secondary | ICD-10-CM | POA: Diagnosis not present

## 2017-11-19 DIAGNOSIS — I119 Hypertensive heart disease without heart failure: Secondary | ICD-10-CM | POA: Diagnosis not present

## 2017-11-19 DIAGNOSIS — S82141D Displaced bicondylar fracture of right tibia, subsequent encounter for closed fracture with routine healing: Secondary | ICD-10-CM | POA: Diagnosis not present

## 2017-11-21 DIAGNOSIS — S82141D Displaced bicondylar fracture of right tibia, subsequent encounter for closed fracture with routine healing: Secondary | ICD-10-CM | POA: Diagnosis not present

## 2017-11-21 DIAGNOSIS — S32049D Unspecified fracture of fourth lumbar vertebra, subsequent encounter for fracture with routine healing: Secondary | ICD-10-CM | POA: Diagnosis not present

## 2017-11-21 DIAGNOSIS — I119 Hypertensive heart disease without heart failure: Secondary | ICD-10-CM | POA: Diagnosis not present

## 2017-11-21 DIAGNOSIS — F419 Anxiety disorder, unspecified: Secondary | ICD-10-CM | POA: Diagnosis not present

## 2017-11-21 DIAGNOSIS — S32029D Unspecified fracture of second lumbar vertebra, subsequent encounter for fracture with routine healing: Secondary | ICD-10-CM | POA: Diagnosis not present

## 2017-11-21 DIAGNOSIS — S82142D Displaced bicondylar fracture of left tibia, subsequent encounter for closed fracture with routine healing: Secondary | ICD-10-CM | POA: Diagnosis not present

## 2017-11-22 ENCOUNTER — Encounter (INDEPENDENT_AMBULATORY_CARE_PROVIDER_SITE_OTHER): Payer: Self-pay | Admitting: Orthopaedic Surgery

## 2017-11-22 ENCOUNTER — Ambulatory Visit (INDEPENDENT_AMBULATORY_CARE_PROVIDER_SITE_OTHER): Payer: Medicare Other

## 2017-11-22 ENCOUNTER — Ambulatory Visit (INDEPENDENT_AMBULATORY_CARE_PROVIDER_SITE_OTHER): Payer: Medicare Other | Admitting: Orthopaedic Surgery

## 2017-11-22 ENCOUNTER — Ambulatory Visit (INDEPENDENT_AMBULATORY_CARE_PROVIDER_SITE_OTHER): Payer: Self-pay

## 2017-11-22 DIAGNOSIS — M25562 Pain in left knee: Secondary | ICD-10-CM | POA: Diagnosis not present

## 2017-11-22 DIAGNOSIS — G8929 Other chronic pain: Secondary | ICD-10-CM

## 2017-11-22 DIAGNOSIS — M25561 Pain in right knee: Secondary | ICD-10-CM

## 2017-11-22 DIAGNOSIS — S82141A Displaced bicondylar fracture of right tibia, initial encounter for closed fracture: Secondary | ICD-10-CM

## 2017-11-22 DIAGNOSIS — S82142D Displaced bicondylar fracture of left tibia, subsequent encounter for closed fracture with routine healing: Secondary | ICD-10-CM
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
## 2017-11-26 DIAGNOSIS — Z23 Encounter for immunization: Secondary | ICD-10-CM | POA: Diagnosis not present

## 2017-11-28 ENCOUNTER — Encounter (INDEPENDENT_AMBULATORY_CARE_PROVIDER_SITE_OTHER): Payer: Self-pay

## 2017-11-28 ENCOUNTER — Ambulatory Visit (INDEPENDENT_AMBULATORY_CARE_PROVIDER_SITE_OTHER): Payer: Medicare Other | Admitting: *Deleted

## 2017-11-28 DIAGNOSIS — I059 Rheumatic mitral valve disease, unspecified: Secondary | ICD-10-CM | POA: Diagnosis not present

## 2017-11-28 DIAGNOSIS — Z9889 Other specified postprocedural states: Secondary | ICD-10-CM

## 2017-11-28 DIAGNOSIS — Z7901 Long term (current) use of anticoagulants: Secondary | ICD-10-CM

## 2017-11-28 LAB — POCT INR: INR: 1.7 — AB (ref 2.0–3.0)

## 2017-11-28 MED ORDER — WARFARIN SODIUM 10 MG PO TABS: 10.0000 mg | ORAL_TABLET | Freq: Every day | ORAL | 1 refills | Status: DC

## 2017-11-28 NOTE — Patient Instructions (Signed)
Description   Today take 1.5 tablets then change your dose to 1 tablet daily except 1.5 tablets on Wednesdays.   Recheck in 9 days. Coumadin Clinic [phone]

## 2017-11-29 ENCOUNTER — Encounter: Payer: Self-pay | Admitting: Family Medicine

## 2017-11-29 ENCOUNTER — Ambulatory Visit (INDEPENDENT_AMBULATORY_CARE_PROVIDER_SITE_OTHER): Payer: Medicare Other | Admitting: Family Medicine

## 2017-11-29 VITALS — BP 126/68 | HR 65 | Ht 76.0 in | Wt 177.0 lb

## 2017-11-29 DIAGNOSIS — I10 Essential (primary) hypertension: Secondary | ICD-10-CM

## 2017-11-29 DIAGNOSIS — K5903 Drug induced constipation: Secondary | ICD-10-CM | POA: Diagnosis not present

## 2017-11-29 DIAGNOSIS — F419 Anxiety disorder, unspecified: Secondary | ICD-10-CM

## 2017-11-29 DIAGNOSIS — F5101 Primary insomnia: Secondary | ICD-10-CM | POA: Diagnosis not present

## 2017-11-29 MED ORDER — PAROXETINE HCL 20 MG PO TABS: 20.0000 mg | ORAL_TABLET | Freq: Every day | ORAL | 1 refills | Status: DC

## 2017-11-29 NOTE — Progress Notes (Signed)
Subjective:    Patient ID: Corey Mathis, male    DOB: 14-Mar-1950, 67 y.o.   MRN: [id number]  HPI Hypertension- Pt denies chest pain, SOB, dizziness, or heart palpitations.  Taking meds as directed w/o problems.  Denies medication side effects.  Restarted his hydrochlorothiazide about 4 weeks ago.  He has been eating a little bit more salt this week with ham biscuits.  Reports his home blood pressures have been systolic 1 34-2 20.  Anxiety-we decided to wean the BuSpar and start Paxil he is completely off his BuSpar now.  Really feels like the Paxil is working well thus far.  He is happy with the results that he seen.  Insomnia -he still struggling with sleep still only getting about 4 hours each night.  He really has not tried the melatonin yet.  He does feel like that he is getting more restful sleep that he was previously.  He is actually starting outpatient rehab tomorrow for his right knee.  He still has some limited range of motion and its difficult to get up he still has some significant swelling in that right knee.  His pain is tolerable so he is not really using any pain medications.  He also wanted to discuss constipation.  He still struggling with some very hard stools.  He was previous [place] taking Colace 3 times a day.  He really wants to be able get down to once a day and wants to know if any of his medications could be contributing to this.  Prune juice does seem to help in fact he has a cup of prune juice he will usually have a bowel movement within 6 to 8 hours.  Review of Systems   BP (!) 150/83   Pulse 65   Ht 6\' 4"  (1.93 m)   Wt 177 lb (80.3 kg)   SpO2 100%   BMI 21.55 kg/m     Allergies  Allergen Reactions  . Lisinopril Anaphylaxis and Other (See Comments)    angioedema  . Lisinopril Shortness Of Breath  . Carvedilol Other (See Comments)    wheezing  . Codeine Sulfate Nausea Only  . Ezetimibe-Simvastatin Other (See Comments)    Serious vertigo  . Propranolol  Hcl Other (See Comments)    wheezing  . Ramipril Other (See Comments)    wheezing  . Codeine Nausea Only    Past Medical History:  Diagnosis Date  . Allergy   . Allergy history unknown   . Anxiety   . Asthma   . Asthma   . CHF (congestive heart failure) ([place])   . Dyslipidemia   . History of mitral valve replacement   . HTN (hypertension)   . Hypertension   . Labyrinthitis    hx  . Mitral valve replaced    hx    Past Surgical History:  Procedure Laterality Date  . ACNE CYST REMOVAL     from hand and back  . EXTERNAL FIXATION LEG Right 08/11/2017   Procedure: EXTERNAL FIXATION, RIGHT KNEE;  Surgeon: Leandrew Koyanagi, MD;  Location: [place];  Service: Orthopedics;  Laterality: Right;  . FINE NEEDLE ASPIRATION Right 08/11/2017   Procedure: FINE NEEDLE ASPIRATION  of right Knee with 80 cc of dark red fluid removed;  Surgeon: Leandrew Koyanagi, MD;  Location: [place];  Service: Orthopedics;  Laterality: Right;  . Jaw surgery (other)    . NASAL SINUS SURGERY N/A 10/16/2012   Procedure: NASAL ENDOSCOPIC POLYPECTOMY/MAXILLARY ANTROSTOMY/ETHMOIDECTOMY;  Surgeon: Izora Gala, MD;  [facility];  Service: ENT;  Laterality: N/A;  . ORIF TIBIA PLATEAU Left 08/13/2017   Procedure: OPEN REDUCTION INTERNAL FIXATION (ORIF) TIBIAL PLATEAU;  Surgeon: Leandrew Koyanagi, MD;  Location: [place];  Service: Orthopedics;  Laterality: Left;  . ORIF TIBIA PLATEAU Right 08/22/2017   Procedure: OPEN REDUCTION INTERNAL FIXATION (ORIF) RIGHT BICONDYLAR TIBIAL PLATEAU, REMOVAL OF EX FIX;  Surgeon: Leandrew Koyanagi, MD;  Location: [place];  Service: Orthopedics;  Laterality: Right;  . RIGHT/LEFT HEART CATH AND CORONARY ANGIOGRAPHY N/A 07/12/2017   Procedure: RIGHT/LEFT HEART CATH AND CORONARY ANGIOGRAPHY;  Surgeon: Burnell Blanks, MD;  Location: [place] [facility];  Service: Cardiovascular;  Laterality: N/A;  . TOOTH EXTRACTION    . VALVE REPLACEMENT  1998   St. Jude, mitral    Social History   Socioeconomic History  .  Marital status: Married    Spouse name: Frenchie   . Number of children: 2  . Years of education: Not on file  . Highest education level: Not on file  Occupational History  . Occupation: Engineer, agricultural    Employer: [employer]  Social Needs  . Financial resource strain: Not on file  . Food insecurity:    Worry: Not on file    Inability: Not on file  . Transportation needs:    Medical: Not on file    Non-medical: Not on file  Tobacco Use  . Smoking status: Never Smoker  . Smokeless tobacco: Never Used  Substance and Sexual Activity  . Alcohol use: Never    Frequency: Never  . Drug use: Never  . Sexual activity: Not Currently  Lifestyle  . Physical activity:    Days per week: Not on file    Minutes per session: Not on file  . Stress: Not on file  Relationships  . Social connections:    Talks on phone: Not on file    Gets together: Not on file    Attends religious service: Not on file    Active member of club or organization: Not on file    Attends meetings of clubs or organizations: Not on file    Relationship status: Not on file  . Intimate partner violence:    Fear of current or ex partner: Not on file    Emotionally abused: Not on file    Physically abused: Not on file    Forced sexual activity: Not on file  Other Topics Concern  . Not on file  Social History Narrative   ** Merged History Encounter **       Some exercise, bike and walking.  2 cups per day.     Family History  Problem Relation Age of Onset  . Heart attack Father 24  . Colon cancer Mother 21  . Hypertension Mother     Outpatient Encounter Medications as of 11/29/2017  Medication Sig  . acetaminophen (TYLENOL) 500 MG tablet Take 2 tablets (1,000 mg total) by mouth every 8 (eight) hours.  Marland Kitchen albuterol (PROAIR HFA) 108 (90 Base) MCG/ACT inhaler Inhale 2 puffs into the lungs every 6 (six) hours as needed for wheezing.  Marland Kitchen ALPRAZolam (XANAX) 0.5 MG tablet TAKE 1 TABLET BY MOUTH ONCE DAILY AS NEEDED FOR  ANXIETY  . aspirin 81 MG EC tablet Take 81 mg by mouth daily.   . cholecalciferol (VITAMIN D) 1000 units tablet Take 1,000 Units by mouth daily.  Marland Kitchen co-enzyme Q-10 30 MG capsule Take 30 mg by  mouth daily.  Marland Kitchen docusate sodium (COLACE) 100 MG capsule Take 1 capsule (100 mg total) by mouth 2 (two) times daily.  . Flax Oil-Fish Oil-Borage Oil (FISH OIL-FLAX OIL-BORAGE OIL) CAPS Take 1 capsule by mouth daily.  . fluticasone (FLONASE) 50 MCG/ACT nasal spray Place 1 spray into both nostrils daily.  . hydrochlorothiazide (HYDRODIURIL) 25 MG tablet TAKE 1 TABLET BY MOUTH ONCE DAILY  . methocarbamol (ROBAXIN) 500 MG tablet Take 1 tablet (500 mg total) by mouth every 8 (eight) hours as needed for muscle spasms.  . metoprolol succinate (TOPROL-XL) 100 MG 24 hr tablet Take 100 mg by mouth daily.  . metoprolol succinate (TOPROL-XL) 50 MG 24 hr tablet TAKE 1 TABLET BY MOUTH ONCE DAILY  . mouth rinse LIQD solution 15 mLs by Mouth Rinse route 2 (two) times daily.  . Multiple Vitamin (MULTIVITAMIN) tablet Take 1 tablet by mouth daily.  Marland Kitchen PARoxetine (PAXIL) 20 MG tablet Take 1 tablet (20 mg total) by mouth daily.  . polyethylene glycol (MIRALAX / GLYCOLAX) packet Take 17 g by mouth 2 (two) times daily.  . SYMBICORT 160-4.5 MCG/ACT inhaler Inhale 1 puff into the lungs 2 (two) times daily.  Marland Kitchen warfarin (COUMADIN) 10 MG tablet Take 1 tablet (10 mg total) by mouth daily. Take As Directed  . [DISCONTINUED] metoprolol succinate (TOPROL-XL) 50 MG 24 hr tablet Take 150 mg by mouth daily.   . [DISCONTINUED] PARoxetine (PAXIL) 20 MG tablet Take 0.5 tablets (10 mg total) by mouth daily for 8 days, THEN 1 tablet (20 mg total) daily for 22 days.   No facility-administered encounter medications on file as of 11/29/2017.          Objective:   Physical Exam  Constitutional: He is oriented to person, place, and time. He appears well-developed and well-nourished.  HENT:  Head: Normocephalic and atraumatic.  Cardiovascular:  Normal rate, regular rhythm and normal heart sounds.  Pulmonary/Chest: Effort normal and breath sounds normal.  Neurological: He is alert and oriented to person, place, and time.  Skin: Skin is warm and dry.  Psychiatric: He has a normal mood and affect. His behavior is normal.          Assessment & Plan:  HTN - Well controlled.  We are actually to stop his HCTZ to see if this is contributing to some of his constipation.  He will monitor his blood pressure at home and call me if his pressures elevate greater than 130.  If they do then we may need to replace it with something else may be amlodipine.. Follow up in  2-3 months.   Anxiety -controlled.  PHQ 9 score of 0 and gad 7 score of 0.  We discussed options he would like to stay on his current dose.  Follow-up in 3 months.  Insomnia-did encourage him to consider a trial of the melatonin to help with his sleep.  I am also hoping that is his anxiety improves the sleep will improve as well.  Constipation-we discussed some strategies around that.  He is actually on a hold his hydrochlorothiazide for the next couple weeks and see if this improves.  He is got a blood pressure cuff at home as if his blood pressure start to elevate he will give me a call and we may need to find a replacement medication that is not a diuretic.  Continue with prune juice and Colace as needed.

## 2017-11-30 DIAGNOSIS — M25561 Pain in right knee: Secondary | ICD-10-CM | POA: Diagnosis not present

## 2017-11-30 DIAGNOSIS — M25661 Stiffness of right knee, not elsewhere classified: Secondary | ICD-10-CM | POA: Diagnosis not present

## 2017-11-30 DIAGNOSIS — M62561 Muscle wasting and atrophy, not elsewhere classified, right lower leg: Secondary | ICD-10-CM | POA: Diagnosis not present

## 2017-11-30 DIAGNOSIS — M25461 Effusion, right knee: Secondary | ICD-10-CM | POA: Diagnosis not present

## 2017-12-03 DIAGNOSIS — M25661 Stiffness of right knee, not elsewhere classified: Secondary | ICD-10-CM | POA: Diagnosis not present

## 2017-12-03 DIAGNOSIS — M62561 Muscle wasting and atrophy, not elsewhere classified, right lower leg: Secondary | ICD-10-CM | POA: Diagnosis not present

## 2017-12-03 DIAGNOSIS — M25561 Pain in right knee: Secondary | ICD-10-CM | POA: Diagnosis not present

## 2017-12-03 DIAGNOSIS — M25461 Effusion, right knee: Secondary | ICD-10-CM | POA: Diagnosis not present

## 2017-12-04 ENCOUNTER — Encounter: Payer: Self-pay | Admitting: Internal Medicine

## 2017-12-05 DIAGNOSIS — M25661 Stiffness of right knee, not elsewhere classified: Secondary | ICD-10-CM | POA: Diagnosis not present

## 2017-12-05 DIAGNOSIS — M62561 Muscle wasting and atrophy, not elsewhere classified, right lower leg: Secondary | ICD-10-CM | POA: Diagnosis not present

## 2017-12-05 DIAGNOSIS — M25461 Effusion, right knee: Secondary | ICD-10-CM | POA: Diagnosis not present

## 2017-12-05 DIAGNOSIS — M25561 Pain in right knee: Secondary | ICD-10-CM | POA: Diagnosis not present

## 2017-12-12 ENCOUNTER — Ambulatory Visit (INDEPENDENT_AMBULATORY_CARE_PROVIDER_SITE_OTHER): Payer: Medicare Other | Admitting: *Deleted

## 2017-12-12 ENCOUNTER — Encounter (INDEPENDENT_AMBULATORY_CARE_PROVIDER_SITE_OTHER): Payer: Self-pay

## 2017-12-12 DIAGNOSIS — I059 Rheumatic mitral valve disease, unspecified: Secondary | ICD-10-CM | POA: Diagnosis not present

## 2017-12-12 DIAGNOSIS — M25561 Pain in right knee: Secondary | ICD-10-CM | POA: Diagnosis not present

## 2017-12-12 DIAGNOSIS — Z7901 Long term (current) use of anticoagulants: Secondary | ICD-10-CM | POA: Diagnosis not present

## 2017-12-12 DIAGNOSIS — M62561 Muscle wasting and atrophy, not elsewhere classified, right lower leg: Secondary | ICD-10-CM | POA: Diagnosis not present

## 2017-12-12 DIAGNOSIS — Z9889 Other specified postprocedural states: Secondary | ICD-10-CM | POA: Diagnosis not present

## 2017-12-12 DIAGNOSIS — M25461 Effusion, right knee: Secondary | ICD-10-CM | POA: Diagnosis not present

## 2017-12-12 DIAGNOSIS — M25661 Stiffness of right knee, not elsewhere classified: Secondary | ICD-10-CM | POA: Diagnosis not present

## 2017-12-12 LAB — POCT INR: INR: 1.7 — AB (ref 2.0–3.0)

## 2017-12-12 NOTE — Patient Instructions (Addendum)
  Description   Today take 2 tablets then change your dose to 1 tablet daily except 1.5 tablets on Sundays and Wednesdays.   Recheck in 2 weeks.  Coumadin Clinic [phone]

## 2017-12-14 DIAGNOSIS — M25461 Effusion, right knee: Secondary | ICD-10-CM | POA: Diagnosis not present

## 2017-12-14 DIAGNOSIS — M62561 Muscle wasting and atrophy, not elsewhere classified, right lower leg: Secondary | ICD-10-CM | POA: Diagnosis not present

## 2017-12-14 DIAGNOSIS — M25561 Pain in right knee: Secondary | ICD-10-CM | POA: Diagnosis not present

## 2017-12-14 DIAGNOSIS — M25661 Stiffness of right knee, not elsewhere classified: Secondary | ICD-10-CM | POA: Diagnosis not present

## 2017-12-24 DIAGNOSIS — M25561 Pain in right knee: Secondary | ICD-10-CM | POA: Diagnosis not present

## 2017-12-24 DIAGNOSIS — M25661 Stiffness of right knee, not elsewhere classified: Secondary | ICD-10-CM | POA: Diagnosis not present

## 2017-12-24 DIAGNOSIS — M25461 Effusion, right knee: Secondary | ICD-10-CM | POA: Diagnosis not present

## 2017-12-24 DIAGNOSIS — M62561 Muscle wasting and atrophy, not elsewhere classified, right lower leg: Secondary | ICD-10-CM | POA: Diagnosis not present

## 2017-12-25 ENCOUNTER — Ambulatory Visit (INDEPENDENT_AMBULATORY_CARE_PROVIDER_SITE_OTHER): Payer: Medicare Other | Admitting: *Deleted

## 2017-12-25 DIAGNOSIS — I059 Rheumatic mitral valve disease, unspecified: Secondary | ICD-10-CM | POA: Diagnosis not present

## 2017-12-25 DIAGNOSIS — Z9889 Other specified postprocedural states: Secondary | ICD-10-CM

## 2017-12-25 DIAGNOSIS — Z7901 Long term (current) use of anticoagulants: Secondary | ICD-10-CM | POA: Diagnosis not present

## 2017-12-25 LAB — POCT INR: INR: 2.7 (ref 2.0–3.0)

## 2017-12-25 NOTE — Patient Instructions (Signed)
Description   Continue taking 1 tablet daily except 1.5 tablets on Sundays and Wednesdays.   Recheck in 2 weeks.  [facility] [phone]

## 2017-12-26 DIAGNOSIS — M25661 Stiffness of right knee, not elsewhere classified: Secondary | ICD-10-CM | POA: Diagnosis not present

## 2017-12-26 DIAGNOSIS — M62561 Muscle wasting and atrophy, not elsewhere classified, right lower leg: Secondary | ICD-10-CM | POA: Diagnosis not present

## 2017-12-26 DIAGNOSIS — M25561 Pain in right knee: Secondary | ICD-10-CM | POA: Diagnosis not present

## 2017-12-26 DIAGNOSIS — M25461 Effusion, right knee: Secondary | ICD-10-CM | POA: Diagnosis not present

## 2017-12-28 DIAGNOSIS — M25561 Pain in right knee: Secondary | ICD-10-CM | POA: Diagnosis not present

## 2017-12-28 DIAGNOSIS — M25661 Stiffness of right knee, not elsewhere classified: Secondary | ICD-10-CM | POA: Diagnosis not present

## 2017-12-28 DIAGNOSIS — M62561 Muscle wasting and atrophy, not elsewhere classified, right lower leg: Secondary | ICD-10-CM | POA: Diagnosis not present

## 2017-12-28 DIAGNOSIS — M25461 Effusion, right knee: Secondary | ICD-10-CM | POA: Diagnosis not present

## 2017-12-31 DIAGNOSIS — M25561 Pain in right knee: Secondary | ICD-10-CM | POA: Diagnosis not present

## 2017-12-31 DIAGNOSIS — M25661 Stiffness of right knee, not elsewhere classified: Secondary | ICD-10-CM | POA: Diagnosis not present

## 2017-12-31 DIAGNOSIS — M25461 Effusion, right knee: Secondary | ICD-10-CM | POA: Diagnosis not present

## 2017-12-31 DIAGNOSIS — M62561 Muscle wasting and atrophy, not elsewhere classified, right lower leg: Secondary | ICD-10-CM | POA: Diagnosis not present

## 2018-01-02 DIAGNOSIS — M25461 Effusion, right knee: Secondary | ICD-10-CM | POA: Diagnosis not present

## 2018-01-02 DIAGNOSIS — M25661 Stiffness of right knee, not elsewhere classified: Secondary | ICD-10-CM | POA: Diagnosis not present

## 2018-01-02 DIAGNOSIS — M25561 Pain in right knee: Secondary | ICD-10-CM | POA: Diagnosis not present

## 2018-01-02 DIAGNOSIS — M62561 Muscle wasting and atrophy, not elsewhere classified, right lower leg: Secondary | ICD-10-CM | POA: Diagnosis not present

## 2018-01-04 DIAGNOSIS — M25461 Effusion, right knee: Secondary | ICD-10-CM | POA: Diagnosis not present

## 2018-01-04 DIAGNOSIS — M25661 Stiffness of right knee, not elsewhere classified: Secondary | ICD-10-CM | POA: Diagnosis not present

## 2018-01-04 DIAGNOSIS — M62561 Muscle wasting and atrophy, not elsewhere classified, right lower leg: Secondary | ICD-10-CM | POA: Diagnosis not present

## 2018-01-04 DIAGNOSIS — M25561 Pain in right knee: Secondary | ICD-10-CM | POA: Diagnosis not present

## 2018-01-07 ENCOUNTER — Ambulatory Visit: Payer: Medicare Other | Admitting: Cardiovascular Disease

## 2018-01-07 DIAGNOSIS — M25561 Pain in right knee: Secondary | ICD-10-CM | POA: Diagnosis not present

## 2018-01-07 DIAGNOSIS — M25461 Effusion, right knee: Secondary | ICD-10-CM | POA: Diagnosis not present

## 2018-01-07 DIAGNOSIS — M62561 Muscle wasting and atrophy, not elsewhere classified, right lower leg: Secondary | ICD-10-CM | POA: Diagnosis not present

## 2018-01-07 DIAGNOSIS — M25661 Stiffness of right knee, not elsewhere classified: Secondary | ICD-10-CM | POA: Diagnosis not present

## 2018-01-09 DIAGNOSIS — M25461 Effusion, right knee: Secondary | ICD-10-CM | POA: Diagnosis not present

## 2018-01-09 DIAGNOSIS — M25661 Stiffness of right knee, not elsewhere classified: Secondary | ICD-10-CM | POA: Diagnosis not present

## 2018-01-09 DIAGNOSIS — M25561 Pain in right knee: Secondary | ICD-10-CM | POA: Diagnosis not present

## 2018-01-09 DIAGNOSIS — M62561 Muscle wasting and atrophy, not elsewhere classified, right lower leg: Secondary | ICD-10-CM | POA: Diagnosis not present

## 2018-01-14 DIAGNOSIS — M25561 Pain in right knee: Secondary | ICD-10-CM | POA: Diagnosis not present

## 2018-01-14 DIAGNOSIS — M62561 Muscle wasting and atrophy, not elsewhere classified, right lower leg: Secondary | ICD-10-CM | POA: Diagnosis not present

## 2018-01-14 DIAGNOSIS — M25661 Stiffness of right knee, not elsewhere classified: Secondary | ICD-10-CM | POA: Diagnosis not present

## 2018-01-14 DIAGNOSIS — M25461 Effusion, right knee: Secondary | ICD-10-CM | POA: Diagnosis not present

## 2018-01-16 ENCOUNTER — Ambulatory Visit: Payer: Medicare Other | Admitting: Internal Medicine

## 2018-01-17 ENCOUNTER — Encounter: Payer: Self-pay | Admitting: Gastroenterology

## 2018-01-17 ENCOUNTER — Ambulatory Visit (INDEPENDENT_AMBULATORY_CARE_PROVIDER_SITE_OTHER): Payer: Medicare Other | Admitting: Gastroenterology

## 2018-01-17 ENCOUNTER — Telehealth: Payer: Self-pay | Admitting: *Deleted

## 2018-01-17 VITALS — BP 132/80 | HR 61 | Ht 76.0 in | Wt 181.0 lb

## 2018-01-17 DIAGNOSIS — Z860100 Personal history of colon polyps, unspecified: Secondary | ICD-10-CM | POA: Insufficient documentation

## 2018-01-17 DIAGNOSIS — Z7901 Long term (current) use of anticoagulants: Secondary | ICD-10-CM

## 2018-01-17 DIAGNOSIS — Z800 Family history of malignant neoplasm of digestive organs: Secondary | ICD-10-CM | POA: Insufficient documentation

## 2018-01-17 MED ORDER — SUPREP BOWEL PREP KIT 17.5-3.13-1.6 GM/177ML PO SOLN: 1.0000 | ORAL | 0 refills | Status: DC

## 2018-01-17 NOTE — Progress Notes (Signed)
01/17/2018 Corey Mathis [id number] Jul 02, 1950   HISTORY OF PRESENT ILLNESS: This is a 67 year old male who is new to our practice.  He presents here today to discuss colonoscopy.  It appears his last colonoscopy was in 2012 at which time he had one adenomatous polyp removed.  He also is a family history of colon cancer in his mother.  Reason for his visit today is the fact that he is on Coumadin for history of mitral valve replacement in 1998.  He follows with Dr. [name] and the Coumadin clinic for that.  He says that they usually put him on Lovenox when he has to hold the Coumadin for any other procedures, surgeries, etc.  He has no complaints including rectal bleeding, bowel issues, etc.   Past Medical History:  Diagnosis Date  . Allergy   . Allergy history unknown   . Anxiety   . Asthma   . Asthma   . CHF (congestive heart failure) ([place])   . Dyslipidemia   . History of mitral valve replacement   . HTN (hypertension)   . Hypertension   . Labyrinthitis    hx  . Mitral valve replaced    hx   Past Surgical History:  Procedure Laterality Date  . ACNE CYST REMOVAL     from hand and back  . EXTERNAL FIXATION LEG Right 08/11/2017   Procedure: EXTERNAL FIXATION, RIGHT KNEE;  Surgeon: Leandrew Koyanagi, MD;  Location: [place];  Service: Orthopedics;  Laterality: Right;  . FINE NEEDLE ASPIRATION Right 08/11/2017   Procedure: FINE NEEDLE ASPIRATION  of right Knee with 80 cc of dark red fluid removed;  Surgeon: Leandrew Koyanagi, MD;  Location: [place];  Service: Orthopedics;  Laterality: Right;  . Jaw surgery (other)    . NASAL SINUS SURGERY N/A 10/16/2012   Procedure: NASAL ENDOSCOPIC POLYPECTOMY/MAXILLARY ANTROSTOMY/ETHMOIDECTOMY;  Surgeon: Izora Gala, MD;  Location: [place];  Service: ENT;  Laterality: N/A;  . ORIF TIBIA PLATEAU Left 08/13/2017   Procedure: OPEN REDUCTION INTERNAL FIXATION (ORIF) TIBIAL PLATEAU;  Surgeon: Leandrew Koyanagi, MD;  Location: [place];  Service: Orthopedics;   Laterality: Left;  . ORIF TIBIA PLATEAU Right 08/22/2017   Procedure: OPEN REDUCTION INTERNAL FIXATION (ORIF) RIGHT BICONDYLAR TIBIAL PLATEAU, REMOVAL OF EX FIX;  Surgeon: Leandrew Koyanagi, MD;  Location: [place];  Service: Orthopedics;  Laterality: Right;  . RIGHT/LEFT HEART CATH AND CORONARY ANGIOGRAPHY N/A 07/12/2017   Procedure: RIGHT/LEFT HEART CATH AND CORONARY ANGIOGRAPHY;  Surgeon: Burnell Blanks, MD;  Location: [place] [facility];  Service: Cardiovascular;  Laterality: N/A;  . TOOTH EXTRACTION    . VALVE REPLACEMENT  1998   St. Jude, mitral    reports that he has never smoked. He has never used smokeless tobacco. He reports that he does not drink alcohol or use drugs. family history includes Colon cancer (age of onset: 8) in his mother; Heart attack (age of onset: 63) in his father; Hypertension in his mother. Allergies  Allergen Reactions  . Lisinopril Anaphylaxis and Other (See Comments)    angioedema  . Lisinopril Shortness Of Breath  . Carvedilol Other (See Comments)    wheezing  . Codeine Sulfate Nausea Only  . Ezetimibe-Simvastatin Other (See Comments)    Serious vertigo  . Propranolol Hcl Other (See Comments)    wheezing  . Ramipril Other (See Comments)    wheezing  . Codeine Nausea Only      Outpatient Encounter Medications as of  01/17/2018  Medication Sig  . acetaminophen (TYLENOL) 500 MG tablet Take 2 tablets (1,000 mg total) by mouth every 8 (eight) hours.  Marland Kitchen albuterol (PROAIR HFA) 108 (90 Base) MCG/ACT inhaler Inhale 2 puffs into the lungs every 6 (six) hours as needed for wheezing.  Marland Kitchen ALPRAZolam (XANAX) 0.5 MG tablet TAKE 1 TABLET BY MOUTH ONCE DAILY AS NEEDED FOR ANXIETY  . aspirin 81 MG EC tablet Take 81 mg by mouth daily.   . cholecalciferol (VITAMIN D) 1000 units tablet Take 1,000 Units by mouth daily.  Marland Kitchen co-enzyme Q-10 30 MG capsule Take 30 mg by mouth daily.  Marland Kitchen docusate sodium (COLACE) 100 MG capsule Take 1 capsule (100 mg total) by mouth 2 (two)  times daily.  . Flax Oil-Fish Oil-Borage Oil (FISH OIL-FLAX OIL-BORAGE OIL) CAPS Take 1 capsule by mouth daily.  . fluticasone (FLONASE) 50 MCG/ACT nasal spray Place 1 spray into both nostrils daily.  . methocarbamol (ROBAXIN) 500 MG tablet Take 1 tablet (500 mg total) by mouth every 8 (eight) hours as needed for muscle spasms.  . metoprolol succinate (TOPROL-XL) 100 MG 24 hr tablet Take 100 mg by mouth daily.  . metoprolol succinate (TOPROL-XL) 50 MG 24 hr tablet TAKE 1 TABLET BY MOUTH ONCE DAILY  . mouth rinse LIQD solution 15 mLs by Mouth Rinse route 2 (two) times daily.  . Multiple Vitamin (MULTIVITAMIN) tablet Take 1 tablet by mouth daily.  Marland Kitchen PARoxetine (PAXIL) 20 MG tablet Take 1 tablet (20 mg total) by mouth daily.  . polyethylene glycol (MIRALAX / GLYCOLAX) packet Take 17 g by mouth 2 (two) times daily.  . SYMBICORT 160-4.5 MCG/ACT inhaler Inhale 1 puff into the lungs 2 (two) times daily.  Marland Kitchen warfarin (COUMADIN) 10 MG tablet Take 1 tablet (10 mg total) by mouth daily. Take As Directed  . [DISCONTINUED] hydrochlorothiazide (HYDRODIURIL) 25 MG tablet TAKE 1 TABLET BY MOUTH ONCE DAILY   No facility-administered encounter medications on file as of 01/17/2018.      REVIEW OF SYSTEMS  : All other systems reviewed and negative except where noted in the History of Present Illness.   PHYSICAL EXAM: BP 132/80   Pulse 61   Ht 6\' 4"  (1.93 m)   Wt 181 lb (82.1 kg)   SpO2 99%   BMI 22.03 kg/m  General: Well developed black male in no acute distress Head: Normocephalic and atraumatic Eyes:  Sclerae anicteric, conjunctiva pink. Ears: Normal auditory acuity Lungs: Clear throughout to auscultation; no increased WOB. Heart: Regular rate and rhythm; click heard from artificial mitral valve. Abdomen: Soft, non-distended.  BS present.  Non-tender. Rectal:  Will be done at the time of colonoscopy. Musculoskeletal: Symmetrical with no gross deformities  Skin: No lesions on visible  extremities Extremities: No edema  Neurological: Alert oriented x 4, grossly non-focal. Psychological:  Alert and cooperative. Normal mood and affect  ASSESSMENT AND PLAN: *Family history of colon cancer in mother and personal history adenomatous polyps:  Last colonoscopy was 2012.  Will plan for colonoscopy with Dr. Rush Landmark. *Chronic anticoagulation with coumadin for history of mitral valve replacement 21 years ago:  Will hold coumadin for 5 days prior to endoscopic procedures - will instruct when and how to resume after procedure. Benefits and risks of procedure explained including risks of bleeding, perforation, infection, missed lesions, reactions to medications and possible need for hospitalization and surgery for complications. Additional rare but real risk of stroke or other vascular clotting events off of Coumadin also explained and need to  seek urgent help if any signs of these problems occur. Will communicate by phone or EMR with patient's  prescribing provider, Dr. [name], to confirm that holding coumadin is reasonable in this case.  He says that they usually put him on Lovenox.   CC:  [name], *

## 2018-01-17 NOTE — Telephone Encounter (Signed)
Patient with history of mitral valve replacement on warfarin for anticoagulation.    Procedure: colonoscopy Date of procedure: 02/07/18  Per office protocol, patient can hold warfarin for 5 days prior to procedure WITH Lovenox (enoxaparin) bridging around procedure.  Also of note pt overdue to see coumadin clinic. LMOM to call and schedule appt.

## 2018-01-17 NOTE — Telephone Encounter (Signed)
Request for surgical clearance:     Endoscopy Procedure  What type of surgery is being performed?     colonoscopy  When is this surgery scheduled?     02/07/18  What type of clearance is required ?   Pharmacy  Are there any medications that need to be held prior to surgery and how long? coumadinx 5 days; lovenox bridge?  Practice name and name of physician performing surgery?      [facility]  What is your office phone and fax number?      Phone- [phone]  [phone]  Anesthesia type (None, local, MAC, general) ?       MAC

## 2018-01-17 NOTE — Telephone Encounter (Signed)
Pharm please address 

## 2018-01-17 NOTE — Patient Instructions (Signed)
You have been scheduled for a colonoscopy. Please follow written instructions given to you at your visit today.  Please pick up your prep supplies at the pharmacy within the next 1-3 days. If you use inhalers (even only as needed), please bring them with you on the day of your procedure. Your physician has requested that you go to www.startemmi.com and enter the access code given to you at your visit today. This web site gives a general overview about your procedure. However, you should still follow specific instructions given to you by our office regarding your preparation for the procedure.  You will be contaced by our office prior to your procedure for directions on holding your Coumadin/Warfarin.  If you do not hear from our office 1 week prior to your scheduled procedure, please call [phone] to discuss.  If you are age 11 or older, your body mass index should be between 23-30. Your Body mass index is 22.03 kg/m. If this is out of the aforementioned range listed, please consider follow up with your Primary Care Provider.  If you are age 33 or younger, your body mass index should be between 19-25. Your Body mass index is 22.03 kg/m. If this is out of the aformentioned range listed, please consider follow up with your Primary Care Provider.

## 2018-01-18 DIAGNOSIS — M25461 Effusion, right knee: Secondary | ICD-10-CM | POA: Diagnosis not present

## 2018-01-18 DIAGNOSIS — M62561 Muscle wasting and atrophy, not elsewhere classified, right lower leg: Secondary | ICD-10-CM | POA: Diagnosis not present

## 2018-01-18 DIAGNOSIS — M25661 Stiffness of right knee, not elsewhere classified: Secondary | ICD-10-CM | POA: Diagnosis not present

## 2018-01-18 DIAGNOSIS — M25561 Pain in right knee: Secondary | ICD-10-CM | POA: Diagnosis not present

## 2018-01-18 NOTE — Telephone Encounter (Signed)
See attached information on coumadin, pt will be seen in our coumadin clinic next week to arrange lovenox bridge.

## 2018-01-21 ENCOUNTER — Ambulatory Visit (INDEPENDENT_AMBULATORY_CARE_PROVIDER_SITE_OTHER): Payer: Medicare Other | Admitting: Pharmacist

## 2018-01-21 DIAGNOSIS — Z7901 Long term (current) use of anticoagulants: Secondary | ICD-10-CM | POA: Diagnosis not present

## 2018-01-21 DIAGNOSIS — M25661 Stiffness of right knee, not elsewhere classified: Secondary | ICD-10-CM | POA: Diagnosis not present

## 2018-01-21 DIAGNOSIS — M62561 Muscle wasting and atrophy, not elsewhere classified, right lower leg: Secondary | ICD-10-CM | POA: Diagnosis not present

## 2018-01-21 DIAGNOSIS — M25561 Pain in right knee: Secondary | ICD-10-CM | POA: Diagnosis not present

## 2018-01-21 DIAGNOSIS — M25461 Effusion, right knee: Secondary | ICD-10-CM | POA: Diagnosis not present

## 2018-01-21 DIAGNOSIS — Z9889 Other specified postprocedural states: Secondary | ICD-10-CM | POA: Diagnosis not present

## 2018-01-21 DIAGNOSIS — I059 Rheumatic mitral valve disease, unspecified: Secondary | ICD-10-CM

## 2018-01-21 LAB — POCT INR: INR: 1.5 — AB (ref 2.0–3.0)

## 2018-01-21 MED ORDER — ENOXAPARIN SODIUM 80 MG/0.8ML ~~LOC~~ SOLN: 80.0000 mg | Freq: Two times a day (BID) | SUBCUTANEOUS | 1 refills | Status: DC

## 2018-01-21 NOTE — Patient Instructions (Addendum)
Take 1.5 tablets tonight and tomorrow night. Then continue taking 1 tablet daily except 1.5 tablets on Sundays and Wednesdays.  Last dose of coumadin 11/29 prior to colonoscopy. Recheck on 12/10.  Coumadin Clinic [phone]   11/29 - Last dose of coumadin 11/30 - No coumadin. No Lovenox. 12/1 - Inject Lovenox 80mg  subcutaneously into the fatty tissue in the skin every 12 hours. No Coumadin. 12/2 - Inject Lovenox every 12 hours. No Coumadin 12/3 - Inject Lovenox every 12 hours. No Coumadin 12/4 - Inject Lovenox at 8am (Hold PM dose). No Coumadin 12/5 - Procedure Day (begin Coumadin 15mg  after procedure in the evening). No Lovenox. 12/6 - Restart Lovenox twice daily and Coumadin 15mg  12/7 - Lovenox twice daily and Coumadin 10mg   12/8 - Lovenox twice daily and Coumadin 15mg  12/9 - Lovenox twice daily and Coumadin 10mg  12/10 - INR check

## 2018-01-21 NOTE — Progress Notes (Signed)
Agree with assessment and plan. Once anticoagulation plan is scheduled will plan to proceed with colonoscopy.

## 2018-01-21 NOTE — Telephone Encounter (Signed)
See 01/21/18 coumadin clinic note showing lovenox bridge. I have spoken to patient to confirm that he has received instructions and he assures me that he has received these.

## 2018-01-23 DIAGNOSIS — M25661 Stiffness of right knee, not elsewhere classified: Secondary | ICD-10-CM | POA: Diagnosis not present

## 2018-01-23 DIAGNOSIS — M25461 Effusion, right knee: Secondary | ICD-10-CM | POA: Diagnosis not present

## 2018-01-23 DIAGNOSIS — M62561 Muscle wasting and atrophy, not elsewhere classified, right lower leg: Secondary | ICD-10-CM | POA: Diagnosis not present

## 2018-01-23 DIAGNOSIS — M25561 Pain in right knee: Secondary | ICD-10-CM | POA: Diagnosis not present

## 2018-01-28 DIAGNOSIS — M25561 Pain in right knee: Secondary | ICD-10-CM | POA: Diagnosis not present

## 2018-01-28 DIAGNOSIS — M25461 Effusion, right knee: Secondary | ICD-10-CM | POA: Diagnosis not present

## 2018-01-28 DIAGNOSIS — M62561 Muscle wasting and atrophy, not elsewhere classified, right lower leg: Secondary | ICD-10-CM | POA: Diagnosis not present

## 2018-01-28 DIAGNOSIS — M25661 Stiffness of right knee, not elsewhere classified: Secondary | ICD-10-CM | POA: Diagnosis not present

## 2018-01-29 ENCOUNTER — Encounter: Payer: Self-pay | Admitting: Family Medicine

## 2018-01-29 ENCOUNTER — Ambulatory Visit (INDEPENDENT_AMBULATORY_CARE_PROVIDER_SITE_OTHER): Payer: Medicare Other | Admitting: Family Medicine

## 2018-01-29 VITALS — BP 126/66 | HR 65 | Ht 76.0 in | Wt 182.0 lb

## 2018-01-29 DIAGNOSIS — F5101 Primary insomnia: Secondary | ICD-10-CM | POA: Diagnosis not present

## 2018-01-29 DIAGNOSIS — F419 Anxiety disorder, unspecified: Secondary | ICD-10-CM | POA: Diagnosis not present

## 2018-01-29 DIAGNOSIS — K5903 Drug induced constipation: Secondary | ICD-10-CM

## 2018-01-29 MED ORDER — TRAZODONE HCL 50 MG PO TABS: 25.0000 mg | ORAL_TABLET | Freq: Every evening | ORAL | 3 refills | Status: DC | PRN

## 2018-01-29 NOTE — Progress Notes (Signed)
  CC:   HPI:  67 -year-old male is here today to follow-up on a couple different issues.  Insomnia-recommended a trial of melatonin.  I want him to try something natural before we started medication.  Because we had also recently switched him to Paxil and I was hoping that maybe once we got his anxiety under better control that the sleep would also follow.  Constipation-does have a colonoscopy scheduled for December 5.  Anxiety-he was switched to Paxil a few months ago.  He still doing well on this regimen feels like it is working better than the BuSpar.  Is no longer using the Xanax.  Past medical history, Surgical history, Family history not pertinant except as noted below, Social history, Allergies, and medications have been entered into the medical record, reviewed, and corrections made.   Review of Systems: No fevers, chills, night sweats, weight loss, chest pain, or shortness of breath.   Objective:    General: Well Developed, well nourished, and in no acute distress.  Neuro: Alert and oriented x3, extra-ocular muscles intact, sensation grossly intact.  HEENT: Normocephalic, atraumatic  Skin: Warm and dry, no rashes. Cardiac: Regular rate and rhythm, no murmurs rubs or gallops, no lower extremity edema.  Respiratory: Clear to auscultation bilaterally. Not using accessory muscles, speaking in full sentences.   Impression and Recommendations:    Drug induced constipation -reprinted instructions for stopping Coumadin and using Lovenox for his colonoscopy procedure.  Unfortunately he had misplaced them.  But he is scheduled for December 5.  Anxiety -current regimen.  Doing really well on Paxil.  Insomnia -   discussed options.  He would still like to try the melatonin first which she has not had a chance to do.  We also discussed prescription options and potential side effects of those.  We did decide to go ahead and print a prescription for trazodone for him to start if the  melatonin is not helpful.  Again warned about potential side effects and excess sedation.  Call if any problems or concerns.

## 2018-01-30 DIAGNOSIS — M25561 Pain in right knee: Secondary | ICD-10-CM | POA: Diagnosis not present

## 2018-01-30 DIAGNOSIS — M62561 Muscle wasting and atrophy, not elsewhere classified, right lower leg: Secondary | ICD-10-CM | POA: Diagnosis not present

## 2018-01-30 DIAGNOSIS — M25461 Effusion, right knee: Secondary | ICD-10-CM | POA: Diagnosis not present

## 2018-01-30 DIAGNOSIS — M25661 Stiffness of right knee, not elsewhere classified: Secondary | ICD-10-CM | POA: Diagnosis not present

## 2018-02-04 DIAGNOSIS — M25661 Stiffness of right knee, not elsewhere classified: Secondary | ICD-10-CM | POA: Diagnosis not present

## 2018-02-04 DIAGNOSIS — M25561 Pain in right knee: Secondary | ICD-10-CM | POA: Diagnosis not present

## 2018-02-04 DIAGNOSIS — M62561 Muscle wasting and atrophy, not elsewhere classified, right lower leg: Secondary | ICD-10-CM | POA: Diagnosis not present

## 2018-02-04 DIAGNOSIS — M25461 Effusion, right knee: Secondary | ICD-10-CM | POA: Diagnosis not present

## 2018-02-06 DIAGNOSIS — M25461 Effusion, right knee: Secondary | ICD-10-CM | POA: Diagnosis not present

## 2018-02-06 DIAGNOSIS — M62561 Muscle wasting and atrophy, not elsewhere classified, right lower leg: Secondary | ICD-10-CM | POA: Diagnosis not present

## 2018-02-06 DIAGNOSIS — M25661 Stiffness of right knee, not elsewhere classified: Secondary | ICD-10-CM | POA: Diagnosis not present

## 2018-02-06 DIAGNOSIS — M25561 Pain in right knee: Secondary | ICD-10-CM | POA: Diagnosis not present

## 2018-02-07 ENCOUNTER — Telehealth: Payer: Self-pay | Admitting: Gastroenterology

## 2018-02-07 ENCOUNTER — Ambulatory Visit (AMBULATORY_SURGERY_CENTER): Payer: Medicare Other | Admitting: Gastroenterology

## 2018-02-07 ENCOUNTER — Encounter: Payer: Self-pay | Admitting: Gastroenterology

## 2018-02-07 VITALS — BP 145/88 | HR 63 | Temp 97.5°F | Resp 9 | Ht 76.0 in | Wt 182.0 lb

## 2018-02-07 DIAGNOSIS — D124 Benign neoplasm of descending colon: Secondary | ICD-10-CM

## 2018-02-07 DIAGNOSIS — D125 Benign neoplasm of sigmoid colon: Secondary | ICD-10-CM

## 2018-02-07 DIAGNOSIS — K635 Polyp of colon: Secondary | ICD-10-CM

## 2018-02-07 DIAGNOSIS — Z86010 Personal history of colonic polyps: Secondary | ICD-10-CM

## 2018-02-07 MED ORDER — SODIUM CHLORIDE 0.9 % IV SOLN: 500.0000 mL | Freq: Once | INTRAVENOUS | Status: DC

## 2018-02-07 NOTE — Telephone Encounter (Signed)
Pt states he forgot and took his Lovenox injection last evening, he has not taken it this morning. He did complete his prep last night and is currently working on completing his prep this morning. Dr. Rush Landmark expressed that he is okay with proceeding with colonoscopy if pt is aware that he could have some slight increase in bleeding with polyp removal and if we find multiple polyps or large polyps, he may have to come back in to have a repeat procedure when he has been off the Lovenox. Notified patient and he agrees to proceed with colonoscopy today.

## 2018-02-07 NOTE — Patient Instructions (Signed)
Handouts: Polyps  YOU HAD AN ENDOSCOPIC PROCEDURE TODAY AT THE [facility]:   Refer to the procedure report that was given to you for any specific questions about what was found during the examination.  If the procedure report does not answer your questions, please call your gastroenterologist to clarify.  If you requested that your care partner not be given the details of your procedure findings, then the procedure report has been included in a sealed envelope for you to review at your convenience later.  YOU SHOULD EXPECT: Some feelings of bloating in the abdomen. Passage of more gas than usual.  Walking can help get rid of the air that was put into your GI tract during the procedure and reduce the bloating. If you had a lower endoscopy (such as a colonoscopy or flexible sigmoidoscopy) you may notice spotting of blood in your stool or on the toilet paper. If you underwent a bowel prep for your procedure, you may not have a normal bowel movement for a few days.  Please Note:  You might notice some irritation and congestion in your nose or some drainage.  This is from the oxygen used during your procedure.  There is no need for concern and it should clear up in a day or so.  SYMPTOMS TO REPORT IMMEDIATELY:   Following lower endoscopy (colonoscopy or flexible sigmoidoscopy):  Excessive amounts of blood in the stool  Significant tenderness or worsening of abdominal pains  Swelling of the abdomen that is new, acute  Fever of 100F or higher  For urgent or emergent issues, a gastroenterologist can be reached at any hour by calling [phone].   DIET:  We do recommend a small meal at first, but then you may proceed to your regular diet.  Drink plenty of fluids but you should avoid alcoholic beverages for 24 hours.  ACTIVITY:  You should plan to take it easy for the rest of today and you should NOT DRIVE or use heavy machinery until tomorrow (because of the sedation medicines used  during the test).    FOLLOW UP: Our staff will call the number listed on your records the next business day following your procedure to check on you and address any questions or concerns that you may have regarding the information given to you following your procedure. If we do not reach you, we will leave a message.  However, if you are feeling well and you are not experiencing any problems, there is no need to return our call.  We will assume that you have returned to your regular daily activities without incident.  If any biopsies were taken you will be contacted by phone or by letter within the next 1-3 weeks.  Please call us at [phone] if you have not heard about the biopsies in 3 weeks.    SIGNATURES/CONFIDENTIALITY: You and/or your care partner have signed paperwork which will be entered into your electronic medical record.  These signatures attest to the fact that that the information above on your After Visit Summary has been reviewed and is understood.  Full responsibility of the confidentiality of this discharge information lies with you and/or your care-partner. 

## 2018-02-07 NOTE — Progress Notes (Signed)
Called to room to assist during endoscopic procedure.  Patient ID and intended procedure confirmed with present staff. Received instructions for my participation in the procedure from the performing physician.  

## 2018-02-07 NOTE — Op Note (Signed)
[facility] Patient Name: Corey Mathis Procedure Date: 02/07/2018 3:09 PM MRN: [id number] Endoscopist: Justice Britain , MD Age: 67 Referring MD:  Date of Birth: [birth year] Gender: Male Account #: [id number] Procedure:                Colonoscopy Indications:              High risk colon cancer surveillance: Personal                            history of colonic polyps Medicines:                Monitored Anesthesia Care Procedure:                Pre-Anesthesia Assessment:                           - Prior to the procedure, a History and Physical                            was performed, and patient medications and                            allergies were reviewed. The patient's tolerance of                            previous anesthesia was also reviewed. The risks                            and benefits of the procedure and the sedation                            options and risks were discussed with the patient.                            All questions were answered, and informed consent                            was obtained. Prior Anticoagulants: The patient has                            taken Coumadin (warfarin), last dose was 5 days                            prior to procedure. ASA Grade Assessment: III - A                            patient with severe systemic disease. After                            reviewing the risks and benefits, the patient was                            deemed in satisfactory condition to undergo the  procedure.                           After obtaining informed consent, the colonoscope                            was passed under direct vision. Throughout the                            procedure, the patient's blood pressure, pulse, and                            oxygen saturations were monitored continuously. The                            Colonoscope was introduced through the anus and                             advanced to the the cecum, identified by                            appendiceal orifice and ileocecal valve. The                            colonoscopy was technically difficult and complex                            due to a redundant colon and a tortuous colon.                            Successful completion of the procedure was aided by                            changing the patient to a supine position, changing                            the patient to a prone position, using manual                            pressure, withdrawing and reinserting the scope,                            straightening and shortening the scope to obtain                            bowel loop reduction and using scope torsion. The                            patient tolerated the procedure. The quality of the                            bowel preparation was evaluated using the BBPS                            (  Boston Bowel Preparation Scale) with scores of:                            Right Colon = 3, Transverse Colon = 3 and Left                            Colon = 3 (entire mucosa seen well with no residual                            staining, small fragments of stool or opaque                            liquid). The total BBPS score equals 9. Scope In: 3:18:47 PM Scope Out: 7:78:24 PM Scope Withdrawal Time: 0 hours 23 minutes 54 seconds  Total Procedure Duration: 0 hours 37 minutes 25 seconds  Findings:                 The digital rectal exam findings include                            non-thrombosed internal hemorrhoids. Pertinent                            negatives include no palpable rectal lesions.                           The terminal ileum and ileocecal valve appeared                            normal.                           A 13 mm polyp was found in the descending colon.                            The polyp was semi-sessile. Preparations were made                            for mucosal  resection. Saline was injected to raise                            the lesion. Snare mucosal resection was performed.                            Resection and retrieval were complete. To prevent                            bleeding after mucosal resection, two hemostatic                            clips were successfully placed (MR conditional).                            There was no bleeding during, or at the  end, of the                            procedure.                           A 2 mm polyp was found in the recto-sigmoid colon.                            The polyp was sessile. The polyp was removed with a                            cold snare. Resection and retrieval were complete.                           Normal mucosa was found in the entire colon                            otherwise.                           Non-bleeding non-thrombosed internal hemorrhoids                            were found during retroflexion, during perianal                            exam and during digital exam. The hemorrhoids were                            Grade II (internal hemorrhoids that prolapse but                            reduce spontaneously). Complications:            No immediate complications. Estimated Blood Loss:     Estimated blood loss was minimal. Impression:               - Non-thrombosed internal hemorrhoids found on                            digital rectal exam.                           - The examined portion of the ileum was normal.                           - One 13 mm polyp in the descending colon, removed                            with mucosal resection. Resected and retrieved.                            Clips (MR conditional) were placed.                           -  One 2 mm polyp at the recto-sigmoid colon,                            removed with a cold snare. Resected and retrieved.                           - Otherwise, normal mucosa in the entire examined                             colon.                           - Non-bleeding non-thrombosed internal hemorrhoids. Recommendation:           - The patient will be observed post-procedure,                            until all discharge criteria are met.                           - Discharge patient to home.                           - Patient has a contact number available for                            emergencies. The signs and symptoms of potential                            delayed complications were discussed with the                            patient. Return to normal activities tomorrow.                            Written discharge instructions were provided to the                            patient.                           - High fiber diet.                           - Continue present medications. Due to patient's                            history of mitral valve repair, he is at increased                            risk of thromboembolic phenomena. After today's EMR                            he is at risk of bleeding, but I think, the based  on Cardiology/Anticoagulation clinic, his risk is                            increased enough that he should be started back on                            Coumadin as per his Anticoagulation clinic                            recommendations and close monitoring of his stool                            output for evidence of oozing/bleeding.                           - Await pathology results.                           - Repeat colonoscopy in 3 years for surveillance                            based on pathology results.                           - The findings and recommendations were discussed                            with the patient.                           - The findings and recommendations were discussed                            with the patient's family. Justice Britain, MD 02/07/2018 4:13:50 PM

## 2018-02-07 NOTE — Progress Notes (Signed)
PT taken to PACU. Monitors in place. VSS. Report given to RN. 

## 2018-02-07 NOTE — Progress Notes (Signed)
Pt states he has had no changes in health history since his pre visit.

## 2018-02-08 ENCOUNTER — Telehealth: Payer: Self-pay | Admitting: *Deleted

## 2018-02-08 NOTE — Telephone Encounter (Signed)
Pt wants to talk to the nurse

## 2018-02-08 NOTE — Telephone Encounter (Signed)
Pt calling back need to talk to you again.

## 2018-02-08 NOTE — Telephone Encounter (Signed)
  Follow up Call-  Call back number 02/07/2018  Post procedure Call Back phone  # [phone]  Permission to leave phone message Yes  Some recent data might be hidden     Patient questions:  Do you have a fever, pain , or abdominal swelling? No. Pain Score  0 *  Have you tolerated food without any problems? Yes.    Have you been able to return to your normal activities? Yes.    Do you have any questions about your discharge instructions: Diet   No. Medications  No. Follow up visit  No.  Do you have questions or concerns about your Care? No.  Actions: * If pain score is 4 or above: No action needed, pain <4.

## 2018-02-09 ENCOUNTER — Telehealth: Payer: Self-pay | Admitting: Internal Medicine

## 2018-02-09 NOTE — Telephone Encounter (Signed)
Colon with polypectomy / clips left colon 02-07-18. On Lovenox / coumadin. Clots with BM yesterday and 3 times today. Associated with feces. No other complaints. Observe for now. Given parameters to call back or go to ER. Will forward to Dr. Rush Landmark, his GI MD. He can have his nurse follow up Monday am if no interval patient contact.

## 2018-02-11 ENCOUNTER — Telehealth: Payer: Self-pay | Admitting: *Deleted

## 2018-02-11 ENCOUNTER — Other Ambulatory Visit: Payer: Self-pay

## 2018-02-11 ENCOUNTER — Encounter (HOSPITAL_COMMUNITY)
Admission: EM | Disposition: A | Discharge status: Home / Self Care | Payer: Self-pay | Source: Home / Self Care | Attending: Internal Medicine

## 2018-02-11 ENCOUNTER — Encounter (HOSPITAL_COMMUNITY): Payer: Self-pay

## 2018-02-11 ENCOUNTER — Inpatient Hospital Stay (HOSPITAL_COMMUNITY)
Admission: EM | Admit: 2018-02-11 | Discharge: 2018-02-13 | DRG: 920 | Disposition: A | Discharge status: Home / Self Care | Payer: Medicare Other | Attending: Internal Medicine | Admitting: Internal Medicine

## 2018-02-11 DIAGNOSIS — Y838 Other surgical procedures as the cause of abnormal reaction of the patient, or of later complication, without mention of misadventure at the time of the procedure: Secondary | ICD-10-CM | POA: Diagnosis present

## 2018-02-11 DIAGNOSIS — K91840 Postprocedural hemorrhage and hematoma of a digestive system organ or structure following a digestive system procedure: Secondary | ICD-10-CM | POA: Diagnosis not present

## 2018-02-11 DIAGNOSIS — I110 Hypertensive heart disease with heart failure: Secondary | ICD-10-CM | POA: Diagnosis not present

## 2018-02-11 DIAGNOSIS — Z8249 Family history of ischemic heart disease and other diseases of the circulatory system: Secondary | ICD-10-CM

## 2018-02-11 DIAGNOSIS — K625 Hemorrhage of anus and rectum: Secondary | ICD-10-CM

## 2018-02-11 DIAGNOSIS — K922 Gastrointestinal hemorrhage, unspecified: Secondary | ICD-10-CM

## 2018-02-11 DIAGNOSIS — Z79899 Other long term (current) drug therapy: Secondary | ICD-10-CM

## 2018-02-11 DIAGNOSIS — K633 Ulcer of intestine: Secondary | ICD-10-CM

## 2018-02-11 DIAGNOSIS — Z7901 Long term (current) use of anticoagulants: Secondary | ICD-10-CM | POA: Diagnosis not present

## 2018-02-11 DIAGNOSIS — Z79890 Hormone replacement therapy (postmenopausal): Secondary | ICD-10-CM

## 2018-02-11 DIAGNOSIS — R7301 Impaired fasting glucose: Secondary | ICD-10-CM | POA: Diagnosis present

## 2018-02-11 DIAGNOSIS — Z9049 Acquired absence of other specified parts of digestive tract: Secondary | ICD-10-CM

## 2018-02-11 DIAGNOSIS — I10 Essential (primary) hypertension: Secondary | ICD-10-CM | POA: Diagnosis present

## 2018-02-11 DIAGNOSIS — D62 Acute posthemorrhagic anemia: Secondary | ICD-10-CM | POA: Diagnosis present

## 2018-02-11 DIAGNOSIS — Z885 Allergy status to narcotic agent status: Secondary | ICD-10-CM

## 2018-02-11 DIAGNOSIS — I5022 Chronic systolic (congestive) heart failure: Secondary | ICD-10-CM | POA: Diagnosis not present

## 2018-02-11 DIAGNOSIS — Z9889 Other specified postprocedural states: Secondary | ICD-10-CM

## 2018-02-11 DIAGNOSIS — F419 Anxiety disorder, unspecified: Secondary | ICD-10-CM | POA: Diagnosis present

## 2018-02-11 DIAGNOSIS — K921 Melena: Secondary | ICD-10-CM | POA: Diagnosis present

## 2018-02-11 DIAGNOSIS — Z888 Allergy status to other drugs, medicaments and biological substances status: Secondary | ICD-10-CM

## 2018-02-11 DIAGNOSIS — Z7982 Long term (current) use of aspirin: Secondary | ICD-10-CM

## 2018-02-11 DIAGNOSIS — I428 Other cardiomyopathies: Secondary | ICD-10-CM | POA: Diagnosis present

## 2018-02-11 DIAGNOSIS — Z79891 Long term (current) use of opiate analgesic: Secondary | ICD-10-CM

## 2018-02-11 DIAGNOSIS — E785 Hyperlipidemia, unspecified: Secondary | ICD-10-CM | POA: Diagnosis present

## 2018-02-11 DIAGNOSIS — Z7951 Long term (current) use of inhaled steroids: Secondary | ICD-10-CM

## 2018-02-11 DIAGNOSIS — Z800 Family history of malignant neoplasm of digestive organs: Secondary | ICD-10-CM

## 2018-02-11 DIAGNOSIS — Z952 Presence of prosthetic heart valve: Secondary | ICD-10-CM

## 2018-02-11 HISTORY — PX: SUBMUCOSAL INJECTION: SHX5543

## 2018-02-11 HISTORY — PX: FLEXIBLE SIGMOIDOSCOPY: SHX5431

## 2018-02-11 LAB — CBC WITH DIFFERENTIAL/PLATELET
Abs Immature Granulocytes: 0.03 10*3/uL (ref 0.00–0.07)
Basophils Absolute: 0 10*3/uL (ref 0.0–0.1)
Basophils Relative: 1 %
EOS ABS: 0.1 10*3/uL (ref 0.0–0.5)
EOS PCT: 3 %
HCT: 27.9 % — ABNORMAL LOW (ref 39.0–52.0)
Hemoglobin: 8.7 g/dL — ABNORMAL LOW (ref 13.0–17.0)
Immature Granulocytes: 1 %
Lymphocytes Relative: 13 %
Lymphs Abs: 0.6 10*3/uL — ABNORMAL LOW (ref 0.7–4.0)
MCH: 28.7 pg (ref 26.0–34.0)
MCHC: 31.2 g/dL (ref 30.0–36.0)
MCV: 92.1 fL (ref 80.0–100.0)
Monocytes Absolute: 0.6 10*3/uL (ref 0.1–1.0)
Monocytes Relative: 14 %
NRBC: 0 % (ref 0.0–0.2)
Neutro Abs: 3 10*3/uL (ref 1.7–7.7)
Neutrophils Relative %: 68 %
Platelets: 191 10*3/uL (ref 150–400)
RBC: 3.03 MIL/uL — ABNORMAL LOW (ref 4.22–5.81)
RDW: 13.7 % (ref 11.5–15.5)
WBC: 4.4 10*3/uL (ref 4.0–10.5)

## 2018-02-11 LAB — BASIC METABOLIC PANEL
Anion gap: 9 (ref 5–15)
BUN: 11 mg/dL (ref 8–23)
CALCIUM: 8.5 mg/dL — AB (ref 8.9–10.3)
CO2: 26 mmol/L (ref 22–32)
Chloride: 99 mmol/L (ref 98–111)
Creatinine, Ser: 0.98 mg/dL (ref 0.61–1.24)
GFR calc non Af Amer: >60 mL/min (ref >60)
Glucose, Bld: 118 mg/dL — ABNORMAL HIGH (ref 70–99)
Potassium: 4.2 mmol/L (ref 3.5–5.1)
Sodium: 134 mmol/L — ABNORMAL LOW (ref 135–145)

## 2018-02-11 LAB — PROTIME-INR
INR: 1.46
Prothrombin Time: 17.5 seconds — ABNORMAL HIGH (ref 11.4–15.2)

## 2018-02-11 LAB — PREPARE RBC (CROSSMATCH)

## 2018-02-11 LAB — ABO/RH: ABO/RH(D): O POS

## 2018-02-11 SURGERY — SIGMOIDOSCOPY, FLEXIBLE
Anesthesia: Moderate Sedation

## 2018-02-11 MED ORDER — ONDANSETRON HCL 4 MG/2ML IJ SOLN
4.0000 mg | Freq: Once | INTRAMUSCULAR | Status: AC
  Administered 2018-02-11: 4 mg via INTRAVENOUS
  Filled 2018-02-11: qty 2

## 2018-02-11 MED ORDER — FENTANYL CITRATE (PF) 100 MCG/2ML IJ SOLN
INTRAMUSCULAR | Status: DC | PRN
  Administered 2018-02-11 (×3): 25 ug via INTRAVENOUS

## 2018-02-11 MED ORDER — MIDAZOLAM HCL (PF) 5 MG/ML IJ SOLN
INTRAMUSCULAR | Status: AC
  Filled 2018-02-11: qty 2

## 2018-02-11 MED ORDER — METOPROLOL SUCCINATE ER 50 MG PO TB24
150.0000 mg | ORAL_TABLET | Freq: Every day | ORAL | Status: DC
  Administered 2018-02-11 – 2018-02-12 (×2): 150 mg via ORAL
  Filled 2018-02-11 (×3): qty 3

## 2018-02-11 MED ORDER — SPOT INK MARKER SYRINGE KIT
PACK | SUBMUCOSAL | Status: DC | PRN
  Administered 2018-02-11: 5 mL via SUBMUCOSAL

## 2018-02-11 MED ORDER — ONDANSETRON HCL 4 MG/2ML IJ SOLN: 4.0000 mg | Freq: Four times a day (QID) | INTRAMUSCULAR | Status: DC | PRN

## 2018-02-11 MED ORDER — FENTANYL CITRATE (PF) 100 MCG/2ML IJ SOLN
INTRAMUSCULAR | Status: AC
  Filled 2018-02-11: qty 2

## 2018-02-11 MED ORDER — METHOCARBAMOL 500 MG PO TABS: 500.0000 mg | ORAL_TABLET | Freq: Three times a day (TID) | ORAL | Status: DC | PRN

## 2018-02-11 MED ORDER — EPINEPHRINE PF 1 MG/10ML IJ SOSY
PREFILLED_SYRINGE | INTRAMUSCULAR | Status: AC
  Filled 2018-02-11: qty 10

## 2018-02-11 MED ORDER — PIPERACILLIN-TAZOBACTAM 3.375 G IVPB
3.3750 g | Freq: Three times a day (TID) | INTRAVENOUS | Status: AC
  Administered 2018-02-12 (×2): 3.375 g via INTRAVENOUS
  Filled 2018-02-11 (×3): qty 50

## 2018-02-11 MED ORDER — MOMETASONE FURO-FORMOTEROL FUM 200-5 MCG/ACT IN AERO
2.0000 | INHALATION_SPRAY | Freq: Two times a day (BID) | RESPIRATORY_TRACT | Status: DC
  Administered 2018-02-11 – 2018-02-13 (×4): 2 via RESPIRATORY_TRACT
  Filled 2018-02-11 (×2): qty 8.8

## 2018-02-11 MED ORDER — ASPIRIN EC 81 MG PO TBEC: 81.0000 mg | DELAYED_RELEASE_TABLET | Freq: Every day | ORAL | Status: DC

## 2018-02-11 MED ORDER — SODIUM CHLORIDE 0.9 % IV BOLUS
1000.0000 mL | Freq: Once | INTRAVENOUS | Status: AC
  Administered 2018-02-11: 1000 mL via INTRAVENOUS

## 2018-02-11 MED ORDER — SPOT INK MARKER SYRINGE KIT
PACK | SUBMUCOSAL | Status: AC
  Filled 2018-02-11: qty 5

## 2018-02-11 MED ORDER — ACETAMINOPHEN 325 MG PO TABS: 650.0000 mg | ORAL_TABLET | Freq: Four times a day (QID) | ORAL | Status: DC | PRN

## 2018-02-11 MED ORDER — SODIUM CHLORIDE (PF) 0.9 % IJ SOLN
PREFILLED_SYRINGE | INTRAMUSCULAR | Status: DC | PRN
  Administered 2018-02-11: 3 mL

## 2018-02-11 MED ORDER — ALBUTEROL SULFATE (2.5 MG/3ML) 0.083% IN NEBU: 2.5000 mg | INHALATION_SOLUTION | Freq: Four times a day (QID) | RESPIRATORY_TRACT | Status: DC | PRN

## 2018-02-11 MED ORDER — SODIUM CHLORIDE 0.9 % IV SOLN
INTRAVENOUS | Status: AC | PRN
  Administered 2018-02-11: 500 mL via INTRAVENOUS

## 2018-02-11 MED ORDER — LACTATED RINGERS IV SOLN
INTRAVENOUS | Status: DC
  Administered 2018-02-12 – 2018-02-13 (×2): via INTRAVENOUS

## 2018-02-11 MED ORDER — PEG-KCL-NACL-NASULF-NA ASC-C 100 G PO SOLR
1.0000 | Freq: Once | ORAL | Status: AC
  Administered 2018-02-11: 200 g via ORAL
  Filled 2018-02-11 (×2): qty 1

## 2018-02-11 MED ORDER — ONDANSETRON HCL 4 MG PO TABS: 4.0000 mg | ORAL_TABLET | Freq: Four times a day (QID) | ORAL | Status: DC | PRN

## 2018-02-11 MED ORDER — METOPROLOL SUCCINATE ER 100 MG PO TB24: 100.0000 mg | ORAL_TABLET | Freq: Every day | ORAL | Status: DC

## 2018-02-11 MED ORDER — MIDAZOLAM HCL (PF) 10 MG/2ML IJ SOLN
INTRAMUSCULAR | Status: DC | PRN
  Administered 2018-02-11: 2 mg via INTRAVENOUS
  Administered 2018-02-11 (×3): 1 mg via INTRAVENOUS

## 2018-02-11 MED ORDER — PAROXETINE HCL 20 MG PO TABS
20.0000 mg | ORAL_TABLET | Freq: Every day | ORAL | Status: DC
  Administered 2018-02-12 – 2018-02-13 (×2): 20 mg via ORAL
  Filled 2018-02-11 (×2): qty 1

## 2018-02-11 MED ORDER — ACETAMINOPHEN 650 MG RE SUPP: 650.0000 mg | Freq: Four times a day (QID) | RECTAL | Status: DC | PRN

## 2018-02-11 MED ORDER — FLUTICASONE PROPIONATE 50 MCG/ACT NA SUSP
1.0000 | Freq: Every day | NASAL | Status: DC
  Administered 2018-02-12 – 2018-02-13 (×2): 1 via NASAL
  Filled 2018-02-11: qty 16
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
## 2018-02-11 NOTE — Telephone Encounter (Signed)
Patient called on 02/08/18 and asked to speak with an RN called the patient back and it went to [place] is full. SM

## 2018-02-11 NOTE — Telephone Encounter (Signed)
Attempted to call patient back. No answer, mailbox is full. Unable to leave a message
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
## 2018-02-11 NOTE — Progress Notes (Signed)
Venipuncture performed for istat per Dr. Loletha Carrow

## 2018-02-11 NOTE — ED Triage Notes (Addendum)
Pt arrives to ED with complaints of rectal bleeding for approx 5 days, pt states he just had a colonoscopy and he has been bleeding with BMs since. Pt is on blood thinners: coumadin. Pt states his BM has blood clots, also endorses abdominal pain and nausea. Hx of CHF, denies SOB, dizziness, or chest pain. Pt placed in position of comfort with call bell in reach, bed locked and lowered, MD bedside for evaluation.

## 2018-02-11 NOTE — Telephone Encounter (Signed)
Olivia Mackie, for CQI

## 2018-02-11 NOTE — H&P (Signed)
History and Physical    Corey Mathis DGU:[id number] DOB: 04-19-1950 DOA: 02/11/2018  PCP: [name], MD Consultants:  [facility] - GI; [facility] - orthopedics; [facility] - cardiology Patient coming from:  Home - lives with son; NOK: Wife (but she does not live with him), [phone]  Chief Complaint: bleeding  HPI: Corey Mathis is a 67 y.o. male with medical history significant of mitral valve replacement; HTN;  HLD; and CHF presenting with BRBPR.  He had polypectomy x 2 last Thursday and he has had rectal bleeding since.  When he goes to the toilet, he is passing bright red blood.  He is having clots, as well.  He has had mild fatigue with a bit of nausea.  This AM, he was quite nauseated and diaphoretic.  No SOB other than with significant exertion.  He does not feel like he needs blood at this time.  He is on Coumadin, but was off it from 11/29 until the procedure but he started it back about 7-8pm the night of the scope, 10 mg.  He takes 10, alternating with '15mg'$  to try to get him back to therapeutic.  He was started on Lovenox bridge on Friday, as well.    ED Course: Mechanical valve, on Coumadin.  Had screening c-scope last week.  Ongoing bleeding since polypectomy.  Hgb 8.6.  Not overly symptomatic.  GI to see.  INR is <2.  Review of Systems: As per HPI; otherwise review of systems reviewed and negative.   Ambulatory Status:  Ambulates without assistance  Past Medical History:  Diagnosis Date  . Allergy   . Anxiety   . Asthma   . CHF (congestive heart failure) ([place])   . Dyslipidemia   . History of mitral valve replacement   . Hypertension   . Labyrinthitis    hx    Past Surgical History:  Procedure Laterality Date  . ACNE CYST REMOVAL     from hand and back  . EXTERNAL FIXATION LEG Right 08/11/2017   Procedure: EXTERNAL FIXATION, RIGHT KNEE;  Surgeon: Leandrew Koyanagi, MD;  Location: [facility];  Service: Orthopedics;  Laterality: Right;  . FINE NEEDLE ASPIRATION Right  08/11/2017   Procedure: FINE NEEDLE ASPIRATION  of right Knee with 80 cc of dark red fluid removed;  Surgeon: Leandrew Koyanagi, MD;  Location: [place];  Service: Orthopedics;  Laterality: Right;  . Jaw surgery (other)    . NASAL SINUS SURGERY N/A 10/16/2012   Procedure: NASAL ENDOSCOPIC POLYPECTOMY/MAXILLARY ANTROSTOMY/ETHMOIDECTOMY;  Surgeon: Izora Gala, MD;  Location: [facility];  Service: ENT;  Laterality: N/A;  . ORIF TIBIA PLATEAU Left 08/13/2017   Procedure: OPEN REDUCTION INTERNAL FIXATION (ORIF) TIBIAL PLATEAU;  Surgeon: Leandrew Koyanagi, MD;  Location: [facility];  Service: Orthopedics;  Laterality: Left;  . ORIF TIBIA PLATEAU Right 08/22/2017   Procedure: OPEN REDUCTION INTERNAL FIXATION (ORIF) RIGHT BICONDYLAR TIBIAL PLATEAU, REMOVAL OF EX FIX;  Surgeon: Leandrew Koyanagi, MD;  Location: [facility];  Service: Orthopedics;  Laterality: Right;  . RIGHT/LEFT HEART CATH AND CORONARY ANGIOGRAPHY N/A 07/12/2017   Procedure: RIGHT/LEFT HEART CATH AND CORONARY ANGIOGRAPHY;  Surgeon: Burnell Blanks, MD;  Location: [facility];  Service: Cardiovascular;  Laterality: N/A;  . TOOTH EXTRACTION    . VALVE REPLACEMENT  1998   [facility] [facility], mitral    Social History   Socioeconomic History  . Marital status: Married    Spouse name: Frenchie   . Number of children: 2  .  Years of education: Not on file  . Highest education level: Not on file  Occupational History  . Occupation: retired    Fish farm manager: [employer]  Social Needs  . Financial resource strain: Not on file  . Food insecurity:    Worry: Not on file    Inability: Not on file  . Transportation needs:    Medical: Not on file    Non-medical: Not on file  Tobacco Use  . Smoking status: Never Smoker  . Smokeless tobacco: Never Used  Substance and Sexual Activity  . Alcohol use: Never    Frequency: Never  . Drug use: Never  . Sexual activity: Not Currently  Lifestyle  . Physical activity:    Days per week: Not on file    Minutes per session: Not on  file  . Stress: Not on file  Relationships  . Social connections:    Talks on phone: Not on file    Gets together: Not on file    Attends religious service: Not on file    Active member of club or organization: Not on file    Attends meetings of clubs or organizations: Not on file    Relationship status: Not on file  . Intimate partner violence:    Fear of current or ex partner: Not on file    Emotionally abused: Not on file    Physically abused: Not on file    Forced sexual activity: Not on file  Other Topics Concern  . Not on file  Social History Narrative   ** Merged History Encounter **       Some exercise, bike and walking.  2 cups per day.     Allergies  Allergen Reactions  . Lisinopril Anaphylaxis and Other (See Comments)    angioedema  . Lisinopril Shortness Of Breath  . Carvedilol Other (See Comments)    wheezing  . Codeine Sulfate Nausea Only  . Ezetimibe-Simvastatin Other (See Comments)    Serious vertigo  . Propranolol Hcl Other (See Comments)    wheezing  . Ramipril Other (See Comments)    wheezing  . Codeine Nausea Only    Family History  Problem Relation Age of Onset  . Heart attack Father 65  . Colon cancer Mother 8  . Hypertension Mother   . Stomach cancer Neg Hx     Prior to Admission medications   Medication Sig Start Date End Date Taking? Authorizing Provider  acetaminophen (TYLENOL) 500 MG tablet Take 2 tablets (1,000 mg total) by mouth every 8 (eight) hours. 08/31/17  Yes Meuth, Brooke A, PA-C  albuterol (PROAIR HFA) 108 (90 Base) MCG/ACT inhaler Inhale 2 puffs into the lungs every 6 (six) hours as needed for wheezing. 12/06/16 09/07/24 Yes [name], MD  aspirin 81 MG EC tablet Take 81 mg by mouth daily.    Yes [provider]  cholecalciferol (VITAMIN D) 1000 units tablet Take 1,000 Units by mouth daily.   Yes [provider]  co-enzyme Q-10 30 MG capsule Take 30 mg by mouth daily.   Yes [provider]    enoxaparin (LOVENOX) 80 MG/0.8ML injection Inject 0.8 mLs (80 mg total) into the skin every 12 (twelve) hours. 01/21/18  Yes Crenshaw, Denice Bors, MD  Flax Oil-Fish Oil-Borage Oil (FISH OIL-FLAX OIL-BORAGE OIL) CAPS Take 1 capsule by mouth daily.   Yes [provider]  fluticasone (FLONASE) 50 MCG/ACT nasal spray Place 1 spray into both nostrils daily.   Yes [provider]  Melatonin 3 MG TABS Take 3 mg by mouth at bedtime.   Yes [provider]  methocarbamol (ROBAXIN) 500 MG tablet Take 1 tablet (500 mg total) by mouth every 8 (eight) hours as needed for muscle spasms. 08/31/17  Yes Meuth, Brooke A, PA-C  metoprolol succinate (TOPROL-XL) 100 MG 24 hr tablet Take 100 mg by mouth daily. 11/14/17  Yes [provider]  metoprolol succinate (TOPROL-XL) 50 MG 24 hr tablet TAKE 1 TABLET BY MOUTH ONCE DAILY Patient taking differently: Take 50 mg by mouth daily.  11/14/17  Yes Lelon Perla, MD  mouth rinse LIQD solution 15 mLs by Mouth Rinse route 2 (two) times daily. 08/31/17  Yes Meuth, Brooke A, PA-C  Multiple Vitamin (MULTIVITAMIN) tablet Take 1 tablet by mouth daily.   Yes [provider]  PARoxetine (PAXIL) 20 MG tablet Take 1 tablet (20 mg total) by mouth daily. 11/29/17  Yes [name], MD  SYMBICORT 160-4.5 MCG/ACT inhaler Inhale 1 puff into the lungs 2 (two) times daily. 07/02/17  Yes [provider]  warfarin (COUMADIN) 10 MG tablet Take 1 tablet (10 mg total) by mouth daily. Take As Directed Patient taking differently: Take 10-15 mg by mouth See admin instructions. 15 mg (10 mg x 1.5) every Sun, Wed; 10 mg (10 mg x 1) all other days 11/28/17  Yes Crenshaw, Denice Bors, MD  polyethylene glycol (MIRALAX / GLYCOLAX) packet Take 17 g by mouth 2 (two) times daily. Patient not taking: Reported on 02/11/2018 08/31/17   Margie Billet A, PA-C  SUPREP BOWEL PREP KIT 17.5-3.13-1.6 GM/177ML SOLN Take 1 kit by mouth as directed. For colonoscopy  prep Patient not taking: Reported on 02/11/2018 01/17/18   Zehr, Laban Emperor, PA-C  traZODone (DESYREL) 50 MG tablet Take 0.5-1 tablets (25-50 mg total) by mouth at bedtime as needed for sleep. 01/29/18   [name], MD    Physical Exam: Vitals:   02/11/18 1620 02/11/18 1630 02/11/18 1640 02/11/18 1650  BP: 125/67 132/62 115/62 121/66  Pulse: 63   74  Resp: '15 15 13 17  '$ Temp: 97.6 F (36.4 C)     TempSrc: Oral     SpO2: 96% 100% 100% 98%  Weight:      Height:         General:  Appears calm and comfortable and is NAD; mildly fatigued Eyes:  PERRL, EOMI, normal lids, iris ENT:  grossly normal hearing, lips & tongue, mmm Neck:  no LAD, masses or thyromegaly Cardiovascular:  RRR, no m/r/g. No LE edema.  Respiratory:   CTA bilaterally with no wheezes/rales/rhonchi.  Normal respiratory effort. Abdomen:  soft, NT, ND, NABS Skin:  no rash or induration seen on limited exam Musculoskeletal:  grossly normal tone BUE/BLE, good ROM, no bony abnormality Psychiatric:  grossly normal mood and affect, speech fluent and appropriate, AOx3 Neurologic:  CN 2-12 grossly intact, moves all extremities in coordinated fashion, sensation intact    Radiological Exams on Admission: No results found.  EKG: not done   Labs on Admission: I have personally reviewed the available labs and imaging studies at the time of the admission.  Pertinent labs:   Glucose 118 Hgb 8.7; 15.5 on 8/29 INR 1.46   Assessment/Plan Principal Problem:   Status post colonoscopy with polypectomy Active Problems:   Essential hypertension   MITRAL VALVE REPLACEMENT, HX OF   Current use of long term anticoagulation   IFG (impaired fasting glucose)   Hematochezia   Acute  blood loss anemia   Hematochezia resulting from a post-polypectomy bleed -Patient with colonoscopy with polypectomy x 2 last week -He reports bleeding since the procedure -He was continuing to have ongoing bleeding today -GI has seen the  patient and is planning a bowel prep enema and then flex sig tonight -The procedure was performed and the polypectomy ulcer was visualized with friability and a large clot; epinephrine was injected and the ulceration was closed with 6 hemostatic clips with good result -He will be monitored overnight on med surg and likely discharged tomorrow if he has not further issues  S/p MVR, on Coumadin -His Coumadin was held for the procedure and he was bridged, but it was resumed despite bleeding -Will hold for now -Will cover with heparin dosing per pharmacy, low dose and no bolus -Will resume Lovenox/Coumadin as per GI after d/c  ABLA -Hgb 8.7 on admission, down from 15.5 in August -For now, he is mostly asymptomatic -Will trend CBC q6h and plan to transfuse for Hgb <8  HTN -Continue Toprol XL  Impaired fasting glucose -May be stress response -Will follow with fasting AM labs -It is unlikely that he will need acute or chronic treatment for this issue    DVT prophylaxis: Heparin Code Status:  Full - confirmed with patient/family Family Communication: Wife was present throughout evaluation, but stayed on her phone without really any interaction throughout and so it is not clear how much she absorbed from the visit Disposition Plan:  Home once clinically improved Consults called: GI  Admission status: It is my clinical opinion that referral for OBSERVATION is reasonable and necessary in this patient based on the above information provided. The aforementioned taken together are felt to place the patient at high risk for further clinical deterioration. However it is anticipated that the patient may be medically stable for discharge from the hospital within 24 to 48 hours.     Karmen Bongo MD [facility]  If note is complete, please contact covering daytime or nighttime physician. [email or web address] Password [email or web address]  02/11/2018, 5:25 PM

## 2018-02-11 NOTE — Op Note (Signed)
[facility] Patient Name: Corey Mathis Procedure Date : 02/11/2018 MRN: [id number] Attending MD: Estill Cotta. Loletha Carrow , MD Date of Birth: 02-May-1950 CSN: [id number] Age: 67 Admit Type: Inpatient Procedure:                Flexible Sigmoidoscopy Indications:              Hematochezia (post-polypectomy bleed) Providers:                Estill Cotta. Loletha Carrow, MD, Burtis Junes, RN, William Dalton,                            Technician Referring MD:             Justice Britain, MD Medicines:                Fentanyl 75 micrograms IV, Midazolam 5 mg IV Complications:            No immediate complications. Estimated Blood Loss:     Estimated blood loss: none from interventions.                            There was significant ongoing bleeding during the                            procedure. Procedure:                Pre-Anesthesia Assessment:                           - Prior to the procedure, a History and Physical                            was performed, and patient medications and                            allergies were reviewed. The patient's tolerance of                            previous anesthesia was also reviewed. The risks                            and benefits of the procedure and the sedation                            options and risks were discussed with the patient.                            All questions were answered, and informed consent                            was obtained. Prior Anticoagulants: The patient                            last took Coumadin (warfarin) 1 day and Lovenox                            (  enoxaparin) 1 day prior to the procedure. ASA                            Grade Assessment: III - A patient with severe                            systemic disease. After reviewing the risks and                            benefits, the patient was deemed in satisfactory                            condition to undergo the procedure.  After obtaining informed consent, the scope was                            passed under direct vision. The CF-HQ190L ([id number])                            Olympus adult colon was introduced through the anus                            and advanced to the the left transverse colon. The                            flexible sigmoidoscopy was performed with                            difficulty due to poor endoscopic visualization.                            The patient tolerated the procedure well. The                            quality of the bowel preparation was poor. Scope In: 3:21:11 PM Scope Out: 4:05:57 PM Total Procedure Duration: 0 hours 44 minutes 46 seconds  Findings:      The perianal and digital rectal examinations were normal.      Red blood and stool was found in the entire examined colon. Lavage of       the area was performed using a large amount, resulting in incomplete       clearance with continued poor visualization.      A single (solitary) ulcer/polypectomy site was found in the distal       transverse colon. Oozing was present, and there was also a       (previously-placed) hemoclip and large fibrinous clot over       exposed/edematous and friable submucosal tissue. Area was successfully       injected with 3 mL of a 1:10,000 solution of epinephrine for hemostasis.       Area was tattooed with an injection of 2 mL of Spot (one cc each in two       injections on oppositve walls just distal to the bleeding site). To stop       active bleeding and close the defect, six hemostatic clips were  successfully placed (MR conditional). There was no bleeding at the end       of the procedure. Impression:               - Preparation of the colon was poor.                           - Blood in the entire examined colon.                           - An actively bleeding polypectomy site in the                            distal transverse colon. Injected. Tattooed. Clips                             (MR conditional) were placed. Bleeding controlled.                           - No specimens collected. Moderate Sedation:      Moderate (conscious) sedation was administered by the endoscopy nurse       and supervised by the endoscopist. The following parameters were       monitored: oxygen saturation, heart rate, blood pressure, respiratory       rate, EKG, adequacy of pulmonary ventilation, and response to care.       Total physician intraservice time was 49 minutes. Recommendation:           - Return patient to hospital ward for ongoing care.                           - Transfusion started in endoscopy lab, and a total                            of 2 units PRBCs should be given. Then check                            Hgb/Hct Q 6 hours x 24 hours.                           Do not resume anticoagulation yet.                           Clear liquid diet.                           Zosyn 3.375 grams IV Q 6 hours x 4 dose was ordered                            due to therapeutic endoscopic intervention in                            setting of artifical heart valve. Procedure Code(s):        --- Professional ---  [phone], Sigmoidoscopy, flexible; with control of                            bleeding, any method                           99152, 59, Moderate sedation services provided by                            the same physician or other qualified health care                            professional performing the diagnostic or                            therapeutic service that the sedation supports,                            requiring the presence of an independent trained                            observer to assist in the monitoring of the                            patient's level of consciousness and physiological                            status; initial 15 minutes of intraservice time,                            patient age 37 years or older                            [id number], Moderate sedation; each additional 15                            minutes intraservice time                           99153, Moderate sedation; each additional 15                            minutes intraservice time Diagnosis Code(s):        --- Professional ---                           K92.2, Gastrointestinal hemorrhage, unspecified                           K63.3, Ulcer of intestine                           K92.1, Melena (includes Hematochezia) CPT copyright 2018 American Medical Association. All rights reserved. The codes documented in this report are preliminary and upon coder review may  be revised to meet current compliance requirements. Henry L. Loletha Carrow, MD 02/11/2018  4:37:14 PM This report has been signed electronically. Number of Addenda: 0

## 2018-02-11 NOTE — Telephone Encounter (Signed)
Service message this morning prior to procedures. I called the patient and he described having persistent bleeding Saturday night, Sunday during the day, Sunday night, and is still having bowel movement today with blood clots that are occurring currently. Patient has felt some lightheadedness yesterday but after he ate he was feeling better. He does have some nausea and mild lightheadedness currently. The patient did not call back on Sunday however because he had to go to workshop.  He had 3 bowel movements yesterday each with blood clots and he cannot tell if the stool was brown because it is all bloody. When I start speaking with him he said he had not had a bowel movement but actually during our conversation went to the bathroom and had another passage of blood clots. Still feeling some lightheadedness and some nausea and does not know if nausea from this morning is result of bad chicken he may have eaten yesterday night. I am concerned that he has post polypectomy bleeding even though we ended up closing the defect.  I suspect this is a result of the Lovenox that he was told to restart the day after his procedure. I have asked the patient to come into the ED for further evaluation so that decision can be made whether he will require observation if his INR is supratherapeutic or if he is subtherapeutic the need for heparin and then possibly a flex sig to go after the polypectomy site. I have called the emergency department charge nurse to alert her as well to the patient coming. The patient is going to call his wife to come and pick him up from home and bring him to [facility]. Patient appreciative for the call back this morning hopeful that he does not require another procedure.  Justice Britain, MD [facility] Office # [phone]

## 2018-02-11 NOTE — Interval H&P Note (Signed)
History and Physical Interval Note:  02/11/2018 2:55 PM  Corey Mathis  has presented today for surgery, with the diagnosis of Post polypectomy bleeding.  The various methods of treatment have been discussed with the patient and family. After consideration of risks, benefits and other options for treatment, the patient has consented to  Procedure(s): FLEXIBLE SIGMOIDOSCOPY (N/A) as a surgical intervention .  The patient's history has been reviewed, patient examined, no change in status, stable for surgery.  I have reviewed the patient's chart and labs.  Questions were answered to the patient's satisfaction.     [name]

## 2018-02-12 ENCOUNTER — Encounter: Payer: Self-pay | Admitting: Gastroenterology

## 2018-02-12 DIAGNOSIS — Z79891 Long term (current) use of opiate analgesic: Secondary | ICD-10-CM | POA: Diagnosis not present

## 2018-02-12 DIAGNOSIS — Z7982 Long term (current) use of aspirin: Secondary | ICD-10-CM | POA: Diagnosis not present

## 2018-02-12 DIAGNOSIS — Z952 Presence of prosthetic heart valve: Secondary | ICD-10-CM | POA: Diagnosis not present

## 2018-02-12 DIAGNOSIS — I110 Hypertensive heart disease with heart failure: Secondary | ICD-10-CM | POA: Diagnosis present

## 2018-02-12 DIAGNOSIS — Z8249 Family history of ischemic heart disease and other diseases of the circulatory system: Secondary | ICD-10-CM | POA: Diagnosis not present

## 2018-02-12 DIAGNOSIS — D62 Acute posthemorrhagic anemia: Secondary | ICD-10-CM | POA: Diagnosis not present

## 2018-02-12 DIAGNOSIS — Z7901 Long term (current) use of anticoagulants: Secondary | ICD-10-CM | POA: Diagnosis not present

## 2018-02-12 DIAGNOSIS — I10 Essential (primary) hypertension: Secondary | ICD-10-CM | POA: Diagnosis not present

## 2018-02-12 DIAGNOSIS — Z79899 Other long term (current) drug therapy: Secondary | ICD-10-CM | POA: Diagnosis not present

## 2018-02-12 DIAGNOSIS — R7301 Impaired fasting glucose: Secondary | ICD-10-CM | POA: Diagnosis present

## 2018-02-12 DIAGNOSIS — I428 Other cardiomyopathies: Secondary | ICD-10-CM | POA: Diagnosis present

## 2018-02-12 DIAGNOSIS — Y838 Other surgical procedures as the cause of abnormal reaction of the patient, or of later complication, without mention of misadventure at the time of the procedure: Secondary | ICD-10-CM | POA: Diagnosis present

## 2018-02-12 DIAGNOSIS — Z800 Family history of malignant neoplasm of digestive organs: Secondary | ICD-10-CM | POA: Diagnosis not present

## 2018-02-12 DIAGNOSIS — Z9889 Other specified postprocedural states: Secondary | ICD-10-CM | POA: Diagnosis not present

## 2018-02-12 DIAGNOSIS — Z888 Allergy status to other drugs, medicaments and biological substances status: Secondary | ICD-10-CM | POA: Diagnosis not present

## 2018-02-12 DIAGNOSIS — Z885 Allergy status to narcotic agent status: Secondary | ICD-10-CM | POA: Diagnosis not present

## 2018-02-12 DIAGNOSIS — F419 Anxiety disorder, unspecified: Secondary | ICD-10-CM | POA: Diagnosis present

## 2018-02-12 DIAGNOSIS — Z9049 Acquired absence of other specified parts of digestive tract: Secondary | ICD-10-CM | POA: Diagnosis not present

## 2018-02-12 DIAGNOSIS — K625 Hemorrhage of anus and rectum: Secondary | ICD-10-CM | POA: Diagnosis not present

## 2018-02-12 DIAGNOSIS — Z7951 Long term (current) use of inhaled steroids: Secondary | ICD-10-CM | POA: Diagnosis not present

## 2018-02-12 DIAGNOSIS — E785 Hyperlipidemia, unspecified: Secondary | ICD-10-CM | POA: Diagnosis present

## 2018-02-12 DIAGNOSIS — K921 Melena: Secondary | ICD-10-CM | POA: Diagnosis not present

## 2018-02-12 DIAGNOSIS — I5022 Chronic systolic (congestive) heart failure: Secondary | ICD-10-CM | POA: Diagnosis present

## 2018-02-12 DIAGNOSIS — Z79890 Hormone replacement therapy (postmenopausal): Secondary | ICD-10-CM | POA: Diagnosis not present

## 2018-02-12 DIAGNOSIS — K91840 Postprocedural hemorrhage and hematoma of a digestive system organ or structure following a digestive system procedure: Secondary | ICD-10-CM | POA: Diagnosis present

## 2018-02-12 LAB — POCT I-STAT 4, (NA,K, GLUC, HGB,HCT)
Glucose, Bld: 90 mg/dL (ref 70–99)
HCT: 20 % — ABNORMAL LOW (ref 39.0–52.0)
Hemoglobin: 6.8 g/dL — CL (ref 13.0–17.0)
Potassium: 3.9 mmol/L (ref 3.5–5.1)
Sodium: 138 mmol/L (ref 135–145)

## 2018-02-12 LAB — CBC
HCT: 27 % — ABNORMAL LOW (ref 39.0–52.0)
HCT: 27.6 % — ABNORMAL LOW (ref 39.0–52.0)
HEMATOCRIT: 30 % — AB (ref 39.0–52.0)
Hemoglobin: 8.8 g/dL — ABNORMAL LOW (ref 13.0–17.0)
Hemoglobin: 9 g/dL — ABNORMAL LOW (ref 13.0–17.0)
Hemoglobin: 9.3 g/dL — ABNORMAL LOW (ref 13.0–17.0)
MCH: 28.8 pg (ref 26.0–34.0)
MCH: 29 pg (ref 26.0–34.0)
MCH: 28.2 pg (ref 26.0–34.0)
MCHC: 32.6 g/dL (ref 30.0–36.0)
MCHC: 32.6 g/dL (ref 30.0–36.0)
MCHC: 31 g/dL (ref 30.0–36.0)
MCV: 88.2 fL (ref 80.0–100.0)
MCV: 89 fL (ref 80.0–100.0)
MCV: 90.9 fL (ref 80.0–100.0)
Platelets: 151 10*3/uL (ref 150–400)
Platelets: 156 10*3/uL (ref 150–400)
Platelets: 175 10*3/uL (ref 150–400)
RBC: 3.06 MIL/uL — ABNORMAL LOW (ref 4.22–5.81)
RBC: 3.1 MIL/uL — ABNORMAL LOW (ref 4.22–5.81)
RBC: 3.3 MIL/uL — ABNORMAL LOW (ref 4.22–5.81)
RDW: 14.5 % (ref 11.5–15.5)
RDW: 14.6 % (ref 11.5–15.5)
RDW: 14.6 % (ref 11.5–15.5)
WBC: 5.8 10*3/uL (ref 4.0–10.5)
WBC: 5.6 10*3/uL (ref 4.0–10.5)
WBC: 6.5 10*3/uL (ref 4.0–10.5)
nRBC: 0 % (ref 0.0–0.2)
nRBC: 0 % (ref 0.0–0.2)
nRBC: 0.3 % — ABNORMAL HIGH (ref 0.0–0.2)

## 2018-02-12 LAB — BASIC METABOLIC PANEL
Anion gap: 8 (ref 5–15)
BUN: 6 mg/dL — ABNORMAL LOW (ref 8–23)
CO2: 29 mmol/L (ref 22–32)
Calcium: 8.3 mg/dL — ABNORMAL LOW (ref 8.9–10.3)
Chloride: 104 mmol/L (ref 98–111)
Creatinine, Ser: 0.89 mg/dL (ref 0.61–1.24)
GFR calc Af Amer: >60 mL/min (ref >60)
GFR calc non Af Amer: >60 mL/min (ref >60)
Glucose, Bld: 103 mg/dL — ABNORMAL HIGH (ref 70–99)
Potassium: 3.6 mmol/L (ref 3.5–5.1)
SODIUM: 141 mmol/L (ref 135–145)

## 2018-02-12 LAB — PROTIME-INR
INR: 0.92
PROTHROMBIN TIME: 12.2 s (ref 11.4–15.2)

## 2018-02-12 LAB — HEPARIN LEVEL (UNFRACTIONATED): Heparin Unfractionated: 0.24 IU/mL — ABNORMAL LOW (ref 0.30–0.70)

## 2018-02-12 MED ORDER — ASPIRIN EC 81 MG PO TBEC
81.0000 mg | DELAYED_RELEASE_TABLET | Freq: Every day | ORAL | Status: DC
  Administered 2018-02-12 – 2018-02-13 (×2): 81 mg via ORAL
  Filled 2018-02-12 (×2): qty 1

## 2018-02-12 MED ORDER — HEPARIN (PORCINE) 25000 UT/250ML-% IV SOLN
1350.0000 [IU]/h | INTRAVENOUS | Status: DC
  Administered 2018-02-12: 1200 [IU]/h via INTRAVENOUS
  Administered 2018-02-13: 1350 [IU]/h via INTRAVENOUS
  Filled 2018-02-12 (×3): qty 250

## 2018-02-12 NOTE — Progress Notes (Signed)
ANTICOAGULATION CONSULT NOTE - Initial Consult  Pharmacy Consult for heparin Indication: mechanical valve  Allergies  Allergen Reactions  . Lisinopril Anaphylaxis and Other (See Comments)    angioedema  . Lisinopril Shortness Of Breath  . Carvedilol Other (See Comments)    wheezing  . Codeine Sulfate Nausea Only  . Ezetimibe-Simvastatin Other (See Comments)    Serious vertigo  . Propranolol Hcl Other (See Comments)    wheezing  . Ramipril Other (See Comments)    wheezing  . Codeine Nausea Only    Patient Measurements: Height: 6\' 4"  (193 cm) Weight: 181 lb 14.1 oz (82.5 kg) IBW/kg (Calculated) : 86.8  Vital Signs: Temp: 98.1 F (36.7 C) (12/10 0539) Temp Source: Oral (12/10 0539) BP: 124/73 (12/10 0539) Pulse Rate: 71 (12/10 0539)  Labs: Recent Labs    02/11/18 0942 02/11/18 1540 02/12/18 0634 02/12/18 0948  HGB 8.7* 6.8* 8.8*  --   HCT 27.9* 20.0* 27.0*  --   PLT 191  --  151  --   LABPROT 17.5*  --   --  12.2  INR 1.46  --   --  0.92  CREATININE 0.98  --  0.89  --     Estimated Creatinine Clearance: 91.4 mL/min (by C-G formula based on SCr of 0.89 mg/dL).  Assessment: CC/HPI: 67 yo m presenting with BRBPR s/p polypectomy 1 week prior   PMH: mitral valve replacement, HTN, HLD, CHF  Anticoag: warfarin pta for mitral valve. INR goal 2.5 - 3.5, last dose 12/8 Hep gtt 12/10>> INR 0.92  PTA warfarin 15 mg SuW and 10 mg AOD  Renal: SCr 0.89  Heme/Onc: H&H 8.8/27, Plt 151  Goal of Therapy:  Heparin level 0.3-0.7 units/ml INR goal 2.5 - 3.5 Monitor platelets by anticoagulation protocol: Yes   Plan:  Heparin gtt 1200 units/hr (no bolus d/t recent bleeding) Initial HL 1800 Daily HL CBC F/U restart warfarin possibly wed am  Levester Fresh, PharmD, BCPS, BCCCP Clinical Pharmacist [phone]  Please check AMION for all [place] numbers  02/12/2018 11:08 AM

## 2018-02-12 NOTE — Progress Notes (Addendum)
          Daily Rounding Note  02/12/2018, 9:34 AM  LOS: 0 days   SUBJECTIVE:   Chief complaint: post-polypectomy bleeding.       Pt feels well.  Stool yellow water after drinnking entire movi-prep.   Pt feels well.  Remains on clear fluids.    OBJECTIVE:         Vital signs in last 24 hours:    Temp:  [97.6 F (36.4 C)-98.7 F (37.1 C)] 98.1 F (36.7 C) (12/10 0539) Pulse Rate:  [57-81] 71 (12/10 0539) Resp:  [13-26] 16 (12/10 0539) BP: (72-142)/(49-83) 124/73 (12/10 0539) SpO2:  [96 %-100 %] 98 % (12/10 0913) Last BM Date: 02/11/18 Filed Weights   02/11/18 0926  Weight: 82.5 kg   General: looks great.     Heart: irreg, irreg.  Chest: clear bil.  No labored breathing Abdomen: soft, NT, ND.  Active BS  Extremities: no CCE Neuro/Psych:  Oriented x 3.  Fully alert.  Fluid speech.    Intake/Output from previous day: 12/09 0701 - 12/10 0700 In: 2550 [P.O.:1000; I.V.:100; Blood:400; IV Piggyback:1050] Out: 1600 [Urine:1600]  Intake/Output this shift: No intake/output data recorded.  Lab Results: Recent Labs    02/11/18 0942 02/11/18 1540 02/12/18 0634  WBC 4.4  --  5.8  HGB 8.7* 6.8* 8.8*  HCT 27.9* 20.0* 27.0*  PLT 191  --  151   BMET Recent Labs    02/11/18 0942 02/11/18 1540 02/12/18 0634  NA 134* 138 141  K 4.2 3.9 3.6  CL 99  --  104  CO2 26  --  29  GLUCOSE 118* 90 103*  BUN 11  --  6*  CREATININE 0.98  --  0.89  CALCIUM 8.5*  --  8.3*   PT/INR Recent Labs    02/11/18 0942  LABPROT 17.5*  INR 1.46    Scheduled Meds: . aspirin EC  81 mg Oral Daily  . fluticasone  1 spray Each Nare Daily  . metoprolol succinate  150 mg Oral QHS  . mometasone-formoterol  2 puff Inhalation BID  . PARoxetine  20 mg Oral Daily   Continuous Infusions: . lactated ringers    . piperacillin-tazobactam (ZOSYN)  IV 3.375 g (02/12/18 0355)   PRN Meds:.acetaminophen **OR** acetaminophen, albuterol,  methocarbamol, ondansetron **OR** ondansetron (ZOFRAN) IV   ASSESMENT:   *  Left sided, post-polypectomy bleed.  Colonoscopy was on 12/5.   12/9 flex sig: blood throughout, active bleeding at polypectomy site in distal transverse colon.  Injected, tattooed, multiple endoclips placed Prophylactic Zosyn x 4 doses in process.   Remains on low dose ASA.   Do not see path report of polyp yet.    *   ABL anemia.  S/p PRBC x 2.  Hgb 8.7 >> 6.8 >> 8.8.    *   Chronic Coumadin for prosthetic MVR.  INR subtherapeutic at admission.     PLAN   *   Start unfractionated IV Heparin now.  If no bleeding in next 24 hours,  Plan is to likely restart Coumadin in AM.   Solid diet.  CBC this PM and in AM.              Azucena Freed  02/12/2018, 9:34 AM Phone [phone]

## 2018-02-12 NOTE — Progress Notes (Signed)
[facility]                                                                              Patient Demographics  Corey Mathis, is a 67 y.o. male, DOB - 1950/10/07, [id number]  Admit date - 02/11/2018   Admitting Physician Karmen Bongo, MD  Outpatient Primary MD for the patient is [name], MD  Outpatient specialists:   LOS - 0  days   Medical records reviewed and are as summarized below:    Chief Complaint  Patient presents with  . Rectal Bleeding       Brief summary   Corey Mathis is a 67 y.o. male with medical history significant of mitral valve replacement; HTN;  HLD; and CHF presenting with BRBPR.  He had polypectomy x 2 last Thursday and he has had rectal bleeding since.  When he goes to the toilet, he is passing bright red blood.  He is having clots, as well.  He has had mild fatigue with a bit of nausea.  This AM, he was quite nauseated and diaphoretic.  No SOB other than with significant exertion.  He does not feel like he needs blood at this time.  He is on Coumadin, but was off it from 11/29 until the procedure but he started it back about 7-8pm the night of the scope, 10 mg.  He takes 10, alternating with 15mg  to try to get him back to therapeutic.  He was started on Lovenox bridge on 02/08/2018.   Assessment & Plan    Principal Problem: Lower GI bleed status post colonoscopy with polypectomy -Patient reported bleeding since colonoscopy with polypectomy last week -GI consulted, underwent flex sig on 12/9, single solitary ulcer/polypectomy site was found in the distal transverse colon, with oozing, previous Hemoclip and large fibrinous clot over the exposed edematous and friable submucosal tissue status post appendectomy. -Plan for repeat colonoscopy, continue clears -INR down to 0.9, start heparin drip, continue aspirin for now until heparin drip is started because of mechanical valve - Coumadin on hold, continue serial  CBC  Active Problems:   Essential hypertension -BP currently stable, continue Toprol-XL    MITRAL VALVE REPLACEMENT, HX OF -Coumadin currently on hold, has also been on aspirin, per cardiology notes -Placed on heparin drip, INR has drifted down to 0.9, continue to hold Coumadin for now, will restart once cleared by GI    Acute blood loss anemia -Due to #1, hemoglobin 8.7 on admission, drifted down to 6.8, transfused 2 units packed RBCs, hemoglobin now 8.8 -Follow serial CBC  History of systolic CHF -2D echo 48/1856 had shown EF of 35% to 40% with diffuse hypokinesis -Currently euvolemic, follow I's and O's closely and volume status  Code Status: Full code DVT Prophylaxis: Starting heparin drip Family Communication: Discussed in detail with the patient, all imaging results, lab results explained to the patient    Disposition Plan: Once cleared by GI, colonoscopy today  Time Spent in minutes   35 minutes Procedures:  Flex sig 12/9  Consultants:   GI  Antimicrobials:      Medications  Scheduled Meds: . aspirin EC  81 mg Oral Daily  . fluticasone  1 spray Each Nare Daily  . metoprolol succinate  150 mg Oral QHS  . mometasone-formoterol  2 puff Inhalation BID  . PARoxetine  20 mg Oral Daily   Continuous Infusions: . lactated ringers    . piperacillin-tazobactam (ZOSYN)  IV 3.375 g (02/12/18 0945)   PRN Meds:.acetaminophen **OR** acetaminophen, albuterol, methocarbamol, ondansetron **OR** ondansetron (ZOFRAN) IV   Antibiotics   Anti-infectives (From admission, onward)   Start     Dose/Rate Route Frequency Ordered Stop   02/11/18 1800  piperacillin-tazobactam (ZOSYN) IVPB 3.375 g     3.375 g 12.5 mL/hr over 240 Minutes Intravenous Every 8 hours 02/11/18 1617 02/12/18 1759        Subjective:   Corey Mathis was seen and examined today.  No acute complaints, has finished a colon prep.  No nausea vomiting or abdominal pain.  Patient denies dizziness, chest  pain, shortness of breath,  new weakness, numbess, tingling. No acute events overnight.    Objective:   Vitals:   02/12/18 0130 02/12/18 0355 02/12/18 0539 02/12/18 0913  BP: 112/78 115/77 124/73   Pulse: 78 78 71   Resp: 18 18 16    Temp: 98.7 F (37.1 C) 98.4 F (36.9 C) 98.1 F (36.7 C)   TempSrc: Oral Oral Oral   SpO2: 100% 99% 99% 98%  Weight:      Height:        Intake/Output Summary (Last 24 hours) at 02/12/2018 1043 Last data filed at 02/12/2018 0500 Gross per 24 hour  Intake 1550 ml  Output 1600 ml  Net -50 ml     Wt Readings from Last 3 Encounters:  02/11/18 82.5 kg  02/07/18 82.6 kg  01/29/18 82.6 kg     Exam  General: Alert and oriented x 3, NAD  Eyes:   HEENT:    Cardiovascular: S1 S2 auscultated, + mechanical valve click, RRR  Respiratory: Clear to auscultation bilaterally, no wheezing, rales or rhonchi  Gastrointestinal: Soft, nontender, nondistended, + bowel sounds  Ext: no pedal edema bilaterally  Neuro: No new deficits  Musculoskeletal: No digital cyanosis, clubbing  Skin: No rashes  Psych: Normal affect and demeanor, alert and oriented x3    Data Reviewed:  I have personally reviewed following labs and imaging studies  Micro Results No results found for this or any previous visit (from the past 240 hour(s)).  Radiology Reports No results found.  Lab Data:  CBC: Recent Labs  Lab 02/11/18 0942 02/11/18 1540 02/12/18 0634  WBC 4.4  --  5.8  NEUTROABS 3.0  --   --   HGB 8.7* 6.8* 8.8*  HCT 27.9* 20.0* 27.0*  MCV 92.1  --  88.2  PLT 191  --  973   Basic Metabolic Panel: Recent Labs  Lab 02/11/18 0942 02/11/18 1540 02/12/18 0634  NA 134* 138 141  K 4.2 3.9 3.6  CL 99  --  104  CO2 26  --  29  GLUCOSE 118* 90 103*  BUN 11  --  6*  CREATININE 0.98  --  0.89  CALCIUM 8.5*  --  8.3*   GFR: Estimated Creatinine Clearance: 91.4 mL/min (by C-G formula based on SCr of 0.89 mg/dL). Liver Function Tests: No results  for input(s): AST, ALT, ALKPHOS, BILITOT, PROT, ALBUMIN in the last 168 hours. No results for input(s): LIPASE, AMYLASE in the last 168 hours. No results for input(s): AMMONIA in the last 168 hours. Coagulation Profile: Recent Labs  Lab 02/11/18 0942 02/12/18 0948  INR 1.46 0.92   Cardiac Enzymes: No results for input(s): CKTOTAL, CKMB, CKMBINDEX, TROPONINI in the last 168 hours. BNP (last 3 results) No results for input(s): PROBNP in the last 8760 hours. HbA1C: No results for input(s): HGBA1C in the last 72 hours. CBG: No results for input(s): GLUCAP in the last 168 hours. Lipid Profile: No results for input(s): CHOL, HDL, LDLCALC, TRIG, CHOLHDL, LDLDIRECT in the last 72 hours. Thyroid Function Tests: No results for input(s): TSH, T4TOTAL, FREET4, T3FREE, THYROIDAB in the last 72 hours. Anemia Panel: No results for input(s): VITAMINB12, FOLATE, FERRITIN, TIBC, IRON, RETICCTPCT in the last 72 hours. Urine analysis:    Component Value Date/Time   COLORURINE YELLOW 08/27/2017 2120   APPEARANCEUR TURBID (A) 08/27/2017 2120   LABSPEC 1.010 08/27/2017 2120   PHURINE 7.0 08/27/2017 2120   GLUCOSEU NEGATIVE 08/27/2017 2120   HGBUR MODERATE (A) 08/27/2017 2120   BILIRUBINUR NEGATIVE 08/27/2017 2120   BILIRUBINUR negative 02/13/2011 Corey Mathis 08/27/2017 2120   PROTEINUR 30 (A) 08/27/2017 2120   UROBILINOGEN 0.2 02/13/2011 1527   NITRITE NEGATIVE 08/27/2017 2120   LEUKOCYTESUR LARGE (A) 08/27/2017 2120     Corey Mathis M.D. [facility] 02/12/2018, 10:43 AM  Pager: [phone] Between 7am to 7pm - call Pager - [phone]  After 7pm go to [email or web address] - password [email or web address]  Call night coverage person covering after 7pm

## 2018-02-12 NOTE — Progress Notes (Addendum)
[place] for heparin Indication: mechanical valve  Allergies  Allergen Reactions  . Lisinopril Anaphylaxis and Other (See Comments)    angioedema  . Lisinopril Shortness Of Breath  . Carvedilol Other (See Comments)    wheezing  . Codeine Sulfate Nausea Only  . Ezetimibe-Simvastatin Other (See Comments)    Serious vertigo  . Propranolol Hcl Other (See Comments)    wheezing  . Ramipril Other (See Comments)    wheezing  . Codeine Nausea Only    Patient Measurements: Height: 6\' 4"  (193 cm) Weight: 181 lb 14.1 oz (82.5 kg) IBW/kg (Calculated) : 86.8  Vital Signs: Temp: 98.4 F (36.9 C) (12/10 1703) BP: 111/64 (12/10 1703) Pulse Rate: 73 (12/10 1703)  Labs: Recent Labs    02/11/18 0942  02/12/18 0634 02/12/18 0948 02/12/18 1636 02/12/18 1849  HGB 8.7*   < > 8.8*  --  9.0* 9.3*  HCT 27.9*   < > 27.0*  --  27.6* 30.0*  PLT 191  --  151  --  156 175  LABPROT 17.5*  --   --  12.2  --   --   INR 1.46  --   --  0.92  --   --   HEPARINUNFRC  --   --   --   --   --  0.24*  CREATININE 0.98  --  0.89  --   --   --    < > = values in this interval not displayed.    Estimated Creatinine Clearance: 91.4 mL/min (by C-G formula based on SCr of 0.89 mg/dL).  Assessment: CC/HPI: 67 yo m presenting with BRBPR s/p polypectomy 1 week prior   PMH: mitral valve replacement, HTN, HLD, CHF  Anticoag: warfarin pta for mitral valve. INR goal 2.5 - 3.5, last dose 12/8 Hep gtt 12/10>>  -initial heparin level= 0.24 on 1200 units/hr  Goal of Therapy:  Heparin level 0.3-0.7 units/ml INR goal 2.5 - 3.5 Monitor platelets by anticoagulation protocol: Yes   Plan:  -Increase heparin to 1350 units/hr -Heparin level in 6 hours and daily wth CBC daily  Hildred Laser, PharmD Clinical Pharmacist **Pharmacist phone directory can now be found on [email or web address] ([email or web address]).  Listed under [place].

## 2018-02-13 ENCOUNTER — Other Ambulatory Visit: Payer: Self-pay | Admitting: Physician Assistant

## 2018-02-13 DIAGNOSIS — Z7901 Long term (current) use of anticoagulants: Secondary | ICD-10-CM

## 2018-02-13 DIAGNOSIS — Z9889 Other specified postprocedural states: Secondary | ICD-10-CM

## 2018-02-13 DIAGNOSIS — D62 Acute posthemorrhagic anemia: Secondary | ICD-10-CM

## 2018-02-13 DIAGNOSIS — K921 Melena: Secondary | ICD-10-CM

## 2018-02-13 LAB — HEPARIN LEVEL (UNFRACTIONATED): Heparin Unfractionated: 0.55 IU/mL (ref 0.30–0.70)

## 2018-02-13 LAB — CBC
HCT: 26 % — ABNORMAL LOW (ref 39.0–52.0)
Hemoglobin: 8.5 g/dL — ABNORMAL LOW (ref 13.0–17.0)
MCH: 29.2 pg (ref 26.0–34.0)
MCHC: 32.7 g/dL (ref 30.0–36.0)
MCV: 89.3 fL (ref 80.0–100.0)
Platelets: 149 10*3/uL — ABNORMAL LOW (ref 150–400)
RBC: 2.91 MIL/uL — ABNORMAL LOW (ref 4.22–5.81)
RDW: 14.6 % (ref 11.5–15.5)
WBC: 4.5 10*3/uL (ref 4.0–10.5)
nRBC: 0 % (ref 0.0–0.2)

## 2018-02-13 LAB — BASIC METABOLIC PANEL
Anion gap: 9 (ref 5–15)
BUN: 8 mg/dL (ref 8–23)
CALCIUM: 8.3 mg/dL — AB (ref 8.9–10.3)
CO2: 23 mmol/L (ref 22–32)
Chloride: 106 mmol/L (ref 98–111)
Creatinine, Ser: 0.82 mg/dL (ref 0.61–1.24)
GFR calc Af Amer: >60 mL/min (ref >60)
GFR calc non Af Amer: >60 mL/min (ref >60)
Glucose, Bld: 106 mg/dL — ABNORMAL HIGH (ref 70–99)
Potassium: 3.4 mmol/L — ABNORMAL LOW (ref 3.5–5.1)
Sodium: 138 mmol/L (ref 135–145)

## 2018-02-13 LAB — MRSA PCR SCREENING: MRSA by PCR: POSITIVE — AB

## 2018-02-13 LAB — PROTIME-INR
INR: 1.99
Prothrombin Time: 22.3 seconds — ABNORMAL HIGH (ref 11.4–15.2)

## 2018-02-13 MED ORDER — WARFARIN - PHARMACIST DOSING INPATIENT: Freq: Every day | Status: DC

## 2018-02-13 MED ORDER — MUPIROCIN 2 % EX OINT
1.0000 "application " | TOPICAL_OINTMENT | Freq: Two times a day (BID) | CUTANEOUS | Status: DC
  Filled 2018-02-13: qty 22

## 2018-02-13 MED ORDER — ENOXAPARIN SODIUM 100 MG/ML ~~LOC~~ SOLN
1.0000 mg/kg | Freq: Two times a day (BID) | SUBCUTANEOUS | Status: DC
  Administered 2018-02-13: 85 mg via SUBCUTANEOUS
  Filled 2018-02-13: qty 1

## 2018-02-13 MED ORDER — CHLORHEXIDINE GLUCONATE CLOTH 2 % EX PADS: 6.0000 | MEDICATED_PAD | Freq: Every day | CUTANEOUS | Status: DC

## 2018-02-13 MED ORDER — ENOXAPARIN SODIUM 80 MG/0.8ML ~~LOC~~ SOLN: 80.0000 mg | Freq: Two times a day (BID) | SUBCUTANEOUS | 1 refills | Status: DC

## 2018-02-13 MED ORDER — WARFARIN SODIUM 5 MG PO TABS: 15.0000 mg | ORAL_TABLET | Freq: Once | ORAL | Status: DC

## 2018-02-13 MED ORDER — ENOXAPARIN SODIUM 80 MG/0.8ML ~~LOC~~ SOLN: 80.0000 mg | Freq: Two times a day (BID) | SUBCUTANEOUS | Status: DC

## 2018-02-13 MED ORDER — POTASSIUM CHLORIDE CRYS ER 20 MEQ PO TBCR
40.0000 meq | EXTENDED_RELEASE_TABLET | Freq: Once | ORAL | Status: AC
  Administered 2018-02-13: 40 meq via ORAL
  Filled 2018-02-13: qty 2

## 2018-02-13 NOTE — Progress Notes (Addendum)
Daily Rounding Note  02/13/2018, 10:44 AM  LOS: 1 day   SUBJECTIVE:   Chief complaint:  Post-polypectomy bleed   Stools after moviprep are clear.  No dizziness, weakness, DOE.  ffeels well.  Eating solids.   Restarted on [facility] with IV Heparin 12/10  OBJECTIVE:         Vital signs in last 24 hours:    Temp:  [97.7 F (36.5 C)-98.4 F (36.9 C)] 98.1 F (36.7 C) (12/11 0704) Pulse Rate:  [69-77] 77 (12/11 0946) Resp:  [16-18] 16 (12/11 0946) BP: (111-118)/(64-74) 118/73 (12/11 0704) SpO2:  [98 %-100 %] 100 % (12/11 0946) Last BM Date: 02/12/18 Filed Weights   02/11/18 0926  Weight: 82.5 kg   General: looks great, not ill at all   Heart: RRR Chest: clear bil.   Abdomen: soft, NT, ND.  Active BS  Extremities: no CCE Neuro/Psych: full alert and oriented.  Engaged, calm, cooperative.  Fluid speech.    Intake/Output from previous day: 12/10 0701 - 12/11 0700 In: 1000 [I.V.:1000] Out: 1100 [Urine:1100]  Intake/Output this shift: Total I/O In: 237 [P.O.:237] Out: -   Lab Results: Recent Labs    02/12/18 1636 02/12/18 1849 02/13/18 0137  WBC 5.6 6.5 4.5  HGB 9.0* 9.3* 8.5*  HCT 27.6* 30.0* 26.0*  PLT 156 175 149*   BMET Recent Labs    02/11/18 0942 02/11/18 1540 02/12/18 0634 02/13/18 0137  NA 134* 138 141 138  K 4.2 3.9 3.6 3.4*  CL 99  --  104 106  CO2 26  --  29 23  GLUCOSE 118* 90 103* 106*  BUN 11  --  6* 8  CREATININE 0.98  --  0.89 0.82  CALCIUM 8.5*  --  8.3* 8.3*   LFT No results for input(s): PROT, ALBUMIN, AST, ALT, ALKPHOS, BILITOT, BILIDIR, IBILI in the last 72 hours. PT/INR Recent Labs    02/12/18 0948 02/13/18 0137  LABPROT 12.2 22.3*  INR 0.92 1.99   Scheduled Meds: . aspirin EC  81 mg Oral Daily  . fluticasone  1 spray Each Nare Daily  . metoprolol succinate  150 mg Oral QHS  . mometasone-formoterol  2 puff Inhalation BID  . PARoxetine  20 mg Oral Daily   Continuous  Infusions: . heparin 1,350 Units/hr (02/13/18 0755)  . lactated ringers 100 mL/hr at 02/13/18 1003   PRN Meds:.acetaminophen **OR** acetaminophen, albuterol, methocarbamol, ondansetron **OR** ondansetron (ZOFRAN) IV   ASSESMENT:   *  Left sided, post-polypectomy bleed.  Colonoscopy was on 12/5.   12/9 flex sig: blood throughout, active bleeding at polypectomy site in distal transverse colon.  Injected, tattooed, multiple endoclips placed Prophylactic Zosyn x 4 doses in process.   Remains on low dose ASA.   Do not see path report of polyp yet.    *   ABL anemia.  S/p PRBC x 2.  Hgb 8.7 >> 6.8 >> 8.8 >> 9.3 >> 8.5.      *   Chronic Coumadin for prosthetic MVR.  INR subtherapeutic at admission.  On interval IV Heparin    PLAN   *  Since no further bleeding on heparin, ok to restart Coumadin +/- interval Lovenox until INR therapeutic.  OK to discharge home. Alerted Dr Tana Coast of this  *   GI OV 1/8 at 2:45 PM with Dr Jerilynn Mages.  Ordered CBC, PT and INR at [facility] lab within several days of admission.  Azucena Freed  02/13/2018, 10:44 AM Phone [phone]

## 2018-02-13 NOTE — Progress Notes (Addendum)
ANTICOAGULATION CONSULT NOTE - Initial Consult  Pharmacy Consult for heparin Indication: mechanical valve  Allergies  Allergen Reactions  . Lisinopril Anaphylaxis and Other (See Comments)    angioedema  . Lisinopril Shortness Of Breath  . Carvedilol Other (See Comments)    wheezing  . Codeine Sulfate Nausea Only  . Ezetimibe-Simvastatin Other (See Comments)    Serious vertigo  . Propranolol Hcl Other (See Comments)    wheezing  . Ramipril Other (See Comments)    wheezing  . Codeine Nausea Only    Patient Measurements: Height: 6\' 4"  (193 cm) Weight: 181 lb 14.1 oz (82.5 kg) IBW/kg (Calculated) : 86.8  Vital Signs: Temp: 98.1 F (36.7 C) (12/11 0704) Temp Source: Oral (12/11 0704) BP: 118/73 (12/11 0704) Pulse Rate: 69 (12/11 0704)  Labs: Recent Labs    02/11/18 0942  02/12/18 0634 02/12/18 0948 02/12/18 1636 02/12/18 1849 02/13/18 0137  HGB 8.7*   < > 8.8*  --  9.0* 9.3* 8.5*  HCT 27.9*   < > 27.0*  --  27.6* 30.0* 26.0*  PLT 191  --  151  --  156 175 149*  LABPROT 17.5*  --   --  12.2  --   --  22.3*  INR 1.46  --   --  0.92  --   --  1.99  HEPARINUNFRC  --   --   --   --   --  0.24* 0.55  CREATININE 0.98  --  0.89  --   --   --  0.82   < > = values in this interval not displayed.    Estimated Creatinine Clearance: 99.2 mL/min (by C-G formula based on SCr of 0.82 mg/dL).  Assessment: CC/HPI: 67 yo m presenting with BRBPR s/p polypectomy 1 week prior   PMH: mitral valve replacement, HTN, HLD, CHF  Anticoag: warfarin pta for mitral valve. INR goal 2.5 - 3.5, last dose 12/8 INR 1.99, HL 0.55 - within goal Hep gtt 12/10>>  PTA warfarin 15 mg SuW and 10 mg AOD  Renal: SCr 0.82   Heme/Onc: H&H 8.5/26, Plt 149  Goal of Therapy:  Heparin level 0.3-0.7 units/ml INR goal 2.5 - 3.5 Monitor platelets by anticoagulation protocol: Yes   Plan:  Continue Heparin gtt 1350 units/hr Daily HL CBC F/U restart warfarin possibly wed am  Levester Fresh, PharmD,  BCPS, BCCCP Clinical Pharmacist [phone]  Please check AMION for all [place] numbers  02/13/2018 7:40 AM   ______________________________________________________________________________________ Addendum:  Consult ordered for enoxaparin bridge back to warfarin  Plan: DC Heparin gtt In 1 hr initiate enoxaparin 1 mg/kg q12h Warfarin 15 mg x 1 Daily INR  Levester Fresh, PharmD, BCPS, BCCCP Clinical Pharmacist [phone]  Please check AMION for all [place] numbers  02/13/2018 11:14 AM

## 2018-02-13 NOTE — Progress Notes (Signed)
Corey Mathis to be D/C'd  per MD order. Discussed with the patient and all questions fully answered.  VSS, Skin clean, dry and intact without evidence of skin break down, no evidence of skin tears noted.  IV catheter discontinued intact. Site without signs and symptoms of complications. Dressing and pressure applied.  An After Visit Summary was printed and given to the patient. Patient received prescription.  D/c education completed with patient/family including follow up instructions, medication list, d/c activities limitations if indicated, with other d/c instructions as indicated by MD - patient able to verbalize understanding, all questions fully answered.   Patient instructed to return to ED, call 911, or call MD for any changes in condition.   Patient preferred to walk; wheelchair offered  and D/C home via private auto.

## 2018-02-13 NOTE — Discharge Summary (Signed)
Physician Discharge Summary   Patient ID: Corey Mathis MRN: [id number] DOB/AGE: 69-Apr-1952 69 y.o.  Admit date: 02/11/2018 Discharge date: 02/13/2018  Primary Care Physician:  [name], MD   Recommendations for Outpatient Follow-up:  1. Follow up with PCP in 1-2 weeks 2. Patient recommended to get PT/INR checked on 02/15/2018 on Friday.  Continue Lovenox and Coumadin until INR > 2.5  Home Health: None  Equipment/Devices:   Discharge Condition: stable CODE STATUS: FULL Diet recommendation: Heart healthy diet   Discharge Diagnoses:    . Lower GI bleed status post colonoscopy with polypectomy . Acute blood loss anemia . Essential hypertension . History of mitral valve replacement History of chronic systolic CHF   Consults: Gastroenterology    Allergies:   Allergies  Allergen Reactions  . Lisinopril Anaphylaxis and Other (See Comments)    angioedema  . Lisinopril Shortness Of Breath  . Carvedilol Other (See Comments)    wheezing  . Codeine Sulfate Nausea Only  . Ezetimibe-Simvastatin Other (See Comments)    Serious vertigo  . Propranolol Hcl Other (See Comments)    wheezing  . Ramipril Other (See Comments)    wheezing  . Codeine Nausea Only     DISCHARGE MEDICATIONS: Allergies as of 02/13/2018      Reactions   Lisinopril Anaphylaxis, Other (See Comments)   angioedema   Lisinopril Shortness Of Breath   Carvedilol Other (See Comments)   wheezing   Codeine Sulfate Nausea Only   Ezetimibe-simvastatin Other (See Comments)   Serious vertigo   Propranolol Hcl Other (See Comments)   wheezing   Ramipril Other (See Comments)   wheezing   Codeine Nausea Only      Medication List    TAKE these medications   acetaminophen 500 MG tablet Commonly known as:  TYLENOL Take 2 tablets (1,000 mg total) by mouth every 8 (eight) hours.   albuterol 108 (90 Base) MCG/ACT inhaler Commonly known as:  PROVENTIL HFA;VENTOLIN HFA Inhale 2 puffs into the  lungs every 6 (six) hours as needed for wheezing.   aspirin 81 MG EC tablet Take 81 mg by mouth daily.   cholecalciferol 1000 units tablet Commonly known as:  VITAMIN D Take 1,000 Units by mouth daily.   co-enzyme Q-10 30 MG capsule Take 30 mg by mouth daily.   enoxaparin 80 MG/0.8ML injection Commonly known as:  LOVENOX Inject 0.8 mLs (80 mg total) into the skin every 12 (twelve) hours.   Fish Oil-Flax Oil-Borage Oil Caps Take 1 capsule by mouth daily.   fluticasone 50 MCG/ACT nasal spray Commonly known as:  FLONASE Place 1 spray into both nostrils daily.   Melatonin 3 MG Tabs Take 3 mg by mouth at bedtime.   methocarbamol 500 MG tablet Commonly known as:  ROBAXIN Take 1 tablet (500 mg total) by mouth every 8 (eight) hours as needed for muscle spasms.   metoprolol succinate 50 MG 24 hr tablet Commonly known as:  TOPROL-XL TAKE 1 TABLET BY MOUTH ONCE DAILY   metoprolol succinate 100 MG 24 hr tablet Commonly known as:  TOPROL-XL Take 100 mg by mouth daily.   mouth rinse Liqd solution 15 mLs by Mouth Rinse route 2 (two) times daily.   multivitamin tablet Take 1 tablet by mouth daily.   PARoxetine 20 MG tablet Commonly known as:  PAXIL Take 1 tablet (20 mg total) by mouth daily.   SYMBICORT 160-4.5 MCG/ACT inhaler Generic drug:  budesonide-formoterol Inhale 1 puff into the lungs 2 (two) times  daily.   traZODone 50 MG tablet Commonly known as:  DESYREL Take 0.5-1 tablets (25-50 mg total) by mouth at bedtime as needed for sleep.   warfarin 10 MG tablet Commonly known as:  COUMADIN Take as directed. If you are unsure how to take this medication, talk to your nurse or doctor. Original instructions:  Take 1 tablet (10 mg total) by mouth daily. Take As Directed What changed:    how much to take  when to take this  additional instructions        Brief H and P: For complete details please refer to admission H and P, but in briefWillie H Baileyis a 67  y.o.malewith medical history significant ofmitral valve replacement; HTN; HLD; and CHF presenting with BRBPR. He had polypectomy x 2 last Thursday and he has had rectal bleeding since. When he goes to the toilet, he is passing bright red blood. He is having clots, as well. He has had mild fatigue with a bit of nausea. This AM, he was quite nauseated and diaphoretic. No SOB other than with significant exertion. He does not feel like he needs blood at this time. He is on Coumadin, but was off it from 11/29 until the procedure but he started it back about 7-8pm the night of the scope, 10 mg. He takes 10, alternating with 15mg  to try to get him back to therapeutic. He was started on Lovenox bridge on 02/08/2018.  Hospital Course:  Lower GI bleed status post colonoscopy with polypectomy -Patient reported bleeding since colonoscopy with polypectomy last week -GI consulted, underwent flex sig on 12/9, single solitary ulcer/polypectomy site was found in the distal transverse colon, with oozing, previous Hemoclip and large fibrinous clot over the exposed edematous and friable submucosal tissue status post appendectomy. -Coumadin was held -GI followed the patient closely, does not have any further bleeding now.  Patient was started on IV heparin drip as the INR had trended down to less than 2. -No further repeat bleeding, H&H stable, patient will continue Lovenox bridge with Coumadin until INR 2.5. Hemoglobin 8.5 at the time of discharge.,  Cleared by gastroenterology for discharge home and outpatient follow-up scheduled    Essential hypertension -BP currently stable, continue Toprol-XL    MITRAL VALVE REPLACEMENT, HX OF -Coumadin currently on hold, has also been on aspirin, per cardiology notes -Continue Lovenox bridge with Coumadin until INR 2.5, patient will need to discuss with cardiology if he needs to be on aspirin as well while on Coumadin.    Acute blood loss anemia -Due to #1,  hemoglobin 8.7 on admission, drifted down to 6.8, transfused 2 units packed RBCs, -  H&H currently stable  History of systolic CHF -2D echo 16/1096 had shown EF of 35% to 40% with diffuse hypokinesis -Currently euvolemic   Day of Discharge S: No further bleeding overnight.  BP 118/73 (BP Location: Left Arm)   Pulse 77   Temp 98.1 F (36.7 C) (Oral)   Resp 16   Ht 6\' 4"  (1.93 m)   Wt 82.5 kg   SpO2 100%   BMI 22.14 kg/m   Physical Exam: General: Alert and awake oriented x3 not in any acute distress. HEENT: anicteric sclera, pupils reactive to light and accommodation CVS: S1-S2 clear no murmur rubs or gallops Chest: clear to auscultation bilaterally, no wheezing rales or rhonchi Abdomen: soft nontender, nondistended, normal bowel sounds Extremities: no cyanosis, clubbing or edema noted bilaterally Neuro: Cranial nerves II-XII intact, no focal neurological deficits  The results of significant diagnostics from this hospitalization (including imaging, microbiology, ancillary and laboratory) are listed below for reference.      Procedures/Studies:   No results found.    LAB RESULTS: Basic Metabolic Panel: Recent Labs  Lab 02/12/18 0634 02/13/18 0137  NA 141 138  K 3.6 3.4*  CL 104 106  CO2 29 23  GLUCOSE 103* 106*  BUN 6* 8  CREATININE 0.89 0.82  CALCIUM 8.3* 8.3*   Liver Function Tests: No results for input(s): AST, ALT, ALKPHOS, BILITOT, PROT, ALBUMIN in the last 168 hours. No results for input(s): LIPASE, AMYLASE in the last 168 hours. No results for input(s): AMMONIA in the last 168 hours. CBC: Recent Labs  Lab 02/11/18 0942  02/12/18 1849 02/13/18 0137  WBC 4.4   < > 6.5 4.5  NEUTROABS 3.0  --   --   --   HGB 8.7*   < > 9.3* 8.5*  HCT 27.9*   < > 30.0* 26.0*  MCV 92.1   < > 90.9 89.3  PLT 191   < > 175 149*   < > = values in this interval not displayed.   Cardiac Enzymes: No results for input(s): CKTOTAL, CKMB, CKMBINDEX, TROPONINI in the  last 168 hours. BNP: Invalid input(s): POCBNP CBG: No results for input(s): GLUCAP in the last 168 hours.    Disposition and Follow-up: Discharge Instructions    Diet - low sodium heart healthy   Complete by:  As directed    Discharge instructions   Complete by:  As directed    Please have the PT/ INR checked on Friday 02/15/18.  Continue Lovenox and Coumadin until INR is consistently above 2.5   Increase activity slowly   Complete by:  As directed        DISPOSITION: Home   DISCHARGE FOLLOW-UP Follow-up Information    Mansouraty, Telford Nab., MD Follow up on 03/13/2018.   Specialties:  Gastroenterology, Internal Medicine Why:  2:45 PM follow up of GI bleed.  dont forget to go to the lab in basement for blood work on Monday, Tuesday or AM wednesday before the visit.   Contact information: [place] [address] [phone]        [name], MD. Schedule an appointment as soon as possible for a visit in 2 week(s).   Specialty:  Family Medicine Why:  Please get the PT/INR checked on Friday, 02/15/2018 Contact information: [place] [address] [phone]        Lelon Perla, MD .   Specialty:  Cardiology Contact information: [address] [place] [address] [phone]            Time coordinating discharge:  35-minute  Signed:   Estill Cotta M.D. [facility] 02/13/2018, 1:19 PM Pager: [phone]
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
## 2018-02-14 ENCOUNTER — Telehealth: Payer: Self-pay | Admitting: Cardiology

## 2018-02-14 LAB — TYPE AND SCREEN
ABO/RH(D): O POS
Antibody Screen: NEGATIVE
UNIT DIVISION: 0
Unit division: 0
Unit division: 0
Unit division: 0

## 2018-02-14 LAB — BPAM RBC
BLOOD PRODUCT EXPIRATION DATE: 202001052359
Blood Product Expiration Date: 202001052359
Blood Product Expiration Date: 202001052359
Blood Product Expiration Date: 202001062359
ISSUE DATE / TIME: 201912091550
ISSUE DATE / TIME: 201912100056
Unit Type and Rh: 5100
Unit Type and Rh: 5100
Unit Type and Rh: 5100
Unit Type and Rh: 5100

## 2018-02-14 NOTE — Telephone Encounter (Addendum)
Returned call to the pt. Pt has been in the hospital and states he restarted Coumadin last night. Pt states he took an extra 1/2 tablet last night and will do the same tonight and will resume normal dose tomorrow. Pt aware to continue Lovenox twice a day. Hospital 12/9-12/11GIB, s/p colonoscopy with polypectomy, INR 11/29-1.46, 12/10-0.92, 12/11-1.99, no Coumadin given in the hospital.

## 2018-02-14 NOTE — Telephone Encounter (Signed)
Patient wants to see if he can be worked in to the coumadin clinic today or Friday

## 2018-02-18 ENCOUNTER — Ambulatory Visit (INDEPENDENT_AMBULATORY_CARE_PROVIDER_SITE_OTHER): Payer: Medicare Other | Admitting: *Deleted

## 2018-02-18 DIAGNOSIS — Z9889 Other specified postprocedural states: Secondary | ICD-10-CM | POA: Diagnosis not present

## 2018-02-18 DIAGNOSIS — Z7901 Long term (current) use of anticoagulants: Secondary | ICD-10-CM | POA: Diagnosis not present

## 2018-02-18 DIAGNOSIS — I059 Rheumatic mitral valve disease, unspecified: Secondary | ICD-10-CM | POA: Diagnosis not present

## 2018-02-18 LAB — POCT INR: INR: 2 (ref 2.0–3.0)

## 2018-02-18 MED ORDER — ENOXAPARIN SODIUM 80 MG/0.8ML ~~LOC~~ SOLN: 80.0000 mg | Freq: Two times a day (BID) | SUBCUTANEOUS | 0 refills | Status: DC

## 2018-02-18 NOTE — Patient Instructions (Addendum)
  Description   Take 1.5 tablets tonight, then continue taking 1 tablet daily except 1.5 tablets on Sundays and Wednesdays.  Continue taking Lovenox 80mg  into fatty abdominal tissue every 12 hours, rotate sites. Call us with any new medications or concerns  # [phone]

## 2018-02-20 DIAGNOSIS — M25461 Effusion, right knee: Secondary | ICD-10-CM | POA: Diagnosis not present

## 2018-02-20 DIAGNOSIS — M25661 Stiffness of right knee, not elsewhere classified: Secondary | ICD-10-CM | POA: Diagnosis not present

## 2018-02-20 DIAGNOSIS — M62561 Muscle wasting and atrophy, not elsewhere classified, right lower leg: Secondary | ICD-10-CM | POA: Diagnosis not present

## 2018-02-20 DIAGNOSIS — M25561 Pain in right knee: Secondary | ICD-10-CM | POA: Diagnosis not present

## 2018-02-21 ENCOUNTER — Ambulatory Visit (INDEPENDENT_AMBULATORY_CARE_PROVIDER_SITE_OTHER): Payer: Medicare Other | Admitting: Pharmacist

## 2018-02-21 ENCOUNTER — Ambulatory Visit (INDEPENDENT_AMBULATORY_CARE_PROVIDER_SITE_OTHER): Payer: Medicare Other | Admitting: Orthopaedic Surgery

## 2018-02-21 ENCOUNTER — Ambulatory Visit (INDEPENDENT_AMBULATORY_CARE_PROVIDER_SITE_OTHER): Payer: Medicare Other

## 2018-02-21 DIAGNOSIS — S82142D Displaced bicondylar fracture of left tibia, subsequent encounter for closed fracture with routine healing: Secondary | ICD-10-CM

## 2018-02-21 DIAGNOSIS — Z9889 Other specified postprocedural states: Secondary | ICD-10-CM

## 2018-02-21 DIAGNOSIS — S82141D Displaced bicondylar fracture of right tibia, subsequent encounter for closed fracture with routine healing: Secondary | ICD-10-CM

## 2018-02-21 DIAGNOSIS — Z7901 Long term (current) use of anticoagulants: Secondary | ICD-10-CM

## 2018-02-21 DIAGNOSIS — I059 Rheumatic mitral valve disease, unspecified: Secondary | ICD-10-CM | POA: Diagnosis not present

## 2018-02-21 LAB — POCT INR: INR: 1.9 — AB (ref 2.0–3.0)

## 2018-02-21 MED ORDER — ENOXAPARIN SODIUM 80 MG/0.8ML ~~LOC~~ SOLN: 80.0000 mg | Freq: Two times a day (BID) | SUBCUTANEOUS | 0 refills | Status: DC
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
## 2018-02-21 NOTE — Patient Instructions (Signed)
Description   Take 1.5 tablets tonight, tomorrow, and Saturday, then start taking 1 tablet daily except 1.5 tablets on Mondays, Wednesdays, and Fridays.  Continue taking Lovenox 80mg  into fatty abdominal tissue every 12 hours, rotate sites. Recheck INR on Monday. Call us with any new medications or concerns  # [phone]

## 2018-02-22 ENCOUNTER — Encounter: Payer: Self-pay | Admitting: Cardiology

## 2018-02-22 ENCOUNTER — Ambulatory Visit (INDEPENDENT_AMBULATORY_CARE_PROVIDER_SITE_OTHER): Payer: Medicare Other | Admitting: Cardiology

## 2018-02-22 VITALS — BP 116/70 | HR 66 | Ht 76.0 in | Wt 178.0 lb

## 2018-02-22 DIAGNOSIS — M25661 Stiffness of right knee, not elsewhere classified: Secondary | ICD-10-CM | POA: Diagnosis not present

## 2018-02-22 DIAGNOSIS — M62561 Muscle wasting and atrophy, not elsewhere classified, right lower leg: Secondary | ICD-10-CM | POA: Diagnosis not present

## 2018-02-22 DIAGNOSIS — I428 Other cardiomyopathies: Secondary | ICD-10-CM | POA: Diagnosis not present

## 2018-02-22 DIAGNOSIS — I10 Essential (primary) hypertension: Secondary | ICD-10-CM | POA: Diagnosis not present

## 2018-02-22 DIAGNOSIS — M25461 Effusion, right knee: Secondary | ICD-10-CM | POA: Diagnosis not present

## 2018-02-22 DIAGNOSIS — M25561 Pain in right knee: Secondary | ICD-10-CM | POA: Diagnosis not present

## 2018-02-22 DIAGNOSIS — I059 Rheumatic mitral valve disease, unspecified: Secondary | ICD-10-CM

## 2018-02-22 NOTE — Patient Instructions (Signed)

## 2018-02-25 ENCOUNTER — Emergency Department (INDEPENDENT_AMBULATORY_CARE_PROVIDER_SITE_OTHER)
Admission: EM | Admit: 2018-02-25 | Discharge: 2018-02-25 | Disposition: A | Discharge status: Home / Self Care | Payer: Medicare Other | Source: Home / Self Care

## 2018-02-25 ENCOUNTER — Other Ambulatory Visit: Payer: Self-pay

## 2018-02-25 ENCOUNTER — Ambulatory Visit (INDEPENDENT_AMBULATORY_CARE_PROVIDER_SITE_OTHER): Payer: Medicare Other

## 2018-02-25 ENCOUNTER — Encounter: Payer: Self-pay | Admitting: Emergency Medicine

## 2018-02-25 DIAGNOSIS — J4531 Mild persistent asthma with (acute) exacerbation: Secondary | ICD-10-CM | POA: Diagnosis not present

## 2018-02-25 DIAGNOSIS — S0181XA Laceration without foreign body of other part of head, initial encounter: Secondary | ICD-10-CM | POA: Diagnosis not present

## 2018-02-25 DIAGNOSIS — Z7901 Long term (current) use of anticoagulants: Secondary | ICD-10-CM | POA: Diagnosis not present

## 2018-02-25 DIAGNOSIS — M25561 Pain in right knee: Secondary | ICD-10-CM | POA: Diagnosis not present

## 2018-02-25 DIAGNOSIS — I059 Rheumatic mitral valve disease, unspecified: Secondary | ICD-10-CM

## 2018-02-25 DIAGNOSIS — Z9889 Other specified postprocedural states: Secondary | ICD-10-CM

## 2018-02-25 DIAGNOSIS — M62561 Muscle wasting and atrophy, not elsewhere classified, right lower leg: Secondary | ICD-10-CM | POA: Diagnosis not present

## 2018-02-25 DIAGNOSIS — M25461 Effusion, right knee: Secondary | ICD-10-CM | POA: Diagnosis not present

## 2018-02-25 DIAGNOSIS — M25661 Stiffness of right knee, not elsewhere classified: Secondary | ICD-10-CM | POA: Diagnosis not present

## 2018-02-25 LAB — POCT INR: INR: 2.3 (ref 2.0–3.0)

## 2018-02-25 MED ORDER — BENZONATATE 100 MG PO CAPS: 100.0000 mg | ORAL_CAPSULE | Freq: Three times a day (TID) | ORAL | 0 refills | Status: DC | PRN

## 2018-02-25 MED ORDER — PREDNISONE 20 MG PO TABS: ORAL_TABLET | ORAL | 1 refills | Status: DC

## 2018-02-25 NOTE — ED Triage Notes (Signed)
Patient cut his right above the lip area with razor at around 0900 today; he is on coumadin and it has intermittently been oozing blood.

## 2018-02-25 NOTE — Discharge Instructions (Addendum)
After 3-5 days, you can gently remove the glue from the laceration using petroleum jelly

## 2018-02-25 NOTE — Patient Instructions (Signed)
Description   Take 2 tablets tonight, then continue on same dosage 1 tablet daily except 1.5 tablets on Mondays, Wednesdays, and Fridays.  Continue taking Lovenox 80mg  into fatty abdominal tissue every 12 hours, rotate sites until 02/27/18. Recheck INR in 1 week. Call us with any new medications or concerns  # [phone]
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
## 2018-03-01 DIAGNOSIS — M25461 Effusion, right knee: Secondary | ICD-10-CM | POA: Diagnosis not present

## 2018-03-01 DIAGNOSIS — M25561 Pain in right knee: Secondary | ICD-10-CM | POA: Diagnosis not present

## 2018-03-01 DIAGNOSIS — M25661 Stiffness of right knee, not elsewhere classified: Secondary | ICD-10-CM | POA: Diagnosis not present

## 2018-03-01 DIAGNOSIS — M62561 Muscle wasting and atrophy, not elsewhere classified, right lower leg: Secondary | ICD-10-CM | POA: Diagnosis not present

## 2018-03-08 ENCOUNTER — Other Ambulatory Visit: Payer: Self-pay

## 2018-03-08 ENCOUNTER — Ambulatory Visit (INDEPENDENT_AMBULATORY_CARE_PROVIDER_SITE_OTHER): Payer: Medicare Other | Admitting: Family Medicine

## 2018-03-08 ENCOUNTER — Ambulatory Visit (INDEPENDENT_AMBULATORY_CARE_PROVIDER_SITE_OTHER): Payer: Medicare Other | Admitting: *Deleted

## 2018-03-08 ENCOUNTER — Encounter: Payer: Self-pay | Admitting: Family Medicine

## 2018-03-08 ENCOUNTER — Other Ambulatory Visit (HOSPITAL_COMMUNITY): Payer: Medicare Other

## 2018-03-08 ENCOUNTER — Ambulatory Visit (HOSPITAL_COMMUNITY): Payer: Medicare Other | Attending: Cardiology

## 2018-03-08 VITALS — BP 126/86 | HR 71 | Ht 76.0 in | Wt 183.0 lb

## 2018-03-08 DIAGNOSIS — I10 Essential (primary) hypertension: Secondary | ICD-10-CM

## 2018-03-08 DIAGNOSIS — I059 Rheumatic mitral valve disease, unspecified: Secondary | ICD-10-CM | POA: Diagnosis not present

## 2018-03-08 DIAGNOSIS — K922 Gastrointestinal hemorrhage, unspecified: Secondary | ICD-10-CM | POA: Diagnosis not present

## 2018-03-08 DIAGNOSIS — Z952 Presence of prosthetic heart valve: Secondary | ICD-10-CM | POA: Diagnosis not present

## 2018-03-08 DIAGNOSIS — Z9889 Other specified postprocedural states: Secondary | ICD-10-CM

## 2018-03-08 DIAGNOSIS — Z7901 Long term (current) use of anticoagulants: Secondary | ICD-10-CM | POA: Diagnosis not present

## 2018-03-08 DIAGNOSIS — D62 Acute posthemorrhagic anemia: Secondary | ICD-10-CM

## 2018-03-08 LAB — POCT INR: INR: 2.6 (ref 2.0–3.0)

## 2018-03-08 NOTE — Assessment & Plan Note (Signed)
Well controlled. Continue current regimen. Follow up in  6 mo  

## 2018-03-08 NOTE — Progress Notes (Signed)
Established Patient Office Visit  - Hospital follow-up  Subjective:  Patient ID: Corey Mathis, male    DOB: 1950-03-26  Age: 68 y.o. MRN: [id number]  CC:  Chief Complaint  Patient presents with  . Hospitalization Follow-up    HPI [name] presents for Hospital Follow up.  He was admitted to [facility] on December 9 and discharged home on December 11.  He was admitted for lower GI bleed status post colonoscopy with polyp removal.  He had acute blood loss anemia.  He has a history of mitral valve replacement and heart failure.  He follows at the Coumadin cardiology clinic.  He had had a colonoscopy on December 9.  During hospitalization his Coumadin was held and he was placed on IV heparin.  It was then restarted and he was bridged with Lovenox.  He was anemic with a hemoglobin of 8.5 at the time of discharge.  He has been taking a daily over-the-counter iron supplement.  He has been trying to eat a lot of vegetables and eat a healthy diet.  He has gained a couple pounds back.  He did have an echocardiogram done this afternoon ordered by Dr. [name].  He also had his INR checked earlier today which was 2.6 so he is now able to come off of his Lovenox.  He has not had any more bleeding in his stool or from his bowels.  And he has not had any abdominal pain.  He is doing really well overall.  Past Medical History:  Diagnosis Date  . Allergy   . Anxiety   . Asthma   . CHF (congestive heart failure) ([place])   . Dyslipidemia   . History of mitral valve replacement   . Hypertension   . Labyrinthitis    hx    Past Surgical History:  Procedure Laterality Date  . ACNE CYST REMOVAL     from hand and back  . EXTERNAL FIXATION LEG Right 08/11/2017   Procedure: EXTERNAL FIXATION, RIGHT KNEE;  Surgeon: Leandrew Koyanagi, MD;  Location: [facility];  Service: Orthopedics;  Laterality: Right;  . FINE NEEDLE ASPIRATION Right 08/11/2017   Procedure: FINE NEEDLE ASPIRATION  of right Knee with 80 cc of  dark red fluid removed;  Surgeon: Leandrew Koyanagi, MD;  Location: [facility];  Service: Orthopedics;  Laterality: Right;  . FLEXIBLE SIGMOIDOSCOPY N/A 02/11/2018   Procedure: FLEXIBLE SIGMOIDOSCOPY;  Surgeon: Doran Stabler, MD;  Location: [facility];  Service: Gastroenterology;  Laterality: N/A;  . Jaw surgery (other)    . NASAL SINUS SURGERY N/A 10/16/2012   Procedure: NASAL ENDOSCOPIC POLYPECTOMY/MAXILLARY ANTROSTOMY/ETHMOIDECTOMY;  Surgeon: Izora Gala, MD;  Location: [place];  Service: ENT;  Laterality: N/A;  . ORIF TIBIA PLATEAU Left 08/13/2017   Procedure: OPEN REDUCTION INTERNAL FIXATION (ORIF) TIBIAL PLATEAU;  Surgeon: Leandrew Koyanagi, MD;  Location: [place];  Service: Orthopedics;  Laterality: Left;  . ORIF TIBIA PLATEAU Right 08/22/2017   Procedure: OPEN REDUCTION INTERNAL FIXATION (ORIF) RIGHT BICONDYLAR TIBIAL PLATEAU, REMOVAL OF EX FIX;  Surgeon: Leandrew Koyanagi, MD;  Location: [place];  Service: Orthopedics;  Laterality: Right;  . RIGHT/LEFT HEART CATH AND CORONARY ANGIOGRAPHY N/A 07/12/2017   Procedure: RIGHT/LEFT HEART CATH AND CORONARY ANGIOGRAPHY;  Surgeon: Burnell Blanks, MD;  Location: [facility] [place] [facility];  Service: Cardiovascular;  Laterality: N/A;  . SUBMUCOSAL INJECTION  02/11/2018   Procedure: SUBMUCOSAL INJECTION;  Surgeon: Doran Stabler, MD;  Location: [place];  Service: Gastroenterology;;  . TOOTH EXTRACTION    . VALVE REPLACEMENT  1998   St. Jude, mitral    Family History  Problem Relation Age of Onset  . Heart attack Father 106  . Colon cancer Mother 59  . Hypertension Mother   . Stomach cancer Neg Hx     Social History   Socioeconomic History  . Marital status: Married    Spouse name: Frenchie   . Number of children: 2  . Years of education: Not on file  . Highest education level: Not on file  Occupational History  . Occupation: retired    Fish farm manager: [employer]  Social Needs  . Financial resource strain: Not on file  . Food insecurity:    Worry:  Not on file    Inability: Not on file  . Transportation needs:    Medical: Not on file    Non-medical: Not on file  Tobacco Use  . Smoking status: Never Smoker  . Smokeless tobacco: Never Used  Substance and Sexual Activity  . Alcohol use: Never    Frequency: Never  . Drug use: Never  . Sexual activity: Not Currently  Lifestyle  . Physical activity:    Days per week: Not on file    Minutes per session: Not on file  . Stress: Not on file  Relationships  . Social connections:    Talks on phone: Not on file    Gets together: Not on file    Attends religious service: Not on file    Active member of club or organization: Not on file    Attends meetings of clubs or organizations: Not on file    Relationship status: Not on file  . Intimate partner violence:    Fear of current or ex partner: Not on file    Emotionally abused: Not on file    Physically abused: Not on file    Forced sexual activity: Not on file  Other Topics Concern  . Not on file  Social History Narrative   ** Merged History Encounter **       Some exercise, bike and walking.  2 cups per day.     Outpatient Medications Prior to Visit  Medication Sig Dispense Refill  . acetaminophen (TYLENOL) 500 MG tablet Take 2 tablets (1,000 mg total) by mouth every 8 (eight) hours. 30 tablet 0  . albuterol (PROAIR HFA) 108 (90 Base) MCG/ACT inhaler Inhale 2 puffs into the lungs every 6 (six) hours as needed for wheezing. 3 Inhaler 2  . aspirin 81 MG EC tablet Take 81 mg by mouth daily.     . benzonatate (TESSALON) 100 MG capsule Take 1-2 capsules (100-200 mg total) by mouth 3 (three) times daily as needed for cough. 40 capsule 0  . cholecalciferol (VITAMIN D) 1000 units tablet Take 1,000 Units by mouth daily.    Marland Kitchen co-enzyme Q-10 30 MG capsule Take 30 mg by mouth daily.    . Flax Oil-Fish Oil-Borage Oil (FISH OIL-FLAX OIL-BORAGE OIL) CAPS Take 1 capsule by mouth daily.    . fluticasone (FLONASE) 50 MCG/ACT nasal spray Place 1  spray into both nostrils daily.    . Melatonin 3 MG TABS Take 3 mg by mouth at bedtime.    . methocarbamol (ROBAXIN) 500 MG tablet Take 1 tablet (500 mg total) by mouth every 8 (eight) hours as needed for muscle spasms.    . metoprolol succinate (TOPROL-XL) 100 MG 24 hr tablet Take 100 mg by mouth  daily.  3  . metoprolol succinate (TOPROL-XL) 50 MG 24 hr tablet TAKE 1 TABLET BY MOUTH ONCE DAILY (Patient taking differently: Take 50 mg by mouth daily. ) 90 tablet 1  . mouth rinse LIQD solution 15 mLs by Mouth Rinse route 2 (two) times daily.  0  . Multiple Vitamin (MULTIVITAMIN) tablet Take 1 tablet by mouth daily.    Marland Kitchen PARoxetine (PAXIL) 20 MG tablet Take 1 tablet (20 mg total) by mouth daily. 90 tablet 1  . SYMBICORT 160-4.5 MCG/ACT inhaler Inhale 1 puff into the lungs 2 (two) times daily.  2  . traZODone (DESYREL) 50 MG tablet Take 0.5-1 tablets (25-50 mg total) by mouth at bedtime as needed for sleep. 30 tablet 3  . warfarin (COUMADIN) 10 MG tablet Take 1 tablet (10 mg total) by mouth daily. Take As Directed (Patient taking differently: Take 10-15 mg by mouth See admin instructions. 15 mg (10 mg x 1.5) every Sun, Wed; 10 mg (10 mg x 1) all other days) 120 tablet 1  . enoxaparin (LOVENOX) 80 MG/0.8ML injection Inject 0.8 mLs (80 mg total) into the skin every 12 (twelve) hours. 10 Syringe 0  . predniSONE (DELTASONE) 20 MG tablet 2 daily with food 10 tablet 1   No facility-administered medications prior to visit.     Allergies  Allergen Reactions  . Lisinopril Anaphylaxis and Other (See Comments)    angioedema  . Lisinopril Shortness Of Breath  . Carvedilol Other (See Comments)    wheezing  . Codeine Sulfate Nausea Only  . Ezetimibe-Simvastatin Other (See Comments)    Serious vertigo  . Propranolol Hcl Other (See Comments)    wheezing  . Ramipril Other (See Comments)    wheezing  . Codeine Nausea Only    ROS Review of Systems    Objective:    Physical Exam  BP 126/86   Pulse  71   Ht 6\' 4"  (1.93 m)   Wt 183 lb (83 kg)   SpO2 98%   BMI 22.28 kg/m  Wt Readings from Last 3 Encounters:  03/08/18 183 lb (83 kg)  02/25/18 177 lb 14.6 oz (80.7 kg)  02/22/18 178 lb (80.7 kg)     There are no preventive care reminders to display for this patient.  There are no preventive care reminders to display for this patient.  Lab Results  Component Value Date   TSH 1.14 07/27/2016   Lab Results  Component Value Date   WBC 4.5 02/13/2018   HGB 8.5 (L) 02/13/2018   HCT 26.0 (L) 02/13/2018   MCV 89.3 02/13/2018   PLT 149 (L) 02/13/2018   Lab Results  Component Value Date   NA 138 02/13/2018   K 3.4 (L) 02/13/2018   CO2 23 02/13/2018   GLUCOSE 106 (H) 02/13/2018   BUN 8 02/13/2018   CREATININE 0.82 02/13/2018   BILITOT 0.7 11/01/2017   ALKPHOS 72 08/10/2017   AST 23 11/01/2017   ALT 11 11/01/2017   PROT 7.9 11/01/2017   ALBUMIN 3.9 08/10/2017   CALCIUM 8.3 (L) 02/13/2018   ANIONGAP 9 02/13/2018   GFR 96.14 05/28/2014   Lab Results  Component Value Date   CHOL 206 (H) 11/01/2017   Lab Results  Component Value Date   HDL 41 11/01/2017   Lab Results  Component Value Date   LDLCALC 138 (H) 11/01/2017   Lab Results  Component Value Date   TRIG 147 11/01/2017   Lab Results  Component Value Date  CHOLHDL 5.0 (H) 11/01/2017   Lab Results  Component Value Date   HGBA1C 5.3 11/01/2017      Assessment & Plan:   Problem List Items Addressed This Visit      Cardiovascular and Mediastinum   Essential hypertension    Well controlled. Continue current regimen. Follow up in  6 mo        Other   H/O mitral valve replacement with mechanical valve   Acute blood loss anemia   Relevant Orders   CBC   Fe+TIBC+Fer    Other Visit Diagnoses    Lower GI bleed    -  Primary   Relevant Orders   CBC   Fe+TIBC+Fer     Lower GI bleed status post colonoscopy with polypectomy -he is doing much better and is asymptomatic.  It seems to have resolved.   He want to go back in 3 years for repeat colonoscopy on the pathology of the polyps.  Acute blood loss anemia -hemoglobin at discharge from hospital was 8.5.  We will recheck that today.  He has actually been taking a daily iron supplement.  Finished up his PT for his knee.    Mechanical valve replacement-on Coumadin now off the Lovenox and he is therapeutic.  Following with the Coumadin clinic at cardiology.  No orders of the defined types were placed in this encounter.   Follow-up: No follow-ups on file.    Beatrice Lecher, MD

## 2018-03-08 NOTE — Patient Instructions (Signed)
Description   Continue on same dosage 1 tablet daily except 1.5 tablets on Mondays, Wednesdays, and Fridays.  Recheck INR in 2 weeks. Call us with any new medications or concerns  # [phone]

## 2018-03-09 LAB — CBC
HCT: 38.3 % — ABNORMAL LOW (ref 38.5–50.0)
Hemoglobin: 12.4 g/dL — ABNORMAL LOW (ref 13.2–17.1)
MCH: 28.9 pg (ref 27.0–33.0)
MCHC: 32.4 g/dL (ref 32.0–36.0)
MCV: 89.3 fL (ref 80.0–100.0)
MPV: 10.7 fL (ref 7.5–12.5)
Platelets: 255 10*3/uL (ref 140–400)
RBC: 4.29 10*6/uL (ref 4.20–5.80)
RDW: 14.2 % (ref 11.0–15.0)
WBC: 5.5 10*3/uL (ref 3.8–10.8)

## 2018-03-09 LAB — IRON,TIBC AND FERRITIN PANEL
%SAT: 21 % (calc) (ref 20–48)
FERRITIN: 83 ng/mL (ref 24–380)
Iron: 64 ug/dL (ref 50–180)
TIBC: 309 mcg/dL (calc) (ref 250–425)

## 2018-03-13 ENCOUNTER — Encounter: Payer: Self-pay | Admitting: Gastroenterology

## 2018-03-13 ENCOUNTER — Ambulatory Visit (INDEPENDENT_AMBULATORY_CARE_PROVIDER_SITE_OTHER): Payer: Medicare Other | Admitting: Gastroenterology

## 2018-03-13 VITALS — BP 120/70 | HR 64 | Ht 74.75 in | Wt 182.1 lb

## 2018-03-13 DIAGNOSIS — Z7901 Long term (current) use of anticoagulants: Secondary | ICD-10-CM | POA: Diagnosis not present

## 2018-03-13 DIAGNOSIS — Z9889 Other specified postprocedural states: Secondary | ICD-10-CM

## 2018-03-13 DIAGNOSIS — Z86010 Personal history of colonic polyps: Secondary | ICD-10-CM

## 2018-03-13 DIAGNOSIS — D509 Iron deficiency anemia, unspecified: Secondary | ICD-10-CM | POA: Diagnosis not present

## 2018-03-13 DIAGNOSIS — K91840 Postprocedural hemorrhage and hematoma of a digestive system organ or structure following a digestive system procedure: Secondary | ICD-10-CM

## 2018-03-13 DIAGNOSIS — D126 Benign neoplasm of colon, unspecified: Secondary | ICD-10-CM | POA: Diagnosis not present

## 2018-03-13 NOTE — Progress Notes (Signed)
[place] VISIT   Primary Care Provider [name], [place] [facility] [address] [place] [address] [phone]  Referring Provider [name], [place], [place] [address] [phone]  Patient Profile: Corey Mathis is a 68 y.o. 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  The patient presents to the [facility] for an evaluation and management of problem(s) noted below:  Problem List 1. Iron deficiency anemia, unspecified iron deficiency anemia type   2. History of colonic polyps   3. Tubular adenoma of colon   4. Anticoagulated on Coumadin   5. Status post colonoscopy with polypectomy   6. Colonoscopy causing post-procedural bleeding     History of Present Illness: This is the patient's first outpatient visit to the [place] [facility] since his hospitalization.  The patient was initially seen on 5 December in the setting of an outpatient colonoscopy.  At the time of his outpatient colonoscopy I found a 13 mm polyp in the descending colon and a 2 mm polyp in the rectosigmoid.  A 13 mm polyp was mucosally resected and 2 clips were placed in effort of trying to decrease risk of bleeding.  2 mm polyp was cold snared.  He was also found to have internal hemorrhoids.  The patient went back on his Lovenox bridging as had been determined by the anticoagulation clinic.  Unfortunately he then developed bleeding.  He came in for further evaluation and underwent a flexible sigmoidoscopy on 9 December.  A single ulcer from prior polypectomy site was found.  Oozing was present in the hemoclips were still present with a clot also present.  The area was injected with epinephrine and 6 clips were placed further to try and close the defect.  The patient was eventually discharged after stability.  He was given IV iron while inpatient.  He is  done subsequently very well.  Last week he ended up having a repeat blood count found to have a hemogram increase up to 12.4.  Has no microcytosis.  The patient describes 1 episode of bright red blood per rectum while wiping which he felt was likely hemorrhoidal since his discharge but no subsequent return or repeat episodes.  He is currently taking iron.  He otherwise has no significant GI complaints  GI Review of Systems Positive as above Negative for pyrosis, dysphagia, odynophagia, change in appetite, abdominal pain, melena, changes in bowel habits  Review of Systems General: Denies fevers/chills/weight loss HEENT: Denies oral lesions Cardiovascular: Denies chest pain/palpitations Pulmonary: Denies shortness of breath Gastroenterological: See HPI Genitourinary: Denies darkened urine or hematuria Hematological: Denies easy bruising/bleeding Dermatological: Denies jaundice Psychological: Mood is stable   Medications Current Outpatient Medications  Medication Sig Dispense Refill  . acetaminophen (TYLENOL) 500 MG tablet Take 2 tablets (1,000 mg total) by mouth every 8 (eight) hours. 30 tablet 0  . albuterol (PROAIR HFA) 108 (90 Base) MCG/ACT inhaler Inhale 2 puffs into the lungs every 6 (six) hours as needed for wheezing. 3 Inhaler 2  . aspirin 81 MG EC tablet Take 81 mg by mouth daily.     . benzonatate (TESSALON) 100 MG capsule Take 1-2 capsules (100-200 mg total) by mouth 3 (three) times daily as needed for cough. 40 capsule 0  . cholecalciferol (VITAMIN D) 1000 units tablet Take 1,000 Units by mouth daily.    Marland Kitchen co-enzyme Q-10 30 MG capsule  Take 30 mg by mouth daily.    . Flax Oil-Fish Oil-Borage Oil (FISH OIL-FLAX OIL-BORAGE OIL) CAPS Take 1 capsule by mouth daily.    . fluticasone (FLONASE) 50 MCG/ACT nasal spray Place 1 spray into both nostrils daily.    . Melatonin 3 MG TABS Take 3 mg by mouth at bedtime.    . methocarbamol (ROBAXIN) 500 MG tablet Take 1 tablet (500 mg total) by  mouth every 8 (eight) hours as needed for muscle spasms.    . metoprolol succinate (TOPROL-XL) 100 MG 24 hr tablet Take 100 mg by mouth daily.  3  . metoprolol succinate (TOPROL-XL) 50 MG 24 hr tablet TAKE 1 TABLET BY MOUTH ONCE DAILY (Patient taking differently: Take 50 mg by mouth daily. ) 90 tablet 1  . mouth rinse LIQD solution 15 mLs by Mouth Rinse route 2 (two) times daily.  0  . Multiple Vitamin (MULTIVITAMIN) tablet Take 1 tablet by mouth daily.    Marland Kitchen PARoxetine (PAXIL) 20 MG tablet Take 1 tablet (20 mg total) by mouth daily. 90 tablet 1  . SYMBICORT 160-4.5 MCG/ACT inhaler Inhale 1 puff into the lungs 2 (two) times daily.  2  . traZODone (DESYREL) 50 MG tablet Take 0.5-1 tablets (25-50 mg total) by mouth at bedtime as needed for sleep. 30 tablet 3  . warfarin (COUMADIN) 10 MG tablet Take 1 tablet (10 mg total) by mouth daily. Take As Directed (Patient taking differently: Take 10-15 mg by mouth See admin instructions. 15 mg (10 mg x 1.5) every Sun, Wed; 10 mg (10 mg x 1) all other days) 120 tablet 1   No current facility-administered medications for this visit.     Allergies Allergies  Allergen Reactions  . Lisinopril Anaphylaxis and Other (See Comments)    angioedema  . Lisinopril Shortness Of Breath  . Carvedilol Other (See Comments)    wheezing  . Codeine Sulfate Nausea Only  . Ezetimibe-Simvastatin Other (See Comments)    Serious vertigo  . Propranolol Hcl Other (See Comments)    wheezing  . Ramipril Other (See Comments)    wheezing  . Codeine Nausea Only    Histories Past Medical History:  Diagnosis Date  . Allergy   . Anxiety   . Asthma   . CHF (congestive heart failure) ([place])   . Dyslipidemia   . History of mitral valve replacement   . Hypertension   . Labyrinthitis    hx   Past Surgical History:  Procedure Laterality Date  . ACNE CYST REMOVAL     from hand and back  . EXTERNAL FIXATION LEG Right 08/11/2017   Procedure: EXTERNAL FIXATION, RIGHT KNEE;   Surgeon: Leandrew Koyanagi, MD;  Location: [facility];  Service: Orthopedics;  Laterality: Right;  . FINE NEEDLE ASPIRATION Right 08/11/2017   Procedure: FINE NEEDLE ASPIRATION  of right Knee with 80 cc of dark red fluid removed;  Surgeon: Leandrew Koyanagi, MD;  Location: [facility];  Service: Orthopedics;  Laterality: Right;  . FLEXIBLE SIGMOIDOSCOPY N/A 02/11/2018   Procedure: FLEXIBLE SIGMOIDOSCOPY;  Surgeon: Doran Stabler, MD;  Location: [facility];  Service: Gastroenterology;  Laterality: N/A;  . Jaw surgery (other)    . NASAL SINUS SURGERY N/A 10/16/2012   Procedure: NASAL ENDOSCOPIC POLYPECTOMY/MAXILLARY ANTROSTOMY/ETHMOIDECTOMY;  Surgeon: Izora Gala, MD;  Location: [facility];  Service: ENT;  Laterality: N/A;  . ORIF TIBIA PLATEAU Left 08/13/2017   Procedure: OPEN REDUCTION INTERNAL FIXATION (ORIF) TIBIAL PLATEAU;  Surgeon: Leandrew Koyanagi,  MD;  Location: [facility];  Service: Orthopedics;  Laterality: Left;  . ORIF TIBIA PLATEAU Right 08/22/2017   Procedure: OPEN REDUCTION INTERNAL FIXATION (ORIF) RIGHT BICONDYLAR TIBIAL PLATEAU, REMOVAL OF EX FIX;  Surgeon: Leandrew Koyanagi, MD;  Location: [facility];  Service: Orthopedics;  Laterality: Right;  . RIGHT/LEFT HEART CATH AND CORONARY ANGIOGRAPHY N/A 07/12/2017   Procedure: RIGHT/LEFT HEART CATH AND CORONARY ANGIOGRAPHY;  Surgeon: Burnell Blanks, MD;  Location: [facility];  Service: Cardiovascular;  Laterality: N/A;  . SUBMUCOSAL INJECTION  02/11/2018   Procedure: SUBMUCOSAL INJECTION;  Surgeon: Doran Stabler, MD;  Location: [facility];  Service: Gastroenterology;;  . TOOTH EXTRACTION    . VALVE REPLACEMENT  1998   [employer], mitral   Social History   Socioeconomic History  . Marital status: Married    Spouse name: Frenchie   . Number of children: 2  . Years of education: Not on file  . Highest education level: Not on file  Occupational History  . Occupation: retired    Fish farm manager: [employer]  Social Needs  . Financial resource strain: Not on  file  . Food insecurity:    Worry: Not on file    Inability: Not on file  . Transportation needs:    Medical: Not on file    Non-medical: Not on file  Tobacco Use  . Smoking status: Never Smoker  . Smokeless tobacco: Never Used  Substance and Sexual Activity  . Alcohol use: Never    Frequency: Never  . Drug use: Never  . Sexual activity: Not Currently  Lifestyle  . Physical activity:    Days per week: Not on file    Minutes per session: Not on file  . Stress: Not on file  Relationships  . Social connections:    Talks on phone: Not on file    Gets together: Not on file    Attends religious service: Not on file    Active member of club or organization: Not on file    Attends meetings of clubs or organizations: Not on file    Relationship status: Not on file  . Intimate partner violence:    Fear of current or ex partner: Not on file    Emotionally abused: Not on file    Physically abused: Not on file    Forced sexual activity: Not on file  Other Topics Concern  . Not on file  Social History Narrative   ** Merged History Encounter **       Some exercise, bike and walking.  2 cups per day.    Family History  Problem Relation Age of Onset  . Heart attack Father 90  . Colon cancer Mother 73  . Hypertension Mother   . Stomach cancer Neg Hx   . Esophageal cancer Neg Hx   . Inflammatory bowel disease Neg Hx   . Liver disease Neg Hx   . Pancreatic cancer Neg Hx    I have reviewed his medical, social, and family history in detail and updated the electronic medical record as necessary.    PHYSICAL EXAMINATION  BP 120/70 (BP Location: Left Arm, Patient Position: Sitting, Cuff Size: Normal)   Pulse 64   Ht 6' 2.75" (1.899 m) Comment: height measured without shoes  Wt 182 lb 2 oz (82.6 kg)   BMI 22.92 kg/m  Wt Readings from Last 3 Encounters:  03/13/18 182 lb 2 oz (82.6 kg)  03/08/18 183 lb (83 kg)  02/25/18  177 lb 14.6 oz (80.7 kg)  GEN: NAD, appears stated age,  doesn't appear chronically ill PSYCH: Cooperative, without pressured speech EYE: Conjunctivae pink, sclerae anicteric ENT: MMM NECK: Supple CV: RR without R/Gs  RESP: CTAB posteriorly, without wheezing GI: NABS, soft, NT/ND, without rebound or guarding, no HSM appreciated MSK/EXT: No lower extremity edema SKIN: No jaundice NEURO:  Alert & Oriented x 3, no focal deficits   REVIEW OF DATA  I reviewed the following data at the time of this encounter:  GI Procedures and Studies  February 11, 2018 flexible sigmoidoscopy - Preparation of the colon was poor. - Blood in the entire examined colon. - An actively bleeding polypectomy site in the distal transverse colon. Injected. Tattooed. Clips (MR conditional) were placed. Bleeding controlled. - No specimens collected.  February 07, 2018 colonoscopy - Non-thrombosed internal hemorrhoids found on digital rectal exam. - The examined portion of the ileum was normal. - One 13 mm polyp in the descending colon, removed with mucosal resection. Resected and retrieved. Clips (MR conditional) were placed. - One 2 mm polyp at the recto-sigmoid colon, removed with a cold snare. Resected and retrieved. - Otherwise, normal mucosa in the entire examined colon. - Non-bleeding non-thrombosed internal hemorrhoids.  Laboratory Studies  Reviewed in epic  Imaging Studies  No relevant studies to review   ASSESSMENT  Mr. Leinbach is a 68 y.o. 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   The patient is seen today for evaluation and management of:  1. Iron deficiency anemia, unspecified iron deficiency anemia type   2. History of colonic polyps   3. Tubular adenoma of colon   4. Anticoagulated on Coumadin   5. Status post colonoscopy with polypectomy   6. Colonoscopy causing post-procedural bleeding    The patient is hemodynamically and clinically stable.  He has no GI issues/symptoms at this point in  time.  He had a single episode of bleeding per rectum which he felt was hemorrhoids few weeks after discharge from the hospital is not had that recur.  The patient has improving hemogram which should have repeat blood counts in approximately 2 to 3 months (we will order these and have them as future orders for which she can come into the office).  Otherwise, we discussed that he will be needing a 3-year follow-up colonoscopy for surveillance in the setting of the size of the polyp you had removed as well as the type of polyp.  The patient will reach out to [place] if other issues develop otherwise we will have a recall placed into the chart.  We will plan to clip any polyps that are found in the future aggressively to try and decrease his risk of recurrent bleeding in the setting of his underlying heart valve issues.  All patient questions were answered, to the best of my ability, and the patient agrees to the aforementioned plan of action with follow-up as indicated.   PLAN  Laboratories as outlined below as future orders to be done in 2 to 3 months Colonoscopy recall in 3 years for colon polyp surveillance/colon cancer screening   Orders Placed This Encounter  Procedures  . CBC  . IBC panel  . Ferritin  . B12 and Folate Panel    New Prescriptions   No medications on file   Modified Medications   No medications on file    Planned Follow Up: No follow-ups on file.   Justice Britain, MD  [facility] Office # [phone]

## 2018-03-13 NOTE — Patient Instructions (Signed)
Your provider has requested that you go to the basement level for lab work in April. Press "B" on the elevator. The lab is located at the first door on the left as you exit the elevator.  Thank you for entrusting me with your care and choosing [facility] care.  Dr Rush Landmark

## 2018-03-14 ENCOUNTER — Encounter: Payer: Self-pay | Admitting: Gastroenterology

## 2018-03-19 DIAGNOSIS — D509 Iron deficiency anemia, unspecified: Secondary | ICD-10-CM | POA: Insufficient documentation

## 2018-03-19 DIAGNOSIS — K91840 Postprocedural hemorrhage and hematoma of a digestive system organ or structure following a digestive system procedure: Secondary | ICD-10-CM | POA: Insufficient documentation

## 2018-03-19 DIAGNOSIS — Z7901 Long term (current) use of anticoagulants: Secondary | ICD-10-CM | POA: Insufficient documentation

## 2018-03-19 DIAGNOSIS — D126 Benign neoplasm of colon, unspecified: Secondary | ICD-10-CM | POA: Insufficient documentation

## 2018-03-22 ENCOUNTER — Ambulatory Visit (INDEPENDENT_AMBULATORY_CARE_PROVIDER_SITE_OTHER): Payer: Medicare Other | Admitting: *Deleted

## 2018-03-22 DIAGNOSIS — I059 Rheumatic mitral valve disease, unspecified: Secondary | ICD-10-CM | POA: Diagnosis not present

## 2018-03-22 DIAGNOSIS — Z5181 Encounter for therapeutic drug level monitoring: Secondary | ICD-10-CM | POA: Diagnosis not present

## 2018-03-22 DIAGNOSIS — Z9889 Other specified postprocedural states: Secondary | ICD-10-CM

## 2018-03-22 DIAGNOSIS — Z7901 Long term (current) use of anticoagulants: Secondary | ICD-10-CM

## 2018-03-22 LAB — POCT INR: INR: 2.7 (ref 2.0–3.0)

## 2018-03-22 NOTE — Patient Instructions (Signed)
Description   Continue on same dosage 1 tablet daily except 1.5 tablets on Mondays, Wednesdays, and Fridays. Recheck INR in 3 weeks. Call us with any new medications or concerns  # [phone]

## 2018-03-25 ENCOUNTER — Other Ambulatory Visit: Payer: Self-pay

## 2018-03-25 ENCOUNTER — Emergency Department (HOSPITAL_COMMUNITY): Payer: Medicare Other

## 2018-03-25 ENCOUNTER — Emergency Department (HOSPITAL_COMMUNITY)
Admission: EM | Admit: 2018-03-25 | Discharge: 2018-03-26 | Disposition: A | Discharge status: Home / Self Care | Payer: Medicare Other | Attending: Emergency Medicine | Admitting: Emergency Medicine

## 2018-03-25 DIAGNOSIS — I110 Hypertensive heart disease with heart failure: Secondary | ICD-10-CM | POA: Diagnosis not present

## 2018-03-25 DIAGNOSIS — Y929 Unspecified place or not applicable: Secondary | ICD-10-CM | POA: Diagnosis not present

## 2018-03-25 DIAGNOSIS — J45909 Unspecified asthma, uncomplicated: Secondary | ICD-10-CM | POA: Diagnosis not present

## 2018-03-25 DIAGNOSIS — I509 Heart failure, unspecified: Secondary | ICD-10-CM | POA: Diagnosis not present

## 2018-03-25 DIAGNOSIS — S0081XA Abrasion of other part of head, initial encounter: Secondary | ICD-10-CM | POA: Insufficient documentation

## 2018-03-25 DIAGNOSIS — Y939 Activity, unspecified: Secondary | ICD-10-CM | POA: Insufficient documentation

## 2018-03-25 DIAGNOSIS — W19XXXA Unspecified fall, initial encounter: Secondary | ICD-10-CM | POA: Insufficient documentation

## 2018-03-25 DIAGNOSIS — S0990XA Unspecified injury of head, initial encounter: Secondary | ICD-10-CM

## 2018-03-25 DIAGNOSIS — I10 Essential (primary) hypertension: Secondary | ICD-10-CM | POA: Diagnosis not present

## 2018-03-25 DIAGNOSIS — S0083XA Contusion of other part of head, initial encounter: Secondary | ICD-10-CM | POA: Diagnosis not present

## 2018-03-25 DIAGNOSIS — Z7901 Long term (current) use of anticoagulants: Secondary | ICD-10-CM | POA: Diagnosis not present

## 2018-03-25 DIAGNOSIS — Z79899 Other long term (current) drug therapy: Secondary | ICD-10-CM | POA: Diagnosis not present

## 2018-03-25 DIAGNOSIS — Z7982 Long term (current) use of aspirin: Secondary | ICD-10-CM | POA: Diagnosis not present

## 2018-03-25 DIAGNOSIS — Y999 Unspecified external cause status: Secondary | ICD-10-CM | POA: Diagnosis not present

## 2018-03-25 LAB — PROTIME-INR
INR: 2.51
Prothrombin Time: 26.8 seconds — ABNORMAL HIGH (ref 11.4–15.2)

## 2018-03-25 NOTE — ED Triage Notes (Addendum)
Patient fell and hit head; right above the eye lac. Takes Warfarin Denies LOC.

## 2018-03-25 NOTE — ED Notes (Signed)
Wound to R forehead cleaned and dressed with telfa and hypafix

## 2018-03-25 NOTE — Discharge Instructions (Addendum)
Please return for any problem.  Follow-up with your regular care provider as instructed. °
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
## 2018-03-26 ENCOUNTER — Telehealth: Payer: Self-pay | Admitting: Family Medicine

## 2018-03-26 NOTE — Telephone Encounter (Signed)
Patient states he fell and hit his head. Was seen at [place] last night. They said to follow up in a week but patient says he feels fine and can wait till next appointment on 05/01/2018. Would like to know what Dr.Metheney thought. Please advise.

## 2018-03-26 NOTE — Telephone Encounter (Signed)
Okay to keep follow-up in February.  From what I read from the note it was mostly a laceration so I do not think that needs to be followed up promptly unless he starts having any headaches nausea or vomiting.

## 2018-03-27 NOTE — Telephone Encounter (Signed)
Attempted to contact Pt, no answer and VM is full.  

## 2018-03-28 NOTE — Telephone Encounter (Signed)
Patient advised.

## 2018-03-29 ENCOUNTER — Other Ambulatory Visit: Payer: Self-pay | Admitting: Family Medicine

## 2018-03-29 NOTE — Telephone Encounter (Signed)
Historical provider.  

## 2018-04-09 ENCOUNTER — Telehealth: Payer: Self-pay | Admitting: Family Medicine

## 2018-04-09 NOTE — Telephone Encounter (Signed)
Please call pt: his insurance will no longer cover symbicort.  Is he feeling better.  Does he feel we can step down his inhaler?  I odn't know his actual diagnosis. Asthma or COPD?

## 2018-04-10 MED ORDER — FLUTICASONE-SALMETEROL 250-50 MCG/DOSE IN AEPB: 1.0000 | INHALATION_SPRAY | Freq: Two times a day (BID) | RESPIRATORY_TRACT | 1 refills | Status: DC

## 2018-04-10 NOTE — Telephone Encounter (Signed)
Spoke with Pt. He is still using the Symbicort BID. Reports taking this for his Asthma. Pt OK to try something different.

## 2018-04-10 NOTE — Telephone Encounter (Signed)
Our software program shows that Advair should be better covered though it is not always accurate but I did go ahead and send a prescription over for Advair to use in place of the Symbicort.  If he has any problems at the pharmacy please have the pharmacist call us back or he can let us know.

## 2018-04-11 NOTE — Telephone Encounter (Signed)
Patient advised of recommendations.  

## 2018-04-12 ENCOUNTER — Ambulatory Visit (INDEPENDENT_AMBULATORY_CARE_PROVIDER_SITE_OTHER): Payer: Medicare Other | Admitting: Pharmacist

## 2018-04-12 DIAGNOSIS — Z9889 Other specified postprocedural states: Secondary | ICD-10-CM | POA: Diagnosis not present

## 2018-04-12 DIAGNOSIS — Z7901 Long term (current) use of anticoagulants: Secondary | ICD-10-CM

## 2018-04-12 DIAGNOSIS — I059 Rheumatic mitral valve disease, unspecified: Secondary | ICD-10-CM | POA: Diagnosis not present

## 2018-04-12 LAB — POCT INR: INR: 3.4 — AB (ref 2.0–3.0)

## 2018-04-12 NOTE — Patient Instructions (Signed)
Description   Continue on same dosage 1 tablet daily except 1.5 tablets on Mondays, Wednesdays, and Fridays. Recheck INR in 6 weeks. Call us with any new medications or concerns  # [phone]

## 2018-05-01 ENCOUNTER — Ambulatory Visit: Payer: Medicare Other | Admitting: Family Medicine

## 2018-05-02 ENCOUNTER — Ambulatory Visit (INDEPENDENT_AMBULATORY_CARE_PROVIDER_SITE_OTHER): Payer: Medicare Other | Admitting: Family Medicine

## 2018-05-02 ENCOUNTER — Encounter: Payer: Self-pay | Admitting: Family Medicine

## 2018-05-02 VITALS — BP 138/62 | HR 60 | Ht 75.0 in | Wt 185.0 lb

## 2018-05-02 DIAGNOSIS — R197 Diarrhea, unspecified: Secondary | ICD-10-CM | POA: Diagnosis not present

## 2018-05-02 DIAGNOSIS — R7301 Impaired fasting glucose: Secondary | ICD-10-CM | POA: Diagnosis not present

## 2018-05-02 DIAGNOSIS — I10 Essential (primary) hypertension: Secondary | ICD-10-CM | POA: Diagnosis not present

## 2018-05-02 DIAGNOSIS — R310 Gross hematuria: Secondary | ICD-10-CM

## 2018-05-02 DIAGNOSIS — Z9889 Other specified postprocedural states: Secondary | ICD-10-CM | POA: Diagnosis not present

## 2018-05-02 LAB — POCT URINALYSIS DIPSTICK
Bilirubin, UA: NEGATIVE
Glucose, UA: NEGATIVE
Ketones, UA: NEGATIVE
Nitrite, UA: POSITIVE
PH UA: 7 (ref 5.0–8.0)
Protein, UA: NEGATIVE
Spec Grav, UA: 1.02 (ref 1.010–1.025)
UROBILINOGEN UA: 0.2 U/dL

## 2018-05-02 LAB — POCT GLYCOSYLATED HEMOGLOBIN (HGB A1C): Hemoglobin A1C: 5.4 % (ref 4.0–5.6)

## 2018-05-02 MED ORDER — SULFAMETHOXAZOLE-TRIMETHOPRIM 800-160 MG PO TABS: 1.0000 | ORAL_TABLET | Freq: Two times a day (BID) | ORAL | 0 refills | Status: DC

## 2018-05-02 NOTE — Addendum Note (Signed)
Addended by: Beatrice Lecher D on: 05/02/2018 10:02 AM   Modules accepted: Orders

## 2018-05-02 NOTE — Patient Instructions (Addendum)
If diarrhea not better by Monday then call me back

## 2018-05-02 NOTE — Addendum Note (Signed)
Addended by: Teddy Spike on: 05/02/2018 09:53 AM   Modules accepted: Orders

## 2018-05-02 NOTE — Progress Notes (Addendum)
Subjective:    CC: BP check  HPI: Hypertension- Pt denies chest pain, SOB, dizziness, or heart palpitations.  Taking meds as directed w/o problems.  Denies medication side effects.    Mechanical valve replacement  - He is coumadin.  He did have a fall about a month ago and still has a little bit of a goose egg on his right forehead and a little bit of bleeding into the sclera of the eye but it is getting a lot better he did go to the emergency department and was evaluated for it.  He has had 2 episodes where he noticed a little bit of blood at the beginning of urination but has not noticed it since then.  He did not know if it was from straining or heavy lifting that might of caused it.  He says his last INR was around 3 and is supposed to follow-up in 6 weeks for his next check.  Impaired fasting glucose-no increased thirst or urination. No symptoms consistent with hypoglycemia.  Been eating some cookies and drinking Gatorade more recently.  He has been having some diarrhea for about the last week.  He says he is having 2-3 bowel movements per day and normally he only has 1.  He has been using some Imodium and that seems to help.  He said he also developed some nasal congestion right around the time that it started and he has been around some sick contacts in the home who have upper respiratory symptoms.  He has had some intermittent nausea but no vomiting.  No blood in the stool.  Denies any antibiotic use recently.  Past medical history, Surgical history, Family history not pertinant except as noted below, Social history, Allergies, and medications have been entered into the medical record, reviewed, and corrections made.   Review of Systems: No fevers, chills, night sweats, weight loss, chest pain, or shortness of breath.   Objective:    General: Well Developed, well nourished, and in no acute distress.  Neuro: Alert and oriented x3, extra-ocular muscles intact, sensation grossly intact.   HEENT: Normocephalic, atraumatic  Skin: Warm and dry, no rashes. Cardiac: Regular rate and rhythm, no murmurs rubs or gallops, no lower extremity edema.  Respiratory: Clear to auscultation bilaterally. Not using accessory muscles, speaking in full sentences.   Impression and Recommendations:    HTN - Well controlled. Continue current regimen. Follow up in  6 months.    Mechanical valve replacement  -Coumadin being monitored by cardiology.  IFG  -A1c looks fantastic in fact will probably just go to yearly monitoring at this point.  He has lost weight and has maintained his lower weight.  Gross hematuria -2 episodes of gross hematuria so did want a check his urine today for any microscopic hematuria.  Urinalysis showed leukocytes and nitrites.  When I reviewed this information with him he said that he did have some burning with urination about a week or so ago but actually it kind of got better but now he is also had persistent nausea which could also be a symptom of a urinary tract infection.  I will send urine for culture we discussed options he would like to go ahead and start treatment while we are awaiting culture.  Prescription sent for Bactrim.  Wanted to limit fluoroquinolone use.  This can interact with his Coumadin.   Diarrhea-it sounds like it is been getting a little bit better on its own.  Not sure if he ate something bad  or if it was more of a viral illness.  He never ran a fever had vomiting.  He could start an H2 blocker such as Zantac or Pepcid for the nausea and stomach upset.  If not better after the weekend then please give [place] a call back.  And will do further work-up with stool cultures and checking for C. difficile.  He is does not think he had had any antibiotics recently.

## 2018-05-04 LAB — URINE CULTURE
MICRO NUMBER: 251786
SPECIMEN QUALITY:: ADEQUATE

## 2018-05-07 ENCOUNTER — Ambulatory Visit (INDEPENDENT_AMBULATORY_CARE_PROVIDER_SITE_OTHER): Payer: Medicare Other | Admitting: Family Medicine

## 2018-05-07 ENCOUNTER — Encounter: Payer: Self-pay | Admitting: Family Medicine

## 2018-05-07 VITALS — BP 145/78 | HR 85 | Temp 97.8°F | Ht 75.0 in | Wt 180.0 lb

## 2018-05-07 DIAGNOSIS — R197 Diarrhea, unspecified: Secondary | ICD-10-CM

## 2018-05-07 DIAGNOSIS — A0472 Enterocolitis due to Clostridium difficile, not specified as recurrent: Secondary | ICD-10-CM

## 2018-05-07 LAB — CBC WITH DIFFERENTIAL/PLATELET
ABSOLUTE MONOCYTES: 1276 {cells}/uL — AB (ref 200–950)
Basophils Absolute: 72 cells/uL (ref 0–200)
Basophils Relative: 1.3 %
Eosinophils Absolute: 160 cells/uL (ref 15–500)
Eosinophils Relative: 2.9 %
HCT: 44.9 % (ref 38.5–50.0)
Hemoglobin: 15 g/dL (ref 13.2–17.1)
Lymphs Abs: 897 cells/uL (ref 850–3900)
MCH: 28.8 pg (ref 27.0–33.0)
MCHC: 33.4 g/dL (ref 32.0–36.0)
MCV: 86.3 fL (ref 80.0–100.0)
MPV: 11.1 fL (ref 7.5–12.5)
Monocytes Relative: 23.2 %
Neutro Abs: 3097 cells/uL (ref 1500–7800)
Neutrophils Relative %: 56.3 %
Platelets: 231 10*3/uL (ref 140–400)
RBC: 5.2 10*6/uL (ref 4.20–5.80)
RDW: 13.1 % (ref 11.0–15.0)
Total Lymphocyte: 16.3 %
WBC: 5.5 10*3/uL (ref 3.8–10.8)

## 2018-05-07 NOTE — Progress Notes (Addendum)
Acute Office Visit  Subjective:    Patient ID: Corey Mathis, male    DOB: Aug 29, 1950, 68 y.o.   MRN: [id number]  Chief Complaint  Patient presents with  . Diarrhea    HPI Patient is in today for diarrhea for > 10 days.  Scribes the stools as loose and watery.  He has been having about 3-4 bowel movements per day.  In fact today he is Corey Mathis had 3 bowel movements.  He denies any blood in the stool.  He denies any fevers chills or sweats.  He is actually supposed to leave today to drive to [place] and will be gone for about 3 to 4 days.  He was started on an antibiotic couple of days ago but the diarrhea started well before that.  Past Medical History:  Diagnosis Date  . Allergy   . Anxiety   . Asthma   . CHF (congestive heart failure) ([place])   . Dyslipidemia   . History of mitral valve replacement   . Hypertension   . Labyrinthitis    hx    Past Surgical History:  Procedure Laterality Date  . ACNE CYST REMOVAL     from hand and back  . EXTERNAL FIXATION LEG Right 08/11/2017   Procedure: EXTERNAL FIXATION, RIGHT KNEE;  Surgeon: Leandrew Koyanagi, MD;  Location: [facility];  Service: Orthopedics;  Laterality: Right;  . FINE NEEDLE ASPIRATION Right 08/11/2017   Procedure: FINE NEEDLE ASPIRATION  of right Knee with 80 cc of dark red fluid removed;  Surgeon: Leandrew Koyanagi, MD;  Location: [facility];  Service: Orthopedics;  Laterality: Right;  . FLEXIBLE SIGMOIDOSCOPY N/A 02/11/2018   Procedure: FLEXIBLE SIGMOIDOSCOPY;  Surgeon: Doran Stabler, MD;  Location: [facility];  Service: Gastroenterology;  Laterality: N/A;  . Jaw surgery (other)    . NASAL SINUS SURGERY N/A 10/16/2012   Procedure: NASAL ENDOSCOPIC POLYPECTOMY/MAXILLARY ANTROSTOMY/ETHMOIDECTOMY;  Surgeon: Izora Gala, MD;  Location: [facility];  Service: ENT;  Laterality: N/A;  . ORIF TIBIA PLATEAU Left 08/13/2017   Procedure: OPEN REDUCTION INTERNAL FIXATION (ORIF) TIBIAL PLATEAU;  Surgeon: Leandrew Koyanagi, MD;  Location: [facility];  Service:  Orthopedics;  Laterality: Left;  . ORIF TIBIA PLATEAU Right 08/22/2017   Procedure: OPEN REDUCTION INTERNAL FIXATION (ORIF) RIGHT BICONDYLAR TIBIAL PLATEAU, REMOVAL OF EX FIX;  Surgeon: Leandrew Koyanagi, MD;  Location: [facility];  Service: Orthopedics;  Laterality: Right;  . RIGHT/LEFT HEART CATH AND CORONARY ANGIOGRAPHY N/A 07/12/2017   Procedure: RIGHT/LEFT HEART CATH AND CORONARY ANGIOGRAPHY;  Surgeon: Burnell Blanks, MD;  Location: [facility];  Service: Cardiovascular;  Laterality: N/A;  . SUBMUCOSAL INJECTION  02/11/2018   Procedure: SUBMUCOSAL INJECTION;  Surgeon: Doran Stabler, MD;  Location: [facility];  Service: Gastroenterology;;  . TOOTH EXTRACTION    . VALVE REPLACEMENT  1998   St. Jude, mitral    Family History  Problem Relation Age of Onset  . Heart attack Father 47  . Colon cancer Mother 36  . Hypertension Mother   . Stomach cancer Neg Hx   . Esophageal cancer Neg Hx   . Inflammatory bowel disease Neg Hx   . Liver disease Neg Hx   . Pancreatic cancer Neg Hx     Social History   Socioeconomic History  . Marital status: Married    Spouse name: Frenchie   . Number of children: 2  . Years of education: Not on file  . Highest education level:  Not on file  Occupational History  . Occupation: retired    Fish farm manager: [employer]  Social Needs  . Financial resource strain: Not on file  . Food insecurity:    Worry: Not on file    Inability: Not on file  . Transportation needs:    Medical: Not on file    Non-medical: Not on file  Tobacco Use  . Smoking status: Never Smoker  . Smokeless tobacco: Never Used  Substance and Sexual Activity  . Alcohol use: Never    Frequency: Never  . Drug use: Never  . Sexual activity: Not Currently  Lifestyle  . Physical activity:    Days per week: Not on file    Minutes per session: Not on file  . Stress: Not on file  Relationships  . Social connections:    Talks on phone: Not on file    Gets together: Not on  file    Attends religious service: Not on file    Active member of club or organization: Not on file    Attends meetings of clubs or organizations: Not on file    Relationship status: Not on file  . Intimate partner violence:    Fear of current or ex partner: Not on file    Emotionally abused: Not on file    Physically abused: Not on file    Forced sexual activity: Not on file  Other Topics Concern  . Not on file  Social History Narrative   ** Merged History Encounter **       Some exercise, bike and walking.  2 cups per day.     Outpatient Medications Prior to Visit  Medication Sig Dispense Refill  . albuterol (PROAIR HFA) 108 (90 Base) MCG/ACT inhaler Inhale 2 puffs into the lungs every 6 (six) hours as needed for wheezing. 3 Inhaler 2  . antiseptic oral rinse (BIOTENE) LIQD 5 mLs by Mouth Rinse route 2 (two) times daily.    Marland Kitchen aspirin 81 MG EC tablet Take 81 mg by mouth at bedtime.     . cholecalciferol (VITAMIN D) 1000 units tablet Take 1,000 Units by mouth at bedtime.     Marland Kitchen COENZYME Q10 PO Take 1 capsule by mouth at bedtime.     . Flax Oil-Fish Oil-Borage Oil (FISH OIL-FLAX OIL-BORAGE OIL) CAPS Take 1 capsule by mouth at bedtime.     . fluticasone (FLONASE) 50 MCG/ACT nasal spray Place 1 spray into both nostrils daily as needed (congestion).     . Fluticasone-Salmeterol (ADVAIR DISKUS) 250-50 MCG/DOSE AEPB Inhale 1 puff into the lungs 2 (two) times daily. 60 each 1  . Melatonin 5 MG TABS Take 5 mg by mouth at bedtime.     . metoprolol succinate (TOPROL-XL) 100 MG 24 hr tablet Take 100 mg by mouth See admin instructions. Take one tablet (100 mg) by mouth with a 50 mg tablet daily at bedtime - for a total dose of 150 mg  3  . metoprolol succinate (TOPROL-XL) 50 MG 24 hr tablet TAKE 1 TABLET BY MOUTH ONCE DAILY (Patient taking differently: Take 50 mg by mouth See admin instructions. Take one tablet (50 mg) by mouth with a 100 mg tablet daily at bedtime - for a total dose of 150 mg) 90  tablet 1  . Multiple Vitamin (MULTIVITAMIN WITH MINERALS) TABS tablet Take 1 tablet by mouth at bedtime.    Marland Kitchen PARoxetine (PAXIL) 20 MG tablet Take 1 tablet (20 mg total) by mouth daily. (Patient taking  differently: Take 20 mg by mouth at bedtime. ) 90 tablet 1  . sulfamethoxazole-trimethoprim (BACTRIM DS,SEPTRA DS) 800-160 MG tablet Take 1 tablet by mouth 2 (two) times daily. 20 tablet 0  . warfarin (COUMADIN) 10 MG tablet Take 1 tablet (10 mg total) by mouth daily. Take As Directed (Patient taking differently: Take 10-15 mg by mouth See admin instructions. Take 1 1/2 tablets (15 mg) by mouth every Monday, Wednesday, Friday night, take 1 tablet (10 mg) on Sunday, Tuesday, Thursday, Saturday night) 120 tablet 1   No facility-administered medications prior to visit.     Allergies  Allergen Reactions  . Lisinopril Anaphylaxis and Other (See Comments)    angioedema  . Carvedilol Other (See Comments)    wheezing  . Ezetimibe-Simvastatin Other (See Comments)    Serious vertigo  . Propranolol Hcl Other (See Comments)    wheezing  . Ramipril Other (See Comments)    wheezing  . Codeine Nausea Only    ROS     Objective:    Physical Exam  Constitutional: He is oriented to person, place, and time. He appears well-developed and well-nourished.  HENT:  Head: Normocephalic and atraumatic.  Eyes: Conjunctivae are normal.  Cardiovascular: Normal rate, regular rhythm and normal heart sounds.  Pulmonary/Chest: Effort normal and breath sounds normal.  Abdominal: Soft. Bowel sounds are normal. He exhibits no distension and no mass. There is no abdominal tenderness. There is no rebound and no guarding.  Neurological: He is alert and oriented to person, place, and time.  Skin: Skin is warm and dry.  Psychiatric: He has a normal mood and affect. His behavior is normal.    BP (!) 145/78   Pulse 85   Temp 97.8 F (36.6 C) (Oral)   Ht 6\' 3"  (1.905 m)   Wt 180 lb (81.6 kg)   SpO2 100%   BMI  22.50 kg/m  Wt Readings from Last 3 Encounters:  05/07/18 180 lb (81.6 kg)  05/02/18 185 lb (83.9 kg)  03/13/18 182 lb 2 oz (82.6 kg)    There are no preventive care reminders to display for this patient.  There are no preventive care reminders to display for this patient.   Lab Results  Component Value Date   TSH 1.14 07/27/2016   Lab Results  Component Value Date   WBC 5.5 03/08/2018   HGB 12.4 (L) 03/08/2018   HCT 38.3 (L) 03/08/2018   MCV 89.3 03/08/2018   PLT 255 03/08/2018   Lab Results  Component Value Date   NA 138 02/13/2018   K 3.4 (L) 02/13/2018   CO2 23 02/13/2018   GLUCOSE 106 (H) 02/13/2018   BUN 8 02/13/2018   CREATININE 0.82 02/13/2018   BILITOT 0.7 11/01/2017   ALKPHOS 72 08/10/2017   AST 23 11/01/2017   ALT 11 11/01/2017   PROT 7.9 11/01/2017   ALBUMIN 3.9 08/10/2017   CALCIUM 8.3 (L) 02/13/2018   ANIONGAP 9 02/13/2018   GFR 96.14 05/28/2014   Lab Results  Component Value Date   CHOL 206 (H) 11/01/2017   Lab Results  Component Value Date   HDL 41 11/01/2017   Lab Results  Component Value Date   LDLCALC 138 (H) 11/01/2017   Lab Results  Component Value Date   TRIG 147 11/01/2017   Lab Results  Component Value Date   CHOLHDL 5.0 (H) 11/01/2017   Lab Results  Component Value Date   HGBA1C 5.4 05/02/2018       Assessment &  Plan:   Problem List Items Addressed This Visit    None    Visit Diagnoses    Diarrhea of presumed infectious origin    -  Primary   Relevant Orders   Stool culture   Clostridium difficile Toxin B, Qualitative, Real-Time PCR([facility])   CBC with Differential/Platelet     Diarrhea-at this point he is had diarrhea for almost 2 weeks without resolving symptoms.  The Imodium just helps a little.  For now he can use that for travel.  We can go ahead and move forward and get stool cultures and check for C. difficile especially since he was in the hospital couple weeks ago this certainly increases his risk.  Make  sure staying well-hydrated and also get a check a CBC even though he has been afebrile which is reassuring.  C. difficile-stool culture came back positive for C. difficile.  Will treat with oral vancomycin.  If too costly please let me know and we can we switch to metronidazole.  Abdominal exam is benign which is also reassuring.  And has not noticed any blood in the stool.  Will call with results once available.  No orders of the defined types were placed in this encounter.    Beatrice Lecher, MD

## 2018-05-08 DIAGNOSIS — R197 Diarrhea, unspecified: Secondary | ICD-10-CM | POA: Diagnosis not present

## 2018-05-10 ENCOUNTER — Other Ambulatory Visit: Payer: Self-pay | Admitting: *Deleted

## 2018-05-10 MED ORDER — VANCOMYCIN HCL 125 MG PO CAPS: 125.0000 mg | ORAL_CAPSULE | Freq: Four times a day (QID) | ORAL | 0 refills | Status: AC

## 2018-05-10 MED ORDER — VANCOMYCIN HCL 125 MG PO CAPS: 125.0000 mg | ORAL_CAPSULE | Freq: Four times a day (QID) | ORAL | 0 refills | Status: DC

## 2018-05-10 NOTE — Addendum Note (Signed)
Addended by: Beatrice Lecher D on: 05/10/2018 08:50 AM   Modules accepted: Orders, Level of Service

## 2018-05-12 LAB — STOOL CULTURE
MICRO NUMBER:: 278673
MICRO NUMBER:: 278674
MICRO NUMBER:: 278675
SHIGA RESULT:: NOT DETECTED
SPECIMEN QUALITY:: ADEQUATE
SPECIMEN QUALITY:: ADEQUATE
SPECIMEN QUALITY:: ADEQUATE

## 2018-05-12 LAB — CLOSTRIDIUM DIFFICILE TOXIN B, QUALITATIVE, REAL-TIME PCR: Toxigenic C. Difficile by PCR: DETECTED — AB

## 2018-05-20 ENCOUNTER — Other Ambulatory Visit: Payer: Self-pay | Admitting: *Deleted

## 2018-05-20 MED ORDER — WARFARIN SODIUM 10 MG PO TABS: 10.0000 mg | ORAL_TABLET | ORAL | 0 refills | Status: DC

## 2018-05-23 ENCOUNTER — Ambulatory Visit (INDEPENDENT_AMBULATORY_CARE_PROVIDER_SITE_OTHER): Payer: Medicare Other

## 2018-05-23 ENCOUNTER — Other Ambulatory Visit: Payer: Self-pay

## 2018-05-23 DIAGNOSIS — Z9889 Other specified postprocedural states: Secondary | ICD-10-CM | POA: Diagnosis not present

## 2018-05-23 DIAGNOSIS — Z7901 Long term (current) use of anticoagulants: Secondary | ICD-10-CM | POA: Diagnosis not present

## 2018-05-23 DIAGNOSIS — I059 Rheumatic mitral valve disease, unspecified: Secondary | ICD-10-CM

## 2018-05-23 LAB — POCT INR: INR: 3.6 — AB (ref 2.0–3.0)

## 2018-05-23 NOTE — Patient Instructions (Signed)
Description   Take 1/2 tablet today, then resume same dosage 1 tablet daily except 1.5 tablets on Mondays, Wednesdays, and Fridays. Recheck INR in 6 weeks. Call us with any new medications or concerns  # [phone]

## 2018-05-27 ENCOUNTER — Other Ambulatory Visit: Payer: Self-pay | Admitting: Cardiology

## 2018-05-27 DIAGNOSIS — I428 Other cardiomyopathies: Secondary | ICD-10-CM

## 2018-05-27 DIAGNOSIS — Z23 Encounter for immunization: Secondary | ICD-10-CM | POA: Diagnosis not present

## 2018-06-05 ENCOUNTER — Other Ambulatory Visit: Payer: Self-pay

## 2018-06-05 MED ORDER — FLUTICASONE-SALMETEROL 250-50 MCG/DOSE IN AEPB: 1.0000 | INHALATION_SPRAY | Freq: Two times a day (BID) | RESPIRATORY_TRACT | 1 refills | Status: DC

## 2018-06-15 ENCOUNTER — Other Ambulatory Visit: Payer: Self-pay | Admitting: Family Medicine

## 2018-06-17 ENCOUNTER — Other Ambulatory Visit: Payer: Self-pay | Admitting: *Deleted

## 2018-06-17 ENCOUNTER — Telehealth: Payer: Self-pay | Admitting: *Deleted

## 2018-06-17 NOTE — Telephone Encounter (Signed)
Error

## 2018-07-03 ENCOUNTER — Telehealth: Payer: Self-pay

## 2018-07-03 NOTE — Telephone Encounter (Signed)
lmom for prescreen  

## 2018-07-04 ENCOUNTER — Other Ambulatory Visit: Payer: Self-pay

## 2018-07-04 ENCOUNTER — Ambulatory Visit (INDEPENDENT_AMBULATORY_CARE_PROVIDER_SITE_OTHER): Payer: Medicare Other | Admitting: Pharmacist

## 2018-07-04 ENCOUNTER — Telehealth: Payer: Self-pay | Admitting: Pharmacist

## 2018-07-04 DIAGNOSIS — Z7901 Long term (current) use of anticoagulants: Secondary | ICD-10-CM

## 2018-07-04 DIAGNOSIS — I059 Rheumatic mitral valve disease, unspecified: Secondary | ICD-10-CM | POA: Diagnosis not present

## 2018-07-04 DIAGNOSIS — Z9889 Other specified postprocedural states: Secondary | ICD-10-CM | POA: Diagnosis not present

## 2018-07-04 LAB — POCT INR: INR: 4.4 — AB (ref 2.0–3.0)

## 2018-07-04 NOTE — Telephone Encounter (Signed)
1. Do you currently have a fever? no (yes = cancel and refer to pcp for e-visit) 2. Have you recently travelled on a cruise, internationally, or to [place], [place], [place], [place], [place], or [place], [place] [employer]) ? no (yes = cancel, stay home, monitor symptoms, and contact pcp or initiate e-visit if symptoms develop) 3. Have you been in contact with someone that is currently pending confirmation of Covid19 testing or has been confirmed to have the [place] virus?  no (yes = cancel, stay home, away from tested individual, monitor symptoms, and contact pcp or initiate e-visit if symptoms develop) 4. Are you currently experiencing fatigue or cough? no (yes = pt should be prepared to have a mask placed at the time of their visit).  Pt. Advised that we are restricting visitors at this time and anyone present in the vehicle should meet the above criteria as well. Advised that visit will be at curbside for finger stick ONLY and will receive call with instructions. Pt also advised to please bring own pen for signature of arrival document.   Appointment changed to 2:00

## 2018-07-09 ENCOUNTER — Other Ambulatory Visit: Payer: Self-pay

## 2018-07-09 DIAGNOSIS — I428 Other cardiomyopathies: Secondary | ICD-10-CM

## 2018-07-09 MED ORDER — METOPROLOL SUCCINATE ER 50 MG PO TB24: 50.0000 mg | ORAL_TABLET | ORAL | 1 refills | Status: DC

## 2018-08-02 ENCOUNTER — Other Ambulatory Visit: Payer: Self-pay | Admitting: Cardiology

## 2018-08-02 DIAGNOSIS — I428 Other cardiomyopathies: Secondary | ICD-10-CM

## 2018-08-02 NOTE — Telephone Encounter (Signed)
°*  STAT* If patient is at the pharmacy, call can be transferred to refill team.   1. Which medications need to be refilled? (please list name of each medication and dose if known)  metoprolol succinate (TOPROL-XL) 50 MG 24 hr tablet  2. Which pharmacy/location (including street and city if local pharmacy) is medication to be sent to? [facility], [address]  3. Do they need a 30 day or 90 day supply? 90  Pt takes a total of 150mg  daily. He said he has an rx for the 100mg  pills as well as the 5mg  pills

## 2018-08-05 ENCOUNTER — Telehealth: Payer: Self-pay

## 2018-08-05 MED ORDER — METOPROLOL SUCCINATE ER 50 MG PO TB24: ORAL_TABLET | ORAL | 1 refills | Status: DC

## 2018-08-05 NOTE — Telephone Encounter (Signed)
toprol refilled.

## 2018-08-05 NOTE — Telephone Encounter (Signed)
lmom for prescreen  

## 2018-08-05 NOTE — Telephone Encounter (Signed)

## 2018-08-09 ENCOUNTER — Other Ambulatory Visit: Payer: Self-pay

## 2018-08-09 ENCOUNTER — Ambulatory Visit (INDEPENDENT_AMBULATORY_CARE_PROVIDER_SITE_OTHER): Payer: Medicare Other | Admitting: *Deleted

## 2018-08-09 DIAGNOSIS — I059 Rheumatic mitral valve disease, unspecified: Secondary | ICD-10-CM | POA: Diagnosis not present

## 2018-08-09 DIAGNOSIS — Z7901 Long term (current) use of anticoagulants: Secondary | ICD-10-CM

## 2018-08-09 DIAGNOSIS — Z9889 Other specified postprocedural states: Secondary | ICD-10-CM

## 2018-08-09 LAB — POCT INR: INR: 5.1 — AB (ref 2.0–3.0)

## 2018-08-09 NOTE — Patient Instructions (Signed)
Description   Skip tomorrow's dose, on Sunday only take 5mg , then change your dose to 10mg  daily except 15mg  on Mondays and Fridays.  Resume normal greens intake. Recheck in 2 weeks. Call us with any new medications or concerns  # [phone]

## 2018-08-10 NOTE — Progress Notes (Signed)
See INR results >5. FYI

## 2018-08-19 ENCOUNTER — Telehealth: Payer: Self-pay

## 2018-08-19 NOTE — Telephone Encounter (Signed)
lmom for prescreen  

## 2018-08-19 NOTE — Telephone Encounter (Signed)
1. COVID-19 Pre-Screening Questions:  . In the past 7 to 10 days have you had a cough,  shortness of breath, headache, congestion, fever (100 or greater) body aches, chills, sore throat, or sudden loss of taste or sense of smell?  No . Have you been around anyone with known Covid 19.  No . Have you been around anyone who is awaiting Covid 19 test results in the past 7 to 10 days?  No . Have you been around anyone who has been exposed to Covid 19, or has mentioned symptoms of Covid 19 within the past 7 to 10 days?  NO    2. Pt advised of visitor restrictions (no visitors allowed except if needed to conduct the visit). Also advised to arrive at appointment time and wear a mask.

## 2018-08-23 ENCOUNTER — Other Ambulatory Visit: Payer: Self-pay

## 2018-08-23 ENCOUNTER — Ambulatory Visit (INDEPENDENT_AMBULATORY_CARE_PROVIDER_SITE_OTHER): Payer: Medicare Other | Admitting: *Deleted

## 2018-08-23 DIAGNOSIS — Z9889 Other specified postprocedural states: Secondary | ICD-10-CM | POA: Diagnosis not present

## 2018-08-23 DIAGNOSIS — Z7901 Long term (current) use of anticoagulants: Secondary | ICD-10-CM | POA: Diagnosis not present

## 2018-08-23 DIAGNOSIS — I059 Rheumatic mitral valve disease, unspecified: Secondary | ICD-10-CM

## 2018-08-23 LAB — POCT INR: INR: 1.7 — AB (ref 2.0–3.0)

## 2018-08-23 NOTE — Patient Instructions (Signed)
Description   Today take 20mg  then change your dose to 10mg  daily except 15mg  on Mondays, Wednesdays and Fridays. Continue  normal greens intake. Recheck in 2 weeks. Call us with any new medications or concerns  # [phone]

## 2018-09-02 ENCOUNTER — Telehealth: Payer: Self-pay

## 2018-09-02 NOTE — Telephone Encounter (Signed)

## 2018-09-02 NOTE — Telephone Encounter (Signed)
LMOM FOR PRESCREEN  

## 2018-09-09 ENCOUNTER — Ambulatory Visit (INDEPENDENT_AMBULATORY_CARE_PROVIDER_SITE_OTHER): Payer: Medicare Other | Admitting: Pharmacist

## 2018-09-09 ENCOUNTER — Other Ambulatory Visit: Payer: Self-pay

## 2018-09-09 DIAGNOSIS — Z7901 Long term (current) use of anticoagulants: Secondary | ICD-10-CM | POA: Diagnosis not present

## 2018-09-09 DIAGNOSIS — I059 Rheumatic mitral valve disease, unspecified: Secondary | ICD-10-CM

## 2018-09-09 DIAGNOSIS — Z9889 Other specified postprocedural states: Secondary | ICD-10-CM | POA: Diagnosis not present

## 2018-09-09 LAB — POCT INR: INR: 4.4 — AB (ref 2.0–3.0)

## 2018-09-09 NOTE — Patient Instructions (Signed)
HOLD warfarin dose today July/6 ONLY, then 10mg  daily except 15mg  on Mondays, Wednesdays and Fridays. Continue  normal greens intake. Recheck in 2 weeks. Call us with any new medications or concerns  # [phone]

## 2018-09-11 ENCOUNTER — Other Ambulatory Visit: Payer: Self-pay

## 2018-09-12 MED ORDER — WARFARIN SODIUM 10 MG PO TABS: 10.0000 mg | ORAL_TABLET | ORAL | 0 refills | Status: DC

## 2018-09-19 ENCOUNTER — Telehealth: Payer: Self-pay

## 2018-09-19 NOTE — Telephone Encounter (Signed)
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
## 2018-09-23 ENCOUNTER — Ambulatory Visit (INDEPENDENT_AMBULATORY_CARE_PROVIDER_SITE_OTHER): Payer: Medicare Other | Admitting: *Deleted

## 2018-09-23 ENCOUNTER — Other Ambulatory Visit: Payer: Self-pay

## 2018-09-23 DIAGNOSIS — Z7901 Long term (current) use of anticoagulants: Secondary | ICD-10-CM | POA: Diagnosis not present

## 2018-09-23 DIAGNOSIS — I059 Rheumatic mitral valve disease, unspecified: Secondary | ICD-10-CM | POA: Diagnosis not present

## 2018-09-23 DIAGNOSIS — Z9889 Other specified postprocedural states: Secondary | ICD-10-CM

## 2018-09-23 LAB — POCT INR: INR: 3.6 — AB (ref 2.0–3.0)

## 2018-09-23 NOTE — Patient Instructions (Signed)
Description   Tomorrow take 5mg , then continue taking 10mg  daily except 15mg  on Mondays, Wednesdays and Fridays. Continue  normal greens intake. Recheck in 2 weeks. Call us with any new medications or concerns  # [phone]

## 2018-09-25 ENCOUNTER — Other Ambulatory Visit: Payer: Self-pay | Admitting: Family Medicine

## 2018-10-01 ENCOUNTER — Telehealth (INDEPENDENT_AMBULATORY_CARE_PROVIDER_SITE_OTHER): Payer: Medicare Other | Admitting: Cardiology

## 2018-10-01 ENCOUNTER — Telehealth: Payer: Self-pay | Admitting: Cardiology

## 2018-10-01 VITALS — BP 133/85 | Ht 75.0 in | Wt 185.0 lb

## 2018-10-01 DIAGNOSIS — I428 Other cardiomyopathies: Secondary | ICD-10-CM

## 2018-10-01 DIAGNOSIS — I10 Essential (primary) hypertension: Secondary | ICD-10-CM

## 2018-10-01 DIAGNOSIS — I712 Thoracic aortic aneurysm, without rupture, unspecified: Secondary | ICD-10-CM

## 2018-10-01 DIAGNOSIS — I059 Rheumatic mitral valve disease, unspecified: Secondary | ICD-10-CM | POA: Diagnosis not present

## 2018-10-01 MED ORDER — HYDRALAZINE HCL 25 MG PO TABS: 25.0000 mg | ORAL_TABLET | Freq: Three times a day (TID) | ORAL | 3 refills | Status: DC

## 2018-10-01 MED ORDER — ISOSORBIDE MONONITRATE ER 30 MG PO TB24: 30.0000 mg | ORAL_TABLET | Freq: Every day | ORAL | 3 refills | Status: DC

## 2018-10-01 NOTE — Addendum Note (Signed)
Addended by: Cristopher Estimable on: 10/01/2018 03:10 PM   Modules accepted: Orders

## 2018-10-01 NOTE — Patient Instructions (Addendum)
Medication Instructions:  START HYDRALAZINE 25 MG ONE TABLET THREE TIMES DAILY  START ISOSORBIDE 30 MG ONCE DAILY If you need a refill on your cardiac medications before your next appointment, please call your pharmacy.   Lab work: Your physician recommends that you return for lab work in: [place] If you have labs (blood work) drawn today and your tests are completely normal, you will receive your results only by: Marland Kitchen MyChart Message (if you have MyChart) OR . A paper copy in the mail If you have any lab test that is abnormal or we need to change your treatment, we will call you to review the results.  Testing/Procedures: CTA OF THE CHEST W/WO TO FOLLOW UP THORACIC ANEURYSM @ [place]  Follow-Up: At [facility], you and your health needs are our priority.  As part of our continuing mission to provide you with exceptional heart care, we have created designated Provider Care Teams.  These Care Teams include your primary Cardiologist (physician) and Advanced Practice Providers (APPs -  Physician Assistants and Nurse Practitioners) who all work together to provide you with the care you need, when you need it. You will need a follow up appointment in 6 months.  Please call our office 2 months in advance to schedule this appointment.  You may see Kirk Ruths, MD or one of the following Advanced Practice Providers on your designated Care Team:   Kerin Ransom, PA-C Roby Lofts, [place] . Sande Rives, PA-C

## 2018-10-01 NOTE — Telephone Encounter (Signed)

## 2018-10-04 ENCOUNTER — Telehealth: Payer: Self-pay

## 2018-10-04 NOTE — Telephone Encounter (Signed)

## 2018-10-07 ENCOUNTER — Ambulatory Visit (INDEPENDENT_AMBULATORY_CARE_PROVIDER_SITE_OTHER): Payer: Medicare Other | Admitting: Pharmacist

## 2018-10-07 ENCOUNTER — Other Ambulatory Visit: Payer: Self-pay

## 2018-10-07 DIAGNOSIS — Z7901 Long term (current) use of anticoagulants: Secondary | ICD-10-CM | POA: Diagnosis not present

## 2018-10-07 DIAGNOSIS — I059 Rheumatic mitral valve disease, unspecified: Secondary | ICD-10-CM | POA: Diagnosis not present

## 2018-10-07 DIAGNOSIS — Z9889 Other specified postprocedural states: Secondary | ICD-10-CM

## 2018-10-07 LAB — POCT INR: INR: 4.9 — AB (ref 2.0–3.0)

## 2018-10-07 NOTE — Patient Instructions (Signed)
HOLD warfarin dose today August 3 ONLY, then resume at lower dose of 10mg  daily except 15mg  on Mondays and Fridays. Continue  normal greens intake. Recheck in 2 weeks. Call us with any new medications or concerns  # [phone]

## 2018-10-14 ENCOUNTER — Ambulatory Visit (INDEPENDENT_AMBULATORY_CARE_PROVIDER_SITE_OTHER): Payer: Medicare Other | Admitting: Osteopathic Medicine

## 2018-10-14 ENCOUNTER — Encounter: Payer: Self-pay | Admitting: Osteopathic Medicine

## 2018-10-14 ENCOUNTER — Other Ambulatory Visit: Payer: Self-pay

## 2018-10-14 VITALS — BP 180/111 | HR 65 | Temp 98.0°F | Wt 191.9 lb

## 2018-10-14 DIAGNOSIS — I10 Essential (primary) hypertension: Secondary | ICD-10-CM | POA: Diagnosis not present

## 2018-10-14 DIAGNOSIS — Z9889 Other specified postprocedural states: Secondary | ICD-10-CM

## 2018-10-14 DIAGNOSIS — H9222 Otorrhagia, left ear: Secondary | ICD-10-CM

## 2018-10-14 LAB — POCT INR: INR: 3.1 — AB (ref 2.0–3.0)

## 2018-10-14 NOTE — Patient Instructions (Signed)
Plan:  Monitor the bleeding, I think we got it with the cautery but if you need to come back, please let us know (we can double-book you if no available appointments, or can go to ER or urgent care next door if it's after hours / weekend).   INR is ok for your condition, but with the blood thinners you are prone to continued oozing/bleeding. Follow-up as directed with your cardiologist!

## 2018-10-14 NOTE — Progress Notes (Signed)
HPI: Corey Mathis is a 68 y.o. male who  has a past medical history of Allergy, Anxiety, Asthma, CHF (congestive heart failure) ([place]), Dyslipidemia, History of mitral valve replacement, Hypertension, and Labyrinthitis.  he presents to [facility] today, 10/14/18,  for chief complaint of:  Bleeding from ear   . Context: was cleaning ear w/ qtip yesterday evening and this has been going on since then.  . Location: L ear  . Quality: bleeding, pain  . Duration/Timing: constant since 9:00 PM yesterday      At today's visit 10/14/18 ... PMH, PSH, FH reviewed and updated as needed.  Current medication list and allergy/intolerance hx reviewed and updated as needed. (See remainder of HPI, ROS, Phys Exam below)   No results found.  Results for orders placed or performed in visit on 10/14/18 (from the past 72 hour(s))  POCT INR     Status: Abnormal   Collection Time: 10/14/18 10:38 AM  Result Value Ref Range   INR 3.1 (A) 2.0 - 3.0          ASSESSMENT/PLAN: The primary encounter diagnosis was Ear bleeding, left. Diagnoses of MITRAL VALVE REPLACEMENT, HX OF and Essential hypertension were also pertinent to this visit.  BP Readings from Last 3 Encounters:  10/14/18 (!) 180/111  10/01/18 133/85  05/07/18 (!) 145/78   Ear lavage performed w/ sterile saline to revel intact TM and small laceration about 5:00 position distal ear canal. Tried applying pressure w/ gauze tamponade but still oozing, applied chemical cautery with silver nitrate.    Orders Placed This Encounter  Procedures  . POCT INR     No orders of the defined types were placed in this encounter.   Patient Instructions  Plan:  Monitor the bleeding, I think we got it with the cautery but if you need to come back, please let us know (we can double-book you if no available appointments, or can go to ER or urgent care next door if it's after hours / weekend).   INR is ok for  your condition, but with the blood thinners you are prone to continued oozing/bleeding. Follow-up as directed with your cardiologist!         Follow-up plan: Return if symptoms worsen or fail to improve.                                                 ################################################# ################################################# ################################################# #################################################    Current Meds  Medication Sig  . albuterol (PROAIR HFA) 108 (90 Base) MCG/ACT inhaler Inhale 2 puffs into the lungs every 6 (six) hours as needed for wheezing.  Marland Kitchen antiseptic oral rinse (BIOTENE) LIQD 5 mLs by Mouth Rinse route 2 (two) times daily.  Marland Kitchen aspirin 81 MG EC tablet Take 81 mg by mouth at bedtime.   . cholecalciferol (VITAMIN D) 1000 units tablet Take 1,000 Units by mouth at bedtime.   Marland Kitchen COENZYME Q10 PO Take 1 capsule by mouth at bedtime.   . fluticasone (FLONASE) 50 MCG/ACT nasal spray Place 1 spray into both nostrils daily as needed (congestion).   . Fluticasone-Salmeterol (ADVAIR DISKUS) 250-50 MCG/DOSE AEPB Inhale 1 puff into the lungs 2 (two) times daily.  . hydrALAZINE (APRESOLINE) 25 MG tablet Take 1 tablet (25 mg total) by mouth 3 (three) times daily.  . isosorbide mononitrate (IMDUR) 30  MG 24 hr tablet Take 1 tablet (30 mg total) by mouth daily.  . Melatonin 10 MG TABS Take 1 tablet by mouth as needed.  . metoprolol succinate (TOPROL-XL) 50 MG 24 hr tablet Take one tablet (50 mg) by mouth with a 100 mg tablet daily at bedtime - for a total dose of 150 mg  . Multiple Vitamin (MULTIVITAMIN WITH MINERALS) TABS tablet Take 1 tablet by mouth at bedtime.  Marland Kitchen PARoxetine (PAXIL) 20 MG tablet Take 1 tablet (20 mg total) by mouth daily. Due for follow up visit w/PCP  . warfarin (COUMADIN) 10 MG tablet Take 1 tablet (10 mg total) by mouth as directed. Take As Directed    Allergies   Allergen Reactions  . Lisinopril Anaphylaxis and Other (See Comments)    angioedema  . Carvedilol Other (See Comments)    wheezing  . Ezetimibe-Simvastatin Other (See Comments)    Serious vertigo  . Propranolol Hcl Other (See Comments)    wheezing  . Ramipril Other (See Comments)    wheezing  . Codeine Nausea Only       Review of Systems:  Constitutional: No recent illness  HEENT: See HPI  Cardiac: No  chest pain, No  pressure, No palpitations  Respiratory:  No  shortness of breath.   Gastrointestinal: No  abdominal pain,  Exam:  BP (!) 180/111 (BP Location: Right Arm, Patient Position: Sitting, Cuff Size: Normal)   Pulse 65   Temp 98 F (36.7 C) (Oral)   Wt 191 lb 14.4 oz (87 kg)   BMI 23.99 kg/m   Constitutional: VS see above. General Appearance: alert, well-developed, well-nourished, NAD  Eyes: Normal lids and conjunctive, non-icteric sclera  Ears: L TM intact, small oozing laceration - see A/P  Neck: No masses, trachea midline.   Respiratory: Normal respiratory effort.  Neurological: Normal balance/coordination. No tremor.  Skin: warm, dry, intact.   Psychiatric: Normal judgment/insight. Normal mood and affect. Oriented x3.       Visit summary with medication list and pertinent instructions was printed for patient to review, patient was advised to alert [place] if any updates are needed. All questions at time of visit were answered - patient instructed to contact office with any additional concerns. ER/RTC precautions were reviewed with the patient and understanding verbalized.   Note: Total time spent 25 minutes, greater than 50% of the visit was spent face-to-face counseling and coordinating care for the following: The encounter diagnosis was MITRAL VALVE REPLACEMENT, HX OF.Marland Kitchen  Please note: voice recognition software was used to produce this document, and typos may escape review. Please contact Dr. Sheppard Coil for any needed clarifications.    Follow up  plan: Return if symptoms worsen or fail to improve.

## 2018-10-21 ENCOUNTER — Other Ambulatory Visit: Payer: Self-pay

## 2018-10-21 ENCOUNTER — Ambulatory Visit (INDEPENDENT_AMBULATORY_CARE_PROVIDER_SITE_OTHER): Payer: Medicare Other | Admitting: *Deleted

## 2018-10-21 DIAGNOSIS — I712 Thoracic aortic aneurysm, without rupture: Secondary | ICD-10-CM | POA: Diagnosis not present

## 2018-10-21 DIAGNOSIS — Z7901 Long term (current) use of anticoagulants: Secondary | ICD-10-CM | POA: Diagnosis not present

## 2018-10-21 DIAGNOSIS — I059 Rheumatic mitral valve disease, unspecified: Secondary | ICD-10-CM | POA: Diagnosis not present

## 2018-10-21 DIAGNOSIS — Z9889 Other specified postprocedural states: Secondary | ICD-10-CM | POA: Diagnosis not present

## 2018-10-21 LAB — POCT INR: INR: 3.5 — AB (ref 2.0–3.0)

## 2018-10-21 NOTE — Patient Instructions (Signed)
Description   Continue taking 10mg  daily except 15mg  on Mondays and Fridays. Continue  normal greens intake. Recheck in 2 weeks. Call us with any new medications or concerns  # [phone]

## 2018-10-22 ENCOUNTER — Encounter: Payer: Self-pay | Admitting: *Deleted

## 2018-10-22 LAB — BASIC METABOLIC PANEL
BUN/Creatinine Ratio: 11 (ref 10–24)
BUN: 9 mg/dL (ref 8–27)
CO2: 28 mmol/L (ref 20–29)
Calcium: 9.7 mg/dL (ref 8.6–10.2)
Chloride: 96 mmol/L (ref 96–106)
Creatinine, Ser: 0.81 mg/dL (ref 0.76–1.27)
GFR calc Af Amer: 104 mL/min/{1.73_m2} (ref >59)
GFR calc non Af Amer: 90 mL/min/{1.73_m2} (ref >59)
Glucose: 80 mg/dL (ref 65–99)
Potassium: 4.5 mmol/L (ref 3.5–5.2)
Sodium: 137 mmol/L (ref 134–144)

## 2018-10-24 ENCOUNTER — Other Ambulatory Visit: Payer: Self-pay | Admitting: Family Medicine

## 2018-10-28 ENCOUNTER — Other Ambulatory Visit: Payer: Self-pay

## 2018-10-28 ENCOUNTER — Ambulatory Visit (INDEPENDENT_AMBULATORY_CARE_PROVIDER_SITE_OTHER)
Admission: RE | Admit: 2018-10-28 | Discharge: 2018-10-28 | Disposition: A | Discharge status: Home / Self Care | Payer: Medicare Other | Source: Ambulatory Visit | Attending: Cardiology | Admitting: Cardiology

## 2018-10-28 DIAGNOSIS — I712 Thoracic aortic aneurysm, without rupture, unspecified: Secondary | ICD-10-CM

## 2018-10-28 MED ORDER — IOHEXOL 350 MG/ML SOLN
100.0000 mL | Freq: Once | INTRAVENOUS | Status: AC | PRN
  Administered 2018-10-28: 100 mL via INTRAVENOUS

## 2018-10-29 ENCOUNTER — Telehealth: Payer: Self-pay | Admitting: *Deleted

## 2018-10-29 DIAGNOSIS — R911 Solitary pulmonary nodule: Secondary | ICD-10-CM

## 2018-10-29 NOTE — Telephone Encounter (Addendum)
-----   Message from Lelon Perla, MD sent at 10/28/2018 10:49 AM EDT ----- FU noncontrast chest CT 3 months; fu CTA of thoracic aorta 1 year [place] with pt, order placed for ct scan and scheduled at [address] in 3 months.

## 2018-10-31 ENCOUNTER — Encounter: Payer: Self-pay | Admitting: Family Medicine

## 2018-10-31 ENCOUNTER — Other Ambulatory Visit: Payer: Self-pay

## 2018-10-31 ENCOUNTER — Ambulatory Visit (INDEPENDENT_AMBULATORY_CARE_PROVIDER_SITE_OTHER): Payer: Medicare Other | Admitting: Family Medicine

## 2018-10-31 VITALS — BP 136/78 | HR 67 | Temp 98.4°F | Ht 75.0 in | Wt 191.0 lb

## 2018-10-31 DIAGNOSIS — Z125 Encounter for screening for malignant neoplasm of prostate: Secondary | ICD-10-CM

## 2018-10-31 DIAGNOSIS — D171 Benign lipomatous neoplasm of skin and subcutaneous tissue of trunk: Secondary | ICD-10-CM

## 2018-10-31 DIAGNOSIS — I10 Essential (primary) hypertension: Secondary | ICD-10-CM | POA: Diagnosis not present

## 2018-10-31 DIAGNOSIS — Z87898 Personal history of other specified conditions: Secondary | ICD-10-CM | POA: Diagnosis not present

## 2018-10-31 DIAGNOSIS — Z23 Encounter for immunization: Secondary | ICD-10-CM | POA: Diagnosis not present

## 2018-10-31 DIAGNOSIS — L720 Epidermal cyst: Secondary | ICD-10-CM | POA: Diagnosis not present

## 2018-10-31 DIAGNOSIS — R7301 Impaired fasting glucose: Secondary | ICD-10-CM

## 2018-10-31 LAB — POCT GLYCOSYLATED HEMOGLOBIN (HGB A1C): Hemoglobin A1C: 5.6 % (ref 4.0–5.6)

## 2018-10-31 MED ORDER — AMBULATORY NON FORMULARY MEDICATION: 0 refills | Status: DC

## 2018-10-31 NOTE — Progress Notes (Signed)
Established Patient Office Visit  Subjective:  Patient ID: Corey Mathis, male    DOB: [birth year]  Age: 68 y.o. MRN: [id number]  CC:  Chief Complaint  Patient presents with  . Hypertension  . ifg    HPI ADARYLL BANTA presents for   Impaired fasting glucose-no increased thirst or urination. No symptoms consistent with hypoglycemia.  Hypertension- Pt denies chest pain, SOB, dizziness, or heart palpitations.  Taking meds as directed w/o problems.  Denies medication side effects.    He also recently had a chest CT angiogram with Dr. Kirk Ruths on October 28, 2018.  It did show nodular opacity in the posterior segment of the right upper lobe measuring approximately 8 x 6 mm.  Evidently it was a little bit larger compared to prior studies they have recommended follow-up CT in 3 months.  That order has been placed for end of November.  He did want to discuss that today.  He also has an epidermal cyst on his right upper anterior chest he would like me to look at today.  He is concerned because it is getting larger.  It is not been painful or bothersome unless he hits it or bumps it.  He also wants me to look a mass on his left upper back.  In regards to mood/mild depression-he is actually doing well on the Paxil.  He says when he keeps busy he does pretty well it is more when he is by himself.  He is happy with his regimen and denies any significant side effects and would like to continue with that.    Past Medical History:  Diagnosis Date  . Allergy   . Anxiety   . Asthma   . CHF (congestive heart failure) ([place])   . Dyslipidemia   . History of mitral valve replacement   . Hypertension   . Labyrinthitis    hx    Past Surgical History:  Procedure Laterality Date  . ACNE CYST REMOVAL     from hand and back  . EXTERNAL FIXATION LEG Right 08/11/2017   Procedure: EXTERNAL FIXATION, RIGHT KNEE;  Surgeon: Leandrew Koyanagi, MD;  Location: [place];  Service: Orthopedics;  Laterality:  Right;  . FINE NEEDLE ASPIRATION Right 08/11/2017   Procedure: FINE NEEDLE ASPIRATION  of right Knee with 80 cc of dark red fluid removed;  Surgeon: Leandrew Koyanagi, MD;  Location: [place];  Service: Orthopedics;  Laterality: Right;  . FLEXIBLE SIGMOIDOSCOPY N/A 02/11/2018   Procedure: FLEXIBLE SIGMOIDOSCOPY;  Surgeon: Doran Stabler, MD;  Location: [place];  Service: Gastroenterology;  Laterality: N/A;  . Jaw surgery (other)    . NASAL SINUS SURGERY N/A 10/16/2012   Procedure: NASAL ENDOSCOPIC POLYPECTOMY/MAXILLARY ANTROSTOMY/ETHMOIDECTOMY;  Surgeon: Izora Gala, MD;  Location: [place];  Service: ENT;  Laterality: N/A;  . ORIF TIBIA PLATEAU Left 08/13/2017   Procedure: OPEN REDUCTION INTERNAL FIXATION (ORIF) TIBIAL PLATEAU;  Surgeon: Leandrew Koyanagi, MD;  Location: [place];  Service: Orthopedics;  Laterality: Left;  . ORIF TIBIA PLATEAU Right 08/22/2017   Procedure: OPEN REDUCTION INTERNAL FIXATION (ORIF) RIGHT BICONDYLAR TIBIAL PLATEAU, REMOVAL OF EX FIX;  Surgeon: Leandrew Koyanagi, MD;  Location: [place];  Service: Orthopedics;  Laterality: Right;  . RIGHT/LEFT HEART CATH AND CORONARY ANGIOGRAPHY N/A 07/12/2017   Procedure: RIGHT/LEFT HEART CATH AND CORONARY ANGIOGRAPHY;  Surgeon: Burnell Blanks, MD;  Location: [place] [facility];  Service: Cardiovascular;  Laterality: N/A;  . SUBMUCOSAL INJECTION  02/11/2018   Procedure: SUBMUCOSAL INJECTION;  Surgeon: Doran Stabler, MD;  Location: [place];  Service: Gastroenterology;;  . TOOTH EXTRACTION    . VALVE REPLACEMENT  1998   [employer], mitral    Family History  Problem Relation Age of Onset  . Heart attack Father 67  . Colon cancer Mother 27  . Hypertension Mother   . Stomach cancer Neg Hx   . Esophageal cancer Neg Hx   . Inflammatory bowel disease Neg Hx   . Liver disease Neg Hx   . Pancreatic cancer Neg Hx     Social History   Socioeconomic History  . Marital status: Married    Spouse name: Frenchie   . Number of children: 2   . Years of education: Not on file  . Highest education level: Not on file  Occupational History  . Occupation: retired    Fish farm manager: [employer]  Social Needs  . Financial resource strain: Not on file  . Food insecurity    Worry: Not on file    Inability: Not on file  . Transportation needs    Medical: Not on file    Non-medical: Not on file  Tobacco Use  . Smoking status: Never Smoker  . Smokeless tobacco: Never Used  Substance and Sexual Activity  . Alcohol use: Never    Frequency: Never  . Drug use: Never  . Sexual activity: Not Currently  Lifestyle  . Physical activity    Days per week: Not on file    Minutes per session: Not on file  . Stress: Not on file  Relationships  . Social Herbalist on phone: Not on file    Gets together: Not on file    Attends religious service: Not on file    Active member of club or organization: Not on file    Attends meetings of clubs or organizations: Not on file    Relationship status: Not on file  . Intimate partner violence    Fear of current or ex partner: Not on file    Emotionally abused: Not on file    Physically abused: Not on file    Forced sexual activity: Not on file  Other Topics Concern  . Not on file  Social History Narrative   ** Merged History Encounter **       Some exercise, bike and walking.  2 cups per day.     Outpatient Medications Prior to Visit  Medication Sig Dispense Refill  . albuterol (PROAIR HFA) 108 (90 Base) MCG/ACT inhaler Inhale 2 puffs into the lungs every 6 (six) hours as needed for wheezing. 3 Inhaler 2  . antiseptic oral rinse (BIOTENE) LIQD 5 mLs by Mouth Rinse route 2 (two) times daily.    Marland Kitchen aspirin 81 MG EC tablet Take 81 mg by mouth at bedtime.     . cholecalciferol (VITAMIN D) 1000 units tablet Take 1,000 Units by mouth at bedtime.     Marland Kitchen COENZYME Q10 PO Take 1 capsule by mouth at bedtime.     . fluticasone (FLONASE) 50 MCG/ACT nasal spray Place 1 spray into both nostrils  daily as needed (congestion).     . Fluticasone-Salmeterol (ADVAIR DISKUS) 250-50 MCG/DOSE AEPB Inhale 1 puff into the lungs 2 (two) times daily. 60 each 1  . hydrALAZINE (APRESOLINE) 25 MG tablet Take 1 tablet (25 mg total) by mouth 3 (three) times daily. 270 tablet 3  . isosorbide mononitrate (IMDUR) 30 MG  24 hr tablet Take 1 tablet (30 mg total) by mouth daily. 90 tablet 3  . Melatonin 10 MG TABS Take 1 tablet by mouth as needed.    . metoprolol succinate (TOPROL-XL) 50 MG 24 hr tablet Take one tablet (50 mg) by mouth with a 100 mg tablet daily at bedtime - for a total dose of 150 mg 90 tablet 1  . Multiple Vitamin (MULTIVITAMIN WITH MINERALS) TABS tablet Take 1 tablet by mouth at bedtime.    Marland Kitchen PARoxetine (PAXIL) 20 MG tablet TAKE 1 TABLET BY MOUTH ONCE DAILY . APPOINTMENT REQUIRED FOR FUTURE REFILLS 30 tablet 0  . warfarin (COUMADIN) 10 MG tablet Take 1 tablet (10 mg total) by mouth as directed. Take As Directed 120 tablet 0   No facility-administered medications prior to visit.     Allergies  Allergen Reactions  . Lisinopril Anaphylaxis and Other (See Comments)    angioedema  . Carvedilol Other (See Comments)    wheezing  . Ezetimibe-Simvastatin Other (See Comments)    Serious vertigo  . Propranolol Hcl Other (See Comments)    wheezing  . Ramipril Other (See Comments)    wheezing  . Codeine Nausea Only    ROS Review of Systems    Objective:    Physical Exam  Constitutional: He is oriented to person, place, and time. He appears well-developed and well-nourished.  HENT:  Head: Normocephalic and atraumatic.  Cardiovascular: Normal rate, regular rhythm and normal heart sounds.  Radial pulse 2+ bilaterally.  Pulmonary/Chest: Effort normal and breath sounds normal.  Neurological: He is alert and oriented to person, place, and time.  Skin: Skin is warm and dry.  Fairly large oval-shaped epidermal cyst approximately 5 x 6 mm in size on the right upper anterior chest.  On his  left upper back he has a fairly large lipoma measuring approximately 15 cm in size.  Psychiatric: He has a normal mood and affect. His behavior is normal.    BP 136/78   Pulse 67   Temp 98.4 F (36.9 C)   Ht 6\' 3"  (1.905 m)   Wt 191 lb (86.6 kg)   SpO2 99%   BMI 23.87 kg/m  Wt Readings from Last 3 Encounters:  10/31/18 191 lb (86.6 kg)  10/14/18 191 lb 14.4 oz (87 kg)  10/01/18 185 lb (83.9 kg)     Health Maintenance Due  Topic Date Due  . INFLUENZA VACCINE  10/05/2018    There are no preventive care reminders to display for this patient.  Lab Results  Component Value Date   TSH 1.14 07/27/2016   Lab Results  Component Value Date   WBC 5.5 05/07/2018   HGB 15.0 05/07/2018   HCT 44.9 05/07/2018   MCV 86.3 05/07/2018   PLT 231 05/07/2018   Lab Results  Component Value Date   NA 137 10/21/2018   K 4.5 10/21/2018   CO2 28 10/21/2018   GLUCOSE 80 10/21/2018   BUN 9 10/21/2018   CREATININE 0.81 10/21/2018   BILITOT 0.7 11/01/2017   ALKPHOS 72 08/10/2017   AST 23 11/01/2017   ALT 11 11/01/2017   PROT 7.9 11/01/2017   ALBUMIN 3.9 08/10/2017   CALCIUM 9.7 10/21/2018   ANIONGAP 9 02/13/2018   GFR 96.14 05/28/2014   Lab Results  Component Value Date   CHOL 206 (H) 11/01/2017   Lab Results  Component Value Date   HDL 41 11/01/2017   Lab Results  Component Value Date   LDLCALC 138 (  H) 11/01/2017   Lab Results  Component Value Date   TRIG 147 11/01/2017   Lab Results  Component Value Date   CHOLHDL 5.0 (H) 11/01/2017   Lab Results  Component Value Date   HGBA1C 5.6 10/31/2018      Assessment & Plan:   Problem List Items Addressed This Visit      Cardiovascular and Mediastinum   Essential hypertension    Well controlled. Continue current regimen. Follow up in  6 months.        Relevant Orders   PSA   Lipid panel     Endocrine   IFG (impaired fasting glucose) - Primary    Well controlled. Continue current regimen. Follow up in  6  months.        Relevant Orders   POCT glycosylated hemoglobin (Hb A1C) (Completed)   PSA   Lipid panel    Other Visit Diagnoses    Need for immunization against influenza       Relevant Orders   Flu Vaccine QUAD High Dose(Fluad) (Completed)   Epidermal cyst       Lipoma of torso       Screening for prostate cancer       History of elevated PSA       Relevant Orders   PSA   Prostate cancer screening       Relevant Orders   PSA     Lipoma-discussed benign nature of this lesion and gave reassurance.  Discussed possible removal he would prefer to do that so we will try to get him scheduled.  He should not have to come off of his Coumadin for that.  Epidermal cyst-discussed nature of this lesion.  Patient with provider to have it excised because he does feel like it is been getting larger.  Meds ordered this encounter  Medications  . AMBULATORY NON FORMULARY MEDICATION    Sig: Medication Name: Shingles IM x 1    Dispense:  1 vial    Refill:  0    Follow-up: Return for Please schedule with Dr. Aundria Mems for lipoma removal on his back.  he is on coumadin.    Beatrice Lecher, MD

## 2018-10-31 NOTE — Assessment & Plan Note (Signed)
Well controlled. Continue current regimen. Follow up in  6 months.  

## 2018-11-01 ENCOUNTER — Encounter: Payer: Self-pay | Admitting: Sports Medicine

## 2018-11-01 ENCOUNTER — Other Ambulatory Visit: Payer: Self-pay

## 2018-11-01 ENCOUNTER — Ambulatory Visit (INDEPENDENT_AMBULATORY_CARE_PROVIDER_SITE_OTHER): Payer: Medicare Other | Admitting: Sports Medicine

## 2018-11-01 ENCOUNTER — Other Ambulatory Visit: Payer: Self-pay | Admitting: Family Medicine

## 2018-11-01 DIAGNOSIS — D171 Benign lipomatous neoplasm of skin and subcutaneous tissue of trunk: Secondary | ICD-10-CM

## 2018-11-01 MED ORDER — HYDROCODONE-ACETAMINOPHEN 5-325 MG PO TABS: 1.0000 | ORAL_TABLET | Freq: Three times a day (TID) | ORAL | 0 refills | Status: DC | PRN

## 2018-11-01 NOTE — Assessment & Plan Note (Signed)
Surgical excision of a 6 cm lipoma, 3 layer closure. Hydrocodone for postoperative pain. Return to see me to remove lipoma #2 on his low back and he also has a large sebaceous cyst on his chest, we will do these consecutively at different office visits.

## 2018-11-01 NOTE — Progress Notes (Signed)
Subjective:    CC: Mass on back and chest  HPI: This is a very pleasant 68 year old male, for many years he is noted to very large masses on his back and one in his right upper chest.  They cause significant discomfort, he has difficulty laying on his back or his chest when sleeping.  Symptoms are moderate, persistent.  Symptoms are localized without radiation.  I reviewed the past medical history, family history, social history, surgical history, and allergies today and no changes were needed.  Please see the problem list section below in epic for further details.  Past Medical History: Past Medical History:  Diagnosis Date  . Allergy   . Anxiety   . Asthma   . CHF (congestive heart failure) ([place])   . Dyslipidemia   . History of mitral valve replacement   . Hypertension   . Labyrinthitis    hx   Past Surgical History: Past Surgical History:  Procedure Laterality Date  . ACNE CYST REMOVAL     from hand and back  . EXTERNAL FIXATION LEG Right 08/11/2017   Procedure: EXTERNAL FIXATION, RIGHT KNEE;  Surgeon: Leandrew Koyanagi, MD;  Location: [place];  Service: Orthopedics;  Laterality: Right;  . FINE NEEDLE ASPIRATION Right 08/11/2017   Procedure: FINE NEEDLE ASPIRATION  of right Knee with 80 cc of dark red fluid removed;  Surgeon: Leandrew Koyanagi, MD;  Location: [place];  Service: Orthopedics;  Laterality: Right;  . FLEXIBLE SIGMOIDOSCOPY N/A 02/11/2018   Procedure: FLEXIBLE SIGMOIDOSCOPY;  Surgeon: Doran Stabler, MD;  Location: [place];  Service: Gastroenterology;  Laterality: N/A;  . Jaw surgery (other)    . NASAL SINUS SURGERY N/A 10/16/2012   Procedure: NASAL ENDOSCOPIC POLYPECTOMY/MAXILLARY ANTROSTOMY/ETHMOIDECTOMY;  Surgeon: Izora Gala, MD;  Location: [place];  Service: ENT;  Laterality: N/A;  . ORIF TIBIA PLATEAU Left 08/13/2017   Procedure: OPEN REDUCTION INTERNAL FIXATION (ORIF) TIBIAL PLATEAU;  Surgeon: Leandrew Koyanagi, MD;  Location: [place];  Service: Orthopedics;  Laterality:  Left;  . ORIF TIBIA PLATEAU Right 08/22/2017   Procedure: OPEN REDUCTION INTERNAL FIXATION (ORIF) RIGHT BICONDYLAR TIBIAL PLATEAU, REMOVAL OF EX FIX;  Surgeon: Leandrew Koyanagi, MD;  Location: [place];  Service: Orthopedics;  Laterality: Right;  . RIGHT/LEFT HEART CATH AND CORONARY ANGIOGRAPHY N/A 07/12/2017   Procedure: RIGHT/LEFT HEART CATH AND CORONARY ANGIOGRAPHY;  Surgeon: Burnell Blanks, MD;  Location: [place] [facility];  Service: Cardiovascular;  Laterality: N/A;  . SUBMUCOSAL INJECTION  02/11/2018   Procedure: SUBMUCOSAL INJECTION;  Surgeon: Doran Stabler, MD;  Location: [place];  Service: Gastroenterology;;  . TOOTH EXTRACTION    . VALVE REPLACEMENT  1998   St. Jude, mitral   Social History: Social History   Socioeconomic History  . Marital status: Married    Spouse name: Frenchie   . Number of children: 2  . Years of education: Not on file  . Highest education level: Not on file  Occupational History  . Occupation: retired    Fish farm manager: [employer]  Social Needs  . Financial resource strain: Not on file  . Food insecurity    Worry: Not on file    Inability: Not on file  . Transportation needs    Medical: Not on file    Non-medical: Not on file  Tobacco Use  . Smoking status: Never Smoker  . Smokeless tobacco: Never Used  Substance and Sexual Activity  . Alcohol use: Never    Frequency: Never  .  Drug use: Never  . Sexual activity: Not Currently  Lifestyle  . Physical activity    Days per week: Not on file    Minutes per session: Not on file  . Stress: Not on file  Relationships  . Social Herbalist on phone: Not on file    Gets together: Not on file    Attends religious service: Not on file    Active member of club or organization: Not on file    Attends meetings of clubs or organizations: Not on file    Relationship status: Not on file  Other Topics Concern  . Not on file  Social History Narrative   ** Merged History Encounter **        Some exercise, bike and walking.  2 cups per day.    Family History: Family History  Problem Relation Age of Onset  . Heart attack Father 50  . Colon cancer Mother 22  . Hypertension Mother   . Stomach cancer Neg Hx   . Esophageal cancer Neg Hx   . Inflammatory bowel disease Neg Hx   . Liver disease Neg Hx   . Pancreatic cancer Neg Hx    Allergies: Allergies  Allergen Reactions  . Lisinopril Anaphylaxis and Other (See Comments)    angioedema  . Carvedilol Other (See Comments)    wheezing  . Ezetimibe-Simvastatin Other (See Comments)    Serious vertigo  . Propranolol Hcl Other (See Comments)    wheezing  . Ramipril Other (See Comments)    wheezing  . Codeine Nausea Only   Medications: See med rec.  Review of Systems: No fevers, chills, night sweats, weight loss, chest pain, or shortness of breath.   Objective:    General: Well Developed, well nourished, and in no acute distress.  Neuro: Alert and oriented x3, extra-ocular muscles intact, sensation grossly intact.  HEENT: Normocephalic, atraumatic, pupils equal round reactive to light, neck supple, no masses, no lymphadenopathy, thyroid nonpalpable.  Skin: Warm and dry, no rashes.  Large subcutaneous mass on the left upper back, there is a slightly smaller subcutaneous mass just distal and lateral to this.  He also has a large subcutaneous mass in his right upper chest. Cardiac: Regular rate and rhythm, no murmurs rubs or gallops, no lower extremity edema.  Respiratory: Clear to auscultation bilaterally. Not using accessory muscles, speaking in full sentences.  Procedure:  Excision of 6 cm left upper back subcutaneous tumor Risks, benefits, and alternatives explained and consent obtained. Time out conducted. Surface prepped with alcohol. 15cc lidocaine with epinephine infiltrated in a field block. Adequate anesthesia ensured. Area prepped and draped in a sterile fashion. Excision performed with: Using a #10 blade I  made a linear incision over the tumor.  I use electrocautery to achieve hemostasis while dissecting down to the tumor itself.  Then using both sharp and blunt dissection I was able to encircle what appeared to be a large subcutaneous lipoma, and remove it en bloc.  I then used a multilayer approach to close the incision starting with 3 sutures, 3-0 Vicryl to approximate the fascia.  I then used for 3-0 Vicryl sutures in a deep dermal fashion, and then a running subcuticular 4-0 Vicryl to close the skin.  A pressure dressing was applied after application of Steri-Strips. Estimated blood loss was minimal, may be 4 or 5 mL. Hemostasis achieved. Pt stable.  Impression and Recommendations:    Lipoma of back Surgical excision of a 6 cm  lipoma, 3 layer closure. Hydrocodone for postoperative pain. Return to see me to remove lipoma #2 on his low back and he also has a large sebaceous cyst on his chest, we will do these consecutively at different office visits.   ___________________________________________ Gwen Her. Dianah Field, M.D., ABFM., CAQSM. Primary Care and Sports Medicine [facility]  Adjunct Professor of [place] [employer]

## 2018-11-04 ENCOUNTER — Ambulatory Visit (INDEPENDENT_AMBULATORY_CARE_PROVIDER_SITE_OTHER): Payer: Medicare Other | Admitting: *Deleted

## 2018-11-04 ENCOUNTER — Other Ambulatory Visit: Payer: Self-pay

## 2018-11-04 DIAGNOSIS — Z9889 Other specified postprocedural states: Secondary | ICD-10-CM | POA: Diagnosis not present

## 2018-11-04 DIAGNOSIS — Z7901 Long term (current) use of anticoagulants: Secondary | ICD-10-CM

## 2018-11-04 DIAGNOSIS — I059 Rheumatic mitral valve disease, unspecified: Secondary | ICD-10-CM

## 2018-11-04 LAB — POCT INR: INR: 5.4 — AB (ref 2.0–3.0)

## 2018-11-04 NOTE — Patient Instructions (Addendum)
Description   Skip Tuesday and Wednesday's dose, then Continue taking 10mg  daily except 15mg  on Mondays and Fridays. Continue  normal greens intake. Recheck in 10 days  Call us with any new medications or concerns  # [phone]

## 2018-11-15 ENCOUNTER — Other Ambulatory Visit: Payer: Self-pay

## 2018-11-15 ENCOUNTER — Ambulatory Visit (INDEPENDENT_AMBULATORY_CARE_PROVIDER_SITE_OTHER): Payer: Medicare Other | Admitting: Pharmacist

## 2018-11-15 ENCOUNTER — Ambulatory Visit (INDEPENDENT_AMBULATORY_CARE_PROVIDER_SITE_OTHER): Payer: Medicare Other | Admitting: Sports Medicine

## 2018-11-15 VITALS — BP 169/81 | HR 67 | Temp 97.8°F | Wt 192.0 lb

## 2018-11-15 DIAGNOSIS — N6320 Unspecified lump in the left breast, unspecified quadrant: Secondary | ICD-10-CM | POA: Insufficient documentation

## 2018-11-15 DIAGNOSIS — R7301 Impaired fasting glucose: Secondary | ICD-10-CM | POA: Diagnosis not present

## 2018-11-15 DIAGNOSIS — D171 Benign lipomatous neoplasm of skin and subcutaneous tissue of trunk: Secondary | ICD-10-CM | POA: Diagnosis not present

## 2018-11-15 DIAGNOSIS — R222 Localized swelling, mass and lump, trunk: Secondary | ICD-10-CM

## 2018-11-15 DIAGNOSIS — I059 Rheumatic mitral valve disease, unspecified: Secondary | ICD-10-CM

## 2018-11-15 DIAGNOSIS — Z125 Encounter for screening for malignant neoplasm of prostate: Secondary | ICD-10-CM | POA: Diagnosis not present

## 2018-11-15 DIAGNOSIS — Z9889 Other specified postprocedural states: Secondary | ICD-10-CM

## 2018-11-15 DIAGNOSIS — Z7901 Long term (current) use of anticoagulants: Secondary | ICD-10-CM | POA: Diagnosis not present

## 2018-11-15 DIAGNOSIS — I10 Essential (primary) hypertension: Secondary | ICD-10-CM | POA: Diagnosis not present

## 2018-11-15 DIAGNOSIS — Z87898 Personal history of other specified conditions: Secondary | ICD-10-CM | POA: Diagnosis not present

## 2018-11-15 LAB — POCT INR: INR: 2.2 (ref 2.0–3.0)

## 2018-11-15 MED ORDER — LIDOCAINE HCL 1 % IJ SOLN: 15.0000 mL | Freq: Once | INTRAMUSCULAR | Status: DC

## 2018-11-15 NOTE — Assessment & Plan Note (Signed)
There is a mass in the left breast, approximately 2 cm from the nipple at the 3 o'clock position. Ordering ultrasound and mammogram.

## 2018-11-15 NOTE — Progress Notes (Signed)
Subjective:    CC: Skin lesions  HPI: Corey Mathis returns, he is a pleasant 68 year old male, he has another skin lesion on his left flank/back.  We removed a large left upper back lipoma at the last visit.  It has developed somewhat of a hematoma.  Symptoms are moderate, persistent, localized without radiation.  I reviewed the past medical history, family history, social history, surgical history, and allergies today and no changes were needed.  Please see the problem list section below in epic for further details.  Past Medical History: Past Medical History:  Diagnosis Date  . Allergy   . Anxiety   . Asthma   . CHF (congestive heart failure) ([place])   . Dyslipidemia   . History of mitral valve replacement   . Hypertension   . Labyrinthitis    hx   Past Surgical History: Past Surgical History:  Procedure Laterality Date  . ACNE CYST REMOVAL     from hand and back  . EXTERNAL FIXATION LEG Right 08/11/2017   Procedure: EXTERNAL FIXATION, RIGHT KNEE;  Surgeon: Leandrew Koyanagi, MD;  Location: [place];  Service: Orthopedics;  Laterality: Right;  . FINE NEEDLE ASPIRATION Right 08/11/2017   Procedure: FINE NEEDLE ASPIRATION  of right Knee with 80 cc of dark red fluid removed;  Surgeon: Leandrew Koyanagi, MD;  Location: [place];  Service: Orthopedics;  Laterality: Right;  . FLEXIBLE SIGMOIDOSCOPY N/A 02/11/2018   Procedure: FLEXIBLE SIGMOIDOSCOPY;  Surgeon: Doran Stabler, MD;  Location: [place];  Service: Gastroenterology;  Laterality: N/A;  . Jaw surgery (other)    . NASAL SINUS SURGERY N/A 10/16/2012   Procedure: NASAL ENDOSCOPIC POLYPECTOMY/MAXILLARY ANTROSTOMY/ETHMOIDECTOMY;  Surgeon: Izora Gala, MD;  Location: [place];  Service: ENT;  Laterality: N/A;  . ORIF TIBIA PLATEAU Left 08/13/2017   Procedure: OPEN REDUCTION INTERNAL FIXATION (ORIF) TIBIAL PLATEAU;  Surgeon: Leandrew Koyanagi, MD;  Location: [place];  Service: Orthopedics;  Laterality: Left;  . ORIF TIBIA PLATEAU Right 08/22/2017   Procedure: OPEN REDUCTION INTERNAL FIXATION (ORIF) RIGHT BICONDYLAR TIBIAL PLATEAU, REMOVAL OF EX FIX;  Surgeon: Leandrew Koyanagi, MD;  Location: [place];  Service: Orthopedics;  Laterality: Right;  . RIGHT/LEFT HEART CATH AND CORONARY ANGIOGRAPHY N/A 07/12/2017   Procedure: RIGHT/LEFT HEART CATH AND CORONARY ANGIOGRAPHY;  Surgeon: Burnell Blanks, MD;  Location: [place] [facility];  Service: Cardiovascular;  Laterality: N/A;  . SUBMUCOSAL INJECTION  02/11/2018   Procedure: SUBMUCOSAL INJECTION;  Surgeon: Doran Stabler, MD;  Location: [place];  Service: Gastroenterology;;  . TOOTH EXTRACTION    . VALVE REPLACEMENT  1998   St. Jude, mitral   Social History: Social History   Socioeconomic History  . Marital status: Married    Spouse name: Frenchie   . Number of children: 2  . Years of education: Not on file  . Highest education level: Not on file  Occupational History  . Occupation: retired    Fish farm manager: [employer]  Social Needs  . Financial resource strain: Not on file  . Food insecurity    Worry: Not on file    Inability: Not on file  . Transportation needs    Medical: Not on file    Non-medical: Not on file  Tobacco Use  . Smoking status: Never Smoker  . Smokeless tobacco: Never Used  Substance and Sexual Activity  . Alcohol use: Never    Frequency: Never  . Drug use: Never  . Sexual activity: Not Currently  Lifestyle  .  Physical activity    Days per week: Not on file    Minutes per session: Not on file  . Stress: Not on file  Relationships  . Social Herbalist on phone: Not on file    Gets together: Not on file    Attends religious service: Not on file    Active member of club or organization: Not on file    Attends meetings of clubs or organizations: Not on file    Relationship status: Not on file  Other Topics Concern  . Not on file  Social History Narrative   ** Merged History Encounter **       Some exercise, bike and walking.  2 cups  per day.    Family History: Family History  Problem Relation Age of Onset  . Heart attack Father 51  . Colon cancer Mother 66  . Hypertension Mother   . Stomach cancer Neg Hx   . Esophageal cancer Neg Hx   . Inflammatory bowel disease Neg Hx   . Liver disease Neg Hx   . Pancreatic cancer Neg Hx    Allergies: Allergies  Allergen Reactions  . Lisinopril Anaphylaxis and Other (See Comments)    angioedema  . Carvedilol Other (See Comments)    wheezing  . Ezetimibe-Simvastatin Other (See Comments)    Serious vertigo  . Propranolol Hcl Other (See Comments)    wheezing  . Ramipril Other (See Comments)    wheezing  . Codeine Nausea Only   Medications: See med rec.  Review of Systems: No fevers, chills, night sweats, weight loss, chest pain, or shortness of breath.   Objective:    General: Well Developed, well nourished, and in no acute distress.  Neuro: Alert and oriented x3, extra-ocular muscles intact, sensation grossly intact.  HEENT: Normocephalic, atraumatic, pupils equal round reactive to light, neck supple, no masses, no lymphadenopathy, thyroid nonpalpable.  Skin: Warm and dry, no rashes. Cardiac: Regular rate and rhythm, no murmurs rubs or gallops, no lower extremity edema.  Respiratory: Clear to auscultation bilaterally. Not using accessory muscles, speaking in full sentences. Upper back: Previous incision is clean, dry, intact, there is a palpable hematoma.  There is also a well-defined 4 cm subcutaneous mass just distal to the previous incision.  Procedure:  Excision of left-sided back subcutaneous tumor.  4 cm Risks, benefits, and alternatives explained and consent obtained. Time out conducted. Surface prepped with alcohol. 15cc lidocaine with epinephine infiltrated in a field block. Adequate anesthesia ensured. Area prepped and draped in a sterile fashion. Excision performed with: Using a #10 blade and made an elliptical incision, then using sharp and blunt  dissection I removed a large 4 cm subcutaneous tumor as a whole, it appeared to be an epidermal inclusion cyst.  I then closed the incision with 2, 3-0 deep dermal sutures followed by a 3-0 running subcuticular stitch to close the skin.  Steri-Strips were applied to reduce skin tension.  I did attempt a puncture into the old hematoma, but no hematoma was able to be expressed. Hemostasis achieved. Pressure dressing applied. Pt stable.  Impression and Recommendations:    Lipoma of back Hematoma did develop, otherwise incision looks great. We attempted to evacuate the hematoma without avail. We can just watch this, and he can use heating pad and massage.  Mass on back Surgical excision of a large subcutaneous mass with primary closure. Return to see me in 2 weeks to reevaluate. He does have another one on  his chest.  Mass of breast, left There is a mass in the left breast, approximately 2 cm from the nipple at the 3 o'clock position. Ordering ultrasound and mammogram.   ___________________________________________ Gwen Her. Dianah Field, M.D., ABFM., CAQSM. Primary Care and Sports Medicine [facility]  Adjunct Professor of [place] [employer] [employer]

## 2018-11-15 NOTE — Assessment & Plan Note (Signed)
Surgical excision of a large subcutaneous mass with primary closure. Return to see me in 2 weeks to reevaluate. He does have another one on his chest.

## 2018-11-15 NOTE — Assessment & Plan Note (Signed)
Hematoma did develop, otherwise incision looks great. We attempted to evacuate the hematoma without avail. We can just watch this, and he can use heating pad and massage.

## 2018-11-16 LAB — LIPID PANEL
Cholesterol: 211 mg/dL — ABNORMAL HIGH (ref <200)
HDL: 42 mg/dL (ref >=40)
LDL Cholesterol (Calc): 144 mg/dL (calc) — ABNORMAL HIGH
Non-HDL Cholesterol (Calc): 169 mg/dL (calc) — ABNORMAL HIGH (ref <130)
Total CHOL/HDL Ratio: 5 (calc) — ABNORMAL HIGH (ref <5.0)
Triglycerides: 129 mg/dL (ref <150)

## 2018-11-16 LAB — PSA: PSA: 1 ng/mL (ref <=4.0)

## 2018-11-18 ENCOUNTER — Other Ambulatory Visit: Payer: Self-pay | Admitting: Family Medicine

## 2018-11-18 MED ORDER — ATORVASTATIN CALCIUM 10 MG PO TABS: 10.0000 mg | ORAL_TABLET | Freq: Every day | ORAL | 1 refills | Status: DC

## 2018-11-18 NOTE — Progress Notes (Signed)
OK , new rx sent for statin.

## 2018-11-25 ENCOUNTER — Other Ambulatory Visit: Payer: Self-pay

## 2018-11-25 ENCOUNTER — Ambulatory Visit
Admission: EM | Admit: 2018-11-25 | Discharge: 2018-11-25 | Disposition: A | Discharge status: Home / Self Care | Payer: Medicare Other | Attending: Emergency Medicine | Admitting: Emergency Medicine

## 2018-11-25 ENCOUNTER — Encounter: Payer: Self-pay | Admitting: Emergency Medicine

## 2018-11-25 ENCOUNTER — Ambulatory Visit (INDEPENDENT_AMBULATORY_CARE_PROVIDER_SITE_OTHER): Payer: Medicare Other | Admitting: Sports Medicine

## 2018-11-25 ENCOUNTER — Encounter: Payer: Self-pay | Admitting: Sports Medicine

## 2018-11-25 DIAGNOSIS — L7622 Postprocedural hemorrhage and hematoma of skin and subcutaneous tissue following other procedure: Secondary | ICD-10-CM

## 2018-11-25 DIAGNOSIS — I10 Essential (primary) hypertension: Secondary | ICD-10-CM | POA: Diagnosis not present

## 2018-11-25 DIAGNOSIS — R222 Localized swelling, mass and lump, trunk: Secondary | ICD-10-CM

## 2018-11-25 DIAGNOSIS — D171 Benign lipomatous neoplasm of skin and subcutaneous tissue of trunk: Secondary | ICD-10-CM

## 2018-11-25 MED ORDER — DOXYCYCLINE HYCLATE 100 MG PO TABS: 100.0000 mg | ORAL_TABLET | Freq: Two times a day (BID) | ORAL | 0 refills | Status: AC

## 2018-11-25 NOTE — ED Notes (Signed)
Patient able to ambulate independently  

## 2018-11-25 NOTE — Assessment & Plan Note (Signed)
Slight dehiscence, Steri-Strips applied, I will see him back on Friday to remove the lesion on his anterior chest, we can take a look at the lipoma excision site at that time.
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
## 2018-11-25 NOTE — Discharge Instructions (Signed)
Keep appoint with your specialist today. Return for worsening pain, purulent discharge, fever.

## 2018-11-25 NOTE — Assessment & Plan Note (Signed)
10 days post surgical excision of a large subcutaneous sebaceous cyst. Incision is intact, there is an underlying palpable hematoma. Adding a course of doxycycline to be safe. No drainage will be attempted, this will reabsorb on its own.

## 2018-11-25 NOTE — Progress Notes (Addendum)
  Subjective: I excised a 6 cm lipoma back approximately 3 weeks ago, there was a small dehiscence of the lateral edge of the incision, this was closed with Steri-Strips.  Objective: General: Well-developed, well-nourished, and in no acute distress. Upper back: Small dehiscence of the lipoma surgical site, no further underlying hematoma, sebaceous cyst surgical site is still intact, there is a bit of a hematoma underlying.  Assessment/plan:   Lipoma of back Slight dehiscence, Steri-Strips applied, I will see him back on Friday to remove the lesion on his anterior chest, we can take a look at the lipoma excision site at that time.  Mass on back 10 days post surgical excision of a large subcutaneous sebaceous cyst. Incision is intact, there is an underlying palpable hematoma. Adding a course of doxycycline to be safe. No drainage will be attempted, this will reabsorb on its own.    ___________________________________________ Gwen Her. Dianah Field, M.D., ABFM., CAQSM. Primary Care and Sports Medicine [facility]  Adjunct Professor of [place] of [employer]

## 2018-11-25 NOTE — ED Triage Notes (Signed)
Pt presents to [facility] for assessment after having a large mass removed from his left shoulder on 9/11.  Has a follow up with the doctor who did it on Friday for removal.  States yesterday he was getting in to his truck and banged his back on the door.  Noted oozing of blood and felt like he had "knocked something loose" and wanted to have it checked on.  Saturated dressings with discharge noted upon this RNs assessment.  Area otherwise looks intact.  Patient is on a blood thinner.

## 2018-11-25 NOTE — Addendum Note (Signed)
Addended by: Silverio Decamp on: 11/25/2018 01:17 PM   Modules accepted: Orders

## 2018-11-28 ENCOUNTER — Other Ambulatory Visit: Payer: Self-pay | Admitting: Cardiology

## 2018-11-28 ENCOUNTER — Other Ambulatory Visit: Payer: Self-pay | Admitting: Family Medicine

## 2018-11-29 ENCOUNTER — Ambulatory Visit
Admission: RE | Admit: 2018-11-29 | Discharge: 2018-11-29 | Disposition: A | Discharge status: Home / Self Care | Payer: Medicare Other | Source: Ambulatory Visit | Attending: Sports Medicine | Admitting: Sports Medicine

## 2018-11-29 ENCOUNTER — Other Ambulatory Visit: Payer: Self-pay | Admitting: Sports Medicine

## 2018-11-29 ENCOUNTER — Ambulatory Visit (INDEPENDENT_AMBULATORY_CARE_PROVIDER_SITE_OTHER): Payer: Medicare Other | Admitting: Sports Medicine

## 2018-11-29 ENCOUNTER — Other Ambulatory Visit: Payer: Self-pay

## 2018-11-29 ENCOUNTER — Encounter: Payer: Self-pay | Admitting: Sports Medicine

## 2018-11-29 DIAGNOSIS — D171 Benign lipomatous neoplasm of skin and subcutaneous tissue of trunk: Secondary | ICD-10-CM

## 2018-11-29 DIAGNOSIS — L723 Sebaceous cyst: Secondary | ICD-10-CM | POA: Diagnosis not present

## 2018-11-29 DIAGNOSIS — N6320 Unspecified lump in the left breast, unspecified quadrant: Secondary | ICD-10-CM

## 2018-11-29 DIAGNOSIS — R928 Other abnormal and inconclusive findings on diagnostic imaging of breast: Secondary | ICD-10-CM | POA: Diagnosis not present

## 2018-11-29 DIAGNOSIS — N6489 Other specified disorders of breast: Secondary | ICD-10-CM | POA: Diagnosis not present

## 2018-11-29 MED ORDER — HYDROCODONE-ACETAMINOPHEN 5-325 MG PO TABS: 1.0000 | ORAL_TABLET | Freq: Three times a day (TID) | ORAL | 0 refills | Status: DC | PRN

## 2018-11-29 NOTE — Progress Notes (Signed)
   Procedure:  Excision of large 5 cm anterior chest wall subcutaneous tumor Risks, benefits, and alternatives explained and consent obtained. Time out conducted. Surface prepped with alcohol. 15cc lidocaine with epinephine infiltrated in a field block. Adequate anesthesia ensured. Area prepped and draped in a sterile fashion. Excision performed with: I made an elliptical incision over the subcutaneous tumor, then using sharp, blunt, electrodissection I was able to remove it, it appeared to be a subcutaneous cyst.  I then closed the incision with 3 deep dermal 3-0 Vicryl simple interrupted sutures and then a running subcuticular 3-0 Vicryl suture. Hemostasis achieved. Pt stable.

## 2018-11-29 NOTE — Assessment & Plan Note (Signed)
Excision of large anterior chest wall subcutaneous tumor/sebaceous cyst. Return in 2 weeks for a wound check.

## 2018-12-06 ENCOUNTER — Ambulatory Visit (INDEPENDENT_AMBULATORY_CARE_PROVIDER_SITE_OTHER): Payer: Medicare Other | Admitting: Pharmacist

## 2018-12-06 ENCOUNTER — Telehealth: Payer: Self-pay | Admitting: Cardiology

## 2018-12-06 ENCOUNTER — Other Ambulatory Visit: Payer: Self-pay

## 2018-12-06 DIAGNOSIS — Z7901 Long term (current) use of anticoagulants: Secondary | ICD-10-CM | POA: Diagnosis not present

## 2018-12-06 DIAGNOSIS — Z9889 Other specified postprocedural states: Secondary | ICD-10-CM | POA: Diagnosis not present

## 2018-12-06 DIAGNOSIS — I059 Rheumatic mitral valve disease, unspecified: Secondary | ICD-10-CM | POA: Diagnosis not present

## 2018-12-06 LAB — POCT INR: INR: 6.3 — AB (ref 2.0–3.0)

## 2018-12-06 NOTE — Telephone Encounter (Signed)
   Received outpatient page from [place] regarding patient's INR at 6.3.  Patient was seen in the Coumadin clinic earlier today in which he was given instructions as below:  Hold warfarin dose today and Saturday. Take 5 mg on Sunday. Come in for visit of Monday to check INR.   Kathyrn Drown NP-C [place] Pager: [phone]

## 2018-12-07 LAB — PROTIME-INR
INR: 6.4 (ref 0.8–1.2)
Prothrombin Time: 63 s — ABNORMAL HIGH (ref 9.1–12.0)

## 2018-12-09 ENCOUNTER — Ambulatory Visit (INDEPENDENT_AMBULATORY_CARE_PROVIDER_SITE_OTHER): Payer: Medicare Other | Admitting: Pharmacist

## 2018-12-09 ENCOUNTER — Other Ambulatory Visit: Payer: Self-pay

## 2018-12-09 DIAGNOSIS — I059 Rheumatic mitral valve disease, unspecified: Secondary | ICD-10-CM

## 2018-12-09 DIAGNOSIS — Z7901 Long term (current) use of anticoagulants: Secondary | ICD-10-CM | POA: Diagnosis not present

## 2018-12-09 DIAGNOSIS — Z9889 Other specified postprocedural states: Secondary | ICD-10-CM

## 2018-12-09 LAB — POCT INR: INR: 1.5 — AB (ref 2.0–3.0)

## 2018-12-11 ENCOUNTER — Other Ambulatory Visit: Payer: Self-pay | Admitting: Family Medicine

## 2018-12-11 ENCOUNTER — Other Ambulatory Visit: Payer: Self-pay | Admitting: Cardiology

## 2018-12-13 ENCOUNTER — Ambulatory Visit (INDEPENDENT_AMBULATORY_CARE_PROVIDER_SITE_OTHER): Payer: Medicare Other | Admitting: Sports Medicine

## 2018-12-13 ENCOUNTER — Other Ambulatory Visit: Payer: Self-pay

## 2018-12-13 ENCOUNTER — Encounter: Payer: Self-pay | Admitting: Sports Medicine

## 2018-12-13 DIAGNOSIS — L723 Sebaceous cyst: Secondary | ICD-10-CM

## 2018-12-13 NOTE — Progress Notes (Signed)
  Subjective: Follow-up post right anterior chest sebaceous cyst excision, doing well.  Objective: General: Well-developed, well-nourished, and in no acute distress. Incision: Clean, dry, intact, I debrided devitalized tissue and dried blood, and redressed it.  Assessment/plan:   Sebaceous cyst Doing well post right upper chest surgical excision of a sebaceous cyst. Return as needed.    ___________________________________________ Gwen Her. Dianah Field, M.D., ABFM., CAQSM. Primary Care and Sports Medicine [facility]  Adjunct Professor of [place] [employer] [facility] of Medicine

## 2018-12-13 NOTE — Assessment & Plan Note (Signed)
Doing well post right upper chest surgical excision of a sebaceous cyst. Return as needed.

## 2018-12-19 ENCOUNTER — Other Ambulatory Visit: Payer: Self-pay

## 2018-12-19 MED ORDER — WARFARIN SODIUM 10 MG PO TABS: ORAL_TABLET | ORAL | 1 refills | Status: DC

## 2018-12-19 NOTE — Telephone Encounter (Signed)
Pt called requesting a refill on his Warfarin rx.  Pt states he has been out of his medication x 2 days and is awaiting a refill.  Sent in rx refill of Warfarin to [address] as requested by pt.  Advised pt in the future to contact us before he runs out of Warfarin, as it is important he not miss any dosages of Warfarin.  Pt verbalized understanding.

## 2018-12-20 ENCOUNTER — Other Ambulatory Visit: Payer: Self-pay | Admitting: Pharmacist

## 2018-12-20 MED ORDER — WARFARIN SODIUM 10 MG PO TABS: ORAL_TABLET | ORAL | 1 refills | Status: DC

## 2018-12-27 ENCOUNTER — Ambulatory Visit (INDEPENDENT_AMBULATORY_CARE_PROVIDER_SITE_OTHER): Payer: Medicare Other | Admitting: Pharmacist Clinician (PhC)/ Clinical Pharmacy Specialist

## 2018-12-27 ENCOUNTER — Other Ambulatory Visit: Payer: Self-pay

## 2018-12-27 DIAGNOSIS — Z7901 Long term (current) use of anticoagulants: Secondary | ICD-10-CM | POA: Diagnosis not present

## 2018-12-27 DIAGNOSIS — Z952 Presence of prosthetic heart valve: Secondary | ICD-10-CM

## 2018-12-27 DIAGNOSIS — I059 Rheumatic mitral valve disease, unspecified: Secondary | ICD-10-CM

## 2018-12-27 DIAGNOSIS — Z9889 Other specified postprocedural states: Secondary | ICD-10-CM

## 2018-12-27 LAB — POCT INR: INR: 4.5 — AB (ref 2.0–3.0)

## 2018-12-30 ENCOUNTER — Other Ambulatory Visit: Payer: Self-pay

## 2018-12-30 ENCOUNTER — Other Ambulatory Visit: Payer: Self-pay | Admitting: Sports Medicine

## 2018-12-30 ENCOUNTER — Ambulatory Visit
Admission: RE | Admit: 2018-12-30 | Discharge: 2018-12-30 | Disposition: A | Discharge status: Home / Self Care | Payer: Medicare Other | Source: Ambulatory Visit | Attending: Sports Medicine | Admitting: Sports Medicine

## 2018-12-30 DIAGNOSIS — N6002 Solitary cyst of left breast: Secondary | ICD-10-CM | POA: Diagnosis not present

## 2018-12-30 DIAGNOSIS — N6320 Unspecified lump in the left breast, unspecified quadrant: Secondary | ICD-10-CM

## 2018-12-30 DIAGNOSIS — N6489 Other specified disorders of breast: Secondary | ICD-10-CM | POA: Diagnosis not present

## 2018-12-31 ENCOUNTER — Other Ambulatory Visit: Payer: Self-pay | Admitting: Family Medicine

## 2019-01-13 ENCOUNTER — Other Ambulatory Visit: Payer: Self-pay

## 2019-01-13 ENCOUNTER — Other Ambulatory Visit: Payer: Self-pay | Admitting: Pharmacist Clinician (PhC)/ Clinical Pharmacy Specialist

## 2019-01-13 ENCOUNTER — Other Ambulatory Visit: Payer: Self-pay | Admitting: *Deleted

## 2019-01-13 ENCOUNTER — Telehealth: Payer: Self-pay | Admitting: *Deleted

## 2019-01-13 ENCOUNTER — Ambulatory Visit (INDEPENDENT_AMBULATORY_CARE_PROVIDER_SITE_OTHER): Payer: Medicare Other | Admitting: Pharmacist Clinician (PhC)/ Clinical Pharmacy Specialist

## 2019-01-13 DIAGNOSIS — Z9889 Other specified postprocedural states: Secondary | ICD-10-CM | POA: Diagnosis not present

## 2019-01-13 DIAGNOSIS — R911 Solitary pulmonary nodule: Secondary | ICD-10-CM

## 2019-01-13 DIAGNOSIS — Z7901 Long term (current) use of anticoagulants: Secondary | ICD-10-CM

## 2019-01-13 LAB — POCT INR: INR: 3.2 — AB (ref 2.0–3.0)

## 2019-01-13 MED ORDER — WARFARIN SODIUM 10 MG PO TABS: ORAL_TABLET | ORAL | 1 refills | Status: DC

## 2019-01-13 NOTE — Telephone Encounter (Signed)
Called patient about upcoming appointment for CT on 11/23 left message appointment was canceled and to call [phone].Marland Kitchen Appointment was cancled due to insurance sent message to [facility] to reschedule at different facility.

## 2019-01-13 NOTE — Progress Notes (Signed)
ct 

## 2019-01-20 ENCOUNTER — Other Ambulatory Visit: Payer: Self-pay | Admitting: Cardiology

## 2019-01-20 DIAGNOSIS — I428 Other cardiomyopathies: Secondary | ICD-10-CM

## 2019-01-21 ENCOUNTER — Other Ambulatory Visit: Payer: Self-pay | Admitting: Family Medicine

## 2019-01-22 ENCOUNTER — Ambulatory Visit
Admission: RE | Admit: 2019-01-22 | Discharge: 2019-01-22 | Disposition: A | Discharge status: Home / Self Care | Payer: Medicare Other | Source: Ambulatory Visit | Attending: Cardiology | Admitting: Cardiology

## 2019-01-22 ENCOUNTER — Other Ambulatory Visit: Payer: Self-pay

## 2019-01-22 ENCOUNTER — Other Ambulatory Visit: Payer: Medicare Other

## 2019-01-22 DIAGNOSIS — R911 Solitary pulmonary nodule: Secondary | ICD-10-CM | POA: Diagnosis not present

## 2019-01-23 NOTE — Telephone Encounter (Signed)
Rx(s) sent to pharmacy electronically.  

## 2019-01-24 ENCOUNTER — Telehealth: Payer: Self-pay | Admitting: Cardiology

## 2019-01-24 NOTE — Telephone Encounter (Signed)
New message    Please call with CT results

## 2019-01-24 NOTE — Telephone Encounter (Signed)
Called patient and gave results. Patient verbalized understanding, thankful for call.

## 2019-01-27 ENCOUNTER — Inpatient Hospital Stay: Admission: RE | Admit: 2019-01-27 | Payer: Medicare Other | Source: Ambulatory Visit

## 2019-01-29 ENCOUNTER — Other Ambulatory Visit: Payer: Self-pay

## 2019-02-10 ENCOUNTER — Other Ambulatory Visit: Payer: Self-pay

## 2019-02-10 ENCOUNTER — Ambulatory Visit (INDEPENDENT_AMBULATORY_CARE_PROVIDER_SITE_OTHER): Payer: Medicare Other | Admitting: Pharmacist

## 2019-02-10 DIAGNOSIS — Z7901 Long term (current) use of anticoagulants: Secondary | ICD-10-CM | POA: Diagnosis not present

## 2019-02-10 DIAGNOSIS — Z9889 Other specified postprocedural states: Secondary | ICD-10-CM | POA: Diagnosis not present

## 2019-02-10 LAB — POCT INR: INR: 4.3 — AB (ref 2.0–3.0)

## 2019-03-05 ENCOUNTER — Other Ambulatory Visit: Payer: Self-pay

## 2019-03-05 ENCOUNTER — Ambulatory Visit (INDEPENDENT_AMBULATORY_CARE_PROVIDER_SITE_OTHER): Payer: Medicare Other | Admitting: Pharmacist

## 2019-03-05 DIAGNOSIS — Z9889 Other specified postprocedural states: Secondary | ICD-10-CM | POA: Diagnosis not present

## 2019-03-05 DIAGNOSIS — Z7901 Long term (current) use of anticoagulants: Secondary | ICD-10-CM | POA: Diagnosis not present

## 2019-03-05 LAB — POCT INR: INR: 3.9 — AB (ref 2.0–3.0)
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
## 2019-03-15 ENCOUNTER — Other Ambulatory Visit: Payer: Self-pay | Admitting: Cardiology

## 2019-03-24 ENCOUNTER — Encounter (INDEPENDENT_AMBULATORY_CARE_PROVIDER_SITE_OTHER): Payer: Self-pay

## 2019-03-24 ENCOUNTER — Ambulatory Visit (INDEPENDENT_AMBULATORY_CARE_PROVIDER_SITE_OTHER): Payer: Medicare Other | Admitting: Pharmacist

## 2019-03-24 ENCOUNTER — Other Ambulatory Visit: Payer: Self-pay

## 2019-03-24 ENCOUNTER — Encounter: Payer: Self-pay | Admitting: Pharmacist

## 2019-03-24 DIAGNOSIS — Z9889 Other specified postprocedural states: Secondary | ICD-10-CM | POA: Diagnosis not present

## 2019-03-24 DIAGNOSIS — Z7901 Long term (current) use of anticoagulants: Secondary | ICD-10-CM | POA: Diagnosis not present

## 2019-03-24 LAB — POCT INR: INR: 3.4 — AB (ref 2.0–3.0)

## 2019-03-25 ENCOUNTER — Telehealth: Payer: Self-pay | Admitting: *Deleted

## 2019-03-25 NOTE — Telephone Encounter (Signed)
A message was left, re: his follow up visit. 

## 2019-03-28 ENCOUNTER — Other Ambulatory Visit: Payer: Self-pay

## 2019-03-28 MED ORDER — FLUTICASONE-SALMETEROL 250-50 MCG/DOSE IN AEPB: 1.0000 | INHALATION_SPRAY | Freq: Two times a day (BID) | RESPIRATORY_TRACT | 2 refills | Status: DC

## 2019-03-28 NOTE — Telephone Encounter (Signed)
Corey Mathis is requesting a switch to generic Advair.
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
## 2019-04-04 ENCOUNTER — Telehealth (INDEPENDENT_AMBULATORY_CARE_PROVIDER_SITE_OTHER): Payer: Medicare Other | Admitting: Cardiology

## 2019-04-04 ENCOUNTER — Encounter: Payer: Self-pay | Admitting: Cardiology

## 2019-04-04 VITALS — BP 135/85 | HR 69 | Ht 75.5 in | Wt 200.0 lb

## 2019-04-04 DIAGNOSIS — I712 Thoracic aortic aneurysm, without rupture, unspecified: Secondary | ICD-10-CM

## 2019-04-04 DIAGNOSIS — I10 Essential (primary) hypertension: Secondary | ICD-10-CM | POA: Diagnosis not present

## 2019-04-04 DIAGNOSIS — Z952 Presence of prosthetic heart valve: Secondary | ICD-10-CM | POA: Diagnosis not present

## 2019-04-04 DIAGNOSIS — E7800 Pure hypercholesterolemia, unspecified: Secondary | ICD-10-CM | POA: Diagnosis not present

## 2019-04-04 DIAGNOSIS — I428 Other cardiomyopathies: Secondary | ICD-10-CM

## 2019-04-04 MED ORDER — HYDRALAZINE HCL 50 MG PO TABS: 50.0000 mg | ORAL_TABLET | Freq: Three times a day (TID) | ORAL | 3 refills | Status: DC

## 2019-04-04 NOTE — Patient Instructions (Signed)
Medication Instructions:  INCREASE HYDRALAZINE TO 50 MG THREE TIMES DAILY= 2 OF THE 25 MG TABLETS THREE TIMES DAILY  *If you need a refill on your cardiac medications before your next appointment, please call your pharmacy*  Lab Work: Your physician recommends that you return for lab work PRIOR TO EATING  If you have labs (blood work) drawn today and your tests are completely normal, you will receive your results only by: Marland Kitchen MyChart Message (if you have MyChart) OR . A paper copy in the mail If you have any lab test that is abnormal or we need to change your treatment, we will call you to review the results.  Follow-Up: At [facility], you and your health needs are our priority.  As part of our continuing mission to provide you with exceptional heart care, we have created designated Provider Care Teams.  These Care Teams include your primary Cardiologist (physician) and Advanced Practice Providers (APPs -  Physician Assistants and Nurse Practitioners) who all work together to provide you with the care you need, when you need it.  Your next appointment:   6 month(s)  The format for your next appointment:   Either In Person or Virtual  Provider:   You may see Kirk Ruths, MD or one of the following Advanced Practice Providers on your designated Care Team:    Kerin Ransom, PA-C  [place], [place]  Coletta Memos, [place]

## 2019-04-11 ENCOUNTER — Other Ambulatory Visit: Payer: Self-pay | Admitting: Family Medicine

## 2019-04-21 ENCOUNTER — Ambulatory Visit (INDEPENDENT_AMBULATORY_CARE_PROVIDER_SITE_OTHER): Payer: Medicare Other | Admitting: Pharmacist Clinician (PhC)/ Clinical Pharmacy Specialist

## 2019-04-21 ENCOUNTER — Other Ambulatory Visit: Payer: Self-pay

## 2019-04-21 DIAGNOSIS — Z7901 Long term (current) use of anticoagulants: Secondary | ICD-10-CM | POA: Diagnosis not present

## 2019-04-21 DIAGNOSIS — Z952 Presence of prosthetic heart valve: Secondary | ICD-10-CM | POA: Diagnosis not present

## 2019-04-21 DIAGNOSIS — E7800 Pure hypercholesterolemia, unspecified: Secondary | ICD-10-CM | POA: Diagnosis not present

## 2019-04-21 DIAGNOSIS — Z9889 Other specified postprocedural states: Secondary | ICD-10-CM | POA: Diagnosis not present

## 2019-04-21 DIAGNOSIS — I428 Other cardiomyopathies: Secondary | ICD-10-CM | POA: Diagnosis not present

## 2019-04-21 LAB — POCT INR: INR: 3.9 — AB (ref 2.0–3.0)

## 2019-04-22 ENCOUNTER — Encounter: Payer: Self-pay | Admitting: *Deleted

## 2019-04-22 LAB — LIPID PANEL
Chol/HDL Ratio: 4.2 ratio (ref 0.0–5.0)
Cholesterol, Total: 162 mg/dL (ref 100–199)
HDL: 39 mg/dL — ABNORMAL LOW (ref >39)
LDL Chol Calc (NIH): 97 mg/dL (ref 0–99)
Triglycerides: 144 mg/dL (ref 0–149)
VLDL Cholesterol Cal: 26 mg/dL (ref 5–40)

## 2019-04-22 LAB — COMPREHENSIVE METABOLIC PANEL
ALT: 62 IU/L — ABNORMAL HIGH (ref 0–44)
AST: 59 IU/L — ABNORMAL HIGH (ref 0–40)
Albumin/Globulin Ratio: 1.5 (ref 1.2–2.2)
Albumin: 4.4 g/dL (ref 3.8–4.8)
Alkaline Phosphatase: 91 IU/L (ref 39–117)
BUN/Creatinine Ratio: 9 — ABNORMAL LOW (ref 10–24)
BUN: 9 mg/dL (ref 8–27)
Bilirubin Total: 0.5 mg/dL (ref 0.0–1.2)
CO2: 27 mmol/L (ref 20–29)
Calcium: 9.8 mg/dL (ref 8.6–10.2)
Chloride: 99 mmol/L (ref 96–106)
Creatinine, Ser: 0.95 mg/dL (ref 0.76–1.27)
GFR calc Af Amer: 93 mL/min/{1.73_m2} (ref >59)
GFR calc non Af Amer: 81 mL/min/{1.73_m2} (ref >59)
Globulin, Total: 3 g/dL (ref 1.5–4.5)
Glucose: 85 mg/dL (ref 65–99)
Potassium: 4.5 mmol/L (ref 3.5–5.2)
Sodium: 139 mmol/L (ref 134–144)
Total Protein: 7.4 g/dL (ref 6.0–8.5)

## 2019-04-22 LAB — CBC
Hematocrit: 44.9 % (ref 37.5–51.0)
Hemoglobin: 15.4 g/dL (ref 13.0–17.7)
MCH: 30.1 pg (ref 26.6–33.0)
MCHC: 34.3 g/dL (ref 31.5–35.7)
MCV: 88 fL (ref 79–97)
Platelets: 183 10*3/uL (ref 150–450)
RBC: 5.12 x10E6/uL (ref 4.14–5.80)
RDW: 13.9 % (ref 11.6–15.4)
WBC: 4 10*3/uL (ref 3.4–10.8)

## 2019-05-12 ENCOUNTER — Telehealth: Payer: Self-pay | Admitting: *Deleted

## 2019-05-12 ENCOUNTER — Other Ambulatory Visit: Payer: Self-pay

## 2019-05-12 ENCOUNTER — Ambulatory Visit (INDEPENDENT_AMBULATORY_CARE_PROVIDER_SITE_OTHER): Payer: Medicare Other | Admitting: Pharmacist Clinician (PhC)/ Clinical Pharmacy Specialist

## 2019-05-12 DIAGNOSIS — Z7901 Long term (current) use of anticoagulants: Secondary | ICD-10-CM | POA: Diagnosis not present

## 2019-05-12 DIAGNOSIS — Z9889 Other specified postprocedural states: Secondary | ICD-10-CM | POA: Diagnosis not present

## 2019-05-12 LAB — PROTIME-INR
INR: 6.3 (ref 0.9–1.2)
Prothrombin Time: 66.9 s — ABNORMAL HIGH (ref 9.1–12.0)

## 2019-05-12 LAB — POCT INR: INR: 6.3 — AB (ref 2.0–3.0)

## 2019-05-12 NOTE — Telephone Encounter (Signed)
Received a critical lab results from [place].  Patients INR was 6.3 and PT was 66.9.  Message has been routed to the coumadin clinic

## 2019-05-19 ENCOUNTER — Other Ambulatory Visit: Payer: Self-pay

## 2019-05-19 ENCOUNTER — Ambulatory Visit (INDEPENDENT_AMBULATORY_CARE_PROVIDER_SITE_OTHER): Payer: Medicare Other | Admitting: Pharmacist

## 2019-05-19 DIAGNOSIS — Z9889 Other specified postprocedural states: Secondary | ICD-10-CM

## 2019-05-19 DIAGNOSIS — Z7901 Long term (current) use of anticoagulants: Secondary | ICD-10-CM

## 2019-05-19 LAB — POCT INR: INR: 1.1 — AB (ref 2.0–3.0)

## 2019-05-27 ENCOUNTER — Ambulatory Visit (INDEPENDENT_AMBULATORY_CARE_PROVIDER_SITE_OTHER): Payer: Medicare Other

## 2019-05-27 ENCOUNTER — Ambulatory Visit
Admission: EM | Admit: 2019-05-27 | Discharge: 2019-05-27 | Disposition: A | Discharge status: Home / Self Care | Payer: Medicare Other | Attending: Emergency Medicine | Admitting: Emergency Medicine

## 2019-05-27 ENCOUNTER — Other Ambulatory Visit: Payer: Self-pay

## 2019-05-27 ENCOUNTER — Encounter: Payer: Self-pay | Admitting: Emergency Medicine

## 2019-05-27 DIAGNOSIS — S61112A Laceration without foreign body of left thumb with damage to nail, initial encounter: Secondary | ICD-10-CM | POA: Diagnosis not present

## 2019-05-27 DIAGNOSIS — W312XXA Contact with powered woodworking and forming machines, initial encounter: Secondary | ICD-10-CM | POA: Diagnosis not present

## 2019-05-27 DIAGNOSIS — Z7901 Long term (current) use of anticoagulants: Secondary | ICD-10-CM

## 2019-05-27 DIAGNOSIS — S61012A Laceration without foreign body of left thumb without damage to nail, initial encounter: Secondary | ICD-10-CM | POA: Diagnosis not present

## 2019-05-27 DIAGNOSIS — M79645 Pain in left finger(s): Secondary | ICD-10-CM

## 2019-05-27 NOTE — ED Notes (Signed)
Patient able to ambulate independently  

## 2019-05-27 NOTE — ED Triage Notes (Signed)
Pt presents to [facility] for assessment after hitting his left thumb with the table saw in motion.  Pt is on coumadin.  States he is "up to date" on his tetanus.

## 2019-05-27 NOTE — Discharge Instructions (Addendum)
Keep area(s) clean and dry. Return for worsening pain, redness, swelling, discharge, fever. Schedule follow up with PCP for wound monitoring.
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
## 2019-05-29 ENCOUNTER — Ambulatory Visit (INDEPENDENT_AMBULATORY_CARE_PROVIDER_SITE_OTHER): Payer: Medicare Other | Admitting: Family Medicine

## 2019-05-29 ENCOUNTER — Encounter: Payer: Self-pay | Admitting: Family Medicine

## 2019-05-29 ENCOUNTER — Other Ambulatory Visit: Payer: Self-pay

## 2019-05-29 VITALS — BP 134/88 | HR 57 | Ht 76.0 in | Wt 208.0 lb

## 2019-05-29 DIAGNOSIS — S61112A Laceration without foreign body of left thumb with damage to nail, initial encounter: Secondary | ICD-10-CM

## 2019-05-29 DIAGNOSIS — J4530 Mild persistent asthma, uncomplicated: Secondary | ICD-10-CM | POA: Diagnosis not present

## 2019-05-29 DIAGNOSIS — I10 Essential (primary) hypertension: Secondary | ICD-10-CM | POA: Diagnosis not present

## 2019-05-29 MED ORDER — BREO ELLIPTA 100-25 MCG/INH IN AEPB: 1.0000 | INHALATION_SPRAY | Freq: Every day | RESPIRATORY_TRACT | 5 refills | Status: DC

## 2019-05-29 NOTE — Assessment & Plan Note (Addendum)
Blood blood pressure quite elevated but repeat looks much better.

## 2019-05-29 NOTE — Progress Notes (Signed)
Seen in UC on 3/23

## 2019-05-29 NOTE — Progress Notes (Signed)
Acute Office Visit  Subjective:    Patient ID: Corey Mathis, male    DOB: 1950/09/22, 69 y.o.   MRN: [id number]  Chief Complaint  Patient presents with  . Follow-up    HPI Patient is in today for f/u for Left thumb wound. Cut the tip of his thumb on a table saw, while working with his son.Marland Kitchen  He was seen at urgent care 3 days ago he says he has 4 sutures placed.  They did do an x-ray just to rule out fracture.  He says he did undress it yesterday just really quickly just to make sure that looked okay.  He has not noticed any drainage except for blood. Is have some discomfort with that.  He says he is having some pain and at times it will actually throb.  He would usually just raise his thumb up and that seems to help.  He has been using Tylenol and said he had some old medication that Dr. Dianah Field that had given him but says he actually really has not used it.  Follow-up asthma -he is already on a generic Advair and says it is, cost him about $100 a month.  So he would like to try a similar inhaler that might be better covered.  He still has about a $300 drug deductible.  Past Medical History:  Diagnosis Date  . Allergy   . Anxiety   . Asthma   . CHF (congestive heart failure) ([place])   . Dyslipidemia   . History of mitral valve replacement   . Hypertension   . Labyrinthitis    hx    Past Surgical History:  Procedure Laterality Date  . ACNE CYST REMOVAL     from hand and back  . EXTERNAL FIXATION LEG Right 08/11/2017   Procedure: EXTERNAL FIXATION, RIGHT KNEE;  Surgeon: Leandrew Koyanagi, MD;  Location: [place];  Service: Orthopedics;  Laterality: Right;  . FINE NEEDLE ASPIRATION Right 08/11/2017   Procedure: FINE NEEDLE ASPIRATION  of right Knee with 80 cc of dark red fluid removed;  Surgeon: Leandrew Koyanagi, MD;  Location: [place];  Service: Orthopedics;  Laterality: Right;  . FLEXIBLE SIGMOIDOSCOPY N/A 02/11/2018   Procedure: FLEXIBLE SIGMOIDOSCOPY;  Surgeon: Doran Stabler, MD;   Location: [place];  Service: Gastroenterology;  Laterality: N/A;  . Jaw surgery (other)    . NASAL SINUS SURGERY N/A 10/16/2012   Procedure: NASAL ENDOSCOPIC POLYPECTOMY/MAXILLARY ANTROSTOMY/ETHMOIDECTOMY;  Surgeon: Izora Gala, MD;  Location: [place];  Service: ENT;  Laterality: N/A;  . ORIF TIBIA PLATEAU Left 08/13/2017   Procedure: OPEN REDUCTION INTERNAL FIXATION (ORIF) TIBIAL PLATEAU;  Surgeon: Leandrew Koyanagi, MD;  Location: [place];  Service: Orthopedics;  Laterality: Left;  . ORIF TIBIA PLATEAU Right 08/22/2017   Procedure: OPEN REDUCTION INTERNAL FIXATION (ORIF) RIGHT BICONDYLAR TIBIAL PLATEAU, REMOVAL OF EX FIX;  Surgeon: Leandrew Koyanagi, MD;  Location: [place];  Service: Orthopedics;  Laterality: Right;  . RIGHT/LEFT HEART CATH AND CORONARY ANGIOGRAPHY N/A 07/12/2017   Procedure: RIGHT/LEFT HEART CATH AND CORONARY ANGIOGRAPHY;  Surgeon: Burnell Blanks, MD;  Location: [place] [facility];  Service: Cardiovascular;  Laterality: N/A;  . SUBMUCOSAL INJECTION  02/11/2018   Procedure: SUBMUCOSAL INJECTION;  Surgeon: Doran Stabler, MD;  Location: [place];  Service: Gastroenterology;;  . TOOTH EXTRACTION    . VALVE REPLACEMENT  1998   St. Jude, mitral    Family History  Problem Relation Age of Onset  .  Heart attack Father 57  . Colon cancer Mother 40  . Hypertension Mother   . Breast cancer Maternal Aunt   . Stomach cancer Neg Hx   . Esophageal cancer Neg Hx   . Inflammatory bowel disease Neg Hx   . Liver disease Neg Hx   . Pancreatic cancer Neg Hx     Social History   Socioeconomic History  . Marital status: Married    Spouse name: Frenchie   . Number of children: 2  . Years of education: Not on file  . Highest education level: Not on file  Occupational History  . Occupation: retired    Fish farm manager: [employer]  Tobacco Use  . Smoking status: Never Smoker  . Smokeless tobacco: Never Used  Substance and Sexual Activity  . Alcohol use: Never  . Drug use: Never  .  Sexual activity: Not Currently  Other Topics Concern  . Not on file  Social History Narrative   ** Merged History Encounter **       Some exercise, bike and walking.  2 cups per day.    Social Determinants of Health   Financial Resource Strain:   . Difficulty of Paying Living Expenses:   Food Insecurity:   . Worried About Charity fundraiser in the Last Year:   . Arboriculturist in the Last Year:   Transportation Needs:   . Film/video editor (Medical):   Marland Kitchen Lack of Transportation (Non-Medical):   Physical Activity:   . Days of Exercise per Week:   . Minutes of Exercise per Session:   Stress:   . Feeling of Stress :   Social Connections:   . Frequency of Communication with Friends and Family:   . Frequency of Social Gatherings with Friends and Family:   . Attends Religious Services:   . Active Member of Clubs or Organizations:   . Attends Archivist Meetings:   Marland Kitchen Marital Status:   Intimate Partner Violence:   . Fear of Current or Ex-Partner:   . Emotionally Abused:   Marland Kitchen Physically Abused:   . Sexually Abused:     Outpatient Medications Prior to Visit  Medication Sig Dispense Refill  . albuterol (PROAIR HFA) 108 (90 Base) MCG/ACT inhaler Inhale 2 puffs into the lungs every 6 (six) hours as needed for wheezing. 3 Inhaler 2  . antiseptic oral rinse (BIOTENE) LIQD 5 mLs by Mouth Rinse route 2 (two) times daily.    Marland Kitchen aspirin 81 MG EC tablet Take 81 mg by mouth at bedtime.     Marland Kitchen atorvastatin (LIPITOR) 10 MG tablet Take 1 tablet (10 mg total) by mouth at bedtime. 90 tablet 1  . cholecalciferol (VITAMIN D) 1000 units tablet Take 1,000 Units by mouth at bedtime.     Marland Kitchen COENZYME Q10 PO Take 1 capsule by mouth at bedtime.     . fluticasone (FLONASE) 50 MCG/ACT nasal spray Place 1 spray into both nostrils daily as needed (congestion).     . hydrALAZINE (APRESOLINE) 50 MG tablet Take 1 tablet (50 mg total) by mouth 3 (three) times daily. 270 tablet 3  . IRON, FERROUS  SULFATE, PO Take 65 mg by mouth daily.    . Melatonin 10 MG TABS Take 1 tablet by mouth as needed.    . metoprolol succinate (TOPROL-XL) 100 MG 24 hr tablet Take 1 tablet by mouth once daily 90 tablet 0  . metoprolol succinate (TOPROL-XL) 50 MG 24 hr tablet TAKE 1 TABLET (  50MG ) BY MOUTH WITH A 100 MG TABLET  DAILY AT BEDTIME FOR TOTAL DOSE OF 150MG . 90 tablet 1  . Multiple Vitamin (MULTIVITAMIN WITH MINERALS) TABS tablet Take 1 tablet by mouth at bedtime.    Marland Kitchen PARoxetine (PAXIL) 20 MG tablet Take 1 tablet by mouth once daily 90 tablet 0  . Probiotic Product (PROBIOTIC PO) Take 1 tablet by mouth daily.    Marland Kitchen warfarin (COUMADIN) 10 MG tablet Take 1 to 1 and 1/2 tablet daily as directed by coumadin clinic 135 tablet 1  . zinc gluconate 50 MG tablet Take 50 mg by mouth daily.    . Fluticasone-Salmeterol (ADVAIR) 250-50 MCG/DOSE AEPB Inhale 1 puff into the lungs 2 (two) times daily. 60 each 2  . isosorbide mononitrate (IMDUR) 30 MG 24 hr tablet Take 1 tablet (30 mg total) by mouth daily. 90 tablet 3  . Turmeric (QC TUMERIC COMPLEX PO) Take 500 mg by mouth daily.    Marland Kitchen lidocaine (XYLOCAINE) 1 % (with pres) injection 15 mL      No facility-administered medications prior to visit.    Allergies  Allergen Reactions  . Lisinopril Anaphylaxis and Other (See Comments)    angioedema  . Carvedilol Other (See Comments)    wheezing  . Ezetimibe-Simvastatin Other (See Comments)    Serious vertigo  . Propranolol Hcl Other (See Comments)    wheezing  . Ramipril Other (See Comments)    wheezing  . Codeine Nausea Only    Review of Systems     Objective:    Physical Exam Vitals reviewed.  Constitutional:      Appearance: He is well-developed.  HENT:     Head: Normocephalic and atraumatic.  Eyes:     Conjunctiva/sclera: Conjunctivae normal.  Cardiovascular:     Rate and Rhythm: Normal rate.  Pulmonary:     Effort: Pulmonary effort is normal.  Skin:    General: Skin is dry.     Coloration:  Skin is not pale.     Comments: Wound on the thumb looks clean.  There is some fresh blood where we remove the gauze after soaking but some of it did cause it to bleed.  No erythema or streaking around the wound.  No pus. No odor.   Neurological:     Mental Status: He is alert and oriented to person, place, and time.  Psychiatric:        Behavior: Behavior normal.     BP 134/88   Pulse (!) 57   Ht 6\' 4"  (1.93 m)   Wt 208 lb (94.3 kg)   SpO2 99%   BMI 25.32 kg/m  Wt Readings from Last 3 Encounters:  05/29/19 208 lb (94.3 kg)  04/04/19 200 lb (90.7 kg)  12/13/18 195 lb (88.5 kg)    There are no preventive care reminders to display for this patient.  There are no preventive care reminders to display for this patient.   Lab Results  Component Value Date   TSH 1.14 07/27/2016   Lab Results  Component Value Date   WBC 4.0 04/21/2019   HGB 15.4 04/21/2019   HCT 44.9 04/21/2019   MCV 88 04/21/2019   PLT 183 04/21/2019   Lab Results  Component Value Date   NA 139 04/21/2019   K 4.5 04/21/2019   CO2 27 04/21/2019   GLUCOSE 85 04/21/2019   BUN 9 04/21/2019   CREATININE 0.95 04/21/2019   BILITOT 0.5 04/21/2019   ALKPHOS 91 04/21/2019   AST  59 (H) 04/21/2019   ALT 62 (H) 04/21/2019   PROT 7.4 04/21/2019   ALBUMIN 4.4 04/21/2019   CALCIUM 9.8 04/21/2019   ANIONGAP 9 02/13/2018   GFR 96.14 05/28/2014   Lab Results  Component Value Date   CHOL 162 04/21/2019   Lab Results  Component Value Date   HDL 39 (L) 04/21/2019   Lab Results  Component Value Date   LDLCALC 97 04/21/2019   Lab Results  Component Value Date   TRIG 144 04/21/2019   Lab Results  Component Value Date   CHOLHDL 4.2 04/21/2019   Lab Results  Component Value Date   HGBA1C 5.6 10/31/2018       Assessment & Plan:   Problem List Items Addressed This Visit      Cardiovascular and Mediastinum   Essential hypertension    Blood blood pressure quite elevated but repeat looks much  better.        Respiratory   Asthma - Primary    Mild persistent.  Discussed options.  Will try Breo to see if covered by insurance.  If price is still too high give [place] a call back.      Relevant Medications   fluticasone furoate-vilanterol (BREO ELLIPTA) 100-25 MCG/INH AEPB    Other Visit Diagnoses    Laceration of left thumb without foreign body with damage to nail, initial encounter          Laceration of the left thumb.-removed bandage today.  We did have to soak it just to get the bandage on [employer].  No sign of infection or erythema no pus or drainage just some fresh blood.  Reapplied a fresh bandage.  Encouraged him to keep that on for 48 hours and then change the bandage on Saturday and then again on Monday and then I will see him on Tuesday to have the sutures removed.  Did give him warning signs and symptoms to look out for in regards to infection and to call us immediately if he sees any of those.  Meds ordered this encounter  Medications  . fluticasone furoate-vilanterol (BREO ELLIPTA) 100-25 MCG/INH AEPB    Sig: Inhale 1 puff into the lungs daily.    Dispense:  60 each    Refill:  5   Time spent 25 min in encounter and review ED notes.    Beatrice Lecher, MD

## 2019-05-29 NOTE — Assessment & Plan Note (Addendum)
Mild persistent.  Discussed options.  Will try Breo to see if covered by insurance.  If price is still too high give [place] a call back.

## 2019-06-03 ENCOUNTER — Ambulatory Visit (INDEPENDENT_AMBULATORY_CARE_PROVIDER_SITE_OTHER): Payer: Medicare Other | Admitting: Family Medicine

## 2019-06-03 ENCOUNTER — Other Ambulatory Visit: Payer: Self-pay

## 2019-06-03 ENCOUNTER — Encounter: Payer: Self-pay | Admitting: Family Medicine

## 2019-06-03 VITALS — BP 134/76

## 2019-06-03 DIAGNOSIS — Z4802 Encounter for removal of sutures: Secondary | ICD-10-CM

## 2019-06-03 DIAGNOSIS — S61112A Laceration without foreign body of left thumb with damage to nail, initial encounter: Secondary | ICD-10-CM

## 2019-06-03 NOTE — Progress Notes (Signed)
Acute Office Visit  Subjective:    Patient ID: Corey Mathis, male    DOB: 12-Dec-1950, 69 y.o.   MRN: [id number]  Chief Complaint  Patient presents with  . Suture / Staple Removal    HPI Patient is in today for suture removal from thumb laceration.  He has been applying Vaseline and keeping it covered.  He has not noticed any pus or drainage no fevers chills or sweats.  He is here to have the sutures removed.  He says he feels like it is gradually getting better.  His sometimes get some pain or discomfort particularly if he lets his hand hang down for too long.  Past Medical History:  Diagnosis Date  . Allergy   . Anxiety   . Asthma   . CHF (congestive heart failure) ([place])   . Dyslipidemia   . History of mitral valve replacement   . Hypertension   . Labyrinthitis    hx    Past Surgical History:  Procedure Laterality Date  . ACNE CYST REMOVAL     from hand and back  . EXTERNAL FIXATION LEG Right 08/11/2017   Procedure: EXTERNAL FIXATION, RIGHT KNEE;  Surgeon: Leandrew Koyanagi, MD;  Location: [place];  Service: Orthopedics;  Laterality: Right;  . FINE NEEDLE ASPIRATION Right 08/11/2017   Procedure: FINE NEEDLE ASPIRATION  of right Knee with 80 cc of dark red fluid removed;  Surgeon: Leandrew Koyanagi, MD;  Location: [place];  Service: Orthopedics;  Laterality: Right;  . FLEXIBLE SIGMOIDOSCOPY N/A 02/11/2018   Procedure: FLEXIBLE SIGMOIDOSCOPY;  Surgeon: Doran Stabler, MD;  Location: [place];  Service: Gastroenterology;  Laterality: N/A;  . Jaw surgery (other)    . NASAL SINUS SURGERY N/A 10/16/2012   Procedure: NASAL ENDOSCOPIC POLYPECTOMY/MAXILLARY ANTROSTOMY/ETHMOIDECTOMY;  Surgeon: Izora Gala, MD;  Location: [place];  Service: ENT;  Laterality: N/A;  . ORIF TIBIA PLATEAU Left 08/13/2017   Procedure: OPEN REDUCTION INTERNAL FIXATION (ORIF) TIBIAL PLATEAU;  Surgeon: Leandrew Koyanagi, MD;  Location: [place];  Service: Orthopedics;  Laterality: Left;  . ORIF TIBIA PLATEAU Right 08/22/2017    Procedure: OPEN REDUCTION INTERNAL FIXATION (ORIF) RIGHT BICONDYLAR TIBIAL PLATEAU, REMOVAL OF EX FIX;  Surgeon: Leandrew Koyanagi, MD;  Location: [place];  Service: Orthopedics;  Laterality: Right;  . RIGHT/LEFT HEART CATH AND CORONARY ANGIOGRAPHY N/A 07/12/2017   Procedure: RIGHT/LEFT HEART CATH AND CORONARY ANGIOGRAPHY;  Surgeon: Burnell Blanks, MD;  Location: [place] [facility];  Service: Cardiovascular;  Laterality: N/A;  . SUBMUCOSAL INJECTION  02/11/2018   Procedure: SUBMUCOSAL INJECTION;  Surgeon: Doran Stabler, MD;  Location: [place];  Service: Gastroenterology;;  . TOOTH EXTRACTION    . VALVE REPLACEMENT  1998   St. Jude, mitral    Family History  Problem Relation Age of Onset  . Heart attack Father 52  . Colon cancer Mother 68  . Hypertension Mother   . Breast cancer Maternal Aunt   . Stomach cancer Neg Hx   . Esophageal cancer Neg Hx   . Inflammatory bowel disease Neg Hx   . Liver disease Neg Hx   . Pancreatic cancer Neg Hx     Social History   Socioeconomic History  . Marital status: Married    Spouse name: Frenchie   . Number of children: 2  . Years of education: Not on file  . Highest education level: Not on file  Occupational History  . Occupation: retired  Employer: [employer]  Tobacco Use  . Smoking status: Never Smoker  . Smokeless tobacco: Never Used  Substance and Sexual Activity  . Alcohol use: Never  . Drug use: Never  . Sexual activity: Not Currently  Other Topics Concern  . Not on file  Social History Narrative   ** Merged History Encounter **       Some exercise, bike and walking.  2 cups per day.    Social Determinants of Health   Financial Resource Strain:   . Difficulty of Paying Living Expenses:   Food Insecurity:   . Worried About Charity fundraiser in the Last Year:   . Arboriculturist in the Last Year:   Transportation Needs:   . Film/video editor (Medical):   Marland Kitchen Lack of Transportation (Non-Medical):    Physical Activity:   . Days of Exercise per Week:   . Minutes of Exercise per Session:   Stress:   . Feeling of Stress :   Social Connections:   . Frequency of Communication with Friends and Family:   . Frequency of Social Gatherings with Friends and Family:   . Attends Religious Services:   . Active Member of Clubs or Organizations:   . Attends Archivist Meetings:   Marland Kitchen Marital Status:   Intimate Partner Violence:   . Fear of Current or Ex-Partner:   . Emotionally Abused:   Marland Kitchen Physically Abused:   . Sexually Abused:     Outpatient Medications Prior to Visit  Medication Sig Dispense Refill  . albuterol (PROAIR HFA) 108 (90 Base) MCG/ACT inhaler Inhale 2 puffs into the lungs every 6 (six) hours as needed for wheezing. 3 Inhaler 2  . antiseptic oral rinse (BIOTENE) LIQD 5 mLs by Mouth Rinse route 2 (two) times daily.    Marland Kitchen aspirin 81 MG EC tablet Take 81 mg by mouth at bedtime.     Marland Kitchen atorvastatin (LIPITOR) 10 MG tablet Take 1 tablet (10 mg total) by mouth at bedtime. 90 tablet 1  . cholecalciferol (VITAMIN D) 1000 units tablet Take 1,000 Units by mouth at bedtime.     Marland Kitchen COENZYME Q10 PO Take 1 capsule by mouth at bedtime.     . fluticasone (FLONASE) 50 MCG/ACT nasal spray Place 1 spray into both nostrils daily as needed (congestion).     . fluticasone furoate-vilanterol (BREO ELLIPTA) 100-25 MCG/INH AEPB Inhale 1 puff into the lungs daily. 60 each 5  . hydrALAZINE (APRESOLINE) 50 MG tablet Take 1 tablet (50 mg total) by mouth 3 (three) times daily. 270 tablet 3  . IRON, FERROUS SULFATE, PO Take 65 mg by mouth daily.    . isosorbide mononitrate (IMDUR) 30 MG 24 hr tablet Take 1 tablet (30 mg total) by mouth daily. 90 tablet 3  . Melatonin 10 MG TABS Take 1 tablet by mouth as needed.    . metoprolol succinate (TOPROL-XL) 100 MG 24 hr tablet Take 1 tablet by mouth once daily 90 tablet 0  . metoprolol succinate (TOPROL-XL) 50 MG 24 hr tablet TAKE 1 TABLET (50MG ) BY MOUTH WITH A  100 MG TABLET  DAILY AT BEDTIME FOR TOTAL DOSE OF 150MG . 90 tablet 1  . Multiple Vitamin (MULTIVITAMIN WITH MINERALS) TABS tablet Take 1 tablet by mouth at bedtime.    Marland Kitchen PARoxetine (PAXIL) 20 MG tablet Take 1 tablet by mouth once daily 90 tablet 0  . Probiotic Product (PROBIOTIC PO) Take 1 tablet by mouth daily.    Marland Kitchen  warfarin (COUMADIN) 10 MG tablet Take 1 to 1 and 1/2 tablet daily as directed by coumadin clinic 135 tablet 1  . zinc gluconate 50 MG tablet Take 50 mg by mouth daily.     No facility-administered medications prior to visit.    Allergies  Allergen Reactions  . Lisinopril Anaphylaxis and Other (See Comments)    angioedema  . Carvedilol Other (See Comments)    wheezing  . Ezetimibe-Simvastatin Other (See Comments)    Serious vertigo  . Propranolol Hcl Other (See Comments)    wheezing  . Ramipril Other (See Comments)    wheezing  . Codeine Nausea Only    Review of Systems     Objective:    Physical Exam  BP 134/76  Wt Readings from Last 3 Encounters:  05/29/19 208 lb (94.3 kg)  04/04/19 200 lb (90.7 kg)  12/13/18 195 lb (88.5 kg)    There are no preventive care reminders to display for this patient.  There are no preventive care reminders to display for this patient.   Lab Results  Component Value Date   TSH 1.14 07/27/2016   Lab Results  Component Value Date   WBC 4.0 04/21/2019   HGB 15.4 04/21/2019   HCT 44.9 04/21/2019   MCV 88 04/21/2019   PLT 183 04/21/2019   Lab Results  Component Value Date   NA 139 04/21/2019   K 4.5 04/21/2019   CO2 27 04/21/2019   GLUCOSE 85 04/21/2019   BUN 9 04/21/2019   CREATININE 0.95 04/21/2019   BILITOT 0.5 04/21/2019   ALKPHOS 91 04/21/2019   AST 59 (H) 04/21/2019   ALT 62 (H) 04/21/2019   PROT 7.4 04/21/2019   ALBUMIN 4.4 04/21/2019   CALCIUM 9.8 04/21/2019   ANIONGAP 9 02/13/2018   GFR 96.14 05/28/2014   Lab Results  Component Value Date   CHOL 162 04/21/2019   Lab Results  Component Value  Date   HDL 39 (L) 04/21/2019   Lab Results  Component Value Date   LDLCALC 97 04/21/2019   Lab Results  Component Value Date   TRIG 144 04/21/2019   Lab Results  Component Value Date   CHOLHDL 4.2 04/21/2019   Lab Results  Component Value Date   HGBA1C 5.6 10/31/2018       Assessment & Plan:   Problem List Items Addressed This Visit    None    Visit Diagnoses    Visit for suture removal    -  Primary   Laceration of left thumb without foreign body with damage to nail, initial encounter         Subjective:    Corey Mathis is a 69 y.o. male who obtained a laceration 7 days ago, which required closure with 4 sutures. Mechanism of injury: table saw. He denies pain, redness, or drainage from the wound. His last tetanus was in 2017.  The following portions of the patient's history were reviewed and updated as appropriate: allergies and current medications.  Review of Systems A comprehensive review of systems was negative.    Objective:    BP 134/76  Injury exam:  Top of the thumb is missing.  It is healing well, without evidence of infection. He did have an area along the lateral edge of the wound that looked like a almost black elongated line underneath the skin.  Unclear if it could be a piece of wood most like a splinter.  The suture near this area was easily  removed and I do not believe it is any retained suture.  Did encourage him to keep an eye on this area and let me know if it starts to look different.   Assessment:    Laceration is healing well, without evidence of infection.    Plan:     1. 4 sutures were removed. 2. Wound care discussed. 3. Follow up as needed.   4. Wound cleaned and applied vaseline and gauze and Coban dressing on the top.   No orders of the defined types were placed in this encounter.    Beatrice Lecher, MD

## 2019-06-06 ENCOUNTER — Other Ambulatory Visit: Payer: Self-pay | Admitting: Family Medicine

## 2019-06-13 ENCOUNTER — Ambulatory Visit: Payer: Medicare Other | Admitting: Nurse Practitioner

## 2019-06-17 ENCOUNTER — Ambulatory Visit: Payer: Medicare Other | Admitting: Family Medicine

## 2019-06-30 ENCOUNTER — Other Ambulatory Visit: Payer: Medicare Other

## 2019-07-11 ENCOUNTER — Other Ambulatory Visit: Payer: Self-pay | Admitting: Cardiology

## 2019-07-11 DIAGNOSIS — I428 Other cardiomyopathies: Secondary | ICD-10-CM

## 2019-07-23 ENCOUNTER — Other Ambulatory Visit: Payer: Self-pay | Admitting: Family Medicine

## 2019-07-25 ENCOUNTER — Ambulatory Visit
Admission: EM | Admit: 2019-07-25 | Discharge: 2019-07-25 | Disposition: A | Discharge status: Home / Self Care | Payer: Medicare Other | Attending: Emergency Medicine | Admitting: Emergency Medicine

## 2019-07-25 ENCOUNTER — Other Ambulatory Visit: Payer: Self-pay | Admitting: *Deleted

## 2019-07-25 ENCOUNTER — Ambulatory Visit (INDEPENDENT_AMBULATORY_CARE_PROVIDER_SITE_OTHER): Payer: Medicare Other

## 2019-07-25 DIAGNOSIS — J4530 Mild persistent asthma, uncomplicated: Secondary | ICD-10-CM

## 2019-07-25 DIAGNOSIS — J209 Acute bronchitis, unspecified: Secondary | ICD-10-CM

## 2019-07-25 DIAGNOSIS — R05 Cough: Secondary | ICD-10-CM | POA: Diagnosis not present

## 2019-07-25 DIAGNOSIS — R0989 Other specified symptoms and signs involving the circulatory and respiratory systems: Secondary | ICD-10-CM | POA: Diagnosis not present

## 2019-07-25 MED ORDER — BENZONATATE 100 MG PO CAPS: 100.0000 mg | ORAL_CAPSULE | Freq: Three times a day (TID) | ORAL | 0 refills | Status: DC

## 2019-07-25 MED ORDER — LORATADINE 10 MG PO TABS: 10.0000 mg | ORAL_TABLET | Freq: Every day | ORAL | 0 refills | Status: DC

## 2019-07-25 MED ORDER — FLUTICASONE PROPIONATE 50 MCG/ACT NA SUSP: 1.0000 | Freq: Every day | NASAL | 0 refills | Status: AC | PRN

## 2019-07-25 MED ORDER — FLUTICASONE-SALMETEROL 250-50 MCG/DOSE IN AEPB: 1.0000 | INHALATION_SPRAY | Freq: Two times a day (BID) | RESPIRATORY_TRACT | 3 refills | Status: DC

## 2019-07-25 MED ORDER — AEROCHAMBER PLUS FLO-VU MEDIUM MISC: 1.0000 | Freq: Once | 0 refills | Status: AC

## 2019-07-25 NOTE — Discharge Instructions (Signed)

## 2019-07-25 NOTE — ED Triage Notes (Signed)
Pt c/o chest congestion with slight cough x1wk after mowing the yard. States has had both covid vaccines.
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
## 2019-09-23 ENCOUNTER — Telehealth: Payer: Self-pay

## 2019-09-23 NOTE — Telephone Encounter (Signed)
Called and lmomed for overdue inr 

## 2019-10-02 ENCOUNTER — Telehealth: Payer: Self-pay

## 2019-10-02 NOTE — Telephone Encounter (Signed)
2nd lmom for overdue inr 

## 2019-10-09 ENCOUNTER — Telehealth: Payer: Self-pay | Admitting: Pharmacist

## 2019-10-09 NOTE — Telephone Encounter (Signed)
Called pt to schedule INR check since he is overdue, left message. This was 3rd attempt to reach out to pt. Will await his return call at this time.
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
## 2019-10-10 ENCOUNTER — Other Ambulatory Visit: Payer: Self-pay | Admitting: *Deleted

## 2019-10-10 ENCOUNTER — Ambulatory Visit (INDEPENDENT_AMBULATORY_CARE_PROVIDER_SITE_OTHER): Payer: Medicare Other | Admitting: Cardiology

## 2019-10-10 ENCOUNTER — Ambulatory Visit (INDEPENDENT_AMBULATORY_CARE_PROVIDER_SITE_OTHER): Payer: Medicare Other

## 2019-10-10 ENCOUNTER — Other Ambulatory Visit: Payer: Self-pay

## 2019-10-10 ENCOUNTER — Encounter: Payer: Self-pay | Admitting: Cardiology

## 2019-10-10 ENCOUNTER — Telehealth: Payer: Self-pay | Admitting: Cardiology

## 2019-10-10 VITALS — BP 134/82 | HR 63 | Ht 76.0 in | Wt 196.6 lb

## 2019-10-10 DIAGNOSIS — I712 Thoracic aortic aneurysm, without rupture, unspecified: Secondary | ICD-10-CM

## 2019-10-10 DIAGNOSIS — I10 Essential (primary) hypertension: Secondary | ICD-10-CM

## 2019-10-10 DIAGNOSIS — I428 Other cardiomyopathies: Secondary | ICD-10-CM

## 2019-10-10 DIAGNOSIS — Z952 Presence of prosthetic heart valve: Secondary | ICD-10-CM

## 2019-10-10 DIAGNOSIS — Z5181 Encounter for therapeutic drug level monitoring: Secondary | ICD-10-CM

## 2019-10-10 DIAGNOSIS — Z9889 Other specified postprocedural states: Secondary | ICD-10-CM | POA: Diagnosis not present

## 2019-10-10 DIAGNOSIS — Z7901 Long term (current) use of anticoagulants: Secondary | ICD-10-CM

## 2019-10-10 LAB — BASIC METABOLIC PANEL
BUN/Creatinine Ratio: 11 (ref 10–24)
BUN: 11 mg/dL (ref 8–27)
CO2: 31 mmol/L — ABNORMAL HIGH (ref 20–29)
Calcium: 9.8 mg/dL (ref 8.6–10.2)
Chloride: 97 mmol/L (ref 96–106)
Creatinine, Ser: 1.04 mg/dL (ref 0.76–1.27)
GFR calc Af Amer: 83 mL/min/{1.73_m2} (ref >59)
GFR calc non Af Amer: 72 mL/min/{1.73_m2} (ref >59)
Glucose: 92 mg/dL (ref 65–99)
Potassium: 4.7 mmol/L (ref 3.5–5.2)
Sodium: 136 mmol/L (ref 134–144)

## 2019-10-10 LAB — POCT INR: INR: 3.3 — AB (ref 2.0–3.0)

## 2019-10-10 MED ORDER — HYDRALAZINE HCL 50 MG PO TABS: 50.0000 mg | ORAL_TABLET | Freq: Three times a day (TID) | ORAL | 3 refills | Status: DC

## 2019-10-10 NOTE — Telephone Encounter (Signed)
Increase hydralazine to 50 mg tid Kirk Ruths

## 2019-10-10 NOTE — Patient Instructions (Signed)
continue taking 1 tablet daily. Repeat INR in 4 week

## 2019-10-10 NOTE — Telephone Encounter (Signed)
Patient is calling with dosage of his hydrALAZINE, he states it is 25mg  3x's a day.

## 2019-10-10 NOTE — Patient Instructions (Signed)
Medication Instructions:   CALL WITH HYDRALAZINE DOSE  *If you need a refill on your cardiac medications before your next appointment, please call your pharmacy*   Lab Work: If you have labs (blood work) drawn today and your tests are completely normal, you will receive your results only by: Marland Kitchen MyChart Message (if you have MyChart) OR . A paper copy in the mail If you have any lab test that is abnormal or we need to change your treatment, we will call you to review the results.   Testing/Procedures:  CTA OF THE CHEST W/WO TO FOLLOW UP AORTIC ANEURYSM AT [address]   Follow-Up: At [facility], you and your health needs are our priority.  As part of our continuing mission to provide you with exceptional heart care, we have created designated Provider Care Teams.  These Care Teams include your primary Cardiologist (physician) and Advanced Practice Providers (APPs -  Physician Assistants and Nurse Practitioners) who all work together to provide you with the care you need, when you need it.  We recommend signing up for the patient portal called "MyChart".  Sign up information is provided on this After Visit Summary.  MyChart is used to connect with patients for Virtual Visits (Telemedicine).  Patients are able to view lab/test results, encounter notes, upcoming appointments, etc.  Non-urgent messages can be sent to your provider as well.   To learn more about what you can do with MyChart, go to [email or web address].    Your next appointment:   6 month(s)  The format for your next appointment:   In Person  Provider:   You may see Kirk Ruths, MD or one of the following Advanced Practice Providers on your designated Care Team:    Kerin Ransom, PA-C  [place], [place]  Coletta Memos, [place]

## 2019-10-10 NOTE — Telephone Encounter (Signed)
Will route to primary nurse- visit today and advised to call back with dose information.  Thank you!

## 2019-10-10 NOTE — Telephone Encounter (Signed)
Left detailed message for patient of increase in medication. New script sent to the pharmacy

## 2019-10-13 ENCOUNTER — Encounter: Payer: Self-pay | Admitting: *Deleted

## 2019-10-18 ENCOUNTER — Ambulatory Visit
Admission: RE | Admit: 2019-10-18 | Discharge: 2019-10-18 | Disposition: A | Discharge status: Home / Self Care | Payer: Medicare Other | Source: Ambulatory Visit | Attending: Cardiology | Admitting: Cardiology

## 2019-10-18 DIAGNOSIS — I712 Thoracic aortic aneurysm, without rupture, unspecified: Secondary | ICD-10-CM

## 2019-10-18 DIAGNOSIS — I700 Atherosclerosis of aorta: Secondary | ICD-10-CM | POA: Diagnosis not present

## 2019-10-18 DIAGNOSIS — S2242XA Multiple fractures of ribs, left side, initial encounter for closed fracture: Secondary | ICD-10-CM | POA: Diagnosis not present

## 2019-10-18 DIAGNOSIS — J439 Emphysema, unspecified: Secondary | ICD-10-CM | POA: Diagnosis not present

## 2019-10-18 MED ORDER — IOPAMIDOL (ISOVUE-370) INJECTION 76%
75.0000 mL | Freq: Once | INTRAVENOUS | Status: AC | PRN
  Administered 2019-10-18: 75 mL via INTRAVENOUS

## 2019-10-28 ENCOUNTER — Other Ambulatory Visit: Payer: Self-pay | Admitting: Family Medicine

## 2019-10-28 ENCOUNTER — Other Ambulatory Visit: Payer: Self-pay | Admitting: Cardiology

## 2019-11-07 ENCOUNTER — Ambulatory Visit (INDEPENDENT_AMBULATORY_CARE_PROVIDER_SITE_OTHER): Payer: Medicare Other

## 2019-11-07 ENCOUNTER — Other Ambulatory Visit: Payer: Self-pay

## 2019-11-07 DIAGNOSIS — Z9889 Other specified postprocedural states: Secondary | ICD-10-CM

## 2019-11-07 DIAGNOSIS — Z5181 Encounter for therapeutic drug level monitoring: Secondary | ICD-10-CM | POA: Diagnosis not present

## 2019-11-07 DIAGNOSIS — Z7901 Long term (current) use of anticoagulants: Secondary | ICD-10-CM

## 2019-11-07 LAB — POCT INR: INR: 1.6 — AB (ref 2.0–3.0)

## 2019-11-07 NOTE — Patient Instructions (Signed)
Take 2 tablets today then continue taking 1 tablet daily. Repeat INR in 2 week

## 2019-11-21 ENCOUNTER — Ambulatory Visit (INDEPENDENT_AMBULATORY_CARE_PROVIDER_SITE_OTHER): Payer: Medicare Other

## 2019-11-21 ENCOUNTER — Other Ambulatory Visit: Payer: Self-pay

## 2019-11-21 DIAGNOSIS — Z9889 Other specified postprocedural states: Secondary | ICD-10-CM | POA: Diagnosis not present

## 2019-11-21 DIAGNOSIS — Z5181 Encounter for therapeutic drug level monitoring: Secondary | ICD-10-CM

## 2019-11-21 DIAGNOSIS — Z7901 Long term (current) use of anticoagulants: Secondary | ICD-10-CM

## 2019-11-21 LAB — POCT INR: INR: 4.4 — AB (ref 2.0–3.0)

## 2019-11-21 NOTE — Patient Instructions (Signed)
Hold today and then continue taking 1 tablet daily. Repeat INR in 4 weeks. Stay consistent with greens.

## 2019-12-19 ENCOUNTER — Ambulatory Visit (INDEPENDENT_AMBULATORY_CARE_PROVIDER_SITE_OTHER): Payer: Medicare Other

## 2019-12-19 ENCOUNTER — Other Ambulatory Visit: Payer: Self-pay

## 2019-12-19 DIAGNOSIS — Z7901 Long term (current) use of anticoagulants: Secondary | ICD-10-CM | POA: Diagnosis not present

## 2019-12-19 DIAGNOSIS — Z9889 Other specified postprocedural states: Secondary | ICD-10-CM | POA: Diagnosis not present

## 2019-12-19 DIAGNOSIS — Z5181 Encounter for therapeutic drug level monitoring: Secondary | ICD-10-CM

## 2019-12-19 LAB — POCT INR: INR: 3.3 — AB (ref 2.0–3.0)

## 2019-12-19 NOTE — Patient Instructions (Signed)
continue taking 1 tablet daily. Repeat INR in 6 weeks. Stay consistent with greens.  

## 2019-12-26 DIAGNOSIS — Z23 Encounter for immunization: Secondary | ICD-10-CM | POA: Diagnosis not present

## 2020-02-02 ENCOUNTER — Ambulatory Visit (INDEPENDENT_AMBULATORY_CARE_PROVIDER_SITE_OTHER): Payer: Medicare Other

## 2020-02-02 ENCOUNTER — Other Ambulatory Visit: Payer: Self-pay

## 2020-02-02 DIAGNOSIS — Z9889 Other specified postprocedural states: Secondary | ICD-10-CM | POA: Diagnosis not present

## 2020-02-02 DIAGNOSIS — Z5181 Encounter for therapeutic drug level monitoring: Secondary | ICD-10-CM

## 2020-02-02 DIAGNOSIS — Z7901 Long term (current) use of anticoagulants: Secondary | ICD-10-CM | POA: Diagnosis not present

## 2020-02-02 LAB — POCT INR: INR: 2.6 (ref 2.0–3.0)

## 2020-02-02 NOTE — Patient Instructions (Signed)
continue taking 1 tablet daily. Repeat INR in 6 weeks. Stay consistent with greens.

## 2020-02-16 ENCOUNTER — Other Ambulatory Visit: Payer: Self-pay | Admitting: Family Medicine

## 2020-03-15 ENCOUNTER — Ambulatory Visit (INDEPENDENT_AMBULATORY_CARE_PROVIDER_SITE_OTHER): Payer: Medicare Other

## 2020-03-15 ENCOUNTER — Other Ambulatory Visit: Payer: Self-pay

## 2020-03-15 DIAGNOSIS — Z5181 Encounter for therapeutic drug level monitoring: Secondary | ICD-10-CM

## 2020-03-15 DIAGNOSIS — Z7901 Long term (current) use of anticoagulants: Secondary | ICD-10-CM | POA: Diagnosis not present

## 2020-03-15 DIAGNOSIS — Z9889 Other specified postprocedural states: Secondary | ICD-10-CM | POA: Diagnosis not present

## 2020-03-15 LAB — POCT INR: INR: 3.7 — AB (ref 2.0–3.0)

## 2020-03-15 NOTE — Patient Instructions (Signed)
continue taking 1 tablet daily. Repeat INR in 6 weeks. Stay consistent with greens, but eat greens over the next 2 days

## 2020-04-26 ENCOUNTER — Ambulatory Visit (INDEPENDENT_AMBULATORY_CARE_PROVIDER_SITE_OTHER): Payer: Medicare Other

## 2020-04-26 ENCOUNTER — Other Ambulatory Visit: Payer: Self-pay

## 2020-04-26 DIAGNOSIS — Z7901 Long term (current) use of anticoagulants: Secondary | ICD-10-CM | POA: Diagnosis not present

## 2020-04-26 DIAGNOSIS — Z9889 Other specified postprocedural states: Secondary | ICD-10-CM | POA: Diagnosis not present

## 2020-04-26 DIAGNOSIS — Z5181 Encounter for therapeutic drug level monitoring: Secondary | ICD-10-CM

## 2020-04-26 LAB — POCT INR: INR: 3.2 — AB (ref 2.0–3.0)

## 2020-04-26 NOTE — Patient Instructions (Signed)
continue taking 1 tablet daily. Repeat INR in 6 weeks.   

## 2020-05-10 ENCOUNTER — Telehealth: Payer: Self-pay | Admitting: Family Medicine

## 2020-05-10 DIAGNOSIS — J4530 Mild persistent asthma, uncomplicated: Secondary | ICD-10-CM

## 2020-05-10 MED ORDER — BREO ELLIPTA 100-25 MCG/INH IN AEPB: 1.0000 | INHALATION_SPRAY | Freq: Every day | RESPIRATORY_TRACT | 3 refills | Status: DC

## 2020-05-10 NOTE — Telephone Encounter (Signed)
[place] changed to [employer].

## 2020-05-18 ENCOUNTER — Encounter: Payer: Self-pay | Admitting: Cardiology

## 2020-05-18 ENCOUNTER — Telehealth (INDEPENDENT_AMBULATORY_CARE_PROVIDER_SITE_OTHER): Payer: Medicare Other | Admitting: Cardiology

## 2020-05-18 VITALS — BP 140/88 | Ht 76.0 in | Wt 190.0 lb

## 2020-05-18 DIAGNOSIS — I428 Other cardiomyopathies: Secondary | ICD-10-CM | POA: Diagnosis not present

## 2020-05-18 DIAGNOSIS — Z952 Presence of prosthetic heart valve: Secondary | ICD-10-CM | POA: Diagnosis not present

## 2020-05-18 DIAGNOSIS — I10 Essential (primary) hypertension: Secondary | ICD-10-CM

## 2020-05-18 DIAGNOSIS — E7800 Pure hypercholesterolemia, unspecified: Secondary | ICD-10-CM | POA: Diagnosis not present

## 2020-05-18 DIAGNOSIS — I712 Thoracic aortic aneurysm, without rupture, unspecified: Secondary | ICD-10-CM

## 2020-05-18 MED ORDER — ISOSORBIDE MONONITRATE ER 60 MG PO TB24: 60.0000 mg | ORAL_TABLET | Freq: Every day | ORAL | 3 refills | Status: DC

## 2020-05-18 MED ORDER — ATORVASTATIN CALCIUM 40 MG PO TABS: 40.0000 mg | ORAL_TABLET | Freq: Every day | ORAL | 3 refills | Status: DC

## 2020-05-18 NOTE — Progress Notes (Signed)
Virtual Visit via Video Note   This visit type was conducted due to national recommendations for restrictions regarding the COVID-19 Pandemic (e.g. social distancing) in an effort to limit this patient's exposure and mitigate transmission in our community.  Due to his co-morbid illnesses, this patient is at least at moderate risk for complications without adequate follow up.  This format is felt to be most appropriate for this patient at this time.  All issues noted in this document were discussed and addressed.  A limited physical exam was performed with this format.  Please refer to the patient's chart for his consent to telehealth for [facility].      Date:  05/18/2020   ID:  Corey Mathis, DOB 12-08-1950, MRN [id number]  Patient Location:Home Provider Location: Home  PCP:  [name], MD  Cardiologist:  Dr [name]  Evaluation Performed:  Follow-Up Visit  Chief Complaint:  FU MVR  History of Present Illness:    FU mitral valve replacement with a St. Jude valve in 1998and cardiomyopathy. Abdominal ultrasound November 2015 showed no aneurysm. Echo December 2018 showed ejection fraction 35-40% with diffuse hypokinesis. The ascending aorta measured 43 mm. There was a mechanical valve with mean gradient 3 mmHg. Mild biatrial enlargement. Cardiac catheterization May 2019 showed moderate nonobstructive coronary disease and ejection fraction greater than 65%. Follow-up echo January 2020 showed ejection fraction 40 to 82%, mild diastolic dysfunction, St Jude mechanical mitral valve with mean gradient 4 mmHg, mild left atrial enlargement and mild right ventricular enlargement. CTA August 2021 showed mild fusiform dilatation of the ascending thoracic aorta measuring 4 cm.  Since I last saw him,the patient has dyspnea with more extreme activities but not with routine activities. It is relieved with rest. It is not associated with chest pain. There is no orthopnea, PND or pedal  edema. There is no syncope or palpitations. There is no exertional chest pain.   The patient does not have symptoms concerning for COVID-19 infection (fever, chills, cough, or new shortness of breath).    Past Medical History:  Diagnosis Date   Allergy    Anxiety    Asthma    CHF (congestive heart failure) (HCC)    Dyslipidemia    History of mitral valve replacement    Hypertension    Labyrinthitis    hx   Past Surgical History:  Procedure Laterality Date   ACNE CYST REMOVAL     from hand and back   EXTERNAL FIXATION LEG Right 08/11/2017   Procedure: EXTERNAL FIXATION, RIGHT KNEE;  Surgeon: Leandrew Koyanagi, MD;  Location: [place];  Service: Orthopedics;  Laterality: Right;   FINE NEEDLE ASPIRATION Right 08/11/2017   Procedure: FINE NEEDLE ASPIRATION  of right Knee with 80 cc of dark red fluid removed;  Surgeon: Leandrew Koyanagi, MD;  Location: [place];  Service: Orthopedics;  Laterality: Right;   FLEXIBLE SIGMOIDOSCOPY N/A 02/11/2018   Procedure: FLEXIBLE SIGMOIDOSCOPY;  Surgeon: Doran Stabler, MD;  Location: [place];  Service: Gastroenterology;  Laterality: N/A;   Jaw surgery (other)     NASAL SINUS SURGERY N/A 10/16/2012   Procedure: NASAL ENDOSCOPIC POLYPECTOMY/MAXILLARY ANTROSTOMY/ETHMOIDECTOMY;  Surgeon: Izora Gala, MD;  Location: [facility] OR;  Service: ENT;  Laterality: N/A;   ORIF TIBIA PLATEAU Left 08/13/2017   Procedure: OPEN REDUCTION INTERNAL FIXATION (ORIF) TIBIAL PLATEAU;  Surgeon: Leandrew Koyanagi, MD;  Location: [place];  Service: Orthopedics;  Laterality: Left;   ORIF TIBIA PLATEAU Right 08/22/2017  Procedure: OPEN REDUCTION INTERNAL FIXATION (ORIF) RIGHT BICONDYLAR TIBIAL PLATEAU, REMOVAL OF EX FIX;  Surgeon: Leandrew Koyanagi, MD;  Location: [place];  Service: Orthopedics;  Laterality: Right;   RIGHT/LEFT HEART CATH AND CORONARY ANGIOGRAPHY N/A 07/12/2017   Procedure: RIGHT/LEFT HEART CATH AND CORONARY ANGIOGRAPHY;  Surgeon: Burnell Blanks, MD;  Location: [place] CV LAB;  Service: Cardiovascular;  Laterality: N/A;   SUBMUCOSAL INJECTION  02/11/2018   Procedure: SUBMUCOSAL INJECTION;  Surgeon: Doran Stabler, MD;  Location: [place];  Service: Gastroenterology;;   TOOTH EXTRACTION     VALVE REPLACEMENT  1998   St. Jude, mitral     Current Meds  Medication Sig   albuterol (PROAIR HFA) 108 (90 Base) MCG/ACT inhaler Inhale 2 puffs into the lungs every 6 (six) hours as needed for wheezing.   antiseptic oral rinse (BIOTENE) LIQD 5 mLs by Mouth Rinse route 2 (two) times daily.   aspirin 81 MG EC tablet Take 81 mg by mouth at bedtime.   cholecalciferol (VITAMIN D) 1000 units tablet Take 1,000 Units by mouth at bedtime.    COENZYME Q10 PO Take 1 capsule by mouth at bedtime.    fluticasone (FLONASE) 50 MCG/ACT nasal spray Place 1 spray into both nostrils daily as needed (congestion).   fluticasone furoate-vilanterol (BREO ELLIPTA) 100-25 MCG/INH AEPB Inhale 1 puff into the lungs daily.   hydrALAZINE (APRESOLINE) 50 MG tablet Take 100 mg by mouth 3 (three) times daily.   isosorbide mononitrate (IMDUR) 30 MG 24 hr tablet Take 1 tablet by mouth once daily   loratadine (CLARITIN) 10 MG tablet Take 1 tablet (10 mg total) by mouth daily.   Melatonin 10 MG TABS Take 1 tablet by mouth as needed.   metoprolol succinate (TOPROL-XL) 100 MG 24 hr tablet Take 1 tablet by mouth once daily   metoprolol succinate (TOPROL-XL) 50 MG 24 hr tablet TAKE 1 TABLET BY MOUTH ONCE DAILY WITH  A  100  MG  TABLET  DAILY  AT  BEDTIME  FOR  A  TOTAL  DOSE  OF  150  MG   Multiple Vitamin (MULTIVITAMIN WITH MINERALS) TABS tablet Take 1 tablet by mouth at bedtime.   PARoxetine (PAXIL) 20 MG tablet Take 1 tablet by mouth once daily   Probiotic Product (PROBIOTIC PO) Take 1 tablet by mouth daily.   vitamin C (ASCORBIC ACID) 500 MG tablet Take 500 mg by mouth daily.   warfarin (COUMADIN) 10 MG tablet Take 1 to 1 and 1/2 tablet daily as directed by coumadin  clinic   [DISCONTINUED] hydrALAZINE (APRESOLINE) 50 MG tablet Take 1 tablet (50 mg total) by mouth 3 (three) times daily.   [DISCONTINUED] zinc gluconate 50 MG tablet Take 50 mg by mouth daily.     Allergies:   Lisinopril, Carvedilol, Ezetimibe-simvastatin, Propranolol hcl, Ramipril, and Codeine   Social History   Tobacco Use   Smoking status: Never Smoker   Smokeless tobacco: Never Used  Vaping Use   Vaping Use: Never used  Substance Use Topics   Alcohol use: Never   Drug use: Never     Family Hx: The patient's family history includes Breast cancer in his maternal aunt; Colon cancer (age of onset: 74) in his mother; Heart attack (age of onset: 64) in his father; Hypertension in his mother. There is no history of Stomach cancer, Esophageal cancer, Inflammatory bowel disease, Liver disease, or Pancreatic cancer.  ROS:   Please see the history of present illness.  No Fever, chills  or productive cough All other systems reviewed and are negative.  Recent Labs: 10/10/2019: BUN 11; Creatinine, Ser 1.04; Potassium 4.7; Sodium 136   Recent Lipid Panel Lab Results  Component Value Date/Time   CHOL 162 04/21/2019 12:33 PM   TRIG 144 04/21/2019 12:33 PM   HDL 39 (L) 04/21/2019 12:33 PM   CHOLHDL 4.2 04/21/2019 12:33 PM   CHOLHDL 5.0 (H) 11/15/2018 11:24 AM   LDLCALC 97 04/21/2019 12:33 PM   LDLCALC 144 (H) 11/15/2018 11:24 AM    Wt Readings from Last 3 Encounters:  05/18/20 190 lb (86.2 kg)  10/10/19 196 lb 9.6 oz (89.2 kg)  05/29/19 208 lb (94.3 kg)     Objective:    Vital Signs:  BP 140/88    Ht 6\' 4"  (1.93 m)    Wt 190 lb (86.2 kg)    BMI 23.13 kg/m    VITAL SIGNS:  reviewed NAD Answers questions appropriately Normal affect Remainder of physical examination not performed (telehealth visit; coronavirus pandemic)  ASSESSMENT & PLAN:    1. Nonischemic cardiomyopathy-continue Toprol and hydralazine/nitrates.  Patient noted to have angioedema in the past with  ACE inhibitor's.  Repeat echocardiogram. 2. Previous mitral valve replacement-continue Coumadin and aspirin.  Continue SBE prophylaxis.  Check hemoglobin. 3. Dilated aortic root-plan follow-up CTA August 2022. 4. Hypertension-blood pressure mildly elevated.  Increase isosorbide to 60 mg daily and follow.  Check renal function. 5. Hyperlipidemia-resume Lipitor 40 mg daily.  Check lipids and liver in 12 weeks. 6. Coronary artery disease-resume statin.  COVID-19 Education: The importance of social distancing was discussed today.  Time:   Today, I have spent 16 minutes with the patient with telehealth technology discussing the above problems.     Medication Adjustments/Labs and Tests Ordered: Current medicines are reviewed at length with the patient today.  Concerns regarding medicines are outlined above.   Tests Ordered: No orders of the defined types were placed in this encounter.   Medication Changes: No orders of the defined types were placed in this encounter.   Follow Up:  In Person in 1 year(s)  Signed, Kirk Ruths, MD  05/18/2020 8:36 AM    [place]

## 2020-05-18 NOTE — Patient Instructions (Signed)
Medication Instructions:   INCREASE ISOSORBIDE TO 60 MG ONCE DAILY= 2 OF THE 30 MG TABLETS ONCE DAILY  START ATORVASTATIN 40 MG ONCE DAILY  *If you need a refill on your cardiac medications before your next appointment, please call your pharmacy*   Lab Work:  Your physician recommends that you return for lab work in: 3 MONTHS-FASTING  If you have labs (blood work) drawn today and your tests are completely normal, you will receive your results only by: Marland Kitchen MyChart Message (if you have MyChart) OR . A paper copy in the mail If you have any lab test that is abnormal or we need to change your treatment, we will call you to review the results.   Testing/Procedures:  Your physician has requested that you have an echocardiogram. Echocardiography is a painless test that uses sound waves to create images of your heart. It provides your doctor with information about the size and shape of your heart and how well your heart's chambers and valves are working. This procedure takes approximately one hour. There are no restrictions for this procedure.[place]     Follow-Up: At [facility], you and your health needs are our priority.  As part of our continuing mission to provide you with exceptional heart care, we have created designated Provider Care Teams.  These Care Teams include your primary Cardiologist (physician) and Advanced Practice Providers (APPs -  Physician Assistants and Nurse Practitioners) who all work together to provide you with the care you need, when you need it.  We recommend signing up for the patient portal called "MyChart".  Sign up information is provided on this After Visit Summary.  MyChart is used to connect with patients for Virtual Visits (Telemedicine).  Patients are able to view lab/test results, encounter notes, upcoming appointments, etc.  Non-urgent messages can be sent to your provider as well.   To learn more about what you can do with MyChart, go to  [email or web address].    Your next appointment:   6 month(s)  The format for your next appointment:   In Person  Provider:   Kirk Ruths, MD

## 2020-05-27 ENCOUNTER — Other Ambulatory Visit: Payer: Self-pay | Admitting: Family Medicine

## 2020-06-07 ENCOUNTER — Other Ambulatory Visit: Payer: Self-pay

## 2020-06-07 ENCOUNTER — Ambulatory Visit (INDEPENDENT_AMBULATORY_CARE_PROVIDER_SITE_OTHER): Payer: Medicare Other

## 2020-06-07 DIAGNOSIS — Z5181 Encounter for therapeutic drug level monitoring: Secondary | ICD-10-CM

## 2020-06-07 DIAGNOSIS — Z7901 Long term (current) use of anticoagulants: Secondary | ICD-10-CM

## 2020-06-07 DIAGNOSIS — Z9889 Other specified postprocedural states: Secondary | ICD-10-CM | POA: Diagnosis not present

## 2020-06-07 LAB — POCT INR: INR: 3.3 — AB (ref 2.0–3.0)

## 2020-06-07 NOTE — Patient Instructions (Signed)
continue taking 1 tablet daily. Repeat INR in 6 weeks.

## 2020-06-30 ENCOUNTER — Other Ambulatory Visit: Payer: Self-pay | Admitting: Family Medicine

## 2020-07-05 ENCOUNTER — Other Ambulatory Visit: Payer: Self-pay | Admitting: Family Medicine

## 2020-07-08 ENCOUNTER — Other Ambulatory Visit: Payer: Self-pay | Admitting: *Deleted

## 2020-07-11 ENCOUNTER — Other Ambulatory Visit: Payer: Self-pay | Admitting: Cardiology

## 2020-07-13 ENCOUNTER — Other Ambulatory Visit: Payer: Self-pay

## 2020-07-13 MED ORDER — WARFARIN SODIUM 10 MG PO TABS: ORAL_TABLET | ORAL | 0 refills | Status: DC

## 2020-07-19 ENCOUNTER — Ambulatory Visit (INDEPENDENT_AMBULATORY_CARE_PROVIDER_SITE_OTHER): Payer: Medicare Other

## 2020-07-19 ENCOUNTER — Other Ambulatory Visit: Payer: Self-pay

## 2020-07-19 ENCOUNTER — Ambulatory Visit
Admission: EM | Admit: 2020-07-19 | Discharge: 2020-07-19 | Disposition: A | Discharge status: Home / Self Care | Payer: Medicare Other | Attending: Physician Assistant | Admitting: Physician Assistant

## 2020-07-19 ENCOUNTER — Encounter: Payer: Self-pay | Admitting: Emergency Medicine

## 2020-07-19 DIAGNOSIS — Z5181 Encounter for therapeutic drug level monitoring: Secondary | ICD-10-CM

## 2020-07-19 DIAGNOSIS — Z7901 Long term (current) use of anticoagulants: Secondary | ICD-10-CM

## 2020-07-19 DIAGNOSIS — J4521 Mild intermittent asthma with (acute) exacerbation: Secondary | ICD-10-CM | POA: Diagnosis not present

## 2020-07-19 DIAGNOSIS — Z9889 Other specified postprocedural states: Secondary | ICD-10-CM | POA: Diagnosis not present

## 2020-07-19 LAB — POCT INR: INR: 2 (ref 2.0–3.0)

## 2020-07-19 MED ORDER — BENZONATATE 100 MG PO CAPS: 100.0000 mg | ORAL_CAPSULE | Freq: Three times a day (TID) | ORAL | 0 refills | Status: DC

## 2020-07-19 MED ORDER — PREDNISONE 10 MG (21) PO TBPK: ORAL_TABLET | Freq: Every day | ORAL | 0 refills | Status: DC

## 2020-07-19 NOTE — Patient Instructions (Addendum)
Take 2 tablets tonight only and then continue taking 1 tablet daily. Repeat INR in 6 weeks.

## 2020-07-19 NOTE — Discharge Instructions (Addendum)
Take medications as prescribed Return for chest xray if you experience fever, chills, night sweats, worsening symptoms.  Push fluids.  Continue with mucinex and flonase.
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
## 2020-07-19 NOTE — ED Triage Notes (Signed)
Patient reports having a cold 2 weeks ago.  Patient continues to have post nasal drip and some congestion that is particularly frustrating at night.  Patient has been taking mucinex and coricidin d .   Patient also mentions pcp placing him back on Lipitor about month ago.

## 2020-08-06 ENCOUNTER — Other Ambulatory Visit: Payer: Self-pay | Admitting: Cardiology

## 2020-08-06 ENCOUNTER — Other Ambulatory Visit: Payer: Self-pay | Admitting: Family Medicine

## 2020-08-06 DIAGNOSIS — I428 Other cardiomyopathies: Secondary | ICD-10-CM

## 2020-08-11 ENCOUNTER — Other Ambulatory Visit: Payer: Self-pay

## 2020-08-11 ENCOUNTER — Ambulatory Visit (HOSPITAL_COMMUNITY): Payer: Medicare Other | Attending: Cardiovascular Disease

## 2020-08-11 DIAGNOSIS — I428 Other cardiomyopathies: Secondary | ICD-10-CM | POA: Diagnosis not present

## 2020-08-11 DIAGNOSIS — Z952 Presence of prosthetic heart valve: Secondary | ICD-10-CM

## 2020-08-11 LAB — ECHOCARDIOGRAM COMPLETE
Area-P 1/2: 1.75 cm2
S' Lateral: 3.7 cm

## 2020-08-16 DIAGNOSIS — Z23 Encounter for immunization: Secondary | ICD-10-CM | POA: Diagnosis not present

## 2020-08-17 ENCOUNTER — Other Ambulatory Visit: Payer: Self-pay | Admitting: Family Medicine

## 2020-08-20 ENCOUNTER — Other Ambulatory Visit: Payer: Self-pay | Admitting: Family Medicine

## 2020-08-23 ENCOUNTER — Ambulatory Visit (INDEPENDENT_AMBULATORY_CARE_PROVIDER_SITE_OTHER): Payer: Medicare Other | Admitting: Physician Assistant

## 2020-08-23 ENCOUNTER — Other Ambulatory Visit: Payer: Self-pay

## 2020-08-23 ENCOUNTER — Encounter: Payer: Self-pay | Admitting: Physician Assistant

## 2020-08-23 VITALS — BP 148/88 | HR 72 | Ht 76.0 in | Wt 207.0 lb

## 2020-08-23 DIAGNOSIS — J4530 Mild persistent asthma, uncomplicated: Secondary | ICD-10-CM

## 2020-08-23 DIAGNOSIS — I10 Essential (primary) hypertension: Secondary | ICD-10-CM

## 2020-08-23 MED ORDER — FLUTICASONE-SALMETEROL 250-50 MCG/ACT IN AEPB: 1.0000 | INHALATION_SPRAY | Freq: Two times a day (BID) | RESPIRATORY_TRACT | 11 refills | Status: DC
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
## 2020-08-24 ENCOUNTER — Encounter: Payer: Self-pay | Admitting: Physician Assistant

## 2020-08-30 ENCOUNTER — Other Ambulatory Visit: Payer: Self-pay

## 2020-08-30 ENCOUNTER — Ambulatory Visit (INDEPENDENT_AMBULATORY_CARE_PROVIDER_SITE_OTHER): Payer: Medicare Other | Admitting: Pharmacist

## 2020-08-30 DIAGNOSIS — Z9889 Other specified postprocedural states: Secondary | ICD-10-CM

## 2020-08-30 DIAGNOSIS — Z7901 Long term (current) use of anticoagulants: Secondary | ICD-10-CM | POA: Diagnosis not present

## 2020-08-30 LAB — POCT INR: INR: 5.2 — AB (ref 2.0–3.0)

## 2020-09-13 ENCOUNTER — Other Ambulatory Visit: Payer: Self-pay

## 2020-09-13 ENCOUNTER — Ambulatory Visit (INDEPENDENT_AMBULATORY_CARE_PROVIDER_SITE_OTHER): Payer: Medicare Other

## 2020-09-13 DIAGNOSIS — Z7901 Long term (current) use of anticoagulants: Secondary | ICD-10-CM

## 2020-09-13 DIAGNOSIS — Z5181 Encounter for therapeutic drug level monitoring: Secondary | ICD-10-CM | POA: Diagnosis not present

## 2020-09-13 DIAGNOSIS — Z9889 Other specified postprocedural states: Secondary | ICD-10-CM | POA: Diagnosis not present

## 2020-09-13 LAB — POCT INR: INR: 2.9 (ref 2.0–3.0)

## 2020-09-13 NOTE — Patient Instructions (Signed)
Continue taking 1 tablet daily. Repeat INR in 6 weeks.

## 2020-09-20 ENCOUNTER — Encounter: Payer: Self-pay | Admitting: *Deleted

## 2020-09-30 ENCOUNTER — Other Ambulatory Visit: Payer: Self-pay | Admitting: *Deleted

## 2020-09-30 DIAGNOSIS — I712 Thoracic aortic aneurysm, without rupture, unspecified: Secondary | ICD-10-CM

## 2020-10-04 ENCOUNTER — Other Ambulatory Visit: Payer: Self-pay | Admitting: Cardiology

## 2020-10-04 DIAGNOSIS — I428 Other cardiomyopathies: Secondary | ICD-10-CM

## 2020-10-06 DIAGNOSIS — Z20822 Contact with and (suspected) exposure to covid-19: Secondary | ICD-10-CM | POA: Diagnosis not present

## 2020-10-18 ENCOUNTER — Other Ambulatory Visit: Payer: Self-pay | Admitting: Family Medicine

## 2020-10-18 DIAGNOSIS — J4530 Mild persistent asthma, uncomplicated: Secondary | ICD-10-CM

## 2020-10-20 ENCOUNTER — Ambulatory Visit
Admission: RE | Admit: 2020-10-20 | Discharge: 2020-10-20 | Disposition: A | Discharge status: Home / Self Care | Payer: Medicare Other | Source: Ambulatory Visit | Attending: Cardiology | Admitting: Cardiology

## 2020-10-20 DIAGNOSIS — E7800 Pure hypercholesterolemia, unspecified: Secondary | ICD-10-CM | POA: Diagnosis not present

## 2020-10-20 DIAGNOSIS — I712 Thoracic aortic aneurysm, without rupture, unspecified: Secondary | ICD-10-CM

## 2020-10-20 DIAGNOSIS — I428 Other cardiomyopathies: Secondary | ICD-10-CM | POA: Diagnosis not present

## 2020-10-20 DIAGNOSIS — I2510 Atherosclerotic heart disease of native coronary artery without angina pectoris: Secondary | ICD-10-CM | POA: Diagnosis not present

## 2020-10-20 DIAGNOSIS — I700 Atherosclerosis of aorta: Secondary | ICD-10-CM | POA: Diagnosis not present

## 2020-10-20 MED ORDER — IOPAMIDOL (ISOVUE-370) INJECTION 76%
75.0000 mL | Freq: Once | INTRAVENOUS | Status: AC | PRN
  Administered 2020-10-20: 75 mL via INTRAVENOUS

## 2020-10-21 ENCOUNTER — Other Ambulatory Visit: Payer: Self-pay | Admitting: Family Medicine

## 2020-10-21 ENCOUNTER — Encounter: Payer: Self-pay | Admitting: *Deleted

## 2020-10-21 DIAGNOSIS — J4530 Mild persistent asthma, uncomplicated: Secondary | ICD-10-CM

## 2020-10-21 LAB — COMPREHENSIVE METABOLIC PANEL
ALT: 35 IU/L (ref 0–44)
AST: 46 IU/L — ABNORMAL HIGH (ref 0–40)
Albumin/Globulin Ratio: 1.6 (ref 1.2–2.2)
Albumin: 4.4 g/dL (ref 3.7–4.7)
Alkaline Phosphatase: 85 IU/L (ref 44–121)
BUN/Creatinine Ratio: 6 — ABNORMAL LOW (ref 10–24)
BUN: 6 mg/dL — ABNORMAL LOW (ref 8–27)
Bilirubin Total: 0.9 mg/dL (ref 0.0–1.2)
CO2: 25 mmol/L (ref 20–29)
Calcium: 9 mg/dL (ref 8.6–10.2)
Chloride: 100 mmol/L (ref 96–106)
Creatinine, Ser: 0.96 mg/dL (ref 0.76–1.27)
Globulin, Total: 2.8 g/dL (ref 1.5–4.5)
Glucose: 83 mg/dL (ref 65–99)
Potassium: 4.5 mmol/L (ref 3.5–5.2)
Sodium: 138 mmol/L (ref 134–144)
Total Protein: 7.2 g/dL (ref 6.0–8.5)
eGFR: 84 mL/min/{1.73_m2} (ref >59)

## 2020-10-21 LAB — LIPID PANEL
Chol/HDL Ratio: 3.4 ratio (ref 0.0–5.0)
Cholesterol, Total: 124 mg/dL (ref 100–199)
HDL: 37 mg/dL — ABNORMAL LOW (ref >39)
LDL Chol Calc (NIH): 69 mg/dL (ref 0–99)
Triglycerides: 95 mg/dL (ref 0–149)
VLDL Cholesterol Cal: 18 mg/dL (ref 5–40)

## 2020-10-21 LAB — CBC
Hematocrit: 39.1 % (ref 37.5–51.0)
Hemoglobin: 13 g/dL (ref 13.0–17.7)
MCH: 29.4 pg (ref 26.6–33.0)
MCHC: 33.2 g/dL (ref 31.5–35.7)
MCV: 89 fL (ref 79–97)
Platelets: 161 10*3/uL (ref 150–450)
RBC: 4.42 x10E6/uL (ref 4.14–5.80)
RDW: 13.2 % (ref 11.6–15.4)
WBC: 3.5 10*3/uL (ref 3.4–10.8)

## 2020-10-21 NOTE — Telephone Encounter (Signed)
Pt had called and asked that his Advair be sent to [facility] because it is cheaper for him to get it filled there.   Called and spoke w/Chelsea at [facility] and asked that the Advair be taken off of his profile.   Sent refill to [facility] for pt.

## 2020-10-29 ENCOUNTER — Ambulatory Visit (INDEPENDENT_AMBULATORY_CARE_PROVIDER_SITE_OTHER): Payer: Medicare Other | Admitting: Family Medicine

## 2020-10-29 DIAGNOSIS — Z0000 Encounter for general adult medical examination without abnormal findings: Secondary | ICD-10-CM

## 2020-10-29 NOTE — Patient Instructions (Addendum)
[facility] Maintenance Summary and Written Plan of Care  Corey Mathis ,  Thank you for allowing me to perform your Medicare Annual Wellness Visit and for your ongoing commitment to your health.   Health Maintenance & Immunization History Health Maintenance  Topic Date Due   Zoster Vaccines- Shingrix (2 of 2) 01/29/2021 (Originally 03/22/2019)   INFLUENZA VACCINE  06/03/2021 (Originally 10/04/2020)   COLONOSCOPY (Pts 45-59yr Insurance coverage will need to be confirmed)  02/07/2021   TETANUS/TDAP  08/09/2025   COVID-19 Vaccine  Completed   Hepatitis C Screening  Completed   PNA vac Low Risk Adult  Completed   HPV VACCINES  Aged Out   Immunization History  Administered Date(s) Administered   Fluad Quad(high Dose 65+) 10/31/2018   Influenza Split 02/10/2011   Influenza, High Dose Seasonal PF 11/04/2016, 11/26/2017   Influenza,inj,Quad PF,6+ Mos 01/13/2013, 01/26/2014, 01/12/2015   Influenza-Unspecified 12/26/2019   PFIZER(Purple Top)SARS-COV-2 Vaccination 04/07/2019, 04/29/2019, 12/26/2019, 08/16/2020   Pneumococcal Conjugate-13 09/23/2013   Pneumococcal Polysaccharide-23 03/13/2011, 05/27/2018   Pneumococcal-Unspecified 05/10/2016   Tdap 03/07/1999, 08/10/2015   Zoster Recombinat (Shingrix) 01/25/2019    These are the patient goals that we discussed:  Goals Addressed               This Visit's Progress     Patient Stated (pt-stated)        He would like get all of his dental work done.         This is a list of Health Maintenance Items that are overdue or due now: Influenza vaccine Shingles vaccine 2nd dose Colonoscopy will be due in December, 2022  Orders/Referrals Placed Today: No orders of the defined types were placed in this encounter.  (Contact our referral department at [phone] you have not spoken with someone about your referral appointment within the next 5 days)    Follow-up Plan Follow-up with MHali Marry MD as planned Schedule a nurse visit for your flu shot.  Schedule an appointment for your second dose shingles shot at your pharmacy.  Medicare wellness visit in one year.  AVS printed and mailed to the patient.     Health Maintenance, Male Adopting a healthy lifestyle and getting preventive care are important in promoting health and wellness. Ask your health care provider about: The right schedule for you to have regular tests and exams. Things you can do on your own to prevent diseases and keep yourself healthy. What should I know about diet, weight, and exercise? Eat a healthy diet  Eat a diet that includes plenty of vegetables, fruits, low-fat dairy products, and lean protein. Do not eat a lot of foods that are high in solid fats, added sugars, or sodium.  Maintain a healthy weight Body mass index (BMI) is a measurement that can be used to identify possible weight problems. It estimates body fat based on height and weight. Your health care provider can help determine your BMI and help you achieve or maintain ahealthy weight. Get regular exercise Get regular exercise. This is one of the most important things you can do for your health. Most adults should: Exercise for at least 150 minutes each week. The exercise should increase your heart rate and make you sweat (moderate-intensity exercise). Do strengthening exercises at least twice a week. This is in addition to the moderate-intensity exercise. Spend less time sitting. Even light physical activity can be beneficial. Watch cholesterol and blood lipids Have your blood tested for lipids and  cholesterol at 72 years of age, then havethis test every 5 years. You may need to have your cholesterol levels checked more often if: Your lipid or cholesterol levels are high. You are older than 72 years of age. You are at high risk for heart disease. What should I know about cancer screening? Many types of cancers can be detected early and  may often be prevented. Depending on your health history and family history, you may need to have cancer screening at various ages. This may include screening for: Colorectal cancer. Prostate cancer. Skin cancer. Lung cancer. What should I know about heart disease, diabetes, and high blood pressure? Blood pressure and heart disease High blood pressure causes heart disease and increases the risk of stroke. This is more likely to develop in people who have high blood pressure readings, are of African descent, or are overweight. Talk with your health care provider about your target blood pressure readings. Have your blood pressure checked: Every 3-5 years if you are 40-77 years of age. Every year if you are 32 years old or older. If you are between the ages of 64 and 44 and are a current or former smoker, ask your health care provider if you should have a one-time screening for abdominal aortic aneurysm (AAA). Diabetes Have regular diabetes screenings. This checks your fasting blood sugar level. Have the screening done: Once every three years after age 59 if you are at a normal weight and have a low risk for diabetes. More often and at a younger age if you are overweight or have a high risk for diabetes. What should I know about preventing infection? Hepatitis B If you have a higher risk for hepatitis B, you should be screened for this virus. Talk with your health care provider to find out if you are at risk forhepatitis B infection. Hepatitis C Blood testing is recommended for: Everyone born from [birth year] through 1967. Anyone with known risk factors for hepatitis C. Sexually transmitted infections (STIs) You should be screened each year for STIs, including gonorrhea and chlamydia, if: You are sexually active and are younger than 72 years of age. You are older than 72 years of age and your health care provider tells you that you are at risk for this type of infection. Your sexual activity has  changed since you were last screened, and you are at increased risk for chlamydia or gonorrhea. Ask your health care provider if you are at risk. Ask your health care provider about whether you are at high risk for HIV. Your health care provider may recommend a prescription medicine to help prevent HIV infection. If you choose to take medicine to prevent HIV, you should first get tested for HIV. You should then be tested every 3 months for as long as you are taking the medicine. Follow these instructions at home: Lifestyle Do not use any products that contain nicotine or tobacco, such as cigarettes, e-cigarettes, and chewing tobacco. If you need help quitting, ask your health care provider. Do not use street drugs. Do not share needles. Ask your health care provider for help if you need support or information about quitting drugs. Alcohol use Do not drink alcohol if your health care provider tells you not to drink. If you drink alcohol: Limit how much you have to 0-2 drinks a day. Be aware of how much alcohol is in your drink. In the [place] one drink equals one 12 oz bottle of beer (355 mL), one 5 oz glass  of wine (148 mL), or one 1 oz glass of hard liquor (44 mL). General instructions Schedule regular health, dental, and eye exams. Stay current with your vaccines. Tell your health care provider if: You often feel depressed. You have ever been abused or do not feel safe at home. Summary Adopting a healthy lifestyle and getting preventive care are important in promoting health and wellness. Follow your health care provider's instructions about healthy diet, exercising, and getting tested or screened for diseases. Follow your health care provider's instructions on monitoring your cholesterol and blood pressure. This information is not intended to replace advice given to you by your health care provider. Make sure you discuss any questions you have with your healthcare provider. Document  Revised: 02/13/2018 Document Reviewed: 02/13/2018 Elsevier Patient Education  2022 [employer].

## 2020-10-29 NOTE — Progress Notes (Signed)
MEDICARE ANNUAL WELLNESS VISIT  10/29/2020  Telephone Visit Disclaimer This Medicare AWV was conducted by telephone due to national recommendations for restrictions regarding the COVID-19 Pandemic (e.g. social distancing).  I verified, using two identifiers, that I am speaking with Corey Mathis or their authorized healthcare agent. I discussed the limitations, risks, security, and privacy concerns of performing an evaluation and management service by telephone and the potential availability of an in-person appointment in the future. The patient expressed understanding and agreed to proceed.  Location of Patient: Home Location of Provider (nurse):  In the office.  Subjective:    Corey Mathis is a 70 y.o. male patient of Metheney, Rene Kocher, MD who had a Medicare Annual Wellness Visit today via telephone. Corey Mathis is Retired and lives alone. he has 2 children. he reports that he is socially active and does interact with friends/family regularly. he is moderately physically active and enjoys reading.  Patient Care Team: [name], MD as PCP - General (Family Medicine) [name] Denice Bors, MD as PCP - Cardiology (Cardiology) [name], MD (Family Medicine)  Advanced Directives 10/29/2020 02/11/2018 11/01/2017 10/17/2017 08/29/2017 07/12/2017 05/26/2014  Does Patient Have a Medical Advance Directive? Yes No Yes No No No No  Type of Advance Directive Living will;Healthcare Power of [place];Living will - - - -  Does patient want to make changes to medical advance directive? No - Patient declined - - - - - -  Copy of [place] in Chart? No - copy requested - No - copy requested - - - -  Would patient like information on creating a medical advance directive? No - Patient declined No - Patient declined - No - Patient declined No - Patient declined - Yes - Educational materials given  Pre-existing out of facility DNR order  (yellow form or pink MOST form) - - - - - - -    Hospital Utilization Over the Past 12 Months: # of hospitalizations or ER visits: 0 # of surgeries: 0  Review of Systems    Patient reports that his overall health is better compared to last year.  History obtained from chart review and the patient  Patient Reported Readings (BP, Pulse, CBG, Weight, etc) none  Pain Assessment Pain : No/denies pain     Current Medications & Allergies (verified) Allergies as of 10/29/2020       Reactions   Lisinopril Anaphylaxis, Other (See Comments)   angioedema   Carvedilol Other (See Comments)   wheezing   Ezetimibe-simvastatin Other (See Comments)   Serious vertigo   Propranolol Hcl Other (See Comments)   wheezing   Ramipril Other (See Comments)   wheezing   Codeine Nausea Only        Medication List        Accurate as of October 29, 2020  3:03 PM. If you have any questions, ask your nurse or doctor.          albuterol 108 (90 Base) MCG/ACT inhaler Commonly known as: ProAir HFA Inhale 2 puffs into the lungs every 6 (six) hours as needed for wheezing.   antiseptic oral rinse Liqd 5 mLs by Mouth Rinse route 2 (two) times daily.   aspirin 81 MG EC tablet Take 81 mg by mouth at bedtime.   atorvastatin 40 MG tablet Commonly known as: LIPITOR Take 1 tablet (40 mg total) by mouth daily.   cholecalciferol 1000 units tablet Commonly known as: VITAMIN  D Take 1,000 Units by mouth at bedtime.   COENZYME Q10 PO Take 1 capsule by mouth at bedtime.   fluticasone 50 MCG/ACT nasal spray Commonly known as: FLONASE Place 1 spray into both nostrils daily as needed (congestion).   fluticasone-salmeterol 250-50 MCG/ACT Aepb Commonly known as: ADVAIR INHALE ONE PUFF BY MOUTH TWICE A DAY   hydrALAZINE 50 MG tablet Commonly known as: APRESOLINE Take 100 mg by mouth 3 (three) times daily.   isosorbide mononitrate 60 MG 24 hr tablet Commonly known as: IMDUR Take 1 tablet (60 mg  total) by mouth daily.   loratadine 10 MG tablet Commonly known as: CLARITIN Take 1 tablet (10 mg total) by mouth daily.   Melatonin 10 MG Tabs Take 1 tablet by mouth as needed.   metoprolol succinate 100 MG 24 hr tablet Commonly known as: TOPROL-XL Take 1 tablet by mouth once daily   metoprolol succinate 50 MG 24 hr tablet Commonly known as: TOPROL-XL TAKE 1 TABLET BY MOUTH ONCE DAILY AT BEDTIME WITH A 100 MG TABLET FOR A TOTAL OF '150MG'$    multivitamin with minerals Tabs tablet Take 1 tablet by mouth at bedtime.   PARoxetine 20 MG tablet Commonly known as: PAXIL Take 1 tablet by mouth once daily   PROBIOTIC PO Take 1 tablet by mouth daily.   vitamin C 500 MG tablet Commonly known as: ASCORBIC ACID Take 500 mg by mouth daily.   warfarin 10 MG tablet Commonly known as: COUMADIN Take as directed by the anticoagulation clinic. If you are unsure how to take this medication, talk to your nurse or doctor. Original instructions: TAKE 1 TO 2 TABLETS BY MOUTH ONCE DAILY AS DIRECTED BY COUMADIN CLINIC        History (reviewed): Past Medical History:  Diagnosis Date   Allergy    Anxiety    Asthma    CHF (congestive heart failure) (HCC)    Dyslipidemia    History of mitral valve replacement    Hypertension    Labyrinthitis    hx   Past Surgical History:  Procedure Laterality Date   ACNE CYST REMOVAL     from hand and back   EXTERNAL FIXATION LEG Right 08/11/2017   Procedure: EXTERNAL FIXATION, RIGHT KNEE;  Surgeon: Leandrew Koyanagi, MD;  Location: [place];  Service: Orthopedics;  Laterality: Right;   FINE NEEDLE ASPIRATION Right 08/11/2017   Procedure: FINE NEEDLE ASPIRATION  of right Knee with 80 cc of dark red fluid removed;  Surgeon: Leandrew Koyanagi, MD;  Location: [place];  Service: Orthopedics;  Laterality: Right;   FLEXIBLE SIGMOIDOSCOPY N/A 02/11/2018   Procedure: FLEXIBLE SIGMOIDOSCOPY;  Surgeon: Doran Stabler, MD;  Location: [place];  Service: Gastroenterology;   Laterality: N/A;   Jaw surgery (other)     NASAL SINUS SURGERY N/A 10/16/2012   Procedure: NASAL ENDOSCOPIC POLYPECTOMY/MAXILLARY ANTROSTOMY/ETHMOIDECTOMY;  Surgeon: Izora Gala, MD;  Location: [facility] OR;  Service: ENT;  Laterality: N/A;   ORIF TIBIA PLATEAU Left 08/13/2017   Procedure: OPEN REDUCTION INTERNAL FIXATION (ORIF) TIBIAL PLATEAU;  Surgeon: Leandrew Koyanagi, MD;  Location: [place];  Service: Orthopedics;  Laterality: Left;   ORIF TIBIA PLATEAU Right 08/22/2017   Procedure: OPEN REDUCTION INTERNAL FIXATION (ORIF) RIGHT BICONDYLAR TIBIAL PLATEAU, REMOVAL OF EX FIX;  Surgeon: Leandrew Koyanagi, MD;  Location: [place];  Service: Orthopedics;  Laterality: Right;   RIGHT/LEFT HEART CATH AND CORONARY ANGIOGRAPHY N/A 07/12/2017   Procedure: RIGHT/LEFT HEART CATH AND CORONARY ANGIOGRAPHY;  Surgeon: Burnell Blanks, MD;  Location: [place] [facility] LAB;  Service: Cardiovascular;  Laterality: N/A;   SUBMUCOSAL INJECTION  02/11/2018   Procedure: SUBMUCOSAL INJECTION;  Surgeon: Doran Stabler, MD;  Location: [facility];  Service: Gastroenterology;;   TOOTH EXTRACTION     VALVE REPLACEMENT  1998   St. Jude, mitral   Family History  Problem Relation Age of Onset   Heart attack Father 66   Colon cancer Mother 34   Hypertension Mother    Breast cancer Maternal Aunt    Stomach cancer Neg Hx    Esophageal cancer Neg Hx    Inflammatory bowel disease Neg Hx    Liver disease Neg Hx    Pancreatic cancer Neg Hx    Social History   Socioeconomic History   Marital status: Married    Spouse name: Frenchie    Number of children: 2   Years of education: 20   Highest education level: Professional school degree (e.g., MD, DDS, DVM, JD)  Occupational History   Occupation: retired    Fish farm manager: [employer]  Tobacco Use   Smoking status: Never   Smokeless tobacco: Never  Vaping Use   Vaping Use: Never used  Substance and Sexual Activity   Alcohol use: Never   Drug use: Never   Sexual activity: Not  Currently  Other Topics Concern   Not on file  Social History Narrative   Lives alone. He has two children. He enjoys reading and house stuff.   Social Determinants of Health   Financial Resource Strain: Low Risk    Difficulty of Paying Living Expenses: Not hard at all  Food Insecurity: No Food Insecurity   Worried About Charity fundraiser in the Last Year: Never true   [place] in the Last Year: Never true  Transportation Needs: No Transportation Needs   Lack of Transportation (Medical): No   Lack of Transportation (Non-Medical): No  Physical Activity: Sufficiently Active   Days of Exercise per Week: 7 days   Minutes of Exercise per Session: 30 min  Stress: No Stress Concern Present   Feeling of Stress : Not at all  Social Connections: Moderately Isolated   Frequency of Communication with Friends and Family: More than three times a week   Frequency of Social Gatherings with Friends and Family: Once a week   Attends Religious Services: Never   Marine scientist or Organizations: No   Attends Archivist Meetings: Never   Marital Status: Married    Activities of Daily Living In your present state of health, do you have any difficulty performing the following activities: 10/29/2020  Hearing? N  Vision? N  Difficulty concentrating or making decisions? N  Walking or climbing stairs? N  Dressing or bathing? N  Doing errands, shopping? N  Preparing Food and eating ? N  Using the Toilet? N  In the past six months, have you accidently leaked urine? N  Do you have problems with loss of bowel control? N  Managing your Medications? N  Managing your Finances? N  Housekeeping or managing your Housekeeping? N  Some recent data might be hidden    Patient Education/ Literacy How often do you need to have someone help you when you read instructions, pamphlets, or other written materials from your doctor or pharmacy?: 1 - Never What is the last grade level you  completed in school?: PHD  Exercise Current Exercise Habits: Home exercise routine, Type of  exercise: walking, Time (Minutes): 30, Frequency (Times/Week): 7, Weekly Exercise (Minutes/Week): 210, Intensity: Moderate, Exercise limited by: None identified  Diet Patient reports consuming 3 meals a day and 1 snack(s) a day Patient reports that his primary diet is: Regular Patient reports that she does have regular access to food.   Depression Screen PHQ 2/9 Scores 10/29/2020 05/29/2019 01/29/2018 10/17/2017 06/15/2017 09/05/2016 05/26/2014  PHQ - 2 Score 0 0 0 0 2 0 1  PHQ- 9 Score - 0 1 - 9 - -     Fall Risk Fall Risk  10/29/2020 05/29/2019 01/29/2019 06/15/2017 09/05/2016  Falls in the past year? 0 0 0 No No  Comment - - Emmi Telephone Survey: data to providers prior to load - -  Number falls in past yr: 0 0 - - -  Injury with Fall? 0 0 - - -  Risk for fall due to : No Fall Risks No Fall Risks - - -  Follow up Falls evaluation completed - - - -     Objective:  Corey Mathis seemed alert and oriented and he participated appropriately during our telephone visit.  Blood Pressure Weight BMI  BP Readings from Last 3 Encounters:  08/23/20 (!) 148/88  07/19/20 (!) 144/88  05/18/20 140/88   Wt Readings from Last 3 Encounters:  08/23/20 207 lb (93.9 kg)  05/18/20 190 lb (86.2 kg)  10/10/19 196 lb 9.6 oz (89.2 kg)   BMI Readings from Last 1 Encounters:  08/23/20 25.20 kg/m    *Unable to obtain current vital signs, weight, and BMI due to telephone visit type  Hearing/Vision  Corey Mathis did not seem to have difficulty with hearing/understanding during the telephone conversation Reports that he has not had a formal eye exam by an eye care professional within the past year Reports that he has not had a formal hearing evaluation within the past year *Unable to fully assess hearing and vision during telephone visit type  Cognitive Function: 6CIT Screen 10/29/2020 11/01/2017 09/05/2016  What Year? 0  points 0 points 0 points  What month? 0 points 0 points 0 points  What time? 0 points 0 points 0 points  Count back from 20 0 points 0 points 0 points  Months in reverse 0 points 0 points 0 points  Repeat phrase 0 points 0 points 2 points  Total Score 0 0 2   (Normal:0-7, Significant for Dysfunction: >8)  Normal Cognitive Function Screening: Yes   Immunization & Health Maintenance Record Immunization History  Administered Date(s) Administered   Fluad Quad(high Dose 65+) 10/31/2018   Influenza Split 02/10/2011   Influenza, High Dose Seasonal PF 11/04/2016, 11/26/2017   Influenza,inj,Quad PF,6+ Mos 01/13/2013, 01/26/2014, 01/12/2015   Influenza-Unspecified 12/26/2019   PFIZER(Purple Top)SARS-COV-2 Vaccination 04/07/2019, 04/29/2019, 12/26/2019, 08/16/2020   Pneumococcal Conjugate-13 09/23/2013   Pneumococcal Polysaccharide-23 03/13/2011, 05/27/2018   Pneumococcal-Unspecified 05/10/2016   Tdap 03/07/1999, 08/10/2015   Zoster Recombinat (Shingrix) 01/25/2019    Health Maintenance  Topic Date Due   Zoster Vaccines- Shingrix (2 of 2) 01/29/2021 (Originally 03/22/2019)   INFLUENZA VACCINE  06/03/2021 (Originally 10/04/2020)   COLONOSCOPY (Pts 45-54yr Insurance coverage will need to be confirmed)  02/07/2021   TETANUS/TDAP  08/09/2025   COVID-19 Vaccine  Completed   Hepatitis C Screening  Completed   PNA vac Low Risk Adult  Completed   HPV VACCINES  Aged Out       Assessment  This is a routine wellness examination for Corey Mathis  Health Maintenance: Due or  Overdue There are no preventive care reminders to display for this patient.   Corey Mathis does not need a referral for Community Assistance: Care Management:   no Social Work:    no Prescription Assistance:  no Nutrition/Diabetes Education:  no   Plan:  Personalized Goals  Goals Addressed               This Visit's Progress     Patient Stated (pt-stated)        He would like get all of his dental  work done.       Personalized Health Maintenance & Screening Recommendations  Influenza vaccine Shingles vaccine 2nd dose Colonoscopy will be due in December, 2022  Lung Cancer Screening Recommended: no (Low Dose CT Chest recommended if Age 31-80 years, 30 pack-year currently smoking OR have quit w/in past 15 years) Hepatitis C Screening recommended: no HIV Screening recommended: no  Advanced Directives: Written information was not prepared per patient's request.  Referrals & Orders No orders of the defined types were placed in this encounter.   Follow-up Plan Follow-up with [name], MD as planned Schedule a nurse visit for your flu shot.  Schedule an appointment for your second dose shingles shot at your pharmacy.  Medicare wellness visit in one year.  AVS printed and mailed to the patient.   I have personally reviewed and noted the following in the patient's chart:   Medical and social history Use of alcohol, tobacco or illicit drugs  Current medications and supplements Functional ability and status Nutritional status Physical activity Advanced directives List of other physicians Hospitalizations, surgeries, and ER visits in previous 12 months Vitals Screenings to include cognitive, depression, and falls Referrals and appointments  In addition, I have reviewed and discussed with Corey Mathis certain preventive protocols, quality metrics, and best practice recommendations. A written personalized care plan for preventive services as well as general preventive health recommendations is available and can be mailed to the patient at his request.      Tinnie Gens, RN  10/29/2020

## 2020-11-03 ENCOUNTER — Other Ambulatory Visit: Payer: Self-pay

## 2020-11-03 ENCOUNTER — Ambulatory Visit (INDEPENDENT_AMBULATORY_CARE_PROVIDER_SITE_OTHER): Payer: Medicare Other

## 2020-11-03 ENCOUNTER — Other Ambulatory Visit: Payer: Self-pay | Admitting: Cardiology

## 2020-11-03 DIAGNOSIS — Z9889 Other specified postprocedural states: Secondary | ICD-10-CM | POA: Diagnosis not present

## 2020-11-03 DIAGNOSIS — Z7901 Long term (current) use of anticoagulants: Secondary | ICD-10-CM

## 2020-11-03 DIAGNOSIS — Z5181 Encounter for therapeutic drug level monitoring: Secondary | ICD-10-CM

## 2020-11-03 LAB — POCT INR: INR: 3.5 — AB (ref 2.0–3.0)

## 2020-11-03 NOTE — Patient Instructions (Signed)
Continue taking 1 tablet daily. Repeat INR in 6 weeks.

## 2020-11-19 ENCOUNTER — Other Ambulatory Visit: Payer: Self-pay | Admitting: Family Medicine

## 2020-11-20 NOTE — Progress Notes (Signed)
HPI: FU mitral valve replacement with a St. Jude valve in 1998 and cardiomyopathy. Abdominal ultrasound November 2015 showed no aneurysm. Echo December 2018 showed ejection fraction 35-40% with diffuse hypokinesis.  The ascending aorta measured 43 mm. There was a mechanical valve with mean gradient 3 mmHg. Mild biatrial enlargement. Cardiac catheterization May 2019 showed moderate nonobstructive coronary disease and ejection fraction greater than 65%.  Most recent echocardiogram June 2022 showed ejection fraction 45 to 50%, mild left ventricular hypertrophy, moderate left atrial enlargement, previous mitral valve replacement with mean gradient 3 mmHg and mildly dilated aortic root at 40 mm.  CTA August 2022 showed 4.1 cm ascending aortic aneurysm.  Since I last saw him, the patient has dyspnea with more extreme activities but not with routine activities. It is relieved with rest. It is not associated with chest pain. There is no orthopnea, PND or pedal edema. There is no syncope or palpitations. There is no exertional chest pain.   Current Outpatient Medications  Medication Sig Dispense Refill   albuterol (PROAIR HFA) 108 (90 Base) MCG/ACT inhaler Inhale 2 puffs into the lungs every 6 (six) hours as needed for wheezing. 3 Inhaler 2   antiseptic oral rinse (BIOTENE) LIQD 5 mLs by Mouth Rinse route 2 (two) times daily.     aspirin 81 MG EC tablet Take 81 mg by mouth at bedtime.     cholecalciferol (VITAMIN D) 1000 units tablet Take 1,000 Units by mouth at bedtime.     COENZYME Q10 PO Take 1 capsule by mouth at bedtime.     fluticasone (FLONASE) 50 MCG/ACT nasal spray Place 1 spray into both nostrils daily as needed (congestion). 16 g 0   fluticasone-salmeterol (ADVAIR) 250-50 MCG/ACT AEPB INHALE ONE PUFF BY MOUTH TWICE A DAY 60 each 11   hydrALAZINE (APRESOLINE) 50 MG tablet TAKE 1 TABLET BY MOUTH THREE TIMES DAILY 270 tablet 3   isosorbide mononitrate (IMDUR) 60 MG 24 hr tablet Take 1 tablet  (60 mg total) by mouth daily. 90 tablet 3   loratadine (CLARITIN) 10 MG tablet Take 1 tablet (10 mg total) by mouth daily. 30 tablet 0   Melatonin 10 MG TABS Take 1 tablet by mouth as needed.     metoprolol succinate (TOPROL-XL) 100 MG 24 hr tablet Take 1 tablet by mouth once daily 90 tablet 0   metoprolol succinate (TOPROL-XL) 50 MG 24 hr tablet TAKE 1 TABLET BY MOUTH ONCE DAILY AT BEDTIME WITH A 100 MG TABLET FOR A TOTAL OF '150MG'$  90 tablet 1   Multiple Vitamin (MULTIVITAMIN WITH MINERALS) TABS tablet Take 1 tablet by mouth at bedtime.     PARoxetine (PAXIL) 20 MG tablet Take 1 tablet by mouth once daily 90 tablet 0   Probiotic Product (PROBIOTIC PO) Take 1 tablet by mouth daily.     vitamin C (ASCORBIC ACID) 500 MG tablet Take 500 mg by mouth daily.     warfarin (COUMADIN) 10 MG tablet TAKE 1 TO 2 TABLETS BY MOUTH ONCE DAILY AS DIRECTED BY COUMADIN CLINIC 135 tablet 0   atorvastatin (LIPITOR) 40 MG tablet Take 1 tablet (40 mg total) by mouth daily. 90 tablet 3   No current facility-administered medications for this visit.     Past Medical History:  Diagnosis Date   Allergy    Anxiety    Asthma    CHF (congestive heart failure) (HCC)    Dyslipidemia    History of mitral valve replacement    Hypertension  Labyrinthitis    hx    Past Surgical History:  Procedure Laterality Date   ACNE CYST REMOVAL     from hand and back   EXTERNAL FIXATION LEG Right 08/11/2017   Procedure: EXTERNAL FIXATION, RIGHT KNEE;  Surgeon: Leandrew Koyanagi, MD;  Location: [place];  Service: Orthopedics;  Laterality: Right;   FINE NEEDLE ASPIRATION Right 08/11/2017   Procedure: FINE NEEDLE ASPIRATION  of right Knee with 80 cc of dark red fluid removed;  Surgeon: Leandrew Koyanagi, MD;  Location: [place];  Service: Orthopedics;  Laterality: Right;   FLEXIBLE SIGMOIDOSCOPY N/A 02/11/2018   Procedure: FLEXIBLE SIGMOIDOSCOPY;  Surgeon: Doran Stabler, MD;  Location: [place];  Service: Gastroenterology;  Laterality:  N/A;   Jaw surgery (other)     NASAL SINUS SURGERY N/A 10/16/2012   Procedure: NASAL ENDOSCOPIC POLYPECTOMY/MAXILLARY ANTROSTOMY/ETHMOIDECTOMY;  Surgeon: Izora Gala, MD;  Location: [facility] OR;  Service: ENT;  Laterality: N/A;   ORIF TIBIA PLATEAU Left 08/13/2017   Procedure: OPEN REDUCTION INTERNAL FIXATION (ORIF) TIBIAL PLATEAU;  Surgeon: Leandrew Koyanagi, MD;  Location: [place];  Service: Orthopedics;  Laterality: Left;   ORIF TIBIA PLATEAU Right 08/22/2017   Procedure: OPEN REDUCTION INTERNAL FIXATION (ORIF) RIGHT BICONDYLAR TIBIAL PLATEAU, REMOVAL OF EX FIX;  Surgeon: Leandrew Koyanagi, MD;  Location: [place];  Service: Orthopedics;  Laterality: Right;   RIGHT/LEFT HEART CATH AND CORONARY ANGIOGRAPHY N/A 07/12/2017   Procedure: RIGHT/LEFT HEART CATH AND CORONARY ANGIOGRAPHY;  Surgeon: Burnell Blanks, MD;  Location: [place] [facility] LAB;  Service: Cardiovascular;  Laterality: N/A;   SUBMUCOSAL INJECTION  02/11/2018   Procedure: SUBMUCOSAL INJECTION;  Surgeon: Doran Stabler, MD;  Location: [facility] ENDOSCOPY;  Service: Gastroenterology;;   TOOTH EXTRACTION     VALVE REPLACEMENT  1998   St. Jude, mitral    Social History   Socioeconomic History   Marital status: Married    Spouse name: Frenchie    Number of children: 2   Years of education: 20   Highest education level: Professional school degree (e.g., MD, DDS, DVM, JD)  Occupational History   Occupation: retired    Fish farm manager: [employer]  Tobacco Use   Smoking status: Never   Smokeless tobacco: Never  Vaping Use   Vaping Use: Never used  Substance and Sexual Activity   Alcohol use: Never   Drug use: Never   Sexual activity: Not Currently  Other Topics Concern   Not on file  Social History Narrative   Lives alone. He has two children. He enjoys reading and house stuff.   Social Determinants of Health   Financial Resource Strain: Low Risk    Difficulty of Paying Living Expenses: Not hard at all  Food Insecurity: No Food Insecurity    Worried About Charity fundraiser in the Last Year: Never true   [place] in the Last Year: Never true  Transportation Needs: No Transportation Needs   Lack of Transportation (Medical): No   Lack of Transportation (Non-Medical): No  Physical Activity: Sufficiently Active   Days of Exercise per Week: 7 days   Minutes of Exercise per Session: 30 min  Stress: No Stress Concern Present   Feeling of Stress : Not at all  Social Connections: Moderately Isolated   Frequency of Communication with Friends and Family: More than three times a week   Frequency of Social Gatherings with Friends and Family: Once a week   Attends Religious Services: Never  Active Member of Clubs or Organizations: No   Attends Archivist Meetings: Never   Marital Status: Married  Human resources officer Violence: Not At Risk   Fear of Current or Ex-Partner: No   Emotionally Abused: No   Physically Abused: No   Sexually Abused: No    Family History  Problem Relation Age of Onset   Heart attack Father 31   Colon cancer Mother 64   Hypertension Mother    Breast cancer Maternal Aunt    Stomach cancer Neg Hx    Esophageal cancer Neg Hx    Inflammatory bowel disease Neg Hx    Liver disease Neg Hx    Pancreatic cancer Neg Hx     ROS: no fevers or chills, productive cough, hemoptysis, dysphasia, odynophagia, melena, hematochezia, dysuria, hematuria, rash, seizure activity, orthopnea, PND, pedal edema, claudication. Remaining systems are negative.  Physical Exam: Well-developed well-nourished in no acute distress.  Skin is warm and dry.  HEENT is normal.  Neck is supple.  Chest is clear to auscultation with normal expansion.  Cardiovascular exam is regular rate and rhythm.  Crisp mechanical valve sound Abdominal exam nontender or distended. No masses palpated. Extremities show no edema. neuro grossly intact  ECG-sinus rhythm with first-degree AV block, nonspecific ST changes.  Personally  reviewed  A/P  1 nonischemic cardiomyopathy-we will continue hydralazine/nitrates (patient had angioedema with ACE inhibitor's in the past); continue Toprol.  LV function mildly reduced on most recent study.  2 previous mitral valve replacement-continue Coumadin and aspirin.  Continue SBE prophylaxis.  Patient is scheduled to have multiple teeth extractions.  He will need to be off Coumadin by his report.  We will arrange Lovenox bridge for his procedure.  3 dilated aortic root-follow-up CTA August 2023.  4 coronary artery disease-continue statin.  5 hyperlipidemia-continue statin.  6 hypertension-blood pressure dated; add HCTZ 25 mg daily.  Check potassium and renal function in 1 week.  Kirk Ruths, MD

## 2020-11-23 ENCOUNTER — Ambulatory Visit (INDEPENDENT_AMBULATORY_CARE_PROVIDER_SITE_OTHER): Payer: Medicare Other | Admitting: Cardiology

## 2020-11-23 ENCOUNTER — Telehealth: Payer: Self-pay | Admitting: Cardiology

## 2020-11-23 ENCOUNTER — Encounter: Payer: Self-pay | Admitting: Cardiology

## 2020-11-23 ENCOUNTER — Other Ambulatory Visit: Payer: Self-pay

## 2020-11-23 VITALS — BP 140/81 | HR 80 | Ht 76.0 in | Wt 195.2 lb

## 2020-11-23 DIAGNOSIS — Z952 Presence of prosthetic heart valve: Secondary | ICD-10-CM

## 2020-11-23 DIAGNOSIS — I10 Essential (primary) hypertension: Secondary | ICD-10-CM

## 2020-11-23 DIAGNOSIS — I428 Other cardiomyopathies: Secondary | ICD-10-CM | POA: Diagnosis not present

## 2020-11-23 DIAGNOSIS — I712 Thoracic aortic aneurysm, without rupture, unspecified: Secondary | ICD-10-CM

## 2020-11-23 MED ORDER — HYDROCHLOROTHIAZIDE 25 MG PO TABS: 25.0000 mg | ORAL_TABLET | Freq: Every day | ORAL | 3 refills | Status: DC

## 2020-11-23 MED ORDER — HYDRALAZINE HCL 50 MG PO TABS: 75.0000 mg | ORAL_TABLET | Freq: Three times a day (TID) | ORAL | 3 refills | Status: DC

## 2020-11-23 NOTE — Patient Instructions (Signed)
Medication Instructions:   START HCTZ 25 MG ONCE DAILY  *If you need a refill on your cardiac medications before your next appointment, please call your pharmacy*   Lab Work:  Your physician recommends that you return for lab work in:[place]  If you have labs (blood work) drawn today and your tests are completely normal, you will receive your results only by: [place] (if you have MyChart) OR A paper copy in the mail If you have any lab test that is abnormal or we need to change your treatment, we will call you to review the results.   Follow-Up: At [facility], you and your health needs are our priority.  As part of our continuing mission to provide you with exceptional heart care, we have created designated Provider Care Teams.  These Care Teams include your primary Cardiologist (physician) and Advanced Practice Providers (APPs -  Physician Assistants and Nurse Practitioners) who all work together to provide you with the care you need, when you need it.  We recommend signing up for the patient portal called "MyChart".  Sign up information is provided on this After Visit Summary.  MyChart is used to connect with patients for Virtual Visits (Telemedicine).  Patients are able to view lab/test results, encounter notes, upcoming appointments, etc.  Non-urgent messages can be sent to your provider as well.   To learn more about what you can do with MyChart, go to [email or web address].    Your next appointment:   12 month(s)  The format for your next appointment:   In Person  Provider:   Kirk Ruths, MD

## 2020-11-23 NOTE — Telephone Encounter (Signed)
Pt was advised to call the office when he arrives home and give the dosage of his medicine Hydralazine 50 mg

## 2020-11-23 NOTE — Telephone Encounter (Signed)
Spoke with pt, Aware of dr Jacalyn Lefevre recommendations. New script sent to the pharmacy and left message on pharmacy voicemail of the change.

## 2020-11-23 NOTE — Telephone Encounter (Signed)
Pt state he was instructed to call to confirm his hydralazine dose. He report his med list is correct and is taking hydralazine 50 mg TID.  Will forward to MD and nurse to make aware.

## 2020-11-24 DIAGNOSIS — Z23 Encounter for immunization: Secondary | ICD-10-CM | POA: Diagnosis not present

## 2020-11-24 DIAGNOSIS — U071 COVID-19: Secondary | ICD-10-CM | POA: Diagnosis not present

## 2020-11-26 ENCOUNTER — Other Ambulatory Visit: Payer: Self-pay | Admitting: Cardiology

## 2020-12-09 ENCOUNTER — Other Ambulatory Visit: Payer: Self-pay | Admitting: Cardiology

## 2020-12-15 ENCOUNTER — Other Ambulatory Visit: Payer: Self-pay

## 2020-12-15 ENCOUNTER — Ambulatory Visit (INDEPENDENT_AMBULATORY_CARE_PROVIDER_SITE_OTHER): Payer: Medicare Other

## 2020-12-15 DIAGNOSIS — Z7901 Long term (current) use of anticoagulants: Secondary | ICD-10-CM | POA: Diagnosis not present

## 2020-12-15 DIAGNOSIS — Z5181 Encounter for therapeutic drug level monitoring: Secondary | ICD-10-CM

## 2020-12-15 DIAGNOSIS — Z9889 Other specified postprocedural states: Secondary | ICD-10-CM

## 2020-12-15 LAB — POCT INR: INR: 3.5 — AB (ref 2.0–3.0)

## 2020-12-15 NOTE — Patient Instructions (Signed)
Continue taking 1 tablet daily. Repeat INR in 6 weeks.

## 2021-01-26 ENCOUNTER — Other Ambulatory Visit: Payer: Self-pay

## 2021-01-26 ENCOUNTER — Ambulatory Visit (INDEPENDENT_AMBULATORY_CARE_PROVIDER_SITE_OTHER): Payer: Medicare Other

## 2021-01-26 DIAGNOSIS — Z9889 Other specified postprocedural states: Secondary | ICD-10-CM

## 2021-01-26 DIAGNOSIS — Z7901 Long term (current) use of anticoagulants: Secondary | ICD-10-CM | POA: Diagnosis not present

## 2021-01-26 DIAGNOSIS — Z5181 Encounter for therapeutic drug level monitoring: Secondary | ICD-10-CM

## 2021-01-26 LAB — POCT INR: INR: 4.5 — AB (ref 2.0–3.0)

## 2021-01-26 NOTE — Patient Instructions (Signed)
HOLD tonight only and then Continue taking 1 tablet daily. Repeat INR in 3 weeks.

## 2021-02-14 ENCOUNTER — Other Ambulatory Visit: Payer: Self-pay

## 2021-02-14 ENCOUNTER — Telehealth: Payer: Self-pay

## 2021-02-14 ENCOUNTER — Other Ambulatory Visit: Payer: Self-pay | Admitting: Cardiology

## 2021-02-14 DIAGNOSIS — Z86010 Personal history of colonic polyps: Secondary | ICD-10-CM

## 2021-02-14 DIAGNOSIS — D126 Benign neoplasm of colon, unspecified: Secondary | ICD-10-CM

## 2021-02-14 NOTE — Telephone Encounter (Signed)
Clinical pharmacist to review Coumadin.  Patient has a mechanical mitral valve replacement history and to require Lovenox bridging.

## 2021-02-14 NOTE — Telephone Encounter (Signed)
Request for surgical clearance:     Endoscopy Procedure  What type of surgery is being performed?     Colonoscopy   When is this surgery scheduled?     04/21/2021  What type of clearance is required ?   Pharmacy  Are there any medications that need to be held prior to surgery and how long? Coumadin x5 days prior to procedure. Will Lovenox Bridging be needed?    Practice name and name of physician performing surgery?      [facility]-Dr Mansouraty   What is your office phone and fax number?      Phone- [phone]  [phone]  Anesthesia type (None, local, MAC, general) ?       MAC

## 2021-02-14 NOTE — Progress Notes (Signed)
Pt scheduled for Colon @ [facility] 04/21/21 10:15am procedure time.

## 2021-02-14 NOTE — Telephone Encounter (Signed)
Patient has been scheduled for office visit on 03/25/21 at 2:10pm to discuss colonoscopy that has been scheduled for 04/21/21 at [facility]. I will mail patient appointment reminders and he is aware that we will discuss prep instructions when he comes in to see [place] at his office visit. Clearance will be sent to cardiology asking for clearance to hold Coumadin 5 days prior to his procedure. Pt voiced understanding.

## 2021-02-15 NOTE — Telephone Encounter (Signed)
See recommendation by clinical pharmacist.  Patient has upcoming visit with Coumadin clinic to arrange Lovenox bridging given the presence of mechanical mitral valve replacement.  He has a history of nonischemic cardiomyopathy, previous cardiac catheterization in May 2019 showed moderate nonobstructive disease.  He was last seen by Dr. [name] in September 2022 at which time he was doing well.

## 2021-02-15 NOTE — Telephone Encounter (Signed)
Patient with diagnosis of mechanical MVR on warfarin for anticoagulation.    Procedure: colonoscopy Date of procedure: 04/21/21  CrCl 62mL/min Platelet count 161K  Per office protocol, patient can hold warfarin for 5 days prior to procedure. Patient will need bridging with Lovenox 1mg /kg BID around procedure. This will be coordinated at [facility] Coumadin clinic where pt is followed.

## 2021-02-16 ENCOUNTER — Other Ambulatory Visit: Payer: Self-pay

## 2021-02-16 ENCOUNTER — Ambulatory Visit (INDEPENDENT_AMBULATORY_CARE_PROVIDER_SITE_OTHER): Payer: Medicare Other

## 2021-02-16 DIAGNOSIS — Z5181 Encounter for therapeutic drug level monitoring: Secondary | ICD-10-CM | POA: Diagnosis not present

## 2021-02-16 DIAGNOSIS — Z7901 Long term (current) use of anticoagulants: Secondary | ICD-10-CM

## 2021-02-16 DIAGNOSIS — Z9889 Other specified postprocedural states: Secondary | ICD-10-CM

## 2021-02-16 LAB — POCT INR: INR: 4.4 — AB (ref 2.0–3.0)

## 2021-02-16 NOTE — Patient Instructions (Signed)
HOLD tonight only and then decrease to 1 tablet daily, except 0.5 tablet on Wednesday. Repeat INR in 3 weeks.

## 2021-02-21 ENCOUNTER — Encounter: Payer: Self-pay | Admitting: Family Medicine

## 2021-02-21 ENCOUNTER — Ambulatory Visit (INDEPENDENT_AMBULATORY_CARE_PROVIDER_SITE_OTHER): Payer: Medicare Other | Admitting: Family Medicine

## 2021-02-21 ENCOUNTER — Other Ambulatory Visit: Payer: Self-pay

## 2021-02-21 VITALS — BP 159/88 | HR 70 | Temp 98.2°F | Resp 16 | Ht 76.0 in | Wt 196.0 lb

## 2021-02-21 DIAGNOSIS — Z952 Presence of prosthetic heart valve: Secondary | ICD-10-CM

## 2021-02-21 DIAGNOSIS — J4530 Mild persistent asthma, uncomplicated: Secondary | ICD-10-CM

## 2021-02-21 DIAGNOSIS — Z7901 Long term (current) use of anticoagulants: Secondary | ICD-10-CM | POA: Diagnosis not present

## 2021-02-21 DIAGNOSIS — N401 Enlarged prostate with lower urinary tract symptoms: Secondary | ICD-10-CM

## 2021-02-21 DIAGNOSIS — Z1211 Encounter for screening for malignant neoplasm of colon: Secondary | ICD-10-CM | POA: Diagnosis not present

## 2021-02-21 DIAGNOSIS — R7301 Impaired fasting glucose: Secondary | ICD-10-CM

## 2021-02-21 DIAGNOSIS — I428 Other cardiomyopathies: Secondary | ICD-10-CM | POA: Diagnosis not present

## 2021-02-21 DIAGNOSIS — R748 Abnormal levels of other serum enzymes: Secondary | ICD-10-CM

## 2021-02-21 DIAGNOSIS — F411 Generalized anxiety disorder: Secondary | ICD-10-CM | POA: Diagnosis not present

## 2021-02-21 DIAGNOSIS — I10 Essential (primary) hypertension: Secondary | ICD-10-CM

## 2021-02-21 MED ORDER — PAROXETINE HCL 30 MG PO TABS: 30.0000 mg | ORAL_TABLET | Freq: Every day | ORAL | 1 refills | Status: DC

## 2021-02-21 NOTE — Progress Notes (Signed)
Patient has appt. For Colonoscopy at [facility] in 04/2020

## 2021-02-21 NOTE — Assessment & Plan Note (Signed)
Is felt a little bit more anxious around the holidays that he has an upcoming colonoscopy is a little nervous about he would like to try going up on his Paxil.  New prescription sent to pharmacy.

## 2021-02-21 NOTE — Telephone Encounter (Signed)
FYI- Dr Rush Landmark   Per office protocol, patient can hold warfarin for 5 days prior to procedure. Patient will need bridging with Lovenox 1mg /kg BID around procedure. This will be coordinated at [facility] Coumadin clinic where pt is followed.

## 2021-02-21 NOTE — Assessment & Plan Note (Signed)
Coumadin being managed by cardiology.

## 2021-02-21 NOTE — Assessment & Plan Note (Signed)
Recheck hemoglobin A1c today.  He has not been as active lately.

## 2021-02-21 NOTE — Assessment & Plan Note (Addendum)
Blood pressure is not well controlled today.  Repeat was still elevated.  Currently on Imdur and metoprolol.  He gets angioedema with lisinopril and wheezing with ramipril.  We will have him come back in 2 weeks for nurse visit recheck blood pressure.

## 2021-02-21 NOTE — Assessment & Plan Note (Signed)
Does have a little bit of wheeze in the left lower lung posteriorly.  He said he did using his inhaler this morning and has been getting a little chest congestion in the mornings.  Just encouraged him to call if he feels like he is not improving or if he suddenly gets worse.

## 2021-02-21 NOTE — Progress Notes (Signed)
Established Patient Office Visit  Subjective:  Patient ID: Corey Mathis, male    DOB: 1950-08-03  Age: 70 y.o. MRN: [id number]  CC:  Chief Complaint  Patient presents with   Hypertension    Follow up    Anxiety    Follow up. Patient would like to discuss increasing Paxil.     HPI Corey Mathis presents for   Hypertension- Pt denies chest pain, SOB, dizziness, or heart palpitations.  Taking meds as directed w/o problems.  Denies medication side effects.    Impaired fasting glucose-no increased thirst or urination. No symptoms consistent with hypoglycemia.  F/U Asthma -he is doing well overall.  He is a little postnasal drip at night on a get a little congestion in his upper chest and airway he says usually just drinks a couple cups of coffee and is able to clear it out and feels better he does usually use his inhaler in the morning.  Scheduled for his colonoscopy in February with Dr. Henrene Hawking yto follow-up on tubular adenoma of the colon.  He also had elevated Liver liver enzymes about a year ago on last repeat labs they have trended downward but not completely back to normal.  He is on Coumadin for mechanical valve-currently takes 10 mg daily except on Wednesdays takes a half a tab daily.  Past Medical History:  Diagnosis Date   Allergy    Anxiety    Asthma    CHF (congestive heart failure) (HCC)    Dyslipidemia    History of mitral valve replacement    Hypertension    Labyrinthitis    hx    Past Surgical History:  Procedure Laterality Date   ACNE CYST REMOVAL     from hand and back   EXTERNAL FIXATION LEG Right 08/11/2017   Procedure: EXTERNAL FIXATION, RIGHT KNEE;  Surgeon: Leandrew Koyanagi, MD;  Location: [address];  Service: Orthopedics;  Laterality: Right;   FINE NEEDLE ASPIRATION Right 08/11/2017   Procedure: FINE NEEDLE ASPIRATION  of right Knee with 80 cc of dark red fluid removed;  Surgeon: Leandrew Koyanagi, MD;  Location: [place];  Service: Orthopedics;   Laterality: Right;   FLEXIBLE SIGMOIDOSCOPY N/A 02/11/2018   Procedure: FLEXIBLE SIGMOIDOSCOPY;  Surgeon: Doran Stabler, MD;  Location: [place];  Service: Gastroenterology;  Laterality: N/A;   Jaw surgery (other)     NASAL SINUS SURGERY N/A 10/16/2012   Procedure: NASAL ENDOSCOPIC POLYPECTOMY/MAXILLARY ANTROSTOMY/ETHMOIDECTOMY;  Surgeon: Izora Gala, MD;  Location: [facility] OR;  Service: ENT;  Laterality: N/A;   ORIF TIBIA PLATEAU Left 08/13/2017   Procedure: OPEN REDUCTION INTERNAL FIXATION (ORIF) TIBIAL PLATEAU;  Surgeon: Leandrew Koyanagi, MD;  Location: [place];  Service: Orthopedics;  Laterality: Left;   ORIF TIBIA PLATEAU Right 08/22/2017   Procedure: OPEN REDUCTION INTERNAL FIXATION (ORIF) RIGHT BICONDYLAR TIBIAL PLATEAU, REMOVAL OF EX FIX;  Surgeon: Leandrew Koyanagi, MD;  Location: [place];  Service: Orthopedics;  Laterality: Right;   RIGHT/LEFT HEART CATH AND CORONARY ANGIOGRAPHY N/A 07/12/2017   Procedure: RIGHT/LEFT HEART CATH AND CORONARY ANGIOGRAPHY;  Surgeon: Burnell Blanks, MD;  Location: [place] [facility];  Service: Cardiovascular;  Laterality: N/A;   SUBMUCOSAL INJECTION  02/11/2018   Procedure: SUBMUCOSAL INJECTION;  Surgeon: Doran Stabler, MD;  Location: [place];  Service: Gastroenterology;;   TOOTH EXTRACTION     VALVE REPLACEMENT  1998   St. Jude, mitral    Family History  Problem Relation Age of  Onset   Heart attack Father 81   Colon cancer Mother 49   Hypertension Mother    Breast cancer Maternal Aunt    Stomach cancer Neg Hx    Esophageal cancer Neg Hx    Inflammatory bowel disease Neg Hx    Liver disease Neg Hx    Pancreatic cancer Neg Hx     Social History   Socioeconomic History   Marital status: Married    Spouse name: Frenchie    Number of children: 2   Years of education: 20   Highest education level: Professional school degree (e.g., MD, DDS, DVM, JD)  Occupational History   Occupation: retired    Fish farm manager: [employer]  Tobacco Use    Smoking status: Never   Smokeless tobacco: Never  Vaping Use   Vaping Use: Never used  Substance and Sexual Activity   Alcohol use: Never   Drug use: Never   Sexual activity: Not Currently  Other Topics Concern   Not on file  Social History Narrative   Lives alone. He has two children. He enjoys reading and house stuff.   Social Determinants of Health   Financial Resource Strain: Low Risk    Difficulty of Paying Living Expenses: Not hard at all  Food Insecurity: No Food Insecurity   Worried About Charity fundraiser in the Last Year: Never true   [place] in the Last Year: Never true  Transportation Needs: No Transportation Needs   Lack of Transportation (Medical): No   Lack of Transportation (Non-Medical): No  Physical Activity: Sufficiently Active   Days of Exercise per Week: 7 days   Minutes of Exercise per Session: 30 min  Stress: No Stress Concern Present   Feeling of Stress : Not at all  Social Connections: Moderately Isolated   Frequency of Communication with Friends and Family: More than three times a week   Frequency of Social Gatherings with Friends and Family: Once a week   Attends Religious Services: Never   Marine scientist or Organizations: No   Attends Music therapist: Never   Marital Status: Married  Human resources officer Violence: Not At Risk   Fear of Current or Ex-Partner: No   Emotionally Abused: No   Physically Abused: No   Sexually Abused: No    Outpatient Medications Prior to Visit  Medication Sig Dispense Refill   albuterol (PROAIR HFA) 108 (90 Base) MCG/ACT inhaler Inhale 2 puffs into the lungs every 6 (six) hours as needed for wheezing. 3 Inhaler 2   antiseptic oral rinse (BIOTENE) LIQD 5 mLs by Mouth Rinse route 2 (two) times daily.     aspirin 81 MG EC tablet Take 81 mg by mouth at bedtime.     cholecalciferol (VITAMIN D) 1000 units tablet Take 1,000 Units by mouth at bedtime.     COENZYME Q10 PO Take 1 capsule by mouth  at bedtime.     fluticasone (FLONASE) 50 MCG/ACT nasal spray Place 1 spray into both nostrils daily as needed (congestion). 16 g 0   fluticasone-salmeterol (ADVAIR) 250-50 MCG/ACT AEPB INHALE ONE PUFF BY MOUTH TWICE A DAY 60 each 11   hydrALAZINE (APRESOLINE) 50 MG tablet Take 1.5 tablets (75 mg total) by mouth 3 (three) times daily. 405 tablet 3   isosorbide mononitrate (IMDUR) 60 MG 24 hr tablet Take 1 tablet (60 mg total) by mouth daily. 90 tablet 3   Melatonin 10 MG TABS Take 1 tablet by mouth as needed.  metoprolol succinate (TOPROL-XL) 100 MG 24 hr tablet Take 1 tablet by mouth once daily 90 tablet 0   metoprolol succinate (TOPROL-XL) 50 MG 24 hr tablet TAKE 1 TABLET BY MOUTH ONCE DAILY AT BEDTIME WITH A 100 MG TABLET FOR A TOTAL OF 150MG  90 tablet 1   Multiple Vitamin (MULTIVITAMIN WITH MINERALS) TABS tablet Take 1 tablet by mouth at bedtime.     Probiotic Product (PROBIOTIC PO) Take 1 tablet by mouth daily.     vitamin C (ASCORBIC ACID) 500 MG tablet Take 500 mg by mouth daily.     warfarin (COUMADIN) 10 MG tablet TAKE 1 TO 2 TABLETS BY MOUTH ONCE DAILY AS DIRECTED BY COUMADIN CLINIC (Patient taking differently: TAKE 1 TO 2 TABLETS BY MOUTH ONCE DAILY AS DIRECTED BY COUMADIN CLINIC. Patient takes 10 mg daily except on Wednesdays 1/2 tablet) 135 tablet 0   PARoxetine (PAXIL) 20 MG tablet Take 1 tablet by mouth once daily 90 tablet 0   atorvastatin (LIPITOR) 40 MG tablet Take 1 tablet (40 mg total) by mouth daily. 90 tablet 3   loratadine (CLARITIN) 10 MG tablet Take 1 tablet (10 mg total) by mouth daily. (Patient not taking: Reported on 02/21/2021) 30 tablet 0   No facility-administered medications prior to visit.    Allergies  Allergen Reactions   Lisinopril Anaphylaxis and Other (See Comments)    angioedema   Carvedilol Other (See Comments)    wheezing   Ezetimibe-Simvastatin Other (See Comments)    Serious vertigo   Propranolol Hcl Other (See Comments)    wheezing   Ramipril  Other (See Comments)    wheezing   Codeine Nausea Only    ROS Review of Systems    Objective:    Physical Exam Constitutional:      Appearance: Normal appearance. He is well-developed.  HENT:     Head: Normocephalic and atraumatic.  Cardiovascular:     Rate and Rhythm: Normal rate and regular rhythm.     Heart sounds: Normal heart sounds.  Pulmonary:     Effort: Pulmonary effort is normal.     Breath sounds: Normal breath sounds.  Skin:    General: Skin is warm and dry.  Neurological:     Mental Status: He is alert and oriented to person, place, and time. Mental status is at baseline.  Psychiatric:        Behavior: Behavior normal.    BP (!) 163/98    Pulse 70    Temp 98.2 F (36.8 C)    Resp 16    Ht 6\' 4"  (1.93 m)    Wt 196 lb (88.9 kg)    SpO2 99%    BMI 23.86 kg/m  Wt Readings from Last 3 Encounters:  02/21/21 196 lb (88.9 kg)  11/23/20 195 lb 3.2 oz (88.5 kg)  08/23/20 207 lb (93.9 kg)     Health Maintenance Due  Topic Date Due   COLONOSCOPY (Pts 45-31yrs Insurance coverage will need to be confirmed)  02/07/2021    There are no preventive care reminders to display for this patient.  Lab Results  Component Value Date   TSH 1.14 07/27/2016   Lab Results  Component Value Date   WBC 3.5 10/20/2020   HGB 13.0 10/20/2020   HCT 39.1 10/20/2020   MCV 89 10/20/2020   PLT 161 10/20/2020   Lab Results  Component Value Date   NA 138 10/20/2020   K 4.5 10/20/2020   CO2 25 10/20/2020  GLUCOSE 83 10/20/2020   BUN 6 (L) 10/20/2020   CREATININE 0.96 10/20/2020   BILITOT 0.9 10/20/2020   ALKPHOS 85 10/20/2020   AST 46 (H) 10/20/2020   ALT 35 10/20/2020   PROT 7.2 10/20/2020   ALBUMIN 4.4 10/20/2020   CALCIUM 9.0 10/20/2020   ANIONGAP 9 02/13/2018   EGFR 84 10/20/2020   GFR 96.14 05/28/2014   Lab Results  Component Value Date   CHOL 124 10/20/2020   Lab Results  Component Value Date   HDL 37 (L) 10/20/2020   Lab Results  Component Value Date    LDLCALC 69 10/20/2020   Lab Results  Component Value Date   TRIG 95 10/20/2020   Lab Results  Component Value Date   CHOLHDL 3.4 10/20/2020   Lab Results  Component Value Date   HGBA1C 5.6 10/31/2018      Assessment & Plan:   Problem List Items Addressed This Visit       Cardiovascular and Mediastinum   NICM (nonischemic cardiomyopathy) ([place])   Essential hypertension - Primary    Blood pressure is not well controlled today.  Repeat was still elevated.  Currently on Imdur and metoprolol.  He gets angioedema with lisinopril and wheezing with ramipril.  We will have him come back in 2 weeks for nurse visit recheck blood pressure.      Relevant Orders   COMPLETE METABOLIC PANEL WITH GFR     Respiratory   Asthma    Does have a little bit of wheeze in the left lower lung posteriorly.  He said he did using his inhaler this morning and has been getting a little chest congestion in the mornings.  Just encouraged him to call if he feels like he is not improving or if he suddenly gets worse.        Endocrine   IFG (impaired fasting glucose)    Recheck hemoglobin A1c today.  He has not been as active lately.      Relevant Orders   COMPLETE METABOLIC PANEL WITH GFR   Hemoglobin A1c     Genitourinary   BPH (benign prostatic hyperplasia)   Relevant Orders   PSA     Other   H/O mitral valve replacement with mechanical valve    Coumadin being managed by cardiology.      Current use of long term anticoagulation   Anxiety state    Is felt a little bit more anxious around the holidays that he has an upcoming colonoscopy is a little nervous about he would like to try going up on his Paxil.  New prescription sent to pharmacy.      Relevant Medications   PARoxetine (PAXIL) 30 MG tablet   Other Visit Diagnoses     Elevated liver enzymes       Relevant Orders   COMPLETE METABOLIC PANEL WITH GFR   Screen for colon cancer       Relevant Orders   PSA      Plan to  recheck liver enzymes today.  Meds ordered this encounter  Medications   PARoxetine (PAXIL) 30 MG tablet    Sig: Take 1 tablet (30 mg total) by mouth daily.    Dispense:  90 tablet    Refill:  1    Follow-up: Return in about 6 months (around 08/22/2021).    Beatrice Lecher, MD

## 2021-02-21 NOTE — Telephone Encounter (Signed)
Thank you for the update. I will see him in clinic in January and his procedure is already scheduled in February. GM

## 2021-02-22 ENCOUNTER — Other Ambulatory Visit: Payer: Self-pay | Admitting: Family Medicine

## 2021-02-22 ENCOUNTER — Other Ambulatory Visit: Payer: Self-pay

## 2021-02-22 DIAGNOSIS — R748 Abnormal levels of other serum enzymes: Secondary | ICD-10-CM

## 2021-02-22 LAB — COMPLETE METABOLIC PANEL WITH GFR
AG Ratio: 1.3 (calc) (ref 1.0–2.5)
ALT: 33 U/L (ref 9–46)
AST: 46 U/L — ABNORMAL HIGH (ref 10–35)
Albumin: 4.3 g/dL (ref 3.6–5.1)
Alkaline phosphatase (APISO): 77 U/L (ref 35–144)
BUN: 9 mg/dL (ref 7–25)
CO2: 30 mmol/L (ref 20–32)
Calcium: 9.7 mg/dL (ref 8.6–10.3)
Chloride: 99 mmol/L (ref 98–110)
Creat: 0.86 mg/dL (ref 0.70–1.28)
Globulin: 3.3 g/dL (calc) (ref 1.9–3.7)
Glucose, Bld: 89 mg/dL (ref 65–99)
Potassium: 4.5 mmol/L (ref 3.5–5.3)
Sodium: 135 mmol/L (ref 135–146)
Total Bilirubin: 0.8 mg/dL (ref 0.2–1.2)
Total Protein: 7.6 g/dL (ref 6.1–8.1)
eGFR: 92 mL/min/{1.73_m2} (ref >=60)

## 2021-02-22 LAB — PSA: PSA: 1.4 ng/mL (ref <=4.00)

## 2021-02-22 LAB — HEMOGLOBIN A1C
Hgb A1c MFr Bld: 5.7 % of total Hgb — ABNORMAL HIGH (ref <5.7)
Mean Plasma Glucose: 117 mg/dL
eAG (mmol/L): 6.5 mmol/L

## 2021-02-22 NOTE — Progress Notes (Signed)
Hi [place], your metabolic panel overall looks okay except her AST which is one of the liver enzymes is still mildly elevated.  Similar to last time.  Just make sure you are keeping any type of alcohol or Tylenol intake to an absolute minimum.  I would like to consider an ultrasound of your liver so no radiation just sound waves since that enzyme has been elevated for year now.  Its not at a worrisome level but I also think since it staying persistently elevated we should pay attention to it.  If you are okay with ultrasound please let us know.  We can get that scheduled in January.

## 2021-02-22 NOTE — Progress Notes (Signed)
Ordered ultra sound per result note.

## 2021-02-22 NOTE — Progress Notes (Signed)
Orders Placed This Encounter  Procedures   US ABDOMEN COMPLETE W/ELASTOGRAPHY    Standing Status:   Future    Standing Expiration Date:   02/22/2022    Scheduling Instructions:     Please cancel RUQ [place]    Order Specific Question:   Reason for Exam (SYMPTOM  OR DIAGNOSIS REQUIRED)    Answer:   elevated liver enzymes    Order Specific Question:   Preferred imaging location?    Answer:   [facility]

## 2021-03-09 ENCOUNTER — Other Ambulatory Visit: Payer: Self-pay

## 2021-03-09 ENCOUNTER — Ambulatory Visit (INDEPENDENT_AMBULATORY_CARE_PROVIDER_SITE_OTHER): Payer: Medicare Other

## 2021-03-09 DIAGNOSIS — Z5181 Encounter for therapeutic drug level monitoring: Secondary | ICD-10-CM | POA: Diagnosis not present

## 2021-03-09 DIAGNOSIS — Z7901 Long term (current) use of anticoagulants: Secondary | ICD-10-CM

## 2021-03-09 DIAGNOSIS — Z9889 Other specified postprocedural states: Secondary | ICD-10-CM

## 2021-03-09 LAB — POCT INR: INR: 6.9 — AB (ref 2.0–3.0)

## 2021-03-09 NOTE — Patient Instructions (Signed)
HOLD tonight, Thursday and Friday only and then decrease to 1 tablet daily, except 0.5 tablet on Monday, Wednesday and Friday. Repeat INR in 1 week.

## 2021-03-10 ENCOUNTER — Ambulatory Visit (HOSPITAL_COMMUNITY)
Admission: RE | Admit: 2021-03-10 | Discharge: 2021-03-10 | Disposition: A | Discharge status: Home / Self Care | Payer: Medicare Other | Source: Ambulatory Visit | Attending: Family Medicine | Admitting: Family Medicine

## 2021-03-10 DIAGNOSIS — R7989 Other specified abnormal findings of blood chemistry: Secondary | ICD-10-CM | POA: Diagnosis not present

## 2021-03-10 DIAGNOSIS — N2889 Other specified disorders of kidney and ureter: Secondary | ICD-10-CM | POA: Diagnosis not present

## 2021-03-10 DIAGNOSIS — R748 Abnormal levels of other serum enzymes: Secondary | ICD-10-CM | POA: Insufficient documentation

## 2021-03-10 NOTE — Progress Notes (Signed)
HI Corey Mathis,  The [place] of your liver overall looks good.  No lesions or mass, etc.  You do have a spot on your left kidney.  It measures about 2 cm.  They could not tell if it was a more complicated cystic lesion or if it was just artifact so they did recommend further evaluation with MRI renal protocol with and without contrast.  If you are okay with [place] ordering this then please let us know.

## 2021-03-11 ENCOUNTER — Other Ambulatory Visit: Payer: Self-pay

## 2021-03-11 DIAGNOSIS — N2889 Other specified disorders of kidney and ureter: Secondary | ICD-10-CM

## 2021-03-15 ENCOUNTER — Other Ambulatory Visit: Payer: Self-pay | Admitting: *Deleted

## 2021-03-15 MED ORDER — WARFARIN SODIUM 10 MG PO TABS: ORAL_TABLET | ORAL | 0 refills | Status: DC

## 2021-03-15 NOTE — Telephone Encounter (Signed)
Prescription refill request received for warfarin [place]: [place], 11/23/2020 Next INR check: 1/11 Warfarin tablet strength: 10mg    Refill sent.

## 2021-03-16 ENCOUNTER — Other Ambulatory Visit: Payer: Self-pay

## 2021-03-16 ENCOUNTER — Ambulatory Visit (INDEPENDENT_AMBULATORY_CARE_PROVIDER_SITE_OTHER): Payer: Medicare Other

## 2021-03-16 DIAGNOSIS — Z9889 Other specified postprocedural states: Secondary | ICD-10-CM | POA: Diagnosis not present

## 2021-03-16 DIAGNOSIS — Z5181 Encounter for therapeutic drug level monitoring: Secondary | ICD-10-CM | POA: Diagnosis not present

## 2021-03-16 DIAGNOSIS — Z7901 Long term (current) use of anticoagulants: Secondary | ICD-10-CM | POA: Diagnosis not present

## 2021-03-16 LAB — POCT INR: INR: 1.8 — AB (ref 2.0–3.0)

## 2021-03-16 NOTE — Patient Instructions (Signed)
TAKE 1 TABLET TONIGHT ONLY and then continue  1 tablet daily, except 0.5 tablet on Monday, Wednesday and Friday. Repeat INR in 4 weeks.  Set up Lovenox Bridge.  Colonoscopy 2/16

## 2021-03-25 ENCOUNTER — Ambulatory Visit (INDEPENDENT_AMBULATORY_CARE_PROVIDER_SITE_OTHER): Payer: Medicare Other | Admitting: Gastroenterology

## 2021-03-25 ENCOUNTER — Encounter: Payer: Self-pay | Admitting: Gastroenterology

## 2021-03-25 VITALS — BP 130/80 | HR 67 | Ht 76.0 in | Wt 189.0 lb

## 2021-03-25 DIAGNOSIS — Z86010 Personal history of colonic polyps: Secondary | ICD-10-CM

## 2021-03-25 DIAGNOSIS — K91840 Postprocedural hemorrhage and hematoma of a digestive system organ or structure following a digestive system procedure: Secondary | ICD-10-CM

## 2021-03-25 DIAGNOSIS — Z7901 Long term (current) use of anticoagulants: Secondary | ICD-10-CM

## 2021-03-25 NOTE — Patient Instructions (Signed)
You have been scheduled for a colonoscopy. Please follow written instructions given to you at your visit today.  Please pick up your prep supplies at the pharmacy within the next 1-3 days. If you use inhalers (even only as needed), please bring them with you on the day of your procedure.  If you are age 73 or older, your body mass index should be between 23-30. Your Body mass index is 23.01 kg/m. If this is out of the aforementioned range listed, please consider follow up with your Primary Care Provider.  If you are age 60 or younger, your body mass index should be between 19-25. Your Body mass index is 23.01 kg/m. If this is out of the aformentioned range listed, please consider follow up with your Primary Care Provider.   ________________________________________________________  [facility] [place] [facility] providers would like to encourage you to use [facility] to communicate with providers for non-urgent requests or questions.  Due to long hold times on the telephone, sending your provider a message by [facility] may be a faster and more efficient way to get a response.  Please allow 48 business hours for a response.  Please remember that this is for non-urgent requests.  _______________________________________________________  Due to recent changes in healthcare laws, you may see the results of your imaging and laboratory studies on MyChart before your provider has had a chance to review them.  We understand that in some cases there may be results that are confusing or concerning to you. Not all laboratory results come back in the same time frame and the provider may be waiting for multiple results in order to interpret others.  Please give [place] 48 hours in order for your provider to thoroughly review all the results before contacting the office for clarification of your results.   Thank you for choosing me and [facility].  Dr. Rush Landmark

## 2021-03-26 ENCOUNTER — Encounter: Payer: Self-pay | Admitting: Gastroenterology

## 2021-03-26 DIAGNOSIS — Z86010 Personal history of colonic polyps: Secondary | ICD-10-CM | POA: Insufficient documentation

## 2021-03-26 NOTE — H&P (View-Only) (Signed)
[place] VISIT   Primary Care Provider [name], [place] [address] [phone]   Patient Profile Corey Mathis is a 71 y.o. 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  The patient presents to the [facility] for an evaluation and management of problem(s) noted below:  Problem List 1. Hx of adenomatous colonic polyps   2. History of post polypectomy bleeding    3. Chronic anticoagulation     History of Present Illness Please see prior notes for full details of HPI.  Interval History The patient returns for a follow-up.  I have not seen the patient since his follow-up in 2020.  At that time we had performed a follow-up since he had a post polypectomy bleed back in 2019.  He has done very well since then.  Bowel habits are normal.  He is not having any bleeding.  He is ready for repeat colonoscopy which is scheduled in the coming weeks.  He will need to be bridged again with Lovenox as a result of his mechanical mitral valve.  He is not experiencing any other GI symptoms at this time.  GI Review of Systems Positive as above Negative for dysphagia, odynophagia, nausea, vomiting, alteration of bowel habits, melena, hematochezia  Review of Systems General: Denies fevers/chills/weight loss unintentionally Cardiovascular: Denies chest pain Pulmonary: Denies shortness of breath Gastroenterological: See HPI Genitourinary: Denies darkened urine or hematuria Hematological: Denies easy bruising/bleeding Dermatological: Denies jaundice Psychological: Mood is stable   Medications Current Outpatient Medications  Medication Sig Dispense Refill   albuterol (PROAIR HFA) 108 (90 Base) MCG/ACT inhaler Inhale 2 puffs into the lungs every 6 (six) hours as needed for wheezing. 3 Inhaler 2    antiseptic oral rinse (BIOTENE) LIQD 5 mLs by Mouth Rinse route 2 (two) times daily.     aspirin 81 MG EC tablet Take 81 mg by mouth at bedtime.     cholecalciferol (VITAMIN D) 1000 units tablet Take 1,000 Units by mouth at bedtime.     COENZYME Q10 PO Take 1 capsule by mouth at bedtime.     fluticasone (FLONASE) 50 MCG/ACT nasal spray Place 1 spray into both nostrils daily as needed (congestion). 16 g 0   fluticasone-salmeterol (ADVAIR) 250-50 MCG/ACT AEPB INHALE ONE PUFF BY MOUTH TWICE A DAY 60 each 11   hydrALAZINE (APRESOLINE) 50 MG tablet Take 1.5 tablets (75 mg total) by mouth 3 (three) times daily. 405 tablet 3   isosorbide mononitrate (IMDUR) 60 MG 24 hr tablet Take 1 tablet (60 mg total) by mouth daily. 90 tablet 3   Melatonin 10 MG TABS Take 1 tablet by mouth as needed.     metoprolol succinate (TOPROL-XL) 100 MG 24 hr tablet Take 1 tablet by mouth once daily 90 tablet 0   metoprolol succinate (TOPROL-XL) 50 MG 24 hr tablet TAKE 1 TABLET BY MOUTH ONCE DAILY AT BEDTIME WITH A 100 MG TABLET FOR A TOTAL OF 150MG  90 tablet 1   Multiple Vitamin (MULTIVITAMIN WITH MINERALS) TABS tablet Take 1 tablet by mouth at bedtime.     PARoxetine (PAXIL) 30 MG tablet Take 1 tablet (30 mg total) by mouth daily. 90 tablet 1   Probiotic Product (PROBIOTIC PO) Take 1 tablet by mouth daily.     vitamin C (ASCORBIC ACID) 500 MG tablet Take 500 mg by mouth daily.  warfarin (COUMADIN) 10 MG tablet Take 1/2 a tablet to 1 tablet by mouth daily as directed by the coumadin clinic. 110 tablet 0   atorvastatin (LIPITOR) 40 MG tablet Take 1 tablet (40 mg total) by mouth daily. 90 tablet 3   No current facility-administered medications for this visit.    Allergies Allergies  Allergen Reactions   Lisinopril Anaphylaxis and Other (See Comments)    angioedema   Carvedilol Other (See Comments)    wheezing   Ezetimibe-Simvastatin Other (See Comments)    Serious vertigo   Propranolol Hcl Other (See Comments)     wheezing   Ramipril Other (See Comments)    wheezing   Codeine Nausea Only    Histories Past Medical History:  Diagnosis Date   Allergy    Anxiety    Asthma    CHF (congestive heart failure) (HCC)    Dyslipidemia    History of mitral valve replacement    Hypertension    Labyrinthitis    hx   Past Surgical History:  Procedure Laterality Date   ACNE CYST REMOVAL     from hand and back   EXTERNAL FIXATION LEG Right 08/11/2017   Procedure: EXTERNAL FIXATION, RIGHT KNEE;  Surgeon: Leandrew Koyanagi, MD;  Location: [place];  Service: Orthopedics;  Laterality: Right;   FINE NEEDLE ASPIRATION Right 08/11/2017   Procedure: FINE NEEDLE ASPIRATION  of right Knee with 80 cc of dark red fluid removed;  Surgeon: Leandrew Koyanagi, MD;  Location: [place];  Service: Orthopedics;  Laterality: Right;   FLEXIBLE SIGMOIDOSCOPY N/A 02/11/2018   Procedure: FLEXIBLE SIGMOIDOSCOPY;  Surgeon: Doran Stabler, MD;  Location: [place];  Service: Gastroenterology;  Laterality: N/A;   Jaw surgery (other)     NASAL SINUS SURGERY N/A 10/16/2012   Procedure: NASAL ENDOSCOPIC POLYPECTOMY/MAXILLARY ANTROSTOMY/ETHMOIDECTOMY;  Surgeon: Izora Gala, MD;  [facility];  Service: ENT;  Laterality: N/A;   ORIF TIBIA PLATEAU Left 08/13/2017   Procedure: OPEN REDUCTION INTERNAL FIXATION (ORIF) TIBIAL PLATEAU;  Surgeon: Leandrew Koyanagi, MD;  Location: [place];  Service: Orthopedics;  Laterality: Left;   ORIF TIBIA PLATEAU Right 08/22/2017   Procedure: OPEN REDUCTION INTERNAL FIXATION (ORIF) RIGHT BICONDYLAR TIBIAL PLATEAU, REMOVAL OF EX FIX;  Surgeon: Leandrew Koyanagi, MD;  Location: [place];  Service: Orthopedics;  Laterality: Right;   RIGHT/LEFT HEART CATH AND CORONARY ANGIOGRAPHY N/A 07/12/2017   Procedure: RIGHT/LEFT HEART CATH AND CORONARY ANGIOGRAPHY;  Surgeon: Burnell Blanks, MD;  Location: [place] [facility];  Service: Cardiovascular;  Laterality: N/A;   SUBMUCOSAL INJECTION  02/11/2018   Procedure: SUBMUCOSAL INJECTION;   Surgeon: Doran Stabler, MD;  Location: [facility];  Service: Gastroenterology;;   TOOTH EXTRACTION     VALVE REPLACEMENT  1998   St. Jude, mitral   Social History   Socioeconomic History   Marital status: Married    Spouse name: Frenchie    Number of children: 2   Years of education: 20   Highest education level: Professional school degree (e.g., MD, DDS, DVM, JD)  Occupational History   Occupation: retired    Fish farm manager: [employer]  Tobacco Use   Smoking status: Never   Smokeless tobacco: Never  Vaping Use   Vaping Use: Never used  Substance and Sexual Activity   Alcohol use: Never   Drug use: Never   Sexual activity: Not Currently  Other Topics Concern   Not on file  Social History Narrative   Lives alone.  He has two children. He enjoys reading and house stuff.   Social Determinants of Health   Financial Resource Strain: Low Risk    Difficulty of Paying Living Expenses: Not hard at all  Food Insecurity: No Food Insecurity   Worried About Charity fundraiser in the Last Year: Never true   [place] in the Last Year: Never true  Transportation Needs: No Transportation Needs   Lack of Transportation (Medical): No   Lack of Transportation (Non-Medical): No  Physical Activity: Sufficiently Active   Days of Exercise per Week: 7 days   Minutes of Exercise per Session: 30 min  Stress: No Stress Concern Present   Feeling of Stress : Not at all  Social Connections: Moderately Isolated   Frequency of Communication with Friends and Family: More than three times a week   Frequency of Social Gatherings with Friends and Family: Once a week   Attends Religious Services: Never   Marine scientist or Organizations: No   Attends Music therapist: Never   Marital Status: Married  Human resources officer Violence: Not At Risk   Fear of Current or Ex-Partner: No   Emotionally Abused: No   Physically Abused: No   Sexually Abused: No   Family History   Problem Relation Age of Onset   Heart attack Father 57   Colon cancer Mother 27   Hypertension Mother    Breast cancer Maternal Aunt    Stomach cancer Neg Hx    Esophageal cancer Neg Hx    Inflammatory bowel disease Neg Hx    Liver disease Neg Hx    Pancreatic cancer Neg Hx    I have reviewed his medical, social, and family history in detail and updated the electronic medical record as necessary.    PHYSICAL EXAMINATION  BP 130/80    Pulse 67    Ht 6\' 4"  (1.93 m)    Wt 189 lb (85.7 kg)    BMI 23.01 kg/m  Wt Readings from Last 3 Encounters:  03/25/21 189 lb (85.7 kg)  02/21/21 196 lb (88.9 kg)  11/23/20 195 lb 3.2 oz (88.5 kg)  GEN: NAD, appears stated age, doesn't appear chronically ill PSYCH: Cooperative, without pressured speech EYE: Conjunctivae pink, sclerae anicteric ENT: MMM CV: Mechanical click noted but otherwise no rubs or gallops RESP: No wheezing appreciated GI: NABS, soft, NT/ND, without rebound MSK/EXT: No lower extremity edema SKIN: No jaundice NEURO:  Alert & Oriented x 3, no focal deficits   REVIEW OF DATA  I reviewed the following data at the time of this encounter:  GI Procedures and Studies  We rereviewed the 2019 colonoscopy and subsequent flexible sigmoidoscopy as below February 11, 2018 flexible sigmoidoscopy - Preparation of the colon was poor. - Blood in the entire examined colon. - An actively bleeding polypectomy site in the distal transverse colon. Injected. Tattooed.  Clips (MR conditional) were placed. Bleeding controlled. - No specimens collected.  February 07, 2018 colonoscopy - Non-thrombosed internal hemorrhoids found on digital rectal exam. - The examined portion of the ileum was normal. - One 13 mm polyp in the descending colon, removed with mucosal resection. Resected and retrieved. Clips (MR conditional) were placed. - One 2 mm polyp at the recto-sigmoid colon, removed with a cold snare. Resected and retrieved. - Otherwise, normal  mucosa in the entire examined colon. - Non-bleeding non-thrombosed internal hemorrhoids.  Laboratory Studies  Reviewed in epic  Imaging Studies  No relevant studies to review  ASSESSMENT  Mr. Franzel is a 71 y.o. 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  The patient is seen today for evaluation and management of:  1. Hx of adenomatous colonic polyps   2. History of post polypectomy bleeding    3. Chronic anticoagulation    The patient is hemodynamically and clinically stable at this time.  He continues to do well from a GI perspective and has had no recurrence of any bleeding at this time.  He is scheduled for colonoscopy coming weeks.  As previously discussed we will clip aggressively if needed to try and help prevent another post polypectomy bleed, especially if cautery is utilized.  The risks and benefits of endoscopic evaluation were discussed with the patient; these include but are not limited to the risk of perforation, infection, bleeding, missed lesions, lack of diagnosis, severe illness requiring hospitalization, as well as anesthesia and sedation related illnesses.  The patient and/or family is agreeable to proceed.  All patient questions were answered to the best of my ability, and the patient agrees to the aforementioned plan of action with follow-up as indicated.   PLAN  Proceed with scheduling colonoscopy in the coming weeks Appreciate cardiology/anticoagulation clinic bridging   Orders Placed This Encounter  Procedures   Ambulatory referral to Gastroenterology    New Prescriptions   No medications on file   Modified Medications   No medications on file    Planned Follow Up: No follow-ups on file.  Total Time in Face-to-Face and in Coordination of Care for patient including independent/personal interpretation/review of prior testing, medical history,  examination, medication adjustment, communicating results with the patient directly, and documentation within the EHR is 20 minutes.   Justice Britain, MD [facility] Office # [phone]

## 2021-03-26 NOTE — Progress Notes (Signed)
[place] VISIT   Primary Care Provider [name], [place] [address] [phone]   Patient Profile Corey Mathis is a 71 y.o. 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  The patient presents to the [facility] for an evaluation and management of problem(s) noted below:  Problem List 1. Hx of adenomatous colonic polyps   2. History of post polypectomy bleeding    3. Chronic anticoagulation     History of Present Illness Please see prior notes for full details of HPI.  Interval History The patient returns for a follow-up.  I have not seen the patient since his follow-up in 2020.  At that time we had performed a follow-up since he had a post polypectomy bleed back in 2019.  He has done very well since then.  Bowel habits are normal.  He is not having any bleeding.  He is ready for repeat colonoscopy which is scheduled in the coming weeks.  He will need to be bridged again with Lovenox as a result of his mechanical mitral valve.  He is not experiencing any other GI symptoms at this time.  GI Review of Systems Positive as above Negative for dysphagia, odynophagia, nausea, vomiting, alteration of bowel habits, melena, hematochezia  Review of Systems General: Denies fevers/chills/weight loss unintentionally Cardiovascular: Denies chest pain Pulmonary: Denies shortness of breath Gastroenterological: See HPI Genitourinary: Denies darkened urine or hematuria Hematological: Denies easy bruising/bleeding Dermatological: Denies jaundice Psychological: Mood is stable   Medications Current Outpatient Medications  Medication Sig Dispense Refill   albuterol (PROAIR HFA) 108 (90 Base) MCG/ACT inhaler Inhale 2 puffs into the lungs every 6 (six) hours as needed for wheezing. 3 Inhaler 2    antiseptic oral rinse (BIOTENE) LIQD 5 mLs by Mouth Rinse route 2 (two) times daily.     aspirin 81 MG EC tablet Take 81 mg by mouth at bedtime.     cholecalciferol (VITAMIN D) 1000 units tablet Take 1,000 Units by mouth at bedtime.     COENZYME Q10 PO Take 1 capsule by mouth at bedtime.     fluticasone (FLONASE) 50 MCG/ACT nasal spray Place 1 spray into both nostrils daily as needed (congestion). 16 g 0   fluticasone-salmeterol (ADVAIR) 250-50 MCG/ACT AEPB INHALE ONE PUFF BY MOUTH TWICE A DAY 60 each 11   hydrALAZINE (APRESOLINE) 50 MG tablet Take 1.5 tablets (75 mg total) by mouth 3 (three) times daily. 405 tablet 3   isosorbide mononitrate (IMDUR) 60 MG 24 hr tablet Take 1 tablet (60 mg total) by mouth daily. 90 tablet 3   Melatonin 10 MG TABS Take 1 tablet by mouth as needed.     metoprolol succinate (TOPROL-XL) 100 MG 24 hr tablet Take 1 tablet by mouth once daily 90 tablet 0   metoprolol succinate (TOPROL-XL) 50 MG 24 hr tablet TAKE 1 TABLET BY MOUTH ONCE DAILY AT BEDTIME WITH A 100 MG TABLET FOR A TOTAL OF 150MG  90 tablet 1   Multiple Vitamin (MULTIVITAMIN WITH MINERALS) TABS tablet Take 1 tablet by mouth at bedtime.     PARoxetine (PAXIL) 30 MG tablet Take 1 tablet (30 mg total) by mouth daily. 90 tablet 1   Probiotic Product (PROBIOTIC PO) Take 1 tablet by mouth daily.     vitamin C (ASCORBIC ACID) 500 MG tablet Take 500 mg by mouth daily.  warfarin (COUMADIN) 10 MG tablet Take 1/2 a tablet to 1 tablet by mouth daily as directed by the coumadin clinic. 110 tablet 0   atorvastatin (LIPITOR) 40 MG tablet Take 1 tablet (40 mg total) by mouth daily. 90 tablet 3   No current facility-administered medications for this visit.    Allergies Allergies  Allergen Reactions   Lisinopril Anaphylaxis and Other (See Comments)    angioedema   Carvedilol Other (See Comments)    wheezing   Ezetimibe-Simvastatin Other (See Comments)    Serious vertigo   Propranolol Hcl Other (See Comments)     wheezing   Ramipril Other (See Comments)    wheezing   Codeine Nausea Only    Histories Past Medical History:  Diagnosis Date   Allergy    Anxiety    Asthma    CHF (congestive heart failure) (HCC)    Dyslipidemia    History of mitral valve replacement    Hypertension    Labyrinthitis    hx   Past Surgical History:  Procedure Laterality Date   ACNE CYST REMOVAL     from hand and back   EXTERNAL FIXATION LEG Right 08/11/2017   Procedure: EXTERNAL FIXATION, RIGHT KNEE;  Surgeon: Leandrew Koyanagi, MD;  Location: [place];  Service: Orthopedics;  Laterality: Right;   FINE NEEDLE ASPIRATION Right 08/11/2017   Procedure: FINE NEEDLE ASPIRATION  of right Knee with 80 cc of dark red fluid removed;  Surgeon: Leandrew Koyanagi, MD;  Location: [place];  Service: Orthopedics;  Laterality: Right;   FLEXIBLE SIGMOIDOSCOPY N/A 02/11/2018   Procedure: FLEXIBLE SIGMOIDOSCOPY;  Surgeon: Doran Stabler, MD;  Location: [place];  Service: Gastroenterology;  Laterality: N/A;   Jaw surgery (other)     NASAL SINUS SURGERY N/A 10/16/2012   Procedure: NASAL ENDOSCOPIC POLYPECTOMY/MAXILLARY ANTROSTOMY/ETHMOIDECTOMY;  Surgeon: Izora Gala, MD;  Location: [facility] OR;  Service: ENT;  Laterality: N/A;   ORIF TIBIA PLATEAU Left 08/13/2017   Procedure: OPEN REDUCTION INTERNAL FIXATION (ORIF) TIBIAL PLATEAU;  Surgeon: Leandrew Koyanagi, MD;  Location: [place];  Service: Orthopedics;  Laterality: Left;   ORIF TIBIA PLATEAU Right 08/22/2017   Procedure: OPEN REDUCTION INTERNAL FIXATION (ORIF) RIGHT BICONDYLAR TIBIAL PLATEAU, REMOVAL OF EX FIX;  Surgeon: Leandrew Koyanagi, MD;  Location: [place];  Service: Orthopedics;  Laterality: Right;   RIGHT/LEFT HEART CATH AND CORONARY ANGIOGRAPHY N/A 07/12/2017   Procedure: RIGHT/LEFT HEART CATH AND CORONARY ANGIOGRAPHY;  Surgeon: Burnell Blanks, MD;  Location: [place] [facility];  Service: Cardiovascular;  Laterality: N/A;   SUBMUCOSAL INJECTION  02/11/2018   Procedure: SUBMUCOSAL INJECTION;   Surgeon: Doran Stabler, MD;  Location: [facility];  Service: Gastroenterology;;   TOOTH EXTRACTION     VALVE REPLACEMENT  1998   St. Jude, mitral   Social History   Socioeconomic History   Marital status: Married    Spouse name: Frenchie    Number of children: 2   Years of education: 20   Highest education level: Professional school degree (e.g., MD, DDS, DVM, JD)  Occupational History   Occupation: retired    Fish farm manager: [employer]  Tobacco Use   Smoking status: Never   Smokeless tobacco: Never  Vaping Use   Vaping Use: Never used  Substance and Sexual Activity   Alcohol use: Never   Drug use: Never   Sexual activity: Not Currently  Other Topics Concern   Not on file  Social History Narrative   Lives alone.  He has two children. He enjoys reading and house stuff.   Social Determinants of Health   Financial Resource Strain: Low Risk    Difficulty of Paying Living Expenses: Not hard at all  Food Insecurity: No Food Insecurity   Worried About Charity fundraiser in the Last Year: Never true   [place] in the Last Year: Never true  Transportation Needs: No Transportation Needs   Lack of Transportation (Medical): No   Lack of Transportation (Non-Medical): No  Physical Activity: Sufficiently Active   Days of Exercise per Week: 7 days   Minutes of Exercise per Session: 30 min  Stress: No Stress Concern Present   Feeling of Stress : Not at all  Social Connections: Moderately Isolated   Frequency of Communication with Friends and Family: More than three times a week   Frequency of Social Gatherings with Friends and Family: Once a week   Attends Religious Services: Never   Marine scientist or Organizations: No   Attends Music therapist: Never   Marital Status: Married  Human resources officer Violence: Not At Risk   Fear of Current or Ex-Partner: No   Emotionally Abused: No   Physically Abused: No   Sexually Abused: No   Family History   Problem Relation Age of Onset   Heart attack Father 39   Colon cancer Mother 20   Hypertension Mother    Breast cancer Maternal Aunt    Stomach cancer Neg Hx    Esophageal cancer Neg Hx    Inflammatory bowel disease Neg Hx    Liver disease Neg Hx    Pancreatic cancer Neg Hx    I have reviewed his medical, social, and family history in detail and updated the electronic medical record as necessary.    PHYSICAL EXAMINATION  BP 130/80    Pulse 67    Ht 6\' 4"  (1.93 m)    Wt 189 lb (85.7 kg)    BMI 23.01 kg/m  Wt Readings from Last 3 Encounters:  03/25/21 189 lb (85.7 kg)  02/21/21 196 lb (88.9 kg)  11/23/20 195 lb 3.2 oz (88.5 kg)  GEN: NAD, appears stated age, doesn't appear chronically ill PSYCH: Cooperative, without pressured speech EYE: Conjunctivae pink, sclerae anicteric ENT: MMM CV: Mechanical click noted but otherwise no rubs or gallops RESP: No wheezing appreciated GI: NABS, soft, NT/ND, without rebound MSK/EXT: No lower extremity edema SKIN: No jaundice NEURO:  Alert & Oriented x 3, no focal deficits   REVIEW OF DATA  I reviewed the following data at the time of this encounter:  GI Procedures and Studies  We rereviewed the 2019 colonoscopy and subsequent flexible sigmoidoscopy as below February 11, 2018 flexible sigmoidoscopy - Preparation of the colon was poor. - Blood in the entire examined colon. - An actively bleeding polypectomy site in the distal transverse colon. Injected. Tattooed.  Clips (MR conditional) were placed. Bleeding controlled. - No specimens collected.  February 07, 2018 colonoscopy - Non-thrombosed internal hemorrhoids found on digital rectal exam. - The examined portion of the ileum was normal. - One 13 mm polyp in the descending colon, removed with mucosal resection. Resected and retrieved. Clips (MR conditional) were placed. - One 2 mm polyp at the recto-sigmoid colon, removed with a cold snare. Resected and retrieved. - Otherwise, normal  mucosa in the entire examined colon. - Non-bleeding non-thrombosed internal hemorrhoids.  Laboratory Studies  Reviewed in epic  Imaging Studies  No relevant studies to review  ASSESSMENT  Mr. Cauthorn is a 71 y.o. 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  The patient is seen today for evaluation and management of:  1. Hx of adenomatous colonic polyps   2. History of post polypectomy bleeding    3. Chronic anticoagulation    The patient is hemodynamically and clinically stable at this time.  He continues to do well from a GI perspective and has had no recurrence of any bleeding at this time.  He is scheduled for colonoscopy coming weeks.  As previously discussed we will clip aggressively if needed to try and help prevent another post polypectomy bleed, especially if cautery is utilized.  The risks and benefits of endoscopic evaluation were discussed with the patient; these include but are not limited to the risk of perforation, infection, bleeding, missed lesions, lack of diagnosis, severe illness requiring hospitalization, as well as anesthesia and sedation related illnesses.  The patient and/or family is agreeable to proceed.  All patient questions were answered to the best of my ability, and the patient agrees to the aforementioned plan of action with follow-up as indicated.   PLAN  Proceed with scheduling colonoscopy in the coming weeks Appreciate cardiology/anticoagulation clinic bridging   Orders Placed This Encounter  Procedures   Ambulatory referral to Gastroenterology    New Prescriptions   No medications on file   Modified Medications   No medications on file    Planned Follow Up: No follow-ups on file.  Total Time in Face-to-Face and in Coordination of Care for patient including independent/personal interpretation/review of prior testing, medical history,  examination, medication adjustment, communicating results with the patient directly, and documentation within the EHR is 20 minutes.   Justice Britain, MD [facility] Office # [phone]

## 2021-04-11 ENCOUNTER — Ambulatory Visit (INDEPENDENT_AMBULATORY_CARE_PROVIDER_SITE_OTHER): Payer: Medicare Other

## 2021-04-11 ENCOUNTER — Other Ambulatory Visit: Payer: Self-pay

## 2021-04-11 DIAGNOSIS — Z7901 Long term (current) use of anticoagulants: Secondary | ICD-10-CM | POA: Diagnosis not present

## 2021-04-11 DIAGNOSIS — Z9889 Other specified postprocedural states: Secondary | ICD-10-CM | POA: Diagnosis not present

## 2021-04-11 LAB — POCT INR: INR: 1.6 — AB (ref 2.0–3.0)

## 2021-04-11 MED ORDER — ENOXAPARIN SODIUM 80 MG/0.8ML IJ SOSY: 80.0000 mg | PREFILLED_SYRINGE | Freq: Two times a day (BID) | INTRAMUSCULAR | 1 refills | Status: DC

## 2021-04-11 NOTE — Patient Instructions (Signed)
TAKE 1.5 TABLETS TONIGHT ONLY and then increase to 1 tablet daily, except 0.5 tablet on Monday and  Friday. Repeat INR in 2 weeks.   Colonoscopy 2/16   2/10: Last dose of warfarin.  2/11: No warfarin or enoxaparin (Lovenox).  2/12: Inject enoxaparin 80 mg  in the fatty abdominal tissue at least 2 inches from the belly button twice a day about 12 hours apart, 8am and 8pm rotate sites. No warfarin.  2/13: Inject enoxaparin in the fatty tissue every 12 hours, 8am and 8pm. No warfarin.  2/14: Inject enoxaparin in the fatty tissue every 12 hours, 8am and 8pm. No warfarin.  2/15: Inject enoxaparin in the fatty tissue in the morning at 8 am (No PM dose). No warfarin.  2/16: Procedure Day - No enoxaparin - Resume warfarin in the evening or as directed by doctor (take an extra half tablet with usual dose for 2 days then resume normal dose).  2/17: Resume enoxaparin inject in the fatty tissue every 12 hours and take warfarin  2/18: Inject enoxaparin in the fatty tissue every 12 hours and take warfarin  2/19: Inject enoxaparin in the fatty tissue every 12 hours and take warfarin  2/20: Inject enoxaparin in the fatty tissue every 12 hours and take warfarin  2/21: Inject enoxaparin in the fatty tissue every 12 hours and take warfarin  2/22: warfarin appt to check INR.

## 2021-04-13 ENCOUNTER — Encounter (HOSPITAL_COMMUNITY): Payer: Self-pay | Admitting: Gastroenterology

## 2021-04-18 DIAGNOSIS — Z20822 Contact with and (suspected) exposure to covid-19: Secondary | ICD-10-CM | POA: Diagnosis not present

## 2021-04-21 ENCOUNTER — Encounter (HOSPITAL_COMMUNITY)
Admission: RE | Disposition: A | Discharge status: Home / Self Care | Payer: Self-pay | Source: Home / Self Care | Attending: Gastroenterology

## 2021-04-21 ENCOUNTER — Ambulatory Visit (HOSPITAL_COMMUNITY)
Admission: RE | Admit: 2021-04-21 | Discharge: 2021-04-21 | Disposition: A | Discharge status: Home / Self Care | Payer: Medicare Other | Attending: Gastroenterology | Admitting: Gastroenterology

## 2021-04-21 ENCOUNTER — Other Ambulatory Visit: Payer: Self-pay

## 2021-04-21 ENCOUNTER — Encounter (HOSPITAL_COMMUNITY): Payer: Self-pay | Admitting: Gastroenterology

## 2021-04-21 ENCOUNTER — Ambulatory Visit (HOSPITAL_BASED_OUTPATIENT_CLINIC_OR_DEPARTMENT_OTHER): Payer: Medicare Other | Admitting: Anesthesiology

## 2021-04-21 ENCOUNTER — Ambulatory Visit (HOSPITAL_COMMUNITY): Payer: Medicare Other | Admitting: Anesthesiology

## 2021-04-21 DIAGNOSIS — I110 Hypertensive heart disease with heart failure: Secondary | ICD-10-CM | POA: Insufficient documentation

## 2021-04-21 DIAGNOSIS — K621 Rectal polyp: Secondary | ICD-10-CM

## 2021-04-21 DIAGNOSIS — K635 Polyp of colon: Secondary | ICD-10-CM | POA: Diagnosis not present

## 2021-04-21 DIAGNOSIS — F419 Anxiety disorder, unspecified: Secondary | ICD-10-CM | POA: Diagnosis not present

## 2021-04-21 DIAGNOSIS — Z86010 Personal history of colonic polyps: Secondary | ICD-10-CM

## 2021-04-21 DIAGNOSIS — K649 Unspecified hemorrhoids: Secondary | ICD-10-CM | POA: Diagnosis not present

## 2021-04-21 DIAGNOSIS — Z7901 Long term (current) use of anticoagulants: Secondary | ICD-10-CM | POA: Diagnosis not present

## 2021-04-21 DIAGNOSIS — I509 Heart failure, unspecified: Secondary | ICD-10-CM | POA: Insufficient documentation

## 2021-04-21 DIAGNOSIS — Z952 Presence of prosthetic heart valve: Secondary | ICD-10-CM | POA: Insufficient documentation

## 2021-04-21 DIAGNOSIS — Z09 Encounter for follow-up examination after completed treatment for conditions other than malignant neoplasm: Secondary | ICD-10-CM | POA: Diagnosis present

## 2021-04-21 DIAGNOSIS — Z79899 Other long term (current) drug therapy: Secondary | ICD-10-CM | POA: Insufficient documentation

## 2021-04-21 DIAGNOSIS — K641 Second degree hemorrhoids: Secondary | ICD-10-CM

## 2021-04-21 DIAGNOSIS — D120 Benign neoplasm of cecum: Secondary | ICD-10-CM | POA: Insufficient documentation

## 2021-04-21 DIAGNOSIS — K644 Residual hemorrhoidal skin tags: Secondary | ICD-10-CM | POA: Insufficient documentation

## 2021-04-21 DIAGNOSIS — D128 Benign neoplasm of rectum: Secondary | ICD-10-CM | POA: Diagnosis not present

## 2021-04-21 DIAGNOSIS — J45909 Unspecified asthma, uncomplicated: Secondary | ICD-10-CM | POA: Diagnosis not present

## 2021-04-21 DIAGNOSIS — E785 Hyperlipidemia, unspecified: Secondary | ICD-10-CM | POA: Diagnosis not present

## 2021-04-21 DIAGNOSIS — D126 Benign neoplasm of colon, unspecified: Secondary | ICD-10-CM

## 2021-04-21 HISTORY — PX: COLONOSCOPY WITH PROPOFOL: SHX5780

## 2021-04-21 HISTORY — PX: POLYPECTOMY: SHX5525

## 2021-04-21 SURGERY — COLONOSCOPY WITH PROPOFOL
Anesthesia: Monitor Anesthesia Care

## 2021-04-21 MED ORDER — SODIUM CHLORIDE 0.9 % IV SOLN: INTRAVENOUS | Status: DC

## 2021-04-21 MED ORDER — PROPOFOL 500 MG/50ML IV EMUL
INTRAVENOUS | Status: DC | PRN
Start: 2021-04-21 — End: 2021-04-21
  Administered 2021-04-21: 100 ug/kg/min via INTRAVENOUS

## 2021-04-21 MED ORDER — LACTATED RINGERS IV SOLN: INTRAVENOUS | Status: DC

## 2021-04-21 MED ORDER — PROPOFOL 10 MG/ML IV BOLUS
INTRAVENOUS | Status: DC | PRN
  Administered 2021-04-21: 30 mg via INTRAVENOUS
  Administered 2021-04-21: 20 mg via INTRAVENOUS

## 2021-04-21 SURGICAL SUPPLY — 22 items

## 2021-04-21 NOTE — Anesthesia Preprocedure Evaluation (Addendum)
Anesthesia Evaluation  Patient identified by MRN, date of birth, ID band Patient awake    Reviewed: Allergy & Precautions, NPO status , Patient's Chart, lab work & pertinent test results  Airway Mallampati: III  TM Distance: >3 FB Neck ROM: Full    Dental  (+) Chipped, Poor Dentition, Dental Advisory Given,    Pulmonary asthma ,    Pulmonary exam normal breath sounds clear to auscultation       Cardiovascular hypertension, Pt. on medications and Pt. on home beta blockers +CHF  Normal cardiovascular exam+ Valvular Problems/Murmurs MR  Rhythm:Regular Rate:Normal  Hx/o MVR 1998   Neuro/Psych Anxiety negative neurological ROS     GI/Hepatic Neg liver ROS, Hx/o adenomatous colon polyps   Endo/Other  Hyperlipidemia  Renal/GU negative Renal ROS  negative genitourinary   Musculoskeletal   Abdominal   Peds  Hematology  (+) Blood dyscrasia, anemia , Chronic anticoagulation- Lovenox last dose 92m yesterday am He has been off Coumadin for over 1 week   Anesthesia Other Findings   Reproductive/Obstetrics                           Anesthesia Physical Anesthesia Plan  ASA: 3  Anesthesia Plan: MAC   Post-op Pain Management:    Induction: Intravenous  PONV Risk Score and Plan: 1 and Propofol infusion and Treatment may vary due to age or medical condition  Airway Management Planned: Natural Airway and Simple Face Mask  Additional Equipment:   Intra-op Plan:   Post-operative Plan:   Informed Consent: I have reviewed the patients History and Physical, chart, labs and discussed the procedure including the risks, benefits and alternatives for the proposed anesthesia with the patient or authorized representative who has indicated his/her understanding and acceptance.     Dental advisory given  Plan Discussed with: CRNA and Anesthesiologist  Anesthesia Plan Comments:        Anesthesia  Quick Evaluation

## 2021-04-21 NOTE — Anesthesia Procedure Notes (Addendum)
Procedure Name: MAC Date/Time: 04/21/2021 10:50 AM Performed by: Jenne Campus, CRNA Pre-anesthesia Checklist: Patient identified, Emergency Drugs available, Suction available and Patient being monitored Oxygen Delivery Method: Simple face mask

## 2021-04-21 NOTE — Transfer of Care (Signed)
Immediate Anesthesia Transfer of Care Note  Patient: Corey Mathis  Procedure(s) Performed: COLONOSCOPY WITH PROPOFOL POLYPECTOMY  Patient Location: PACU  Anesthesia Type:MAC  Level of Consciousness: oriented, sedated and patient cooperative  Airway & Oxygen Therapy: Patient Spontanous Breathing and Patient connected to face mask oxygen  Post-op Assessment: Report given to RN and Post -op Vital signs reviewed and stable  Post vital signs: Reviewed  Last Vitals:  Vitals Value Taken Time  BP 132/79 04/21/21 1126  Temp 36.5 C 04/21/21 1126  Pulse 76 04/21/21 1132  Resp 20 04/21/21 1132  SpO2 100 % 04/21/21 1132  Vitals shown include unvalidated device data.  Last Pain:  Vitals:   04/21/21 0938  TempSrc: Temporal  PainSc: 0-No pain         Complications: No notable events documented.

## 2021-04-21 NOTE — Interval H&P Note (Signed)
History and Physical Interval Note:  04/21/2021 10:14 AM  Corey Mathis  has presented today for surgery, with the diagnosis of Hx of colon polyps.  The various methods of treatment have been discussed with the patient and family. After consideration of risks, benefits and other options for treatment, the patient has consented to  Procedure(s): COLONOSCOPY WITH PROPOFOL (N/A) as a surgical intervention.  The patient's history has been reviewed, patient examined, no change in status, stable for surgery.  I have reviewed the patient's chart and labs.  Questions were answered to the patient's satisfaction.     [employer]

## 2021-04-21 NOTE — Op Note (Signed)
[facility] Patient Name: Corey Mathis Procedure Date : 04/21/2021 MRN: [id number] Attending MD: Justice Britain , MD Date of Birth: [birth year] CSN: [id number] Age: 71 Admit Type: Outpatient Procedure:                Colonoscopy Indications:              Surveillance: Personal history of adenomatous                            polyps on last colonoscopy 3 years ago Providers:                Justice Britain, MD, Burtis Junes, RN, Cletis Athens,                            Technician Referring MD:             Beatrice Lecher Medicines:                Monitored Anesthesia Care Complications:            No immediate complications. Estimated Blood Loss:     Estimated blood loss was minimal. Procedure:                Pre-Anesthesia Assessment:                           - Prior to the procedure, a History and Physical                            was performed, and patient medications and                            allergies were reviewed. The patient's tolerance of                            previous anesthesia was also reviewed. The risks                            and benefits of the procedure and the sedation                            options and risks were discussed with the patient.                            All questions were answered, and informed consent                            was obtained. Prior Anticoagulants: The patient                            last took Coumadin (warfarin) 6 days and Lovenox                            (enoxaparin) 1 day prior to the procedure. ASA  Grade Assessment: III - A patient with severe                            systemic disease. After reviewing the risks and                            benefits, the patient was deemed in satisfactory                            condition to undergo the procedure.                           After obtaining informed consent, the colonoscope                            was  passed under direct vision. Throughout the                            procedure, the patient's blood pressure, pulse, and                            oxygen saturations were monitored continuously. The                            CF-HQ190L ([id number]) Olympus colonoscope was                            introduced through the anus and advanced to the the                            cecum, identified by appendiceal orifice and                            ileocecal valve. The colonoscopy was performed                            without difficulty. The patient tolerated the                            procedure. The quality of the bowel preparation was                            good. The ileocecal valve, appendiceal orifice, and                            rectum were photographed. Scope In: 10:49:45 AM Scope Out: 11:09:46 AM Scope Withdrawal Time: 0 hours 14 minutes 44 seconds  Total Procedure Duration: 0 hours 20 minutes 1 second  Findings:      The digital rectal exam findings include hemorrhoids. Pertinent       negatives include no palpable rectal lesions.      Two sessile polyps were found in the rectum and cecum. The polyps were 3       to 8 mm in size. These polyps were removed with a cold snare.  Resection       and retrieval were complete.      A tattoo was seen in the descending colon. The tattoo site appeared       normal.      Normal mucosa was found in the entire colon otherwise.      Non-bleeding non-thrombosed external and internal hemorrhoids were found       during retroflexion, during perianal exam and during digital exam. The       hemorrhoids were Grade II (internal hemorrhoids that prolapse but reduce       spontaneously). Impression:               - Hemorrhoids found on digital rectal exam.                           - Two 3 to 8 mm polyps in the rectum and in the                            cecum, removed with a cold snare. Resected and                            retrieved.                            - A tattoo was seen in the descending colon. The                            tattoo site appeared normal.                           - Normal mucosa in the entire examined colon                            otherwise.                           - Non-bleeding non-thrombosed external and internal                            hemorrhoids. Recommendation:           - The patient will be observed post-procedure,                            until all discharge criteria are met.                           - Discharge patient to home.                           - Patient has a contact number available for                            emergencies. The signs and symptoms of potential                            delayed complications were discussed with the  patient. Return to normal activities tomorrow.                            Written discharge instructions were provided to the                            patient.                           - High fiber diet.                           - Use FiberCon 1-2 tablets PO daily.                           - May restart Lovenox tonight.                           - May restart Coumadin tonight.                           - Continue present medications otherwise.                           - Monitor for signs/symptoms of bleeding,                            perforation, and infection. If issues please call                            our number to get further assistance as needed.                           - Await pathology results.                           - Repeat colonoscopy in 5/7 years for surveillance                            based on pathology results and person history of                            colon polyps (he will be over the age of 74, so we                            would need to see in clinic and discuss if we                            continue screening/surveillance at that time).                            - The findings and recommendations were discussed                            with the patient.                           -  The findings and recommendations were discussed                            with the designated responsible adult. Procedure Code(s):        --- Professional ---                           [phone], Colonoscopy, flexible; with removal of                            tumor(s), polyp(s), or other lesion(s) by snare                            technique Diagnosis Code(s):        --- Professional ---                           Z86.010, Personal history of colonic polyps                           K64.1, Second degree hemorrhoids                           K62.1, Rectal polyp                           K63.5, Polyp of colon CPT copyright 2019 [employer]. All rights reserved. The codes documented in this report are preliminary and upon coder review may  be revised to meet current compliance requirements. Justice Britain, MD 04/21/2021 11:22:11 AM Number of Addenda: 0

## 2021-04-21 NOTE — Anesthesia Postprocedure Evaluation (Signed)
Anesthesia Post Note  Patient: Corey Mathis  Procedure(s) Performed: COLONOSCOPY WITH PROPOFOL POLYPECTOMY     Patient location during evaluation: PACU Anesthesia Type: MAC Level of consciousness: awake and alert Pain management: pain level controlled Vital Signs Assessment: post-procedure vital signs reviewed and stable Respiratory status: spontaneous breathing, nonlabored ventilation and respiratory function stable Cardiovascular status: stable and blood pressure returned to baseline Postop Assessment: no apparent nausea or vomiting Anesthetic complications: no   No notable events documented.  Last Vitals:  Vitals:   04/21/21 1142 04/21/21 1152  BP: (!) 145/96 (!) 151/90  Pulse: 70 68  Resp: 15 15  Temp:  36.5 C  SpO2: 97% 98%    Last Pain:  Vitals:   04/21/21 1152  TempSrc:   PainSc: 0-No pain                 Riddick Nuon A.

## 2021-04-22 LAB — SURGICAL PATHOLOGY

## 2021-04-25 ENCOUNTER — Encounter: Payer: Self-pay | Admitting: Gastroenterology

## 2021-04-27 ENCOUNTER — Ambulatory Visit (INDEPENDENT_AMBULATORY_CARE_PROVIDER_SITE_OTHER): Payer: Medicare Other

## 2021-04-27 ENCOUNTER — Other Ambulatory Visit: Payer: Self-pay

## 2021-04-27 DIAGNOSIS — Z7901 Long term (current) use of anticoagulants: Secondary | ICD-10-CM | POA: Diagnosis not present

## 2021-04-27 DIAGNOSIS — Z9889 Other specified postprocedural states: Secondary | ICD-10-CM | POA: Diagnosis not present

## 2021-04-27 LAB — POCT INR: INR: 1.3 — AB (ref 2.0–3.0)

## 2021-04-27 NOTE — Patient Instructions (Signed)
TAKE 2 TABLETS TONIGHT and Thursday ONLY and then continue 1 tablet daily, except 0.5 tablet on Monday and  Friday. Repeat INR in 2 weeks.

## 2021-05-11 ENCOUNTER — Ambulatory Visit (INDEPENDENT_AMBULATORY_CARE_PROVIDER_SITE_OTHER): Payer: Medicare Other

## 2021-05-11 ENCOUNTER — Other Ambulatory Visit: Payer: Self-pay

## 2021-05-11 DIAGNOSIS — Z7901 Long term (current) use of anticoagulants: Secondary | ICD-10-CM | POA: Diagnosis not present

## 2021-05-11 DIAGNOSIS — Z9889 Other specified postprocedural states: Secondary | ICD-10-CM

## 2021-05-11 LAB — POCT INR: INR: 2.4 (ref 2.0–3.0)

## 2021-05-11 NOTE — Patient Instructions (Signed)
TAKE 1.5 TABLETS TONIGHT and then continue 1 tablet daily, except 0.5 tablet on Monday and  Friday. Repeat INR in 4 weeks.   ?

## 2021-05-23 ENCOUNTER — Other Ambulatory Visit: Payer: Self-pay | Admitting: Cardiology

## 2021-05-23 DIAGNOSIS — E7800 Pure hypercholesterolemia, unspecified: Secondary | ICD-10-CM

## 2021-05-27 DIAGNOSIS — Z20822 Contact with and (suspected) exposure to covid-19: Secondary | ICD-10-CM | POA: Diagnosis not present

## 2021-06-08 ENCOUNTER — Ambulatory Visit (INDEPENDENT_AMBULATORY_CARE_PROVIDER_SITE_OTHER): Payer: Medicare Other

## 2021-06-08 DIAGNOSIS — Z9889 Other specified postprocedural states: Secondary | ICD-10-CM | POA: Diagnosis not present

## 2021-06-08 DIAGNOSIS — Z7901 Long term (current) use of anticoagulants: Secondary | ICD-10-CM | POA: Diagnosis not present

## 2021-06-08 LAB — POCT INR: INR: 1.4 — AB (ref 2.0–3.0)

## 2021-06-08 NOTE — Patient Instructions (Signed)
TAKE 2 TABLETS TONIGHT and then continue 1 tablet daily, except 0.5 tablet on Monday and  Friday. Repeat INR in 4 weeks.   ?

## 2021-06-22 ENCOUNTER — Ambulatory Visit
Admission: EM | Admit: 2021-06-22 | Discharge: 2021-06-22 | Disposition: A | Discharge status: Home / Self Care | Payer: Medicare Other | Attending: Family Medicine | Admitting: Family Medicine

## 2021-06-22 ENCOUNTER — Other Ambulatory Visit: Payer: Self-pay | Admitting: Family Medicine

## 2021-06-22 ENCOUNTER — Ambulatory Visit (INDEPENDENT_AMBULATORY_CARE_PROVIDER_SITE_OTHER): Payer: Medicare Other

## 2021-06-22 ENCOUNTER — Other Ambulatory Visit: Payer: Self-pay | Admitting: Cardiology

## 2021-06-22 DIAGNOSIS — R0602 Shortness of breath: Secondary | ICD-10-CM | POA: Diagnosis not present

## 2021-06-22 DIAGNOSIS — J4521 Mild intermittent asthma with (acute) exacerbation: Secondary | ICD-10-CM

## 2021-06-22 DIAGNOSIS — R062 Wheezing: Secondary | ICD-10-CM | POA: Diagnosis not present

## 2021-06-22 DIAGNOSIS — J0140 Acute pansinusitis, unspecified: Secondary | ICD-10-CM

## 2021-06-22 DIAGNOSIS — I428 Other cardiomyopathies: Secondary | ICD-10-CM

## 2021-06-22 DIAGNOSIS — N2889 Other specified disorders of kidney and ureter: Secondary | ICD-10-CM

## 2021-06-22 DIAGNOSIS — Z20822 Contact with and (suspected) exposure to covid-19: Secondary | ICD-10-CM | POA: Diagnosis not present

## 2021-06-22 DIAGNOSIS — Z20828 Contact with and (suspected) exposure to other viral communicable diseases: Secondary | ICD-10-CM | POA: Diagnosis not present

## 2021-06-22 MED ORDER — IPRATROPIUM-ALBUTEROL 0.5-2.5 (3) MG/3ML IN SOLN
3.0000 mL | Freq: Once | RESPIRATORY_TRACT | Status: AC
  Administered 2021-06-22: 3 mL via RESPIRATORY_TRACT

## 2021-06-22 MED ORDER — PREDNISONE 20 MG PO TABS: 40.0000 mg | ORAL_TABLET | Freq: Every day | ORAL | 0 refills | Status: DC

## 2021-06-22 MED ORDER — AMOXICILLIN-POT CLAVULANATE 875-125 MG PO TABS: 1.0000 | ORAL_TABLET | Freq: Two times a day (BID) | ORAL | 0 refills | Status: DC

## 2021-06-22 MED ORDER — DEXAMETHASONE SODIUM PHOSPHATE 10 MG/ML IJ SOLN
10.0000 mg | Freq: Once | INTRAMUSCULAR | Status: AC
  Administered 2021-06-22: 10 mg via INTRAMUSCULAR

## 2021-06-22 NOTE — ED Triage Notes (Signed)
Pt c/o wheezing, feeling like something is stuck in throat, post nasal drip, onset ~ 2 weeks ago. Pt spo2 was 30 originally. Increased to 95 after several deep breaths. States has hx of asthma. Pt states last advair inhalation was this morning, albuterol was used last night with mild relief.  ?

## 2021-06-22 NOTE — ED Provider Notes (Signed)
[facility]    CSN: [id number] Arrival date & time: 06/22/21  1243      History   Chief Complaint Chief Complaint  Patient presents with   Wheezing    HPI Corey Mathis is a 71 y.o. male.   HPI Patient presents today for evaluation of persistent wheezing.  Patient has a history of acute asthma and allergy and reports over the last 2 weeks he has had some upper respiratory symptoms including persistent drainage and nasal congestion.  He has been taking his home medications along with using his maintenance inhaler without relief.  He reports over the last 2 days his symptoms have significantly worsened and has been unable to cough up mucus that seems to be lodged in his throat.  On arrival his initial SPO2 was 91 however with coughing his oxygen level improved to 95%.  He is actively wheezing. Endorses mild shortness of breath with exertion.  Past Medical History:  Diagnosis Date   Allergy    Anxiety    Asthma    CHF (congestive heart failure) (HCC)    Dyslipidemia    History of mitral valve replacement    Hypertension    Labyrinthitis    hx    Patient Active Problem List   Diagnosis Date Noted   Hx of adenomatous colonic polyps 03/26/2021   Mass of breast, left 11/15/2018   Iron deficiency anemia 03/19/2018   Tubular adenoma of colon 03/19/2018   Colonoscopy causing post-procedural bleeding 03/19/2018   Anticoagulated on Coumadin 03/19/2018   Status post colonoscopy with polypectomy 02/11/2018   Hematochezia    History of colonic polyps 01/17/2018   Family history of colon cancer 01/17/2018   Left knee pain 10/09/2017   Hemothorax    H/O mitral valve replacement with mechanical valve    NICM (nonischemic cardiomyopathy) (HCC)    Bruit 01/08/2014   IFG (impaired fasting glucose) 09/23/2013   Lung nodule 03/31/2013   BPH (benign prostatic hyperplasia) 09/27/2012   Lipoma of back 04/26/2011   Chronic anticoagulation 01/25/2011   WEIGHT LOSS  02/11/2010   Hyperlipidemia 06/03/2007   Anxiety state 06/03/2007   Essential hypertension 06/03/2007   Asthma 06/03/2007   ALLERGY 06/03/2007   LABYRINTHITIS, HX OF 06/03/2007   MITRAL VALVE REPLACEMENT, HX OF 06/03/2007    Past Surgical History:  Procedure Laterality Date   ACNE CYST REMOVAL     from hand and back   COLONOSCOPY WITH PROPOFOL N/A 04/21/2021   Procedure: COLONOSCOPY WITH PROPOFOL;  Surgeon: Lemar Lofty., MD;  Location: [facility] ENDOSCOPY;  Service: Gastroenterology;  Laterality: N/A;   EXTERNAL FIXATION LEG Right 08/11/2017   Procedure: EXTERNAL FIXATION, RIGHT KNEE;  Surgeon: Tarry Kos, MD;  Location: [facility] OR;  Service: Orthopedics;  Laterality: Right;   FINE NEEDLE ASPIRATION Right 08/11/2017   Procedure: FINE NEEDLE ASPIRATION  of right Knee with 80 cc of dark red fluid removed;  Surgeon: Tarry Kos, MD;  Location: [facility] OR;  Service: Orthopedics;  Laterality: Right;   FLEXIBLE SIGMOIDOSCOPY N/A 02/11/2018   Procedure: FLEXIBLE SIGMOIDOSCOPY;  Surgeon: Sherrilyn Rist, MD;  Location: [facility] ENDOSCOPY;  Service: Gastroenterology;  Laterality: N/A;   Jaw surgery (other)     NASAL SINUS SURGERY N/A 10/16/2012   Procedure: NASAL ENDOSCOPIC POLYPECTOMY/MAXILLARY ANTROSTOMY/ETHMOIDECTOMY;  Surgeon: Serena Colonel, MD;  Location: [facility] OR;  Service: ENT;  Laterality: N/A;   ORIF TIBIA PLATEAU Left 08/13/2017   Procedure: OPEN REDUCTION INTERNAL FIXATION (ORIF) TIBIAL PLATEAU;  Surgeon:  Tarry Kos, MD;  Location: [facility] OR;  Service: Orthopedics;  Laterality: Left;   ORIF TIBIA PLATEAU Right 08/22/2017   Procedure: OPEN REDUCTION INTERNAL FIXATION (ORIF) RIGHT BICONDYLAR TIBIAL PLATEAU, REMOVAL OF EX FIX;  Surgeon: Tarry Kos, MD;  Location: [facility] OR;  Service: Orthopedics;  Laterality: Right;   POLYPECTOMY  04/21/2021   Procedure: POLYPECTOMY;  Surgeon: Mansouraty, Netty Starring., MD;  Location: [facility] ENDOSCOPY;  Service: Gastroenterology;;   RIGHT/LEFT HEART CATH AND CORONARY ANGIOGRAPHY  N/A 07/12/2017   Procedure: RIGHT/LEFT HEART CATH AND CORONARY ANGIOGRAPHY;  Surgeon: Kathleene Hazel, MD;  Location: [facility] INVASIVE CV LAB;  Service: Cardiovascular;  Laterality: N/A;   SUBMUCOSAL INJECTION  02/11/2018   Procedure: SUBMUCOSAL INJECTION;  Surgeon: Sherrilyn Rist, MD;  Location: [facility] ENDOSCOPY;  Service: Gastroenterology;;   TOOTH EXTRACTION     VALVE REPLACEMENT  1998   [employer], mitral       Home Medications    Prior to Admission medications   Medication Sig Start Date End Date Taking? Authorizing Provider  amoxicillin-clavulanate (AUGMENTIN) 875-125 MG tablet Take 1 tablet by mouth 2 (two) times daily. 06/22/21  Yes Bing Neighbors, FNP  predniSONE (DELTASONE) 20 MG tablet Take 2 tablets (40 mg total) by mouth daily with breakfast. 06/22/21  Yes Bing Neighbors, FNP  albuterol (PROAIR HFA) 108 (90 Base) MCG/ACT inhaler Inhale 2 puffs into the lungs every 6 (six) hours as needed for wheezing. 12/06/16 09/07/24  Agapito Games, MD  antiseptic oral rinse (BIOTENE) LIQD 5 mLs by Mouth Rinse route as needed for dry mouth or mouth pain.    [provider]  aspirin 81 MG EC tablet Take 81 mg by mouth at bedtime.    [provider]  atorvastatin (LIPITOR) 40 MG tablet Take 1 tablet by mouth once daily 05/24/21   Lewayne Bunting, MD  cholecalciferol (VITAMIN D) 1000 units tablet Take 1,000 Units by mouth once a week.    [provider]  COENZYME Q10 PO Take 1 capsule by mouth once a week.    [provider]  enoxaparin (LOVENOX) 80 MG/0.8ML injection Inject 0.8 mLs (80 mg total) into the skin every 12 (twelve) hours. 04/11/21   Lewayne Bunting, MD  fluticasone (FLONASE) 50 MCG/ACT nasal spray Place 1 spray into both nostrils daily as needed (congestion). 07/25/19   Hall-Potvin, [place], PA-C  fluticasone-salmeterol (ADVAIR) 250-50 MCG/ACT AEPB INHALE ONE PUFF BY MOUTH TWICE A DAY 10/21/20   Agapito Games, MD  hydrALAZINE  (APRESOLINE) 50 MG tablet Take 1.5 tablets (75 mg total) by mouth 3 (three) times daily. 11/23/20   Lewayne Bunting, MD  isosorbide mononitrate (IMDUR) 60 MG 24 hr tablet Take 1 tablet (60 mg total) by mouth daily. 05/18/20   Lewayne Bunting, MD  Menthol, Topical Analgesic, (BIOFREEZE EX) Apply 1 application topically daily as needed (Pain). Gel    [provider]  Menthol, Topical Analgesic, (PERFORM PAIN RELIEVING ROLL-ON) 3.1 % LIQD Apply 1 application topically daily as needed (Pain).    [provider]  metoprolol succinate (TOPROL-XL) 100 MG 24 hr tablet Take 1 tablet by mouth once daily 05/24/21   Lewayne Bunting, MD  metoprolol succinate (TOPROL-XL) 50 MG 24 hr tablet TAKE 1 TABLET BY MOUTH ONCE DAILY AT BEDTIME WITH A 100 MG TABLET FOR A TOTAL OF 150MG  10/05/20   Lewayne Bunting, MD  Multiple Vitamin (MULTIVITAMIN WITH MINERALS) TABS tablet Take 1 tablet by mouth at  bedtime.    [provider]  PARoxetine (PAXIL) 30 MG tablet Take 1 tablet (30 mg total) by mouth daily. 02/21/21   Agapito Games, MD  Probiotic Product (PROBIOTIC PO) Take 1 tablet by mouth once a week.    [provider]  vitamin C (ASCORBIC ACID) 500 MG tablet Take 500 mg by mouth once a week.    [provider]  warfarin (COUMADIN) 10 MG tablet Take 1/2 a tablet to 1 tablet by mouth daily as directed by the coumadin clinic. Patient taking differently: Take 5-10 mg by mouth See admin instructions. Take 10 mg every day EXCEPT: Monday and Friday 5 mg in the evening or as directed by the coumadin clinic. 03/15/21   Lewayne Bunting, MD    Family History Family History  Problem Relation Age of Onset   Heart attack Father 42   Colon cancer Mother 58   Hypertension Mother    Breast cancer Maternal Aunt    Stomach cancer Neg Hx    Esophageal cancer Neg Hx    Inflammatory bowel disease Neg Hx    Liver disease Neg Hx    Pancreatic cancer Neg Hx     Social  History Social History   Tobacco Use   Smoking status: Never   Smokeless tobacco: Never  Vaping Use   Vaping Use: Never used  Substance Use Topics   Alcohol use: Never   Drug use: Never     Allergies   Lisinopril, Carvedilol, Ezetimibe-simvastatin, Propranolol hcl, Ramipril, and Codeine   Review of Systems Review of Systems Pertinent negatives listed in HPI   Physical Exam Triage Vital Signs ED Triage Vitals  Enc Vitals Group     BP 06/22/21 1322 (!) 169/86     Pulse Rate 06/22/21 1322 68     Resp 06/22/21 1322 18     Temp 06/22/21 1322 97.8 F (36.6 C)     Temp Source 06/22/21 1322 Oral     SpO2 06/22/21 1322 95 %     Weight --      Height --      Head Circumference --      Peak Flow --      Pain Score 06/22/21 1323 0     Pain Loc --      Pain Edu? --      Excl. in GC? --    No data found.  Updated Vital Signs BP (!) 169/86 (BP Location: Left Arm)   Pulse 68   Temp 97.8 F (36.6 C) (Oral)   Resp 18   SpO2 95%   Visual Acuity Right Eye Distance:   Left Eye Distance:   Bilateral Distance:    Right Eye Near:   Left Eye Near:    Bilateral Near:     Physical Exam Constitutional:      General: He is not in acute distress.    Appearance: Normal appearance. He is not toxic-appearing.  HENT:     Head: Normocephalic and atraumatic.     Nose: Nose normal.  Eyes:     Extraocular Movements: Extraocular movements intact.     Pupils: Pupils are equal, round, and reactive to light.  Cardiovascular:     Rate and Rhythm: Normal rate and regular rhythm.  Pulmonary:     Effort: Pulmonary effort is normal.     Breath sounds: Wheezing and rhonchi present. No rales.  Chest:     Chest wall: No tenderness.  Musculoskeletal:  General: Normal range of motion.  Skin:    General: Skin is warm and dry.  Neurological:     General: No focal deficit present.     Mental Status: He is alert.  Psychiatric:        Mood and Affect: Mood normal.        Behavior:  Behavior normal.      UC Treatments / Results  Labs (all labs ordered are listed, but only abnormal results are displayed) Labs Reviewed - No data to display  EKG   Radiology DG Chest 2 View  Result Date: 06/22/2021 CLINICAL DATA:  Shortness of breath, wheezing EXAM: CHEST - 2 VIEW COMPARISON:  07/25/2019 FINDINGS: Cardiac size is within normal limits. Prosthetic aortic valve is seen. Lung fields are clear of any infiltrates or pulmonary edema. There is no pleural effusion or pneumothorax. IMPRESSION: No active cardiopulmonary disease. Electronically Signed   By: Ernie Avena M.D.   On: 06/22/2021 14:32    Procedures Procedures (including critical care time)  Medications Ordered in UC Medications  ipratropium-albuterol (DUONEB) 0.5-2.5 (3) MG/3ML nebulizer solution 3 mL (3 mLs Nebulization Given 06/22/21 1401)  dexamethasone (DECADRON) injection 10 mg (10 mg Intramuscular Given 06/22/21 1401)    Initial Impression / Assessment and Plan / UC Course  I have reviewed the triage vital signs and the nursing notes.  Pertinent labs & imaging results that were available during my care of the patient were reviewed by me and considered in my medical decision making (see chart for details).    Chest x-ray negative.  Patient received a DuoNeb here in clinic and reports significant relief of her work of breathing.  His wheezing has significantly diminished.  Treatment per discharge medication orders. Patient given strict return precautions if his symptoms worsen or do not improve with treatment.  Patient verbalized understanding and agreement with plan. Final Clinical Impressions(s) / UC Diagnoses   Final diagnoses:  Intermittent asthma with acute exacerbation, unspecified asthma severity  Acute non-recurrent pansinusitis   Discharge Instructions   None    ED Prescriptions     Medication Sig Dispense Auth. Provider   predniSONE (DELTASONE) 20 MG tablet Take 2 tablets (40 mg  total) by mouth daily with breakfast. 10 tablet Bing Neighbors, FNP   amoxicillin-clavulanate (AUGMENTIN) 875-125 MG tablet Take 1 tablet by mouth 2 (two) times daily. 20 tablet Bing Neighbors, FNP      PDMP not reviewed this encounter.   Bing Neighbors, FNP 06/23/21 1257

## 2021-06-27 ENCOUNTER — Telehealth: Payer: Self-pay | Admitting: Cardiology

## 2021-06-27 MED ORDER — METOPROLOL SUCCINATE ER 100 MG PO TB24: ORAL_TABLET | ORAL | 0 refills | Status: DC

## 2021-06-27 NOTE — Telephone Encounter (Signed)
Spoke with pt pharmacist, script changed and sent into the pharmacy. ?

## 2021-06-27 NOTE — Telephone Encounter (Signed)
New Message: ? ? ? ? ?Patient's insurance will not pay for both '100mg'$  and 50 mg of his Metoprolol. She would like for 1 prescription be written  for Metoprolol 100 ?mg and take 1 1/2 daily. ? ? ? ? ?*STAT* If patient is at the pharmacy, call can be transferred to refill team. ? ? ?1. Which medications need to be refilled? (please list name of each medication and dose if known) Metoprolol 100 mg 1 1/  2r ? ?2. Which pharmacy/location (including street and city if local pharmacy) is medication to be sent to? [facility] [address], [place] ? ?3. Do they need a 30 day or 90 day supply? 90 days and refills ? ?

## 2021-07-05 DIAGNOSIS — Z20822 Contact with and (suspected) exposure to covid-19: Secondary | ICD-10-CM | POA: Diagnosis not present

## 2021-07-07 ENCOUNTER — Ambulatory Visit (INDEPENDENT_AMBULATORY_CARE_PROVIDER_SITE_OTHER): Payer: Medicare Other

## 2021-07-07 DIAGNOSIS — Z7901 Long term (current) use of anticoagulants: Secondary | ICD-10-CM

## 2021-07-07 DIAGNOSIS — Z9889 Other specified postprocedural states: Secondary | ICD-10-CM

## 2021-07-07 LAB — POCT INR: INR: 1.6 — AB (ref 2.0–3.0)

## 2021-07-07 NOTE — Patient Instructions (Signed)
TAKE 2 TABLETS TONIGHT and 1 tablet Friday and then continue 1 tablet daily, except 0.5 tablet on Monday and  Friday. Repeat INR in 3 weeks.   ?

## 2021-07-08 ENCOUNTER — Ambulatory Visit
Admission: RE | Admit: 2021-07-08 | Discharge: 2021-07-08 | Disposition: A | Discharge status: Home / Self Care | Payer: Medicare Other | Source: Ambulatory Visit | Attending: Family Medicine | Admitting: Family Medicine

## 2021-07-08 DIAGNOSIS — K7689 Other specified diseases of liver: Secondary | ICD-10-CM | POA: Diagnosis not present

## 2021-07-08 DIAGNOSIS — N2889 Other specified disorders of kidney and ureter: Secondary | ICD-10-CM

## 2021-07-08 MED ORDER — GADOBENATE DIMEGLUMINE 529 MG/ML IV SOLN
18.0000 mL | Freq: Once | INTRAVENOUS | Status: AC | PRN
  Administered 2021-07-08: 18 mL via INTRAVENOUS

## 2021-07-08 NOTE — Progress Notes (Signed)
Hi Corey Mathis, great news!  They did not see a worrisome mass on the kidney which is fantastic.  They did see a tiny simple cyst at the head of the pancreas.  So they did recommend that we watch that carefully with another MRI in 1 year.  You also had a small cyst in the liver but again nothing worrisome and that does not need to be followed.  There were no abnormal lymph nodes which is also reassuring and the spleen looked good.

## 2021-07-29 ENCOUNTER — Ambulatory Visit (INDEPENDENT_AMBULATORY_CARE_PROVIDER_SITE_OTHER): Payer: Medicare Other

## 2021-07-29 DIAGNOSIS — Z7901 Long term (current) use of anticoagulants: Secondary | ICD-10-CM | POA: Diagnosis not present

## 2021-07-29 DIAGNOSIS — Z9889 Other specified postprocedural states: Secondary | ICD-10-CM | POA: Diagnosis not present

## 2021-07-29 DIAGNOSIS — Z5181 Encounter for therapeutic drug level monitoring: Secondary | ICD-10-CM | POA: Diagnosis not present

## 2021-07-29 LAB — POCT INR: INR: 2.2 (ref 2.0–3.0)

## 2021-07-29 NOTE — Patient Instructions (Signed)
INCREASE TO  1 tablet daily, except 0.5 tablet on Monday. Repeat INR in 4 weeks.

## 2021-08-10 ENCOUNTER — Other Ambulatory Visit: Payer: Self-pay | Admitting: Cardiology

## 2021-08-10 DIAGNOSIS — I428 Other cardiomyopathies: Secondary | ICD-10-CM

## 2021-08-21 ENCOUNTER — Other Ambulatory Visit: Payer: Self-pay | Admitting: Cardiology

## 2021-08-21 DIAGNOSIS — E7800 Pure hypercholesterolemia, unspecified: Secondary | ICD-10-CM

## 2021-08-22 ENCOUNTER — Ambulatory Visit: Payer: Medicare Other | Admitting: Family Medicine

## 2021-08-25 ENCOUNTER — Ambulatory Visit: Payer: Medicare Other | Admitting: Family Medicine

## 2021-08-26 ENCOUNTER — Encounter: Payer: Self-pay | Admitting: Family Medicine

## 2021-08-26 ENCOUNTER — Ambulatory Visit (INDEPENDENT_AMBULATORY_CARE_PROVIDER_SITE_OTHER): Payer: Medicare Other

## 2021-08-26 ENCOUNTER — Ambulatory Visit (INDEPENDENT_AMBULATORY_CARE_PROVIDER_SITE_OTHER): Payer: Medicare Other | Admitting: Family Medicine

## 2021-08-26 VITALS — BP 114/72 | HR 65 | Resp 16 | Ht 76.0 in | Wt 195.0 lb

## 2021-08-26 DIAGNOSIS — Z7901 Long term (current) use of anticoagulants: Secondary | ICD-10-CM | POA: Diagnosis not present

## 2021-08-26 DIAGNOSIS — Z9889 Other specified postprocedural states: Secondary | ICD-10-CM

## 2021-08-26 DIAGNOSIS — R7301 Impaired fasting glucose: Secondary | ICD-10-CM

## 2021-08-26 DIAGNOSIS — F411 Generalized anxiety disorder: Secondary | ICD-10-CM

## 2021-08-26 DIAGNOSIS — I10 Essential (primary) hypertension: Secondary | ICD-10-CM | POA: Diagnosis not present

## 2021-08-26 LAB — POCT INR: INR: 3.6 — AB (ref 2.0–3.0)

## 2021-08-26 MED ORDER — PAROXETINE HCL 30 MG PO TABS: 30.0000 mg | ORAL_TABLET | Freq: Every day | ORAL | 1 refills | Status: DC

## 2021-08-26 NOTE — Assessment & Plan Note (Signed)
We will check A1c today.  He feels like overall he is doing really well.

## 2021-08-27 LAB — BASIC METABOLIC PANEL WITH GFR
BUN: 15 mg/dL (ref 7–25)
CO2: 29 mmol/L (ref 20–32)
Calcium: 9.3 mg/dL (ref 8.6–10.3)
Chloride: 100 mmol/L (ref 98–110)
Creat: 1.09 mg/dL (ref 0.70–1.28)
Glucose, Bld: 87 mg/dL (ref 65–99)
Potassium: 3.9 mmol/L (ref 3.5–5.3)
Sodium: 135 mmol/L (ref 135–146)
eGFR: 72 mL/min/{1.73_m2} (ref >=60)

## 2021-08-27 LAB — HEMOGLOBIN A1C
Hgb A1c MFr Bld: 5.6 % of total Hgb (ref <5.7)
Mean Plasma Glucose: 114 mg/dL
eAG (mmol/L): 6.3 mmol/L

## 2021-08-30 ENCOUNTER — Telehealth: Payer: Self-pay | Admitting: *Deleted

## 2021-08-31 NOTE — Telephone Encounter (Signed)
Left message for the [facility] office to confirm if the pt has been scheduled yet for his dental procedure. Please see notes; as pt will need SBE and lovenox bridging. Pt at this time has appt with the coumadin clinic 09/23/21. If dental procedure is before this date please let our office know ASAP so that we may schedule sooner to see the pharm-d.   I will fax FYI to requesting office as well.     While putting my notes together, Rollene Fare from [facility] called back.  Per Rollene Fare the procedure has not been scheduled yet and they will await to hear from our office. I did inform her that the pt has appt 09/23/21 with the pharm-d which at that time will set up for lovenox bridging and SBE. Rollene Fare thanked me for the help.   I assured once the pt has been cleared we will fax notes over to their office, Rollene Fare said thank you.

## 2021-09-01 ENCOUNTER — Telehealth: Payer: Self-pay | Admitting: *Deleted

## 2021-09-01 NOTE — Telephone Encounter (Signed)
Pt has been scheduled for a tele-visit, 09/06/30 4:00, consent on file / medications reconciled.    Patient Consent for Virtual Visit        [name] has provided verbal consent on 09/01/2021 for a virtual visit (video or telephone).   CONSENT FOR VIRTUAL VISIT FOR:  Corey Mathis  By participating in this virtual visit I agree to the following:  I hereby voluntarily request, consent and authorize [facility] and its employed or contracted physicians, physician assistants, nurse practitioners or other licensed health care professionals (the Practitioner), to provide me with telemedicine health care services (the "Services") as deemed necessary by the treating Practitioner. I acknowledge and consent to receive the Services by the Practitioner via telemedicine. I understand that the telemedicine visit will involve communicating with the Practitioner through live audiovisual communication technology and the disclosure of certain medical information by electronic transmission. I acknowledge that I have been given the opportunity to request an in-person assessment or other available alternative prior to the telemedicine visit and am voluntarily participating in the telemedicine visit.  I understand that I have the right to withhold or withdraw my consent to the use of telemedicine in the course of my care at any time, without affecting my right to future care or treatment, and that the Practitioner or I may terminate the telemedicine visit at any time. I understand that I have the right to inspect all information obtained and/or recorded in the course of the telemedicine visit and may receive copies of available information for a reasonable fee.  I understand that some of the potential risks of receiving the Services via telemedicine include:  Delay or interruption in medical evaluation due to technological equipment failure or disruption; Information transmitted may not be sufficient (e.g.  poor resolution of images) to allow for appropriate medical decision making by the Practitioner; and/or  In rare instances, security protocols could fail, causing a breach of personal health information.  Furthermore, I acknowledge that it is my responsibility to provide information about my medical history, conditions and care that is complete and accurate to the best of my ability. I acknowledge that Practitioner's advice, recommendations, and/or decision may be based on factors not within their control, such as incomplete or inaccurate data provided by me or distortions of diagnostic images or specimens that may result from electronic transmissions. I understand that the practice of medicine is not an exact science and that Practitioner makes no warranties or guarantees regarding treatment outcomes. I acknowledge that a copy of this consent can be made available to me via my patient portal ([facility]), or I can request a printed copy by calling the office of [facility].    I understand that my insurance will be billed for this visit.   I have read or had this consent read to me. I understand the contents of this consent, which adequately explains the benefits and risks of the Services being provided via telemedicine.  I have been provided ample opportunity to ask questions regarding this consent and the Services and have had my questions answered to my satisfaction. I give my informed consent for the services to be provided through the use of telemedicine in my medical care

## 2021-09-01 NOTE — Telephone Encounter (Signed)
   Name: Corey Mathis  DOB: June 29, 1950  MRN: [id number]  Primary Cardiologist: Kirk Ruths, MD  Chart reviewed as part of pre-operative protocol coverage. Because of [name] past medical history and time since last visit, he will require a follow-up tele visit in order to better assess preoperative cardiovascular risk.  Pre-op covering staff: - Please schedule appointment and call patient to inform them. If patient already had an upcoming appointment within acceptable timeframe, please add "pre-op clearance" to the appointment notes so provider is aware. - Please contact requesting surgeon's office via preferred method (i.e, phone, fax) to inform them of need for appointment prior to surgery.  Dental extractions are planned for 09/30/2021 at 4 PM.  The patient has a Coumadin clinic appointment 09/23/2021.  The patient will require preop antibiotics for dental procedure.  The patient will also need bridging with Lovenox around the procedure.  Patient can hold Coumadin for 5 days prior to procedure.  Elgie Collard, PA-C  09/01/2021, 1:43 PM

## 2021-09-01 NOTE — Telephone Encounter (Signed)
Pt has been scheduled for a tele-visit, 09/05/21 4:00.

## 2021-09-01 NOTE — Telephone Encounter (Signed)
Dr. Eddie Dibbles office called to confirm the date and time of the procedure. It is scheduled for 09/30/21 at 4:00 pm

## 2021-09-05 ENCOUNTER — Ambulatory Visit (INDEPENDENT_AMBULATORY_CARE_PROVIDER_SITE_OTHER): Payer: Medicare Other | Admitting: Nurse Practitioner

## 2021-09-05 DIAGNOSIS — Z01810 Encounter for preprocedural cardiovascular examination: Secondary | ICD-10-CM

## 2021-09-05 DIAGNOSIS — Z952 Presence of prosthetic heart valve: Secondary | ICD-10-CM | POA: Diagnosis not present

## 2021-09-05 MED ORDER — AMOXICILLIN 500 MG PO TABS: 2000.0000 mg | ORAL_TABLET | Freq: Once | ORAL | 3 refills | Status: DC | PRN

## 2021-09-05 NOTE — Progress Notes (Signed)
Virtual Visit via Telephone Note   Because of Corey Mathis co-morbid illnesses, he is at least at moderate risk for complications without adequate follow up.  This format is felt to be most appropriate for this patient at this time.  The patient did not have access to video technology/had technical difficulties with video requiring transitioning to audio format only (telephone).  All issues noted in this document were discussed and addressed.  No physical exam could be performed with this format.  Please refer to the patient's chart for his consent to telehealth for Corey Mathis.  Evaluation Performed:  Preoperative cardiovascular risk assessment _____________   Date:  09/05/2021   Patient ID:  Corey Mathis, Corey Mathis 27-Feb-1951, MRN [id number] Patient Location:  Home Provider location:   Office  Primary Care Provider:  [name], MD Primary Cardiologist:  Kirk Ruths, MD  Chief Complaint / Patient Profile   71 y.o. y/o male with a h/o mechanical mitral valve replacement on Coumadin, CAD, NICM, aortic root dilation, hypertension, and hyperlipidemia who is pending dental extraction with Dr. Galvin Proffer, DDS on 09/30/2021 and presents today for telephonic preoperative cardiovascular risk assessment.  Past Medical History    Past Medical History:  Diagnosis Date   Allergy    Anxiety    Asthma    CHF (congestive heart failure) (HCC)    Dyslipidemia    History of mitral valve replacement    Hypertension    Labyrinthitis    hx   Past Surgical History:  Procedure Laterality Date   ACNE CYST REMOVAL     from hand and back   COLONOSCOPY WITH PROPOFOL N/A 04/21/2021   Procedure: COLONOSCOPY WITH PROPOFOL;  Surgeon: Irving Copas., MD;  Location: [facility];  Service: Gastroenterology;  Laterality: N/A;   EXTERNAL FIXATION LEG Right 08/11/2017   Procedure: EXTERNAL FIXATION, RIGHT KNEE;  Surgeon: Leandrew Koyanagi, MD;  Location: [facility];  Service:  Orthopedics;  Laterality: Right;   FINE NEEDLE ASPIRATION Right 08/11/2017   Procedure: FINE NEEDLE ASPIRATION  of right Knee with 80 cc of dark red fluid removed;  Surgeon: Leandrew Koyanagi, MD;  Location: [facility];  Service: Orthopedics;  Laterality: Right;   FLEXIBLE SIGMOIDOSCOPY N/A 02/11/2018   Procedure: FLEXIBLE SIGMOIDOSCOPY;  Surgeon: Doran Stabler, MD;  Location: [facility];  Service: Gastroenterology;  Laterality: N/A;   Jaw surgery (other)     NASAL SINUS SURGERY N/A 10/16/2012   Procedure: NASAL ENDOSCOPIC POLYPECTOMY/MAXILLARY ANTROSTOMY/ETHMOIDECTOMY;  Surgeon: Izora Gala, MD;  Location: [facility];  Service: ENT;  Laterality: N/A;   ORIF TIBIA PLATEAU Left 08/13/2017   Procedure: OPEN REDUCTION INTERNAL FIXATION (ORIF) TIBIAL PLATEAU;  Surgeon: Leandrew Koyanagi, MD;  Location: [facility];  Service: Orthopedics;  Laterality: Left;   ORIF TIBIA PLATEAU Right 08/22/2017   Procedure: OPEN REDUCTION INTERNAL FIXATION (ORIF) RIGHT BICONDYLAR TIBIAL PLATEAU, REMOVAL OF EX FIX;  Surgeon: Leandrew Koyanagi, MD;  Location: [facility];  Service: Orthopedics;  Laterality: Right;   POLYPECTOMY  04/21/2021   Procedure: POLYPECTOMY;  Surgeon: Mansouraty, Telford Nab., MD;  Location: [facility];  Service: Gastroenterology;;   RIGHT/LEFT HEART CATH AND CORONARY ANGIOGRAPHY N/A 07/12/2017   Procedure: RIGHT/LEFT HEART CATH AND CORONARY ANGIOGRAPHY;  Surgeon: Burnell Blanks, MD;  Location: [facility];  Service: Cardiovascular;  Laterality: N/A;   SUBMUCOSAL INJECTION  02/11/2018   Procedure: SUBMUCOSAL INJECTION;  Surgeon: Doran Stabler, MD;  Location: [place] [facility];  Service: Gastroenterology;;   TOOTH EXTRACTION     VALVE REPLACEMENT  1998   [facility] [facility], mitral    Allergies  Allergies  Allergen Reactions   Lisinopril Anaphylaxis and Other (See Comments)    angioedema   Carvedilol Other (See Comments)    wheezing   Ezetimibe-Simvastatin Other (See Comments)    Serious vertigo   Propranolol Hcl  Other (See Comments)    wheezing   Ramipril Other (See Comments)    wheezing   Codeine Nausea Only    History of Present Illness    Corey Mathis is a 71 y.o. male who presents via audio/video conferencing for a telehealth visit today.  Pt was last seen in cardiology clinic on 11/23/2020 by Dr. [name]. At that time MAJESTY OEHLERT was doing well from a cardiac standpoint.  The patient is now pending procedure as outlined above. Since his last visit, he has done well from a cardiac standpoint. He is active and walks at the mall for exercise every day. He denies chest pain, palpitations, dyspnea, pnd, orthopnea, n, v, dizziness, syncope, edema, weight gain, or early satiety. All other systems reviewed and are otherwise negative except as noted above.   Home Medications    Prior to Admission medications   Medication Sig Start Date End Date Taking? Authorizing Provider  albuterol (PROAIR HFA) 108 (90 Base) MCG/ACT inhaler Inhale 2 puffs into the lungs every 6 (six) hours as needed for wheezing. 12/06/16 09/07/24  [name], MD  antiseptic oral rinse (BIOTENE) LIQD 5 mLs by Mouth Rinse route as needed for dry mouth or mouth pain.    [provider]  aspirin 81 MG EC tablet Take 81 mg by mouth at bedtime.    [provider]  atorvastatin (LIPITOR) 40 MG tablet Take 1 tablet by mouth once daily 08/22/21   Lelon Perla, MD  cholecalciferol (VITAMIN D) 1000 units tablet Take 1,000 Units by mouth once a week.    [provider]  COENZYME Q10 PO Take 1 capsule by mouth once a week.    [provider]  fluticasone (FLONASE) 50 MCG/ACT nasal spray Place 1 spray into both nostrils daily as needed (congestion). 07/25/19   Hall-Potvin, [place], PA-C  fluticasone-salmeterol (ADVAIR) 250-50 MCG/ACT AEPB INHALE ONE PUFF BY MOUTH TWICE A DAY 10/21/20   [name], MD  hydrALAZINE (APRESOLINE) 50 MG tablet Take 1.5 tablets (75 mg total) by mouth 3  (three) times daily. 11/23/20   Lelon Perla, MD  isosorbide mononitrate (IMDUR) 60 MG 24 hr tablet Take 1 tablet by mouth once daily 08/11/21   Lelon Perla, MD  metoprolol succinate (TOPROL-XL) 100 MG 24 hr tablet Take 1 and 1/2 tablets daily Take with or immediately following a meal. Patient taking differently: Take 1 and 1/2 tablets daily Take with or immediately following a meal. Patient taking 150 mg total. 06/27/21   Lelon Perla, MD  Metoprolol Succinate 50 MG CS24 Take 50 mg by mouth daily.    [provider]  Multiple Vitamin (MULTIVITAMIN WITH MINERALS) TABS tablet Take 1 tablet by mouth at bedtime.    [provider]  PARoxetine (PAXIL) 30 MG tablet Take 1 tablet (30 mg total) by mouth daily. 08/26/21   [name], MD  Probiotic Product (PROBIOTIC PO) Take 1 tablet by mouth once a week.    [provider]  warfarin (COUMADIN) 10 MG tablet Take 1/2 a tablet to 1 tablet by mouth  daily as directed by the coumadin clinic. Patient taking differently: Take 5-10 mg by mouth See admin instructions. Take 10 mg every day EXCEPT: Monday 1/2 tablet in the evening or as directed by the coumadin clinic. 03/15/21   Lelon Perla, MD    Physical Exam    Vital Signs:  GASTON DASE does not have vital signs available for review today.  Given telephonic nature of communication, physical exam is limited. AAOx3. NAD. Normal affect.  Speech and respirations are unlabored.  Accessory Clinical Findings    None  Assessment & Plan    1.  Preoperative Cardiovascular Risk Assessment:  According to the Revised Cardiac Risk Index (RCRI), his Perioperative Risk of Major Cardiac Event is (%): 0.4. His Functional Capacity in METs is: 7.01 according to the Duke Activity Status Index (DASI). Therefore, based on ACC/AHA guidelines, patient would be at acceptable risk for the planned procedure without further cardiovascular testing.   Dental extractions are  planned for 09/30/2021 at 4 PM. The patient has a Coumadin clinic appointment 09/23/2021.The patient will require preop antibiotics for dental procedure-we discussed this today and I have provided a prescription for Amoxicillin 2g to take 30-60 minutes prior to procedure. Per office protocol, patient can hold Coumadin for 5 days prior to procedure. The patient will also need bridging with Lovenox around the procedure. This will be arranged at coumadin clinic appointment on 09/23/2021.   A copy of this note will be routed to requesting surgeon.  Time:   Today, I have spent 7 minutes with the patient with telehealth technology discussing medical history, symptoms, and management plan.     Lenna Sciara, NP  09/05/2021, 4:13 PM

## 2021-09-21 ENCOUNTER — Other Ambulatory Visit: Payer: Self-pay | Admitting: *Deleted

## 2021-09-21 DIAGNOSIS — I7122 Aneurysm of the aortic arch, without rupture: Secondary | ICD-10-CM

## 2021-09-23 ENCOUNTER — Other Ambulatory Visit: Payer: Self-pay

## 2021-09-23 ENCOUNTER — Ambulatory Visit (INDEPENDENT_AMBULATORY_CARE_PROVIDER_SITE_OTHER): Payer: Medicare Other

## 2021-09-23 DIAGNOSIS — Z9889 Other specified postprocedural states: Secondary | ICD-10-CM | POA: Diagnosis not present

## 2021-09-23 DIAGNOSIS — Z7901 Long term (current) use of anticoagulants: Secondary | ICD-10-CM | POA: Diagnosis not present

## 2021-09-23 LAB — POCT INR: INR: 3.3 — AB (ref 2.0–3.0)

## 2021-09-23 MED ORDER — ENOXAPARIN SODIUM 80 MG/0.8ML IJ SOSY: 80.0000 mg | PREFILLED_SYRINGE | Freq: Two times a day (BID) | INTRAMUSCULAR | 1 refills | Status: DC

## 2021-09-23 MED ORDER — WARFARIN SODIUM 10 MG PO TABS: ORAL_TABLET | ORAL | 0 refills | Status: DC

## 2021-09-23 NOTE — Patient Instructions (Signed)
HOLD 7/23-7/28; Continue taking 1 tablet daily, except 0.5 tablet on Monday. Dental Extractions 7/28;  Lovenox Bridge Stay consistent with greens each week ( 3 times per week)  Repeat INR in 2 weeks.   7/22: Last dose of warfarin.  7/23: No warfarin or enoxaparin (Lovenox).  7/24: Inject enoxaparin '80mg'$  in the fatty abdominal tissue at least 2 inches from the belly button twice a day about 12 hours apart, 8am and 8pm rotate sites. No warfarin.  7/25: Inject enoxaparin in the fatty tissue every 12 hours, 8am and 8pm. No warfarin.  7/26: Inject enoxaparin in the fatty tissue every 12 hours, 8am and 8pm. No warfarin.  7/27: Inject enoxaparin in the fatty tissue in the morning at 8 am (No PM dose). No warfarin.  7/28: Procedure Day - No enoxaparin - Resume warfarin in the evening or as directed by doctor (take an extra half tablet with usual dose for 2 days then resume normal dose).  7/29: Resume enoxaparin inject in the fatty tissue every 12 hours and take warfarin  7/30: Inject enoxaparin in the fatty tissue every 12 hours and take warfarin  7/31: Inject enoxaparin in the fatty tissue every 12 hours and take warfarin  8/1: Inject enoxaparin in the fatty tissue every 12 hours and take warfarin  8/2: Inject enoxaparin in the fatty tissue every 12 hours and take warfarin  8/3: warfarin appt to check INR.

## 2021-09-29 ENCOUNTER — Telehealth: Payer: Self-pay

## 2021-09-29 NOTE — Telephone Encounter (Signed)
I spoke to the patient in the lobby who is Lovenox Bridging for Dental work 7/28.    He mentioned that they are doing part of the extractions on 7/28 and finish 8/1.   I told him to continue Lovenox until 8/1 and call then for further advisement.  He verbalized understanding

## 2021-10-03 ENCOUNTER — Encounter (HOSPITAL_BASED_OUTPATIENT_CLINIC_OR_DEPARTMENT_OTHER): Payer: Self-pay

## 2021-10-03 ENCOUNTER — Emergency Department (HOSPITAL_BASED_OUTPATIENT_CLINIC_OR_DEPARTMENT_OTHER)
Admission: EM | Admit: 2021-10-03 | Discharge: 2021-10-03 | Disposition: A | Discharge status: Home / Self Care | Payer: Medicare Other | Attending: Emergency Medicine | Admitting: Emergency Medicine

## 2021-10-03 ENCOUNTER — Telehealth: Payer: Self-pay | Admitting: Cardiology

## 2021-10-03 DIAGNOSIS — Z7901 Long term (current) use of anticoagulants: Secondary | ICD-10-CM | POA: Diagnosis not present

## 2021-10-03 DIAGNOSIS — K1379 Other lesions of oral mucosa: Secondary | ICD-10-CM | POA: Diagnosis not present

## 2021-10-03 DIAGNOSIS — I10 Essential (primary) hypertension: Secondary | ICD-10-CM | POA: Insufficient documentation

## 2021-10-03 DIAGNOSIS — Z79899 Other long term (current) drug therapy: Secondary | ICD-10-CM | POA: Insufficient documentation

## 2021-10-03 DIAGNOSIS — Z7982 Long term (current) use of aspirin: Secondary | ICD-10-CM | POA: Insufficient documentation

## 2021-10-03 DIAGNOSIS — Z0120 Encounter for dental examination and cleaning without abnormal findings: Secondary | ICD-10-CM

## 2021-10-03 DIAGNOSIS — Z98818 Other dental procedure status: Secondary | ICD-10-CM | POA: Diagnosis not present

## 2021-10-03 DIAGNOSIS — K08409 Partial loss of teeth, unspecified cause, unspecified class: Secondary | ICD-10-CM

## 2021-10-03 DIAGNOSIS — K0889 Other specified disorders of teeth and supporting structures: Secondary | ICD-10-CM | POA: Diagnosis not present

## 2021-10-03 MED ORDER — GELATIN ABSORBABLE 12-7 MM EX MISC
1.0000 | Freq: Once | CUTANEOUS | Status: AC
  Administered 2021-10-03: 1 via TOPICAL
  Filled 2021-10-03: qty 1

## 2021-10-03 MED ORDER — LIDOCAINE-EPINEPHRINE (PF) 2 %-1:200000 IJ SOLN
INTRAMUSCULAR | Status: AC
  Administered 2021-10-03: 20 mL via INTRADERMAL
  Filled 2021-10-03: qty 20

## 2021-10-03 MED ORDER — LIDOCAINE-EPINEPHRINE (PF) 2 %-1:200000 IJ SOLN: 20.0000 mL | Freq: Once | INTRAMUSCULAR | Status: AC

## 2021-10-03 NOTE — Discharge Instructions (Addendum)
It was a pleasure taking care of you today!   Maintain your scheduled follow up appointment with your dentist tomorrow. Discontinue your lovenox for the next 2 days. You may use black tea bags to the area if you began to have bleeding while at home. If bleeding persist after use of the black tea bag for 20 minutes, return to the ED for evaluation. Ensure to eat soft foods such as mashed potatoes, broth, or soup. Return to the ED if you are experiencing increasing/worsening bleeding, trouble breathing, trouble swallowing, or worsening symptoms.

## 2021-10-03 NOTE — Telephone Encounter (Signed)
FYI, has INR appt on 8/3

## 2021-10-03 NOTE — Telephone Encounter (Signed)
Dr [name] spoke with dentist, they extracted a tooth this morning and they are not able to get the bleeding to stop. They report it is still oozing. Per dr [name] patient is to hold lovenox today and tomorrow. He is to restart Wednesday. He has a follow up appointment in the [facility] clinic Thursday this week. Will make pharm d aware.

## 2021-10-03 NOTE — ED Provider Notes (Signed)
[facility] EMERGENCY DEPARTMENT Provider Note   CSN: [id number] Arrival date & time: 10/03/21  1148     History  Chief Complaint  Patient presents with  . Dental Problem    Corey Mathis is a 71 y.o. male with a PMHx of *** who presents to the ED complaining of dental problem onset last night. Pt notes that he had his upper teeth removed by his dentist 4 days ago. He has been taking his anticoagulant (lovenox) since the procedure. No other medications tried PTA. Discussed with his dentist who informed him to come into the ED today. Denies trouble breathing, trouble swallowing.   Discussion with patient's dentist, Dr. Rex Kras who noted that he evaluated the patient in the office today and used gauze with hemogen as well as septacaine with epi injection in the office. Dr. Rex Kras noted a slight ooze to the left incisor tooth and sent the patient to the ED for further evaluation.   The history is provided by the patient. No language interpreter was used.       Home Medications Prior to Admission medications   Medication Sig Start Date End Date Taking? Authorizing Provider  albuterol (PROAIR HFA) 108 (90 Base) MCG/ACT inhaler Inhale 2 puffs into the lungs every 6 (six) hours as needed for wheezing. 12/06/16 09/07/24  [name], MD  amoxicillin (AMOXIL) 500 MG tablet Take 4 tablets (2,000 mg total) by mouth once as needed for up to 1 dose (Take 4 tablets 30-60 minutes prior to dental cleanings/procedures.). 09/05/21   Lenna Sciara, NP  antiseptic oral rinse (BIOTENE) LIQD 5 mLs by Mouth Rinse route as needed for dry mouth or mouth pain.    [provider]  aspirin 81 MG EC tablet Take 81 mg by mouth at bedtime.    [provider]  atorvastatin (LIPITOR) 40 MG tablet Take 1 tablet by mouth once daily 08/22/21   Lelon Perla, MD  cholecalciferol (VITAMIN D) 1000 units tablet Take 1,000 Units by mouth once a week.    [provider]   COENZYME Q10 PO Take 1 capsule by mouth once a week.    [provider]  enoxaparin (LOVENOX) 80 MG/0.8ML injection Inject 0.8 mLs (80 mg total) into the skin every 12 (twelve) hours. 09/23/21   Lelon Perla, MD  fluticasone (FLONASE) 50 MCG/ACT nasal spray Place 1 spray into both nostrils daily as needed (congestion). 07/25/19   Hall-Potvin, [place], PA-C  fluticasone-salmeterol (ADVAIR) 250-50 MCG/ACT AEPB INHALE ONE PUFF BY MOUTH TWICE A DAY 10/21/20   [name], MD  hydrALAZINE (APRESOLINE) 50 MG tablet Take 1.5 tablets (75 mg total) by mouth 3 (three) times daily. 11/23/20   Lelon Perla, MD  isosorbide mononitrate (IMDUR) 60 MG 24 hr tablet Take 1 tablet by mouth once daily 08/11/21   Lelon Perla, MD  metoprolol succinate (TOPROL-XL) 100 MG 24 hr tablet Take 1 and 1/2 tablets daily Take with or immediately following a meal. Patient taking differently: Take 1 and 1/2 tablets daily Take with or immediately following a meal. Patient taking 150 mg total. 06/27/21   Lelon Perla, MD  Metoprolol Succinate 50 MG CS24 Take 50 mg by mouth daily.    [provider]  Multiple Vitamin (MULTIVITAMIN WITH MINERALS) TABS tablet Take 1 tablet by mouth at bedtime.    [provider]  PARoxetine (PAXIL) 30 MG tablet Take 1 tablet (30 mg total) by mouth daily. 08/26/21  [name], MD  Probiotic Product (PROBIOTIC PO) Take 1 tablet by mouth once a week.    [provider]  warfarin (COUMADIN) 10 MG tablet Take 1/2 a tablet to 1 tablet by mouth daily as directed by the coumadin clinic. 09/23/21   Lelon Perla, MD      Allergies    Lisinopril, Carvedilol, Ezetimibe-simvastatin, Propranolol hcl, Ramipril, and Codeine    Review of Systems   Review of Systems  All other systems reviewed and are negative.   Physical Exam Updated Vital Signs BP (!) 164/108 (BP Location: Right Arm)   Pulse 73   Temp 97.7 F (36.5 C)   Resp 18    Ht '6\' 4"'$  (1.93 m)   Wt 90.7 kg   SpO2 98%   BMI 24.34 kg/m  Physical Exam Vitals and nursing note reviewed.  Constitutional:      General: He is not in acute distress.    Appearance: He is not diaphoretic.  HENT:     Head: Normocephalic and atraumatic.     Mouth/Throat:     Pharynx: No oropharyngeal exudate.  Eyes:     General: No scleral icterus.    Conjunctiva/sclera: Conjunctivae normal.  Cardiovascular:     Rate and Rhythm: Normal rate and regular rhythm.     Pulses: Normal pulses.     Heart sounds: Normal heart sounds.  Pulmonary:     Effort: Pulmonary effort is normal. No respiratory distress.     Breath sounds: Normal breath sounds. No wheezing.  Abdominal:     General: Bowel sounds are normal.     Palpations: Abdomen is soft. There is no mass.     Tenderness: There is no abdominal tenderness. There is no guarding or rebound.  Musculoskeletal:        General: Normal range of motion.     Cervical back: Normal range of motion and neck supple.  Skin:    General: Skin is warm and dry.  Neurological:     Mental Status: He is alert.  Psychiatric:        Behavior: Behavior normal.    ED Results / Procedures / Treatments   Labs (all labs ordered are listed, but only abnormal results are displayed) Labs Reviewed - No data to display  EKG None  Radiology No results found.  Procedures Procedures    Medications Ordered in ED Medications - No data to display  ED Course/ Medical Decision Making/ A&P Clinical Course as of 10/03/21 1525  Mon Oct 03, 2021  1517 Discussion with dentist office, gauze with hemogym. Injection sepatacaine with epi. I tooth on left hand and slow ooze to back of mouth. Purge to help control bleeding. Coumadin discontinue for 2 days.  [SB]    Clinical Course User Index [SB] Khadijatou Borak A, PA-C                           Medical Decision Making Risk Prescription drug management.   Pt presents with dental problem onset last night.  Pt had his *** upper teeth removed 4 days ago. Has still been taking his lovenox. No antihypertensives today. Pt afebrile, not tachycardic or hypoxic. On exam, patient with ***. No acute cardiovascular, respiratory, exam findings. Differential diagnosis includes ***.    Co morbidities that complicate the patient evaluation: HTN  Additional history obtained:  Additional history obtained from Patient dentist, Dr. Rex Kras who notes that he evaluated the patient in  the office today and used gauze with hemogen as well as septacaine with epi injection in the office. Dr. Rex Kras noted a slight ooze to the left incisor tooth and sent the patient to the ED for further evaluation.   Medications:  I ordered medication including *** for *** Reevaluation of the patient after these medicines and interventions, I reevaluated the patient and found that they have {resolved/improved/worsened:23923::"improved"} I have reviewed the patients home medicines and have made adjustments as needed   Disposition: {End of MDM here with the likely diagnosis}. After consideration of the diagnostic results and the patients response to treatment, I feel that the patient would benefit from Discharge home. Pt to maintain follow up with his Dentist as scheduled tomorrow morning. Supportive care measures and strict return precautions discussed with patient at bedside. Pt acknowledges and verbalizes understanding. Pt appears safe for discharge. Follow up as indicated in discharge paperwork.    This chart was dictated using voice recognition software, Dragon. Despite the best efforts of this provider to proofread and correct errors, errors may still occur which can change documentation meaning.   Final Clinical Impression(s) / ED Diagnoses Final diagnoses:  None    Rx / DC Orders ED Discharge Orders     None

## 2021-10-03 NOTE — ED Notes (Signed)
States did not take antihypertensives today

## 2021-10-03 NOTE — ED Triage Notes (Signed)
Had upper teeth removed Friday, states one spot started bleeding last night, continues to bleed. Taking Lovenox shots.

## 2021-10-03 NOTE — Telephone Encounter (Signed)
Caller states patient is in the chair at the dentist's office and he is bleeding very badly.

## 2021-10-06 ENCOUNTER — Ambulatory Visit (INDEPENDENT_AMBULATORY_CARE_PROVIDER_SITE_OTHER): Payer: Medicare Other

## 2021-10-06 DIAGNOSIS — Z9889 Other specified postprocedural states: Secondary | ICD-10-CM

## 2021-10-06 DIAGNOSIS — Z7901 Long term (current) use of anticoagulants: Secondary | ICD-10-CM

## 2021-10-06 LAB — POCT INR: INR: 1 — AB (ref 2.0–3.0)

## 2021-10-06 NOTE — Patient Instructions (Signed)
HOLD UNTIL DENTAL WORK then  Continue taking 1 tablet daily, except 0.5 tablet on Monday. Dental Extractions TBD;  Lovenox Bridge Stay consistent with greens each week ( 3 times per week)  Repeat INR in 1 week.

## 2021-10-14 ENCOUNTER — Ambulatory Visit (INDEPENDENT_AMBULATORY_CARE_PROVIDER_SITE_OTHER): Payer: Medicare Other

## 2021-10-14 DIAGNOSIS — Z9889 Other specified postprocedural states: Secondary | ICD-10-CM

## 2021-10-14 DIAGNOSIS — Z7901 Long term (current) use of anticoagulants: Secondary | ICD-10-CM | POA: Diagnosis not present

## 2021-10-14 LAB — POCT INR: INR: 1 — AB (ref 2.0–3.0)

## 2021-10-14 NOTE — Patient Instructions (Signed)
HOLD UNTIL DENTAL WORK 8/14 then Continue taking 1 tablet daily, except 0.5 tablet on Monday.   Lovenox Bridge Stay consistent with greens each week ( 3 times per week)  Repeat INR in 1 week.   8/13: Inject enoxaparin in the fatty tissue in the morning at 8 am (No PM dose). No warfarin.  8/14: Procedure Day - No enoxaparin - Resume warfarin in the evening or as directed by doctor (take an extra half tablet with usual dose for 2 days then resume normal dose).  8/15: Resume enoxaparin inject in the fatty tissue every 12 hours and take warfarin  8/16: Inject enoxaparin in the fatty tissue every 12 hours and take warfarin  8/17: warfarin appt to check INR.

## 2021-10-19 ENCOUNTER — Ambulatory Visit
Admission: RE | Admit: 2021-10-19 | Discharge: 2021-10-19 | Disposition: A | Discharge status: Home / Self Care | Payer: Medicare Other | Source: Ambulatory Visit | Attending: Cardiology | Admitting: Cardiology

## 2021-10-19 DIAGNOSIS — J929 Pleural plaque without asbestos: Secondary | ICD-10-CM | POA: Diagnosis not present

## 2021-10-19 DIAGNOSIS — J984 Other disorders of lung: Secondary | ICD-10-CM | POA: Diagnosis not present

## 2021-10-19 DIAGNOSIS — I7122 Aneurysm of the aortic arch, without rupture: Secondary | ICD-10-CM

## 2021-10-19 DIAGNOSIS — I712 Thoracic aortic aneurysm, without rupture, unspecified: Secondary | ICD-10-CM | POA: Diagnosis not present

## 2021-10-19 DIAGNOSIS — I2510 Atherosclerotic heart disease of native coronary artery without angina pectoris: Secondary | ICD-10-CM | POA: Diagnosis not present

## 2021-10-19 MED ORDER — IOPAMIDOL (ISOVUE-370) INJECTION 76%
75.0000 mL | Freq: Once | INTRAVENOUS | Status: AC | PRN
  Administered 2021-10-19: 75 mL via INTRAVENOUS

## 2021-10-20 ENCOUNTER — Ambulatory Visit (INDEPENDENT_AMBULATORY_CARE_PROVIDER_SITE_OTHER): Payer: Medicare Other

## 2021-10-20 DIAGNOSIS — Z7901 Long term (current) use of anticoagulants: Secondary | ICD-10-CM

## 2021-10-20 DIAGNOSIS — Z9889 Other specified postprocedural states: Secondary | ICD-10-CM | POA: Diagnosis not present

## 2021-10-20 LAB — POCT INR: INR: 1 — AB (ref 2.0–3.0)

## 2021-10-20 NOTE — Patient Instructions (Signed)
Continue Lovenox Injections until complete; TAKE 2 TABLETS TODAY and FRIDAY then  Continue taking 1 tablet daily, except 0.5 tablet on Monday.    Stay consistent with greens each week ( 3 times per week)  Repeat INR in 1 week.

## 2021-10-21 ENCOUNTER — Other Ambulatory Visit: Payer: Self-pay | Admitting: Cardiology

## 2021-10-28 ENCOUNTER — Ambulatory Visit (INDEPENDENT_AMBULATORY_CARE_PROVIDER_SITE_OTHER): Payer: Medicare Other | Admitting: *Deleted

## 2021-10-28 DIAGNOSIS — Z23 Encounter for immunization: Secondary | ICD-10-CM | POA: Diagnosis not present

## 2021-10-28 DIAGNOSIS — Z9889 Other specified postprocedural states: Secondary | ICD-10-CM

## 2021-10-28 DIAGNOSIS — Z7901 Long term (current) use of anticoagulants: Secondary | ICD-10-CM

## 2021-10-28 LAB — POCT INR: INR: 2.6 (ref 2.0–3.0)

## 2021-10-28 NOTE — Patient Instructions (Addendum)
Description   Today take 1.5 tablets then continue taking 1 tablet daily, except 0.5 tablet on Monday.    Stay consistent with greens each week (3 times per week).   Repeat INR in 2 weeks. [phone]

## 2021-11-05 ENCOUNTER — Other Ambulatory Visit: Payer: Self-pay | Admitting: Cardiology

## 2021-11-05 DIAGNOSIS — I428 Other cardiomyopathies: Secondary | ICD-10-CM

## 2021-11-11 ENCOUNTER — Ambulatory Visit: Payer: Medicare Other | Attending: Cardiology

## 2021-11-11 DIAGNOSIS — Z7901 Long term (current) use of anticoagulants: Secondary | ICD-10-CM

## 2021-11-11 DIAGNOSIS — Z9889 Other specified postprocedural states: Secondary | ICD-10-CM | POA: Insufficient documentation

## 2021-11-11 LAB — POCT INR: INR: 4 — AB (ref 2.0–3.0)

## 2021-11-11 NOTE — Patient Instructions (Signed)
HOLD TODAY ONLY and then continue taking 1 tablet daily, except 0.5 tablet on Monday.    Stay consistent with greens each week (3 times per week).   Repeat INR in 2 weeks. [phone]

## 2021-11-16 ENCOUNTER — Other Ambulatory Visit: Payer: Self-pay | Admitting: *Deleted

## 2021-11-16 DIAGNOSIS — J4530 Mild persistent asthma, uncomplicated: Secondary | ICD-10-CM

## 2021-11-16 MED ORDER — FLUTICASONE-SALMETEROL 250-50 MCG/ACT IN AEPB: INHALATION_SPRAY | RESPIRATORY_TRACT | 11 refills | Status: DC

## 2021-11-25 ENCOUNTER — Ambulatory Visit: Payer: Medicare Other | Attending: Cardiology | Admitting: *Deleted

## 2021-11-25 DIAGNOSIS — Z5181 Encounter for therapeutic drug level monitoring: Secondary | ICD-10-CM | POA: Diagnosis not present

## 2021-11-25 DIAGNOSIS — Z9889 Other specified postprocedural states: Secondary | ICD-10-CM | POA: Insufficient documentation

## 2021-11-25 DIAGNOSIS — Z7901 Long term (current) use of anticoagulants: Secondary | ICD-10-CM | POA: Diagnosis not present

## 2021-11-25 LAB — POCT INR: INR: 1.9 — AB (ref 2.0–3.0)

## 2021-11-25 NOTE — Patient Instructions (Signed)
Description   Take 1.5 tablets of warfarin today and tomorrow then continue taking 1 tablet daily, except  1/2 a tablet on Monday.    Stay consistent with greens each week (3 times per week).   Repeat INR in 2 weeks. [phone]

## 2021-12-02 ENCOUNTER — Ambulatory Visit (INDEPENDENT_AMBULATORY_CARE_PROVIDER_SITE_OTHER): Payer: Medicare Other | Admitting: Physician Assistant

## 2021-12-02 DIAGNOSIS — Z0000 Encounter for general adult medical examination without abnormal findings: Secondary | ICD-10-CM

## 2021-12-02 NOTE — Progress Notes (Signed)
MEDICARE ANNUAL WELLNESS VISIT  12/02/2021  Telephone Visit Disclaimer This Medicare AWV was conducted by telephone due to national recommendations for restrictions regarding the COVID-19 Pandemic (e.g. social distancing).  I verified, using two identifiers, that I am speaking with Corey Mathis or their authorized healthcare agent. I discussed the limitations, risks, security, and privacy concerns of performing an evaluation and management service by telephone and the potential availability of an in-person appointment in the future. The patient expressed understanding and agreed to proceed.  Location of Patient: Home Location of Provider (nurse):  In the office.  Subjective:    Corey Mathis is a 71 y.o. male patient of Metheney, Corey Kocher, MD who had a Medicare Annual Wellness Visit today via telephone. Corey Mathis is Retired and lives alone. he has 2 children. he reports that he is socially active and does interact with friends/family regularly. he is minimally physically active and enjoys reading and house stuff.  Patient Care Team: Corey Marry, MD as PCP - General (Family Medicine) Corey Mathis Corey Bors, MD as PCP - Cardiology (Cardiology) Corey Marry, MD (Family Medicine)     12/02/2021   10:13 AM 10/03/2021   12:05 PM 04/21/2021    9:35 AM 10/29/2020    2:46 PM 02/11/2018    9:27 AM 11/01/2017    9:40 AM 10/17/2017    5:00 PM  Advanced Directives  Does Patient Have a Medical Advance Directive? Yes Yes Yes Yes No Yes No  Type of Advance Directive Living will Living will;Healthcare Power of [place];Living will   Does patient want to make changes to medical advance directive? No - Patient declined   No - Patient declined     Copy of [place] in Chart?   No - copy requested No - copy requested  No - copy requested   Would patient like information  on creating a medical advance directive?    No - Patient declined No - Patient declined  No - Patient declined    Hospital Utilization Over the Past 12 Months: # of hospitalizations or ER visits: 1 # of surgeries: 0  Review of Systems    Patient reports that his overall health is better compared to last year.  History obtained from chart review and the patient  Patient Reported Readings (BP, Pulse, CBG, Weight, etc) none  Pain Assessment Pain : No/denies pain     Current Medications & Allergies (verified) Allergies as of 12/02/2021       Reactions   Lisinopril Anaphylaxis, Other (See Comments)   angioedema   Carvedilol Other (See Comments)   wheezing   Ezetimibe-simvastatin Other (See Comments)   Serious vertigo   Propranolol Hcl Other (See Comments)   wheezing   Ramipril Other (See Comments)   wheezing   Codeine Nausea Only        Medication List        Accurate as of December 02, 2021 10:18 AM. If you have any questions, ask your nurse or doctor.          albuterol 108 (90 Base) MCG/ACT inhaler Commonly known as: ProAir HFA Inhale 2 puffs into the lungs every 6 (six) hours as needed for wheezing.   amoxicillin 500 MG tablet Commonly known as: AMOXIL Take 4 tablets (2,000 mg total) by mouth once as needed for up to 1 dose (Take 4 tablets 30-60 minutes prior to  dental cleanings/procedures.).   antiseptic oral rinse Liqd 5 mLs by Mouth Rinse route as needed for dry mouth or mouth pain.   aspirin EC 81 MG tablet Take 81 mg by mouth at bedtime.   atorvastatin 40 MG tablet Commonly known as: LIPITOR Take 1 tablet by mouth once daily   cholecalciferol 1000 units tablet Commonly known as: VITAMIN D Take 1,000 Units by mouth once a week.   COENZYME Q10 PO Take 1 capsule by mouth once a week.   enoxaparin 80 MG/0.8ML injection Commonly known as: LOVENOX Inject 0.8 mLs (80 mg total) into the skin every 12 (twelve) hours.   fluticasone 50 MCG/ACT  nasal spray Commonly known as: FLONASE Place 1 spray into both nostrils daily as needed (congestion).   fluticasone-salmeterol 250-50 MCG/ACT Aepb Commonly known as: ADVAIR INHALE ONE PUFF BY MOUTH TWICE A DAY   hydrALAZINE 50 MG tablet Commonly known as: APRESOLINE TAKE 1 TABLET BY MOUTH THREE TIMES DAILY   isosorbide mononitrate 60 MG 24 hr tablet Commonly known as: IMDUR Take 1 tablet by mouth once daily   Metoprolol Succinate 50 MG Cs24 Take 50 mg by mouth daily. What changed: Another medication with the same name was changed. Make sure you understand how and when to take each.   metoprolol succinate 100 MG 24 hr tablet Commonly known as: TOPROL-XL Take 1 and 1/2 tablets daily Take with or immediately following a meal. What changed: additional instructions   multivitamin with minerals Tabs tablet Take 1 tablet by mouth at bedtime.   PARoxetine 30 MG tablet Commonly known as: PAXIL Take 1 tablet (30 mg total) by mouth daily.   PROBIOTIC PO Take 1 tablet by mouth once a week.   warfarin 10 MG tablet Commonly known as: COUMADIN Take as directed by the anticoagulation clinic. If you are unsure how to take this medication, talk to your nurse or doctor. Original instructions: Take 1/2 a tablet to 1 tablet by mouth daily as directed by the coumadin clinic.        History (reviewed): Past Medical History:  Diagnosis Date   Allergy    Anxiety    Asthma    CHF (congestive heart failure) (HCC)    Dyslipidemia    History of mitral valve replacement    Hypertension    Labyrinthitis    hx   Past Surgical History:  Procedure Laterality Date   ACNE CYST REMOVAL     from hand and back   COLONOSCOPY WITH PROPOFOL N/A 04/21/2021   Procedure: COLONOSCOPY WITH PROPOFOL;  Surgeon: Corey Mathis., MD;  Location: [place];  Service: Gastroenterology;  Laterality: N/A;   EXTERNAL FIXATION LEG Right 08/11/2017   Procedure: EXTERNAL FIXATION, RIGHT KNEE;  Surgeon:  Corey Koyanagi, MD;  Location: [place];  Service: Orthopedics;  Laterality: Right;   FINE NEEDLE ASPIRATION Right 08/11/2017   Procedure: FINE NEEDLE ASPIRATION  of right Knee with 80 cc of dark red fluid removed;  Surgeon: Corey Koyanagi, MD;  Location: [place];  Service: Orthopedics;  Laterality: Right;   FLEXIBLE SIGMOIDOSCOPY N/A 02/11/2018   Procedure: FLEXIBLE SIGMOIDOSCOPY;  Surgeon: Corey Stabler, MD;  Location: [place];  Service: Gastroenterology;  Laterality: N/A;   Jaw surgery (other)     NASAL SINUS SURGERY N/A 10/16/2012   Procedure: NASAL ENDOSCOPIC POLYPECTOMY/MAXILLARY ANTROSTOMY/ETHMOIDECTOMY;  Surgeon: Izora Gala, MD;  Location: [place];  Service: ENT;  Laterality: N/A;   ORIF TIBIA PLATEAU Left 08/13/2017   Procedure: OPEN  REDUCTION INTERNAL FIXATION (ORIF) TIBIAL PLATEAU;  Surgeon: Corey Koyanagi, MD;  Location: [place];  Service: Orthopedics;  Laterality: Left;   ORIF TIBIA PLATEAU Right 08/22/2017   Procedure: OPEN REDUCTION INTERNAL FIXATION (ORIF) RIGHT BICONDYLAR TIBIAL PLATEAU, REMOVAL OF EX FIX;  Surgeon: Corey Koyanagi, MD;  Location: [place];  Service: Orthopedics;  Laterality: Right;   POLYPECTOMY  04/21/2021   Procedure: POLYPECTOMY;  Surgeon: Mansouraty, Telford Nab., MD;  Location: [place];  Service: Gastroenterology;;   RIGHT/LEFT HEART CATH AND CORONARY ANGIOGRAPHY N/A 07/12/2017   Procedure: RIGHT/LEFT HEART CATH AND CORONARY ANGIOGRAPHY;  Surgeon: Burnell Blanks, MD;  Location: [place] [facility];  Service: Cardiovascular;  Laterality: N/A;   SUBMUCOSAL INJECTION  02/11/2018   Procedure: SUBMUCOSAL INJECTION;  Surgeon: Corey Stabler, MD;  Location: [facility] END[facility];  Service: Gastroenterology;;   TOOTH EXTRACTION     VALVE REPLACEMENT  1998   St. Jude, mitral   Family History  Problem Relation Age of Onset   Heart attack Father 87   Colon cancer Mother 66   Hypertension Mother    Breast cancer Maternal Aunt    Stomach cancer Neg Hx    Esophageal  cancer Neg Hx    Inflammatory bowel disease Neg Hx    Liver disease Neg Hx    Pancreatic cancer Neg Hx    Social History   Socioeconomic History   Marital status: Married    Spouse name: Frenchie    Number of children: 2   Years of education: 20   Highest education level: Professional school degree (e.g., MD, DDS, DVM, JD)  Occupational History   Occupation: retired    Fish farm manager: [employer]  Tobacco Use   Smoking status: Never   Smokeless tobacco: Never  Vaping Use   Vaping Use: Never used  Substance and Sexual Activity   Alcohol use: Never   Drug use: Never   Sexual activity: Not Currently  Other Topics Concern   Not on file  Social History Narrative   Lives alone. He has two children. He enjoys reading and house stuff.   Social Determinants of Health   Financial Resource Strain: Low Risk  (12/02/2021)   Overall Financial Resource Strain (CARDIA)    Difficulty of Paying Living Expenses: Not hard at all  Food Insecurity: No Food Insecurity (12/02/2021)   Hunger Vital Sign    Worried About Running Out of Food in the Last Year: Never true    Ran Out of Food in the Last Year: Never true  Transportation Needs: No Transportation Needs (12/02/2021)   PRAPARE - Hydrologist (Medical): No    Lack of Transportation (Non-Medical): No  Physical Activity: Sufficiently Active (12/02/2021)   Exercise Vital Sign    Days of Exercise per Week: 7 days    Minutes of Exercise per Session: 30 min  Stress: No Stress Concern Present (12/02/2021)   [place]    Feeling of Stress : Not at all  Social Connections: Moderately Integrated (12/02/2021)   Social Connection and Isolation Panel [NHANES]    Frequency of Communication with Friends and Family: More than three times a week    Frequency of Social Gatherings with Friends and Family: Once a week    Attends Religious Services: Never    Building surveyor or Organizations: Yes    Attends Music therapist: More than 4 times per year  Marital Status: Married    Activities of Daily Living    12/02/2021   10:09 AM  In your present state of health, do you have any difficulty performing the following activities:  Hearing? 0  Vision? 0  Difficulty concentrating or making decisions? 0  Walking or climbing stairs? 0  Dressing or bathing? 0  Doing errands, shopping? 0  Preparing Food and eating ? N  Using the Toilet? N  In the past six months, have you accidently leaked urine? N  Do you have problems with loss of bowel control? N  Managing your Medications? N  Managing your Finances? N  Housekeeping or managing your Housekeeping? N    Patient Education/ Literacy How often do you need to have someone help you when you read instructions, pamphlets, or other written materials from your doctor or pharmacy?: 1 - Never What is the last grade level you completed in school?: PhD  Exercise Current Exercise Habits: Home exercise routine, Type of exercise: walking, Time (Minutes): 30, Frequency (Times/Week): 7, Weekly Exercise (Minutes/Week): 210, Intensity: Moderate, Exercise limited by: None identified  Diet Patient reports consuming 2 meals a day and 2 snack(s) a day Patient reports that his primary diet is: Regular Patient reports that she does have regular access to food.   Depression Screen    12/02/2021   10:11 AM 08/26/2021    1:30 PM 02/21/2021    2:04 PM 10/29/2020    2:48 PM 05/29/2019    2:05 PM 01/29/2018    9:49 AM 10/17/2017    4:59 PM  PHQ 2/9 Scores  PHQ - 2 Score 0 0 0 0 0 0 0  PHQ- 9 Score     0 1      Fall Risk    12/02/2021   10:11 AM 08/26/2021    1:30 PM 10/29/2020    2:48 PM 05/29/2019    2:04 PM 01/29/2019    5:20 PM  Fall Risk   Falls in the past year? 0 0 0 0 0  Comment     Emmi Telephone Survey: data to providers prior to load  Number falls in past yr: 0 0 0 0   Injury with  Fall? 0 0 0 0   Risk for fall due to : No Fall Risks No Fall Risks No Fall Risks No Fall Risks   Follow up Falls evaluation completed Falls prevention discussed;Falls evaluation completed Falls evaluation completed       Objective:  Corey Mathis seemed alert and oriented and he participated appropriately during our telephone visit.  Blood Pressure Weight BMI  BP Readings from Last 3 Encounters:  10/03/21 (!) 144/102  08/26/21 114/72  06/22/21 (!) 169/86   Wt Readings from Last 3 Encounters:  10/03/21 200 lb (90.7 kg)  08/26/21 195 lb (88.5 kg)  03/25/21 189 lb (85.7 kg)   BMI Readings from Last 1 Encounters:  10/03/21 24.34 kg/m    *Unable to obtain current vital signs, weight, and BMI due to telephone visit type  Hearing/Vision  Rajohn did not seem to have difficulty with hearing/understanding during the telephone conversation Reports that he has not had a formal eye exam by an eye care professional within the past year Reports that he has not had a formal hearing evaluation within the past year *Unable to fully assess hearing and vision during telephone visit type  Cognitive Function:    12/02/2021   10:13 AM 10/29/2020    2:55 PM 11/01/2017  9:40 AM 09/05/2016    2:21 PM  6CIT Screen  What Year? 0 points 0 points 0 points 0 points  What month? 0 points 0 points 0 points 0 points  What time? 0 points 0 points 0 points 0 points  Count back from 20 0 points 0 points 0 points 0 points  Months in reverse 0 points 0 points 0 points 0 points  Repeat phrase 0 points 0 points 0 points 2 points  Total Score 0 points 0 points 0 points 2 points   (Normal:0-7, Significant for Dysfunction: >8)  Normal Cognitive Function Screening: Yes   Immunization & Health Maintenance Record Immunization History  Administered Date(s) Administered   Fluad Quad(high Dose 65+) 10/31/2018   Hep A / Hep B 09/29/2021   Influenza Split 02/10/2011   Influenza, High Dose Seasonal PF 11/04/2016,  11/26/2017   Influenza,inj,Quad PF,6+ Mos 01/13/2013, 01/26/2014, 01/12/2015   Influenza-Unspecified 12/26/2019, 11/24/2020   PFIZER(Purple Top)SARS-COV-2 Vaccination 04/07/2019, 04/29/2019, 12/26/2019, 08/16/2020   Pfizer Covid-19 Vaccine Bivalent Booster 18yr & up 11/24/2020   Pneumococcal Conjugate-13 09/23/2013   Pneumococcal Polysaccharide-23 03/13/2011, 05/27/2018   Pneumococcal-Unspecified 05/10/2016   Tdap 03/07/1999, 08/10/2015, 11/24/2020   Zoster Recombinat (Shingrix) 01/25/2019    Health Maintenance  Topic Date Due   COVID-19 Vaccine (6 - Pfizer series) 12/18/2021 (Originally 03/26/2021)   Zoster Vaccines- Shingrix (2 of 2) 03/03/2022 (Originally 03/22/2019)   INFLUENZA VACCINE  06/04/2022 (Originally 10/04/2021)   COLONOSCOPY (Pts 45-444yrInsurance coverage will need to be confirmed)  04/21/2024   TETANUS/TDAP  11/25/2030   Pneumonia Vaccine 6561Years old  Completed   Hepatitis C Screening  Completed   HPV VACCINES  Aged Out       Assessment  This is a routine wellness examination for [facility] Health Maintenance: Due or Overdue There are no preventive care reminders to display for this patient.   Wi[facility]ad[facility] not need a referral for Community Assistance: Care Management:   no Social Work:    no Prescription Assistance:  no Nutrition/Diabetes Education:  no   Plan:  Personalized Goals  Goals Addressed               This Visit's Progress     Patient Stated (pt-stated)        12/02/2021 AWV Goal: Exercise for General Health  Patient will verbalize understanding of the benefits of increased physical activity: Exercising regularly is important. It will improve your overall fitness, flexibility, and endurance. Regular exercise also will improve your overall health. It can help you control your weight, reduce stress, and improve your bone density. Over the next year, patient will increase physical activity as tolerated with a goal of at least  150 minutes of moderate physical activity per week.  You can tell that you are exercising at a moderate intensity if your heart starts beating faster and you start breathing faster but can still hold a conversation. Moderate-intensity exercise ideas include: Walking 1 mile (1.6 km) in about 15 minutes Biking Hiking Golfing Dancing Water aerobics Patient will verbalize understanding of everyday activities that increase physical activity by providing examples like the following: Yard work, such as: PuSales promotion account executiveardening Washing windows or floors Patient will be able to explain general safety guidelines for exercising:  Before you start a new exercise program, talk with your health care provider. Do not exercise so much that you hurt yourself, feel  dizzy, or get very short of breath. Wear comfortable clothes and wear shoes with good support. Drink plenty of water while you exercise to prevent dehydration or heat stroke. Work out until your breathing and your heartbeat get faster.        Personalized Health Maintenance & Screening Recommendations  Influenza vaccine Shingrix vaccine  - 2nd dose  Lung Cancer Screening Recommended: no (Low Dose CT Chest recommended if Age 98-80 years, 30 pack-year currently smoking OR have quit w/in past 15 years) Hepatitis C Screening recommended: no HIV Screening recommended: no  Advanced Directives: Written information was not prepared per patient's request.  Referrals & Orders No orders of the defined types were placed in this encounter.   Follow-up Plan Follow-up with Corey Marry, MD as planned Please bring the immunization record for your shingles vaccine and the influenza vaccine. Medicare wellness visit in one year. Patient will access AVS on my chart.   I have personally reviewed and noted the following in the patient's chart:   Medical  and social history Use of alcohol, tobacco or illicit drugs  Current medications and supplements Functional ability and status Nutritional status Physical activity Advanced directives List of other physicians Hospitalizations, surgeries, and ER visits in previous 12 months Vitals Screenings to include cognitive, depression, and falls Referrals and appointments  In addition, I have reviewed and discussed with Corey Mathis certain preventive protocols, quality metrics, and best practice recommendations. A written personalized care plan for preventive services as well as general preventive health recommendations is available and can be mailed to the patient at his request.      Tinnie Gens, RN BSN  12/02/2021

## 2021-12-02 NOTE — Patient Instructions (Addendum)
[facility] Maintenance Summary and Written Plan of Care  [name] ,  Thank you for allowing me to perform your Medicare Annual Wellness Visit and for your ongoing commitment to your health.   Health Maintenance & Immunization History Health Maintenance  Topic Date Due  . COVID-19 Vaccine (6 - Pfizer series) 12/18/2021 (Originally 03/26/2021)  . Zoster Vaccines- Shingrix (2 of 2) 03/03/2022 (Originally 03/22/2019)  . INFLUENZA VACCINE  06/04/2022 (Originally 10/04/2021)  . COLONOSCOPY (Pts 45-10yr Insurance coverage will need to be confirmed)  04/21/2024  . TETANUS/TDAP  11/25/2030  . Pneumonia Vaccine 71 Years old  Completed  . Hepatitis C Screening  Completed  . HPV VACCINES  Aged Out   Immunization History  Administered Date(s) Administered  . Fluad Quad(high Dose 65+) 10/31/2018  . Hep A / Hep B 09/29/2021  . Influenza Split 02/10/2011  . Influenza, High Dose Seasonal PF 11/04/2016, 11/26/2017  . Influenza,inj,Quad PF,6+ Mos 01/13/2013, 01/26/2014, 01/12/2015  . Influenza-Unspecified 12/26/2019, 11/24/2020  . PFIZER(Purple Top)SARS-COV-2 Vaccination 04/07/2019, 04/29/2019, 12/26/2019, 08/16/2020  . PPension scheme manager132yr& up 11/24/2020  . Pneumococcal Conjugate-13 09/23/2013  . Pneumococcal Polysaccharide-23 03/13/2011, 05/27/2018  . Pneumococcal-Unspecified 05/10/2016  . Tdap 03/07/1999, 08/10/2015, 11/24/2020  . Zoster Recombinat (Shingrix) 01/25/2019    These are the patient goals that we discussed:  Goals Addressed              This Visit's Progress   .  Patient Stated (pt-stated)        12/02/2021 AWV Goal: Exercise for General Health  Patient will verbalize understanding of the benefits of increased physical activity: Exercising regularly is important. It will improve your overall fitness, flexibility, and endurance. Regular exercise also will improve your overall health. It can help you control your  weight, reduce stress, and improve your bone density. Over the next year, patient will increase physical activity as tolerated with a goal of at least 150 minutes of moderate physical activity per week.  You can tell that you are exercising at a moderate intensity if your heart starts beating faster and you start breathing faster but can still hold a conversation. Moderate-intensity exercise ideas include: Walking 1 mile (1.6 km) in about 15 minutes Biking Hiking Golfing Dancing Water aerobics Patient will verbalize understanding of everyday activities that increase physical activity by providing examples like the following: Yard work, such as: PuSales promotion account executiveardening Washing windows or floors Patient will be able to explain general safety guidelines for exercising:  Before you start a new exercise program, talk with your health care provider. Do not exercise so much that you hurt yourself, feel dizzy, or get very short of breath. Wear comfortable clothes and wear shoes with good support. Drink plenty of water while you exercise to prevent dehydration or heat stroke. Work out until your breathing and your heartbeat get faster.         This is a list of Health Maintenance Items that are overdue or due now: Influenza vaccine Shingrix vaccine - 2nd dose  Orders/Referrals Placed Today: No orders of the defined types were placed in this encounter.  (Contact our referral department at [phone] you have not spoken with someone about your referral appointment within the next 5 days)    Follow-up Plan Follow-up with MeHali MarryMD as planned Please bring the immunization record for your shingles vaccine and the influenza vaccine.  Medicare wellness visit in one year. Patient will access AVS on my chart.      Health Maintenance, Male Adopting a healthy lifestyle and getting  preventive care are important in promoting health and wellness. Ask your health care provider about: The right schedule for you to have regular tests and exams. Things you can do on your own to prevent diseases and keep yourself healthy. What should I know about diet, weight, and exercise? Eat a healthy diet  Eat a diet that includes plenty of vegetables, fruits, low-fat dairy products, and lean protein. Do not eat a lot of foods that are high in solid fats, added sugars, or sodium. Maintain a healthy weight Body mass index (BMI) is a measurement that can be used to identify possible weight problems. It estimates body fat based on height and weight. Your health care provider can help determine your BMI and help you achieve or maintain a healthy weight. Get regular exercise Get regular exercise. This is one of the most important things you can do for your health. Most adults should: Exercise for at least 150 minutes each week. The exercise should increase your heart rate and make you sweat (moderate-intensity exercise). Do strengthening exercises at least twice a week. This is in addition to the moderate-intensity exercise. Spend less time sitting. Even light physical activity can be beneficial. Watch cholesterol and blood lipids Have your blood tested for lipids and cholesterol at 73 years of age, then have this test every 5 years. You may need to have your cholesterol levels checked more often if: Your lipid or cholesterol levels are high. You are older than 73 years of age. You are at high risk for heart disease. What should I know about cancer screening? Many types of cancers can be detected early and may often be prevented. Depending on your health history and family history, you may need to have cancer screening at various ages. This may include screening for: Colorectal cancer. Prostate cancer. Skin cancer. Lung cancer. What should I know about heart disease, diabetes, and high blood  pressure? Blood pressure and heart disease High blood pressure causes heart disease and increases the risk of stroke. This is more likely to develop in people who have high blood pressure readings or are overweight. Talk with your health care provider about your target blood pressure readings. Have your blood pressure checked: Every 3-5 years if you are 2-64 years of age. Every year if you are 90 years old or older. If you are between the ages of 18 and 66 and are a current or former smoker, ask your health care provider if you should have a one-time screening for abdominal aortic aneurysm (AAA). Diabetes Have regular diabetes screenings. This checks your fasting blood sugar level. Have the screening done: Once every three years after age 90 if you are at a normal weight and have a low risk for diabetes. More often and at a younger age if you are overweight or have a high risk for diabetes. What should I know about preventing infection? Hepatitis B If you have a higher risk for hepatitis B, you should be screened for this virus. Talk with your health care provider to find out if you are at risk for hepatitis B infection. Hepatitis C Blood testing is recommended for: Everyone born from [birth year] through 1967. Anyone with known risk factors for hepatitis C. Sexually transmitted infections (STIs) You should be screened each year for STIs, including gonorrhea and chlamydia, if: You are sexually  active and are younger than 73 years of age. You are older than 73 years of age and your health care provider tells you that you are at risk for this type of infection. Your sexual activity has changed since you were last screened, and you are at increased risk for chlamydia or gonorrhea. Ask your health care provider if you are at risk. Ask your health care provider about whether you are at high risk for HIV. Your health care provider may recommend a prescription medicine to help prevent HIV infection. If you  choose to take medicine to prevent HIV, you should first get tested for HIV. You should then be tested every 3 months for as long as you are taking the medicine. Follow these instructions at home: Alcohol use Do not drink alcohol if your health care provider tells you not to drink. If you drink alcohol: Limit how much you have to 0-2 drinks a day. Know how much alcohol is in your drink. In the [place] one drink equals one 12 oz bottle of beer (355 mL), one 5 oz glass of wine (148 mL), or one 1 oz glass of hard liquor (44 mL). Lifestyle Do not use any products that contain nicotine or tobacco. These products include cigarettes, chewing tobacco, and vaping devices, such as e-cigarettes. If you need help quitting, ask your health care provider. Do not use street drugs. Do not share needles. Ask your health care provider for help if you need support or information about quitting drugs. General instructions Schedule regular health, dental, and eye exams. Stay current with your vaccines. Tell your health care provider if: You often feel depressed. You have ever been abused or do not feel safe at home. Summary Adopting a healthy lifestyle and getting preventive care are important in promoting health and wellness. Follow your health care provider's instructions about healthy diet, exercising, and getting tested or screened for diseases. Follow your health care provider's instructions on monitoring your cholesterol and blood pressure. This information is not intended to replace advice given to you by your health care provider. Make sure you discuss any questions you have with your health care provider. Document Revised: 07/12/2020 Document Reviewed: 07/12/2020 Elsevier Patient Education  [place].

## 2021-12-09 ENCOUNTER — Ambulatory Visit: Payer: Medicare Other | Attending: Cardiovascular Disease

## 2021-12-09 DIAGNOSIS — Z9889 Other specified postprocedural states: Secondary | ICD-10-CM

## 2021-12-09 DIAGNOSIS — Z7901 Long term (current) use of anticoagulants: Secondary | ICD-10-CM | POA: Diagnosis not present

## 2021-12-09 LAB — POCT INR: INR: 3.6 — AB (ref 2.0–3.0)

## 2021-12-09 NOTE — Patient Instructions (Signed)
continue taking 1 tablet daily, except  1/2 a tablet on Monday.    Stay consistent with greens each week (3 times per week).   Repeat INR in 4 weeks. [phone]

## 2022-01-06 ENCOUNTER — Ambulatory Visit: Payer: Medicare Other | Attending: Cardiology

## 2022-01-06 DIAGNOSIS — Z9889 Other specified postprocedural states: Secondary | ICD-10-CM

## 2022-01-06 DIAGNOSIS — Z7901 Long term (current) use of anticoagulants: Secondary | ICD-10-CM | POA: Diagnosis not present

## 2022-01-06 LAB — POCT INR: INR: 1.6 — AB (ref 2.0–3.0)

## 2022-01-06 NOTE — Patient Instructions (Signed)
TAKE 2 TABLETS TODAY ONLY and then  continue taking 1 tablet daily, except  1/2 a tablet on Monday.    Stay consistent with greens each week (3 times per week).   Repeat INR in 3 weeks. [phone]

## 2022-01-23 ENCOUNTER — Ambulatory Visit: Payer: Medicare Other | Attending: Cardiology

## 2022-01-23 DIAGNOSIS — Z7901 Long term (current) use of anticoagulants: Secondary | ICD-10-CM | POA: Diagnosis not present

## 2022-01-23 DIAGNOSIS — Z9889 Other specified postprocedural states: Secondary | ICD-10-CM

## 2022-01-23 LAB — POCT INR: INR: 3.3 — AB (ref 2.0–3.0)

## 2022-01-23 NOTE — Patient Instructions (Signed)
continue taking 1 tablet daily, except  1/2 a tablet on Monday.    Stay consistent with greens each week (3 times per week).   Repeat INR in 6 weeks. [phone]

## 2022-01-28 ENCOUNTER — Other Ambulatory Visit: Payer: Self-pay | Admitting: Cardiology

## 2022-01-28 DIAGNOSIS — I428 Other cardiomyopathies: Secondary | ICD-10-CM

## 2022-01-28 DIAGNOSIS — Z9889 Other specified postprocedural states: Secondary | ICD-10-CM

## 2022-01-31 NOTE — Telephone Encounter (Signed)
Prescription refill request received for warfarin Lov: 09/05/21 ([facility])  Next INR check: 03/08/22 Warfarin tablet strength: '10mg'$   Appropriate dose and refill sent to requested pharmacy.

## 2022-02-01 ENCOUNTER — Other Ambulatory Visit: Payer: Self-pay | Admitting: Cardiology

## 2022-02-01 DIAGNOSIS — Z23 Encounter for immunization: Secondary | ICD-10-CM | POA: Diagnosis not present

## 2022-02-01 DIAGNOSIS — I428 Other cardiomyopathies: Secondary | ICD-10-CM

## 2022-02-23 ENCOUNTER — Other Ambulatory Visit: Payer: Self-pay | Admitting: Cardiology

## 2022-02-23 DIAGNOSIS — I428 Other cardiomyopathies: Secondary | ICD-10-CM

## 2022-03-08 ENCOUNTER — Ambulatory Visit: Payer: Medicare Other | Attending: Cardiology

## 2022-03-08 DIAGNOSIS — Z9889 Other specified postprocedural states: Secondary | ICD-10-CM | POA: Diagnosis not present

## 2022-03-08 DIAGNOSIS — Z7901 Long term (current) use of anticoagulants: Secondary | ICD-10-CM | POA: Diagnosis not present

## 2022-03-08 LAB — POCT INR: INR: 3 (ref 2.0–3.0)

## 2022-03-08 NOTE — Patient Instructions (Signed)
Description   Continue taking 1 tablet daily, except  1/2 a tablet on Monday.    Stay consistent with greens each week (3 times per week).   Repeat INR in 7 weeks. [phone]

## 2022-03-10 ENCOUNTER — Encounter: Payer: Self-pay | Admitting: Family Medicine

## 2022-03-10 ENCOUNTER — Ambulatory Visit (INDEPENDENT_AMBULATORY_CARE_PROVIDER_SITE_OTHER): Payer: Medicare Other | Admitting: Family Medicine

## 2022-03-10 VITALS — BP 117/81 | HR 73 | Ht 76.0 in | Wt 203.0 lb

## 2022-03-10 DIAGNOSIS — R202 Paresthesia of skin: Secondary | ICD-10-CM

## 2022-03-10 DIAGNOSIS — R21 Rash and other nonspecific skin eruption: Secondary | ICD-10-CM | POA: Diagnosis not present

## 2022-03-10 DIAGNOSIS — I10 Essential (primary) hypertension: Secondary | ICD-10-CM

## 2022-03-10 DIAGNOSIS — R7301 Impaired fasting glucose: Secondary | ICD-10-CM

## 2022-03-10 DIAGNOSIS — E7800 Pure hypercholesterolemia, unspecified: Secondary | ICD-10-CM | POA: Diagnosis not present

## 2022-03-10 DIAGNOSIS — D649 Anemia, unspecified: Secondary | ICD-10-CM | POA: Diagnosis not present

## 2022-03-10 DIAGNOSIS — J4530 Mild persistent asthma, uncomplicated: Secondary | ICD-10-CM

## 2022-03-10 DIAGNOSIS — Z125 Encounter for screening for malignant neoplasm of prostate: Secondary | ICD-10-CM

## 2022-03-10 DIAGNOSIS — N401 Enlarged prostate with lower urinary tract symptoms: Secondary | ICD-10-CM | POA: Diagnosis not present

## 2022-03-10 MED ORDER — TRIAMCINOLONE ACETONIDE 0.1 % EX CREA: 1.0000 | TOPICAL_CREAM | Freq: Two times a day (BID) | CUTANEOUS | 2 refills | Status: DC

## 2022-03-10 NOTE — Assessment & Plan Note (Signed)
Due to recheck lipids. 

## 2022-03-10 NOTE — Assessment & Plan Note (Signed)
Ports he is actually doing well no recent flares or exacerbations he still gets a little short of breath with stairs but says otherwise if he is walking on flat ground he can typically walk for 25 to 30 minutes and does daily for exercise.

## 2022-03-10 NOTE — Assessment & Plan Note (Signed)
Due to recheck A1c.

## 2022-03-10 NOTE — Progress Notes (Signed)
Established Patient Office Visit  Subjective   Patient ID: Corey Mathis, male    DOB: July 02, 1950  Age: 72 y.o. MRN: [id number]  Chief Complaint  Patient presents with   Hypertension    HPI  Hypertension- Pt denies chest pain, SOB, dizziness, or heart palpitations.  Taking meds as directed w/o problems.  Denies medication side effects.    Impaired fasting glucose-no increased thirst or urination. No symptoms consistent with hypoglycemia.  Did have some teeth extracted since I last saw him.  He would really like to get implants but it is quite expensive.  He also reports that he gets a little crawly sensation on the top of his right foot just below the ankle.  It comes and goes he notices it more when he is been sitting for a while typically if he just changes position with his back he can usually get relief and it will go away.  It can happen when he is wearing shoes or not wearing shoes.    ROS    Objective:     BP 117/81   Pulse 73   Ht '6\' 4"'$  (1.93 m)   Wt 203 lb (92.1 kg)   SpO2 96%   BMI 24.71 kg/m    Physical Exam Constitutional:      Appearance: He is well-developed.  HENT:     Head: Normocephalic and atraumatic.  Cardiovascular:     Rate and Rhythm: Normal rate and regular rhythm.     Heart sounds: Normal heart sounds.  Pulmonary:     Effort: Pulmonary effort is normal.     Breath sounds: Normal breath sounds.  Skin:    General: Skin is warm and dry.     Comments: He has a hyperpigmented horizontal rash on the right side of his neck he also has a smaller area on the posterior left side of his neck and on his upper back he has a hyperpigmented area that also looks very dry with some fine scale.  No open wounds or drainage.  He says it is not itchy or irritating.  Neurological:     Mental Status: He is alert and oriented to person, place, and time.  Psychiatric:        Behavior: Behavior normal.     No results found for any visits on  03/10/22.    The ASCVD Risk score (Arnett DK, et al., 2019) failed to calculate for the following reasons:   The valid total cholesterol range is 130 to 320 mg/dL    Assessment & Plan:   Problem List Items Addressed This Visit       Cardiovascular and Mediastinum   Essential hypertension - Primary    Well controlled. Continue current regimen. Follow up in  77mo      Relevant Orders   PSA   Lipid panel   HgB A1c   COMPLETE METABOLIC PANEL WITH GFR   CBC     Respiratory   Asthma    Ports he is actually doing well no recent flares or exacerbations he still gets a little short of breath with stairs but says otherwise if he is walking on flat ground he can typically walk for 25 to 30 minutes and does daily for exercise.        Endocrine   IFG (impaired fasting glucose)    Due to recheck A1c.      Relevant Orders   PSA   Lipid panel   HgB A1c  COMPLETE METABOLIC PANEL WITH GFR   CBC     Genitourinary   BPH (benign prostatic hyperplasia)   Relevant Orders   PSA   Lipid panel   HgB A1c   COMPLETE METABOLIC PANEL WITH GFR   CBC     Other   Hyperlipidemia    Due to recheck lipids.      Relevant Orders   PSA   Lipid panel   HgB A1c   COMPLETE METABOLIC PANEL WITH GFR   CBC   Other Visit Diagnoses     Rash       Relevant Medications   triamcinolone cream (KENALOG) 0.1 %   Screening for prostate cancer       Relevant Orders   PSA   Paresthesia          Rash-unclear etiology most suspicious of a contact dermatitis based on the distribution of the rash.  He says it is really not itchy or bothersome but working to try topical steroid cream on it for 2 weeks to see if it resolves.  Just encouraged him to be aware of things that might be touching his neck or upper back during the day that could be causing some irritation or skin reaction or inflammation.  He will come back if the rash does not improve on its own.  Paresthesia-on the top of the right foot  seems to come and go with position change of his back.  We discussed that if it comes more persistent or affects more portion of his foot or if he is noticing weakness in the foot to please let me know.  Return in about 6 months (around 09/08/2022) for Hypertension, Prediabetes.    Beatrice Lecher, MD

## 2022-03-10 NOTE — Assessment & Plan Note (Signed)
Well controlled. Continue current regimen. Follow up in  6 mo  

## 2022-03-13 NOTE — Progress Notes (Signed)
HI Corey Mathis,  LDL cholesterol looks much better.  Great work!  A1c is 5.8 in the prediabetes range but stable.  Hemoglobin has dropped a little bit back to 11.7.  Will see if we can call the lab and add an iron panel to see if your iron may be low again.  Metabolic panel and prostate test are normal.

## 2022-03-15 ENCOUNTER — Other Ambulatory Visit: Payer: Self-pay | Admitting: *Deleted

## 2022-03-15 DIAGNOSIS — R790 Abnormal level of blood mineral: Secondary | ICD-10-CM

## 2022-03-15 LAB — COMPLETE METABOLIC PANEL WITH GFR
AG Ratio: 1.6 (calc) (ref 1.0–2.5)
ALT: 15 U/L (ref 9–46)
AST: 27 U/L (ref 10–35)
Albumin: 4.2 g/dL (ref 3.6–5.1)
Alkaline phosphatase (APISO): 79 U/L (ref 35–144)
BUN: 9 mg/dL (ref 7–25)
CO2: 27 mmol/L (ref 20–32)
Calcium: 9.2 mg/dL (ref 8.6–10.3)
Chloride: 102 mmol/L (ref 98–110)
Creat: 0.89 mg/dL (ref 0.70–1.28)
Globulin: 2.7 g/dL (calc) (ref 1.9–3.7)
Glucose, Bld: 92 mg/dL (ref 65–99)
Potassium: 4.4 mmol/L (ref 3.5–5.3)
Sodium: 138 mmol/L (ref 135–146)
Total Bilirubin: 0.7 mg/dL (ref 0.2–1.2)
Total Protein: 6.9 g/dL (ref 6.1–8.1)
eGFR: 90 mL/min/{1.73_m2} (ref >=60)

## 2022-03-15 LAB — CBC
HCT: 36.3 % — ABNORMAL LOW (ref 38.5–50.0)
Hemoglobin: 11.7 g/dL — ABNORMAL LOW (ref 13.2–17.1)
MCH: 23.9 pg — ABNORMAL LOW (ref 27.0–33.0)
MCHC: 32.2 g/dL (ref 32.0–36.0)
MCV: 74.2 fL — ABNORMAL LOW (ref 80.0–100.0)
MPV: 11 fL (ref 7.5–12.5)
Platelets: 164 10*3/uL (ref 140–400)
RBC: 4.89 10*6/uL (ref 4.20–5.80)
RDW: 19.5 % — ABNORMAL HIGH (ref 11.0–15.0)
WBC: 4.2 10*3/uL (ref 3.8–10.8)

## 2022-03-15 LAB — IRON,TIBC AND FERRITIN PANEL
%SAT: 13 % (calc) — ABNORMAL LOW (ref 20–48)
Ferritin: 14 ng/mL — ABNORMAL LOW (ref 24–380)
Iron: 55 ug/dL (ref 50–180)
TIBC: 438 mcg/dL (calc) — ABNORMAL HIGH (ref 250–425)

## 2022-03-15 LAB — LIPID PANEL
Cholesterol: 124 mg/dL (ref <200)
HDL: 37 mg/dL — ABNORMAL LOW (ref >=40)
LDL Cholesterol (Calc): 70 mg/dL (calc)
Non-HDL Cholesterol (Calc): 87 mg/dL (calc) (ref <130)
Total CHOL/HDL Ratio: 3.4 (calc) (ref <5.0)
Triglycerides: 87 mg/dL (ref <150)

## 2022-03-15 LAB — HEMOGLOBIN A1C
Hgb A1c MFr Bld: 5.8 % of total Hgb — ABNORMAL HIGH (ref <5.7)
Mean Plasma Glucose: 120 mg/dL
eAG (mmol/L): 6.6 mmol/L

## 2022-03-15 LAB — PSA: PSA: 1.91 ng/mL (ref <=4.00)

## 2022-03-15 NOTE — Progress Notes (Signed)
Corey Mathis, your iron stores are low.  I know you just had a colonoscopy this past year.  Just encourage you to really work on increasing your iron in your diet with iron rich foods. This includes spinach, broccoli shellfish, tofu, quinoa etc.  You can also consider taking an over-the-counter iron supplement.  Plan will be to recheck your iron levels in about 2 months to make sure that they are improving.

## 2022-03-30 ENCOUNTER — Other Ambulatory Visit: Payer: Self-pay | Admitting: Family Medicine

## 2022-03-30 DIAGNOSIS — F411 Generalized anxiety disorder: Secondary | ICD-10-CM

## 2022-04-26 ENCOUNTER — Ambulatory Visit: Payer: Medicare Other | Attending: Cardiology

## 2022-04-26 DIAGNOSIS — Z9889 Other specified postprocedural states: Secondary | ICD-10-CM | POA: Insufficient documentation

## 2022-04-26 DIAGNOSIS — Z7901 Long term (current) use of anticoagulants: Secondary | ICD-10-CM

## 2022-04-26 LAB — POCT INR: INR: 4.2 — AB (ref 2.0–3.0)

## 2022-04-26 NOTE — Patient Instructions (Signed)
Description   Hold today's dose and then Continue taking 1 tablet daily, except  1/2 a tablet on Monday.    Stay consistent with greens each week (3 times per week).   Repeat INR in 5 weeks.  [facility] [phone]

## 2022-04-29 ENCOUNTER — Other Ambulatory Visit: Payer: Self-pay | Admitting: Cardiology

## 2022-05-05 ENCOUNTER — Ambulatory Visit
Admission: EM | Admit: 2022-05-05 | Discharge: 2022-05-05 | Disposition: A | Discharge status: Home / Self Care | Payer: Medicare Other | Attending: Family Medicine | Admitting: Family Medicine

## 2022-05-05 DIAGNOSIS — J4530 Mild persistent asthma, uncomplicated: Secondary | ICD-10-CM | POA: Diagnosis not present

## 2022-05-05 DIAGNOSIS — J209 Acute bronchitis, unspecified: Secondary | ICD-10-CM | POA: Diagnosis not present

## 2022-05-05 DIAGNOSIS — R0981 Nasal congestion: Secondary | ICD-10-CM | POA: Diagnosis present

## 2022-05-05 DIAGNOSIS — Z7951 Long term (current) use of inhaled steroids: Secondary | ICD-10-CM | POA: Insufficient documentation

## 2022-05-05 DIAGNOSIS — Z1152 Encounter for screening for COVID-19: Secondary | ICD-10-CM | POA: Diagnosis not present

## 2022-05-05 DIAGNOSIS — R059 Cough, unspecified: Secondary | ICD-10-CM | POA: Diagnosis present

## 2022-05-05 MED ORDER — CORICIDIN HBP CONGESTION/COUGH 10-200 MG PO CAPS
1.0000 | ORAL_CAPSULE | Freq: Three times a day (TID) | ORAL | 0 refills | Status: AC
Start: 2022-05-05 — End: 2022-05-12

## 2022-05-05 MED ORDER — METHYLPREDNISOLONE 4 MG PO TBPK: ORAL_TABLET | ORAL | 0 refills | Status: AC

## 2022-05-05 NOTE — Discharge Instructions (Addendum)
Your right ear has been cleared of a cerumen impaction.  Your COVID is pending. Your results will show in your MyChart. Any positive results will result in a phone call from a nurse with next steps in treatment and recommendations.  You have been diagnosed with bronchitis.  You have been prescribed Medrol Dosepak for the bronchitis. Discontinue the Mucinex.  Start Coricidin HBP every 8 hours for 7 days.

## 2022-05-05 NOTE — ED Triage Notes (Signed)
Patient with c/o cough and congestion x4 days. Patient states he has tried OTC mucinex and alka-seltzer cold meds. Patient reports coughing up yellow phlegm.

## 2022-05-05 NOTE — ED Provider Notes (Signed)
[facility]    CSN: [id number] Arrival date & time: 05/05/22  1248      History   Chief Complaint Chief Complaint  Patient presents with   Cough   Nasal Congestion    HPI Corey Mathis is a 72 y.o. male.   HPI He is in today for cough and nasal congestion for 4 days. He endorses some yellow phlegm. He is concern because of his asthma. He is drinking and eating without difficulty. He was at a funeral. He has also endorses being outside and getting sweaty. Exposure unknown COVID/Influenza/Strep. Denies fever, chills, sore throat, loss of smell or taste, shortness of breath, chest pain, nausea, or diarrhea. The current treatment has been OTC Mucinex D/DM and Alka-seltzer.   Past Medical History:  Diagnosis Date   Allergy    Anxiety    Asthma    CHF (congestive heart failure) ([place])    Dyslipidemia    History of mitral valve replacement    Hypertension    Labyrinthitis    hx    Patient Active Problem List   Diagnosis Date Noted   Hx of adenomatous colonic polyps 03/26/2021   Mass of breast, left 11/15/2018   Iron deficiency anemia 03/19/2018   Tubular adenoma of colon 03/19/2018   Anticoagulated on Coumadin 03/19/2018   Status post colonoscopy with polypectomy 02/11/2018   Hematochezia    History of colonic polyps 01/17/2018   Family history of colon cancer 01/17/2018   Left knee pain 10/09/2017   Hemothorax    H/O mitral valve replacement with mechanical valve    NICM (nonischemic cardiomyopathy) ([place])    Bruit 01/08/2014   IFG (impaired fasting glucose) 09/23/2013   Lung nodule 03/31/2013   BPH (benign prostatic hyperplasia) 09/27/2012   Lipoma of back 04/26/2011   Chronic anticoagulation 01/25/2011   Hyperlipidemia 06/03/2007   Anxiety state 06/03/2007   Essential hypertension 06/03/2007   Asthma 06/03/2007   ALLERGY 06/03/2007   LABYRINTHITIS, HX OF 06/03/2007   MITRAL VALVE REPLACEMENT, HX OF 06/03/2007    Past Surgical History:   Procedure Laterality Date   ACNE CYST REMOVAL     from hand and back   COLONOSCOPY WITH PROPOFOL N/A 04/21/2021   Procedure: COLONOSCOPY WITH PROPOFOL;  Surgeon: Irving Copas., MD;  Location[facility] ENDOSCOPY;  Service: Gastroenterology;  Laterality: N/A;   EXTERNAL FIXATION LEG Right 08/11/2017   Procedure: EXTERNAL FIXATION, RIGHT KNEE;  Surgeon: Leandrew Koyanagi, MD;  Location: [place];  Service: Orthopedics;  Laterality: Right;   FINE NEEDLE ASPIRATION Right 08/11/2017   Procedure: FINE NEEDLE ASPIRATION  of right Knee with 80 cc of dark red fluid removed;  Surgeon: Leandrew Koyanagi, MD;  Location: [place];  Service: Orthopedics;  Laterality: Right;   FLEXIBLE SIGMOIDOSCOPY N/A 02/11/2018   Procedure: FLEXIBLE SIGMOIDOSCOPY;  Surgeon: Doran Stabler, MD;  Location: [place];  Service: Gastroenterology;  Laterality: N/A;   Jaw surgery (other)     NASAL SINUS SURGERY N/A 10/16/2012   Procedure: NASAL ENDOSCOPIC POLYPECTOMY/MAXILLARY ANTROSTOMY/ETHMOIDECTOMY;  Surgeon: Izora Gala, MD;  Location: [facility] OR;  Service: ENT;  Laterality: N/A;   ORIF TIBIA PLATEAU Left 08/13/2017   Procedure: OPEN REDUCTION INTERNAL FIXATION (ORIF) TIBIAL PLATEAU;  Surgeon: Leandrew Koyanagi, MD;  Location: [place];  Service: Orthopedics;  Laterality: Left;   ORIF TIBIA PLATEAU Right 08/22/2017   Procedure: OPEN REDUCTION INTERNAL FIXATION (ORIF) RIGHT BICONDYLAR TIBIAL PLATEAU, REMOVAL OF EX FIX;  Surgeon: Leandrew Koyanagi, MD;  Location: [place];  Service: Orthopedics;  Laterality: Right;   POLYPECTOMY  04/21/2021   Procedure: POLYPECTOMY;  Surgeon: Mansouraty, Telford Nab., MD;  Location: [place];  Service: Gastroenterology;;   RIGHT/LEFT HEART CATH AND CORONARY ANGIOGRAPHY N/A 07/12/2017   Procedure: RIGHT/LEFT HEART CATH AND CORONARY ANGIOGRAPHY;  Surgeon: Burnell Blanks, MD;  Location: [place] [facility];  Service: Cardiovascular;  Laterality: N/A;   SUBMUCOSAL INJECTION  02/11/2018   Procedure: SUBMUCOSAL  INJECTION;  Surgeon: Doran Stabler, MD;  Location: [place];  Service: Gastroenterology;;   TOOTH EXTRACTION     VALVE REPLACEMENT  1998   [facility] [facility], mitral       Home Medications    Prior to Admission medications   Medication Sig Start Date End Date Taking? Authorizing Provider  Dextromethorphan-guaiFENesin (CORICIDIN HBP CONGESTION/COUGH) 10-200 MG CAPS Take 1 capsule by mouth in the morning, at noon, and at bedtime for 7 days. 05/05/22 05/12/22 Yes Vevelyn Francois, NP  methylPREDNISolone (MEDROL) 4 MG TBPK tablet Follow package instructions. 05/05/22 05/11/22 Yes Vevelyn Francois, NP  albuterol (PROAIR HFA) 108 (90 Base) MCG/ACT inhaler Inhale 2 puffs into the lungs every 6 (six) hours as needed for wheezing. 12/06/16 09/07/24  [name], MD  antiseptic oral rinse (BIOTENE) LIQD 5 mLs by Mouth Rinse route as needed for dry mouth or mouth pain.    [provider]  aspirin 81 MG EC tablet Take 81 mg by mouth at bedtime.    [provider]  atorvastatin (LIPITOR) 40 MG tablet Take 1 tablet by mouth once daily 08/22/21   Lelon Perla, MD  fluticasone [facility]) 50 MCG/ACT nasal spray Place 1 spray into both nostrils daily as needed (congestion). 07/25/19   Hall-Potvin, [place], PA-C  fluticasone-salmeterol (ADVAIR) 250-50 MCG/ACT AEPB INHALE ONE PUFF BY MOUTH TWICE A DAY 11/16/21   [name], MD  hydrALAZINE (APRESOLINE) 50 MG tablet TAKE 1 TABLET BY MOUTH THREE TIMES DAILY 10/24/21   Lelon Perla, MD  isosorbide mononitrate (IMDUR) 60 MG 24 hr tablet Take 1 tablet (60 mg total) by mouth daily. 02/24/22   Lelon Perla, MD  metoprolol succinate (TOPROL-XL) 100 MG 24 hr tablet TAKE 1 TABLET BY MOUTH ONCE DAILY. APPOINTMENT REQUIRED FOR FUTURE REFILLS 05/01/22   Lelon Perla, MD  metoprolol succinate (TOPROL-XL) 50 MG 24 hr tablet TAKE 1 TABLET BY MOUTH ONCE DAILY AT BEDTIME WITH 100 MG TABLET FOR A TOTAL OF 150 MG 02/01/22   Lelon Perla,  MD  Metoprolol Succinate 50 MG CS24 Take 50 mg by mouth daily.    [provider]  Multiple Vitamin (MULTIVITAMIN WITH MINERALS) TABS tablet Take 1 tablet by mouth at bedtime.    [provider]  PARoxetine (PAXIL) 30 MG tablet Take 1 tablet by mouth once daily 03/30/22   [name], MD  triamcinolone cream (KENALOG) 0.1 % Apply 1 Application topically 2 (two) times daily. 03/10/22   [name], MD  warfarin (COUMADIN) 10 MG tablet TAKE 1/2 TABLET TO 1 TABLET (ONE-HALF TO ONE) BY MOUTH ONCE DAILY AS  DIRECTED BY THE  COUMADIN CLINIC 01/31/22   Lelon Perla, MD    Family History Family History  Problem Relation Age of Onset   Heart attack Father 54   Colon cancer Mother 37   Hypertension Mother    Breast cancer Maternal Aunt    Stomach cancer Neg Hx    Esophageal cancer Neg Hx  Inflammatory bowel disease Neg Hx    Liver disease Neg Hx    Pancreatic cancer Neg Hx     Social History Social History   Tobacco Use   Smoking status: Never   Smokeless tobacco: Never  Vaping Use   Vaping Use: Never used  Substance Use Topics   Alcohol use: Never   Drug use: Never     Allergies   Lisinopril, Carvedilol, Ezetimibe-simvastatin, Propranolol hcl, Ramipril, and Codeine   Review of Systems Review of Systems   Physical Exam Triage Vital Signs ED Triage Vitals  Enc Vitals Group     BP 05/05/22 1431 (!) 173/111     Pulse Rate 05/05/22 1431 70     Resp 05/05/22 1431 16     Temp 05/05/22 1431 98.5 F (36.9 C)     Temp Source 05/05/22 1431 Oral     SpO2 05/05/22 1431 95 %     Weight --      Height --      Head Circumference --      Peak Flow --      Pain Score 05/05/22 1432 0     Pain Loc --      Pain Edu? --      Excl. in [place]? --    No data found.  Updated Vital Signs BP (!) 173/111 (BP Location: Left Arm)   Pulse 70   Temp 98.5 F (36.9 C) (Oral)   Resp 16   SpO2 95%   Visual Acuity Right Eye Distance:   Left Eye  Distance:   Bilateral Distance:    Right Eye Near:   Left Eye Near:    Bilateral Near:     Physical Exam Constitutional:      General: He is not in acute distress.    Appearance: He is normal weight. He is not ill-appearing, toxic-appearing or diaphoretic.  HENT:     Head: Normocephalic and atraumatic.     Right Ear: There is impacted cerumen.     Left Ear: Tympanic membrane normal.     Nose: Nose normal.  Cardiovascular:     Rate and Rhythm: Normal rate and regular rhythm.     Pulses: Normal pulses.     Heart sounds: Normal heart sounds.  Pulmonary:     Effort: Pulmonary effort is normal.     Breath sounds: Normal breath sounds.     Comments: Diminished no wheezing Musculoskeletal:     Cervical back: Normal range of motion.  Skin:    General: Skin is warm.     Capillary Refill: Capillary refill takes less than 2 seconds.  Neurological:     General: No focal deficit present.     Mental Status: He is alert and oriented to person, place, and time.  Psychiatric:        Mood and Affect: Mood normal.        Behavior: Behavior normal.      UC Treatments / Results  Labs (all labs ordered are listed, but only abnormal results are displayed) Labs Reviewed  SARS CORONAVIRUS 2 (TAT 6-24 HRS)    EKG   Radiology No results found.  Procedures Procedures (including critical care time)  Medications Ordered in UC Medications - No data to display  Initial Impression / Assessment and Plan / UC Course  I have reviewed the triage vital signs and the nursing notes.  Pertinent labs & imaging results that were available during my care of the patient were reviewed by  me and considered in my medical decision making (see chart for details).    Cough   Final Clinical Impressions(s) / UC Diagnoses   Final diagnoses:  Acute bronchitis, unspecified organism  Mild persistent asthma without complication     Discharge Instructions      Your right ear has been cleared of a  cerumen impaction.  Your COVID is pending. Your results will show in your MyChart. Any positive results will result in a phone call from a nurse with next steps in treatment and recommendations.  You have been diagnosed with bronchitis.  You have been prescribed Medrol Dosepak for the bronchitis. Discontinue the Mucinex.  Start Coricidin HBP every 8 hours for 7 days.        ED Prescriptions     Medication Sig Dispense Auth. Provider   methylPREDNISolone (MEDROL) 4 MG TBPK tablet Follow package instructions. 21 tablet Dionisio David M, NP   Dextromethorphan-guaiFENesin (CORICIDIN HBP CONGESTION/COUGH) 10-200 MG CAPS Take 1 capsule by mouth in the morning, at noon, and at bedtime for 7 days. 20 capsule Vevelyn Francois, NP      PDMP not reviewed this encounter.   Dionisio David [place], [place] 05/05/22 [phone]

## 2022-05-06 LAB — SARS CORONAVIRUS 2 (TAT 6-24 HRS): SARS Coronavirus 2: NEGATIVE

## 2022-05-18 ENCOUNTER — Other Ambulatory Visit: Payer: Self-pay | Admitting: Cardiology

## 2022-05-18 DIAGNOSIS — Z9889 Other specified postprocedural states: Secondary | ICD-10-CM

## 2022-05-19 NOTE — Telephone Encounter (Signed)
Warfarin 10mg   MITRAL VALVE REPLACEMENT  Last INR 04/26/22 Last OV 09/05/21

## 2022-05-31 ENCOUNTER — Ambulatory Visit: Payer: Medicare Other

## 2022-06-06 ENCOUNTER — Ambulatory Visit: Payer: Medicare Other | Attending: Cardiology

## 2022-06-06 DIAGNOSIS — Z9889 Other specified postprocedural states: Secondary | ICD-10-CM | POA: Insufficient documentation

## 2022-06-06 DIAGNOSIS — Z7901 Long term (current) use of anticoagulants: Secondary | ICD-10-CM | POA: Insufficient documentation

## 2022-06-06 DIAGNOSIS — Z23 Encounter for immunization: Secondary | ICD-10-CM | POA: Diagnosis not present

## 2022-06-06 LAB — POCT INR: INR: 2.1 (ref 2.0–3.0)

## 2022-06-06 NOTE — Patient Instructions (Signed)
Description   Take 1.5 tablets today, then resume same dosage of Warfarin 1 tablet daily, except 1/2 tablet on Mondays.    Stay consistent with greens each week (3 times per week).   Repeat INR in 3 weeks.  [facility]in [facility] [phone]

## 2022-06-15 ENCOUNTER — Other Ambulatory Visit: Payer: Self-pay | Admitting: Cardiology

## 2022-06-15 DIAGNOSIS — I428 Other cardiomyopathies: Secondary | ICD-10-CM

## 2022-06-27 ENCOUNTER — Ambulatory Visit: Payer: Medicare Other | Attending: Cardiology

## 2022-06-27 ENCOUNTER — Other Ambulatory Visit: Payer: Self-pay | Admitting: Family Medicine

## 2022-06-27 DIAGNOSIS — Z7901 Long term (current) use of anticoagulants: Secondary | ICD-10-CM | POA: Diagnosis not present

## 2022-06-27 DIAGNOSIS — Z9889 Other specified postprocedural states: Secondary | ICD-10-CM | POA: Insufficient documentation

## 2022-06-27 DIAGNOSIS — F411 Generalized anxiety disorder: Secondary | ICD-10-CM

## 2022-06-27 LAB — POCT INR: INR: 2.5 (ref 2.0–3.0)

## 2022-06-27 NOTE — Patient Instructions (Signed)
Description   Continue on same dosage of Warfarin 1 tablet daily, except 1/2 tablet on Mondays.    Stay consistent with greens each week (3 times per week).   Repeat INR in 4 weeks.  [facility] [phone]

## 2022-07-25 ENCOUNTER — Ambulatory Visit: Payer: Medicare Other | Attending: Cardiology | Admitting: *Deleted

## 2022-07-25 ENCOUNTER — Ambulatory Visit
Admission: EM | Admit: 2022-07-25 | Discharge: 2022-07-25 | Disposition: A | Discharge status: Home / Self Care | Payer: Medicare Other

## 2022-07-25 DIAGNOSIS — Z7901 Long term (current) use of anticoagulants: Secondary | ICD-10-CM | POA: Diagnosis not present

## 2022-07-25 DIAGNOSIS — J4541 Moderate persistent asthma with (acute) exacerbation: Secondary | ICD-10-CM | POA: Diagnosis not present

## 2022-07-25 DIAGNOSIS — Z9889 Other specified postprocedural states: Secondary | ICD-10-CM | POA: Diagnosis not present

## 2022-07-25 LAB — POCT INR: INR: 2.2 (ref 2.0–3.0)

## 2022-07-25 MED ORDER — BENZONATATE 100 MG PO CAPS: 100.0000 mg | ORAL_CAPSULE | Freq: Three times a day (TID) | ORAL | 0 refills | Status: DC

## 2022-07-25 MED ORDER — ALBUTEROL SULFATE HFA 108 (90 BASE) MCG/ACT IN AERS: 2.0000 | INHALATION_SPRAY | Freq: Four times a day (QID) | RESPIRATORY_TRACT | 1 refills | Status: DC | PRN

## 2022-07-25 MED ORDER — IPRATROPIUM-ALBUTEROL 0.5-2.5 (3) MG/3ML IN SOLN
3.0000 mL | Freq: Once | RESPIRATORY_TRACT | Status: AC
  Administered 2022-07-25: 3 mL via RESPIRATORY_TRACT

## 2022-07-25 MED ORDER — DEXAMETHASONE SODIUM PHOSPHATE 10 MG/ML IJ SOLN
10.0000 mg | Freq: Once | INTRAMUSCULAR | Status: AC
Start: 2022-07-25 — End: 2022-07-25
  Administered 2022-07-25: 10 mg via INTRAMUSCULAR

## 2022-07-25 NOTE — Patient Instructions (Signed)
Description   Today take 1.5 tablets then continue on same dosage of Warfarin 1 tablet daily, except 1/2 tablet on Mondays.  Stay consistent with greens each week (3 times per week).   Repeat INR in 3 weeks (prior to going out of town).  [facility]in Clinic [phone]

## 2022-07-25 NOTE — ED Triage Notes (Signed)
"  I have been SOB/Wheezing a few weeks now". This started "a little" a few wks ago when out mowing yard and working outside "but not improving". No fever. Some "post nasal drip" and "Cough".

## 2022-07-25 NOTE — ED Provider Notes (Signed)
[facility]    CSN: [id number] Arrival date & time: 07/25/22  1814      History   Chief Complaint Chief Complaint  Patient presents with   Shortness of Breath    Asthma Exacerbation due to Allergens    HPI Corey Mathis is a 72 y.o. male.   Patient presents to urgent care for evaluation of cough, shortness of breath, wheezing, and chest tightness over the last 1 to 2 weeks.  He states his symptoms started as postnasal drainage and dry cough a couple of weeks ago after he was working in the yard and he suspected his symptoms may be due to allergies.  He has been taking allergy medications and over-the-counter cough medications without much relief of symptoms over the last couple of weeks.  He states symptoms worsened approximately 5 to 7 days ago.  He has a history of asthma and CHF.  States he has been using his albuterol inhaler frequently over the last few days and actually ran out of his rescue inhaler today.  He has also been using Advair inhaler as prescribed without missed doses for maintenance.  Denies recent antibiotic/steroid use.  History of mitral valve replacement and therefore on warfarin.  Denies abnormal bleeding, heart palpitations, dizziness, recent fall/trauma/injuries to the head or chest, leg swelling, orthopnea, and and/V/D.  No sore throat, ear pain, nasal congestion, or headache but does report rhinorrhea that is clear.  He is a former smoker and quit 30 years ago, denies drug use.  No recent known sick contacts with similar symptoms.   Shortness of Breath   Past Medical History:  Diagnosis Date   Allergy    Anxiety    Asthma    CHF (congestive heart failure) (HCC)    Dyslipidemia    History of mitral valve replacement    Hypertension    Labyrinthitis    hx    Patient Active Problem List   Diagnosis Date Noted   Hx of adenomatous colonic polyps 03/26/2021   Mass of breast, left 11/15/2018   Iron deficiency anemia 03/19/2018   Tubular  adenoma of colon 03/19/2018   Anticoagulated on Coumadin 03/19/2018   Status post colonoscopy with polypectomy 02/11/2018   Hematochezia    History of colonic polyps 01/17/2018   Family history of colon cancer 01/17/2018   Left knee pain 10/09/2017   Hemothorax    H/O mitral valve replacement with mechanical valve    NICM (nonischemic cardiomyopathy) (HCC)    Bruit 01/08/2014   IFG (impaired fasting glucose) 09/23/2013   Lung nodule 03/31/2013   BPH (benign prostatic hyperplasia) 09/27/2012   Lipoma of back 04/26/2011   Chronic anticoagulation 01/25/2011   Hyperlipidemia 06/03/2007   Anxiety state 06/03/2007   Essential hypertension 06/03/2007   Asthma 06/03/2007   ALLERGY 06/03/2007   LABYRINTHITIS, HX OF 06/03/2007   MITRAL VALVE REPLACEMENT, HX OF 06/03/2007    Past Surgical History:  Procedure Laterality Date   ACNE CYST REMOVAL     from hand and back   COLONOSCOPY WITH PROPOFOL N/A 04/21/2021   Procedure: COLONOSCOPY WITH PROPOFOL;  Surgeon: Lemar Lofty., MD;  Location: [facility] ENDOSCOPY;  Service: Gastroenterology;  Laterality: N/A;   EXTERNAL FIXATION LEG Right 08/11/2017   Procedure: EXTERNAL FIXATION, RIGHT KNEE;  Surgeon: Tarry Kos, MD;  Location: [facility] OR;  Service: Orthopedics;  Laterality: Right;   FINE NEEDLE ASPIRATION Right 08/11/2017   Procedure: FINE NEEDLE ASPIRATION  of right Knee with 80 cc of  dark red fluid removed;  Surgeon: Tarry Kos, MD;  Location: [facility] OR;  Service: Orthopedics;  Laterality: Right;   FLEXIBLE SIGMOIDOSCOPY N/A 02/11/2018   Procedure: FLEXIBLE SIGMOIDOSCOPY;  Surgeon: Sherrilyn Rist, MD;  Location: [facility] ENDOSCOPY;  Service: Gastroenterology;  Laterality: N/A;   Jaw surgery (other)     NASAL SINUS SURGERY N/A 10/16/2012   Procedure: NASAL ENDOSCOPIC POLYPECTOMY/MAXILLARY ANTROSTOMY/ETHMOIDECTOMY;  Surgeon: Serena Colonel, MD;  Location: [facility] OR;  Service: ENT;  Laterality: N/A;   ORIF TIBIA PLATEAU Left 08/13/2017   Procedure: OPEN  REDUCTION INTERNAL FIXATION (ORIF) TIBIAL PLATEAU;  Surgeon: Tarry Kos, MD;  Location: [facility] OR;  Service: Orthopedics;  Laterality: Left;   ORIF TIBIA PLATEAU Right 08/22/2017   Procedure: OPEN REDUCTION INTERNAL FIXATION (ORIF) RIGHT BICONDYLAR TIBIAL PLATEAU, REMOVAL OF EX FIX;  Surgeon: Tarry Kos, MD;  Location: [facility] OR;  Service: Orthopedics;  Laterality: Right;   POLYPECTOMY  04/21/2021   Procedure: POLYPECTOMY;  Surgeon: Mansouraty, Netty Starring., MD;  Location: [facility] ENDOSCOPY;  Service: Gastroenterology;;   RIGHT/LEFT HEART CATH AND CORONARY ANGIOGRAPHY N/A 07/12/2017   Procedure: RIGHT/LEFT HEART CATH AND CORONARY ANGIOGRAPHY;  Surgeon: Kathleene Hazel, MD;  Location: [facility] INVASIVE CV LAB;  Service: Cardiovascular;  Laterality: N/A;   SUBMUCOSAL INJECTION  02/11/2018   Procedure: SUBMUCOSAL INJECTION;  Surgeon: Sherrilyn Rist, MD;  Location: [facility] ENDOSCOPY;  Service: Gastroenterology;;   TOOTH EXTRACTION     VALVE REPLACEMENT  1998   [employer], mitral       Home Medications    Prior to Admission medications   Medication Sig Start Date End Date Taking? Authorizing Provider  albuterol (VENTOLIN HFA) 108 (90 Base) MCG/ACT inhaler Inhale 2 puffs into the lungs every 6 (six) hours as needed for wheezing or shortness of breath. 07/25/22  Yes Carlisle Beers, FNP  aspirin 81 MG EC tablet Take 81 mg by mouth at bedtime.   Yes [provider]  atorvastatin (LIPITOR) 40 MG tablet Take 1 tablet by mouth once daily 08/22/21  Yes Crenshaw, Madolyn Frieze, MD  benzonatate (TESSALON) 100 MG capsule Take 1 capsule (100 mg total) by mouth every 8 (eight) hours. 07/25/22  Yes Carlisle Beers, FNP  fluticasone (FLONASE) 50 MCG/ACT nasal spray Place 1 spray into both nostrils daily as needed (congestion). 07/25/19  Yes Hall-Potvin, [place], PA-C  fluticasone-salmeterol (ADVAIR) 250-50 MCG/ACT AEPB INHALE ONE PUFF BY MOUTH TWICE A DAY 11/16/21  Yes Agapito Games, MD  hydrALAZINE  (APRESOLINE) 50 MG tablet TAKE 1 TABLET BY MOUTH THREE TIMES DAILY 10/24/21  Yes Lewayne Bunting, MD  isosorbide mononitrate (IMDUR) 60 MG 24 hr tablet Take 1 tablet by mouth once daily 06/15/22  Yes Crenshaw, Madolyn Frieze, MD  metoprolol succinate (TOPROL-XL) 100 MG 24 hr tablet TAKE 1 TABLET BY MOUTH ONCE DAILY. APPOINTMENT REQUIRED FOR FUTURE REFILLS 05/01/22  Yes Lewayne Bunting, MD  metoprolol succinate (TOPROL-XL) 50 MG 24 hr tablet TAKE 1 TABLET BY MOUTH ONCE DAILY AT BEDTIME WITH 100 MG TABLET FOR A TOTAL OF 150 MG 02/01/22  Yes Crenshaw, Madolyn Frieze, MD  Multiple Vitamin (MULTIVITAMIN WITH MINERALS) TABS tablet Take 1 tablet by mouth at bedtime.   Yes [provider]  PARoxetine (PAXIL) 30 MG tablet Take 1 tablet by mouth once daily 06/27/22  Yes Metheney, Barbarann Ehlers, MD  UNABLE TO FIND Med Name: BP safe medication, otc, mult-symptom for cough/congestion.   Yes [provider]  warfarin (COUMADIN) 10 MG tablet  TAKE 1/2 TO 1 (ONE-HALF TO ONE) TABLET BY MOUTH ONCE DAILY AS  DIRECTED  BY  THE  COUMADIN  CLINIC Patient taking differently: TAKE 1/2 TO 1 (ONE-HALF TO ONE) TABLET BY MOUTH ONCE DAILY AS  DIRECTED  BY  THE  COUMADIN  CLINIC. I took one and one half today. 05/19/22  Yes Lewayne Bunting, MD  antiseptic oral rinse (BIOTENE) LIQD 5 mLs by Mouth Rinse route as needed for dry mouth or mouth pain.    [provider]  Metoprolol Succinate 50 MG CS24 Take 50 mg by mouth daily.    [provider]  triamcinolone cream (KENALOG) 0.1 % Apply 1 Application topically 2 (two) times daily. 03/10/22   Agapito Games, MD    Family History Family History  Problem Relation Age of Onset   Heart attack Father 49   Colon cancer Mother 48   Hypertension Mother    Breast cancer Maternal Aunt    Stomach cancer Neg Hx    Esophageal cancer Neg Hx    Inflammatory bowel disease Neg Hx    Liver disease Neg Hx    Pancreatic cancer Neg Hx     Social History Social  History   Tobacco Use   Smoking status: Never   Smokeless tobacco: Never  Vaping Use   Vaping Use: Never used  Substance Use Topics   Alcohol use: Never   Drug use: Never     Allergies   Lisinopril, Carvedilol, Ezetimibe-simvastatin, Propranolol hcl, Ramipril, and Codeine   Review of Systems Review of Systems  Respiratory:  Positive for shortness of breath.   Per HPI   Physical Exam Triage Vital Signs ED Triage Vitals  Enc Vitals Group     BP 07/25/22 1852 119/80     Pulse Rate 07/25/22 1852 71     Resp 07/25/22 1852 20     Temp 07/25/22 1852 98.2 F (36.8 C)     Temp Source 07/25/22 1852 Oral     SpO2 07/25/22 1852 92 %     Weight 07/25/22 1848 200 lb (90.7 kg)     Height 07/25/22 1848 6\' 4"  (1.93 m)     Head Circumference --      Peak Flow --      Pain Score 07/25/22 1848 0     Pain Loc --      Pain Edu? --      Excl. in GC? --    No data found.  Updated Vital Signs BP 119/80 (BP Location: Right Arm)   Pulse 71   Temp 98.2 F (36.8 C) (Oral)   Resp 20   Ht 6\' 4"  (1.93 m)   Wt 200 lb (90.7 kg)   SpO2 92%   BMI 24.34 kg/m   Visual Acuity Right Eye Distance:   Left Eye Distance:   Bilateral Distance:    Right Eye Near:   Left Eye Near:    Bilateral Near:     Physical Exam Vitals and nursing note reviewed.  Constitutional:      Appearance: He is not ill-appearing or toxic-appearing.  HENT:     Head: Normocephalic and atraumatic.     Right Ear: Hearing and external ear normal.     Left Ear: Hearing and external ear normal.     Nose: Nose normal.     Mouth/Throat:     Lips: Pink.  Eyes:     General: Lids are normal. Vision grossly intact. Gaze aligned appropriately.  Extraocular Movements: Extraocular movements intact.     Conjunctiva/sclera: Conjunctivae normal.  Cardiovascular:     Rate and Rhythm: Normal rate and regular rhythm.     Heart sounds: Normal heart sounds, S1 normal and S2 normal.  Pulmonary:     Effort: Pulmonary  effort is normal. No respiratory distress.     Breath sounds: Normal air entry. Wheezing (Diffuse expiratory low pitched wheezing heard all lung fields bilaterally) present. No decreased breath sounds, rhonchi or rales.  Musculoskeletal:     Cervical back: Neck supple.  Skin:    General: Skin is warm and dry.     Capillary Refill: Capillary refill takes less than 2 seconds.     Findings: No rash.  Neurological:     General: No focal deficit present.     Mental Status: He is alert and oriented to person, place, and time. Mental status is at baseline.     Cranial Nerves: No dysarthria or facial asymmetry.  Psychiatric:        Mood and Affect: Mood normal.        Speech: Speech normal.        Behavior: Behavior normal.        Thought Content: Thought content normal.        Judgment: Judgment normal.      UC Treatments / Results  Labs (all labs ordered are listed, but only abnormal results are displayed) Labs Reviewed - No data to display  EKG   Radiology DG Chest 2 View  Result Date: 07/26/2022 CLINICAL DATA:  Cough, shortness of breath. EXAM: CHEST - 2 VIEW COMPARISON:  June 22, 2021. FINDINGS: The heart size and mediastinal contours are within normal limits. Status post cardiac valve repair. Both lungs are clear. The visualized skeletal structures are unremarkable. IMPRESSION: No active cardiopulmonary disease. Electronically Signed   By: Lupita Raider M.D.   On: 07/26/2022 12:10    Procedures Procedures (including critical care time)  Medications Ordered in UC Medications  ipratropium-albuterol (DUONEB) 0.5-2.5 (3) MG/3ML nebulizer solution 3 mL (3 mLs Nebulization Given 07/25/22 1932)  dexamethasone (DECADRON) injection 10 mg (10 mg Intramuscular Given 07/25/22 1932)    Initial Impression / Assessment and Plan / UC Course  I have reviewed the triage vital signs and the nursing notes.  Pertinent labs & imaging results that were available during my care of the patient  were reviewed by me and considered in my medical decision making (see chart for details).   1. Moderate persistent asthma with acute exacerbation, warfarin anticoagulation Presentation is consistent with acute asthma exacerbation likely triggered by viral URI. Duo neb given in clinic with significant subjective improvement in shortness of breath and objective improvement in lung sounds afterwards. Dexamethasone 10mg  IM to reduce inflammation related to bronchitis/asthma exacerbation. Will defer oral steroid burst due to use of Warfarin. We do not have the capability to perform imaging in the urgent care tonight, so I have ordered a chest x-ray to be done outpatient tomorrow. Will call patient with abnormal results. No current indication for antibiotic, however will send in antibiotic if necessary to treat potential pneumonia on chest x-ray when this results. Otherwise, will use tessalon perles to improve cough. Continue guaifenesin to break up mucous and drink plenty of water. He is agreeable with this plan.   Discussed physical exam and available lab work findings in clinic with patient.  Counseled patient regarding appropriate use of medications and potential side effects for all medications recommended or prescribed  today. Discussed red flag signs and symptoms of worsening condition,when to call the PCP office, return to urgent care, and when to seek higher level of care in the emergency department. Patient verbalizes understanding and agreement with plan. All questions answered. Patient discharged in stable condition.    Final Clinical Impressions(s) / UC Diagnoses   Final diagnoses:  Moderate persistent asthma with acute exacerbation  Warfarin anticoagulation     Discharge Instructions      You were evaluated today for an asthma exacerbation likely triggered by a viral upper respiratory infection.   - Use albuterol every 4-6 hours as needed for cough, shortness of breath, and wheeze. - I  gave you a steroid shot in the clinic to help with inflammation and wheezing.  - Take prescribed cough medicine as directed.  If your symptoms do not improve in the next 2-3 days with interventions, please return. Please seek medical care for new or returning symptoms, such as difficulty breathing that doesn't improve with your medications, chest pain, voice changes, high fevers, confusion, or other new or worsening symptoms. Follow-up with PCP for ongoing management of asthma. I hope you feel better!      ED Prescriptions     Medication Sig Dispense Auth. Provider   albuterol (VENTOLIN HFA) 108 (90 Base) MCG/ACT inhaler Inhale 2 puffs into the lungs every 6 (six) hours as needed for wheezing or shortness of breath. 18 g Reita May M, FNP   benzonatate (TESSALON) 100 MG capsule Take 1 capsule (100 mg total) by mouth every 8 (eight) hours. 21 capsule Carlisle Beers, FNP      PDMP not reviewed this encounter.   Carlisle Beers, [place] 07/27/22 2156

## 2022-07-25 NOTE — Discharge Instructions (Addendum)
You were evaluated today for an asthma exacerbation likely triggered by a viral upper respiratory infection.   - Use albuterol every 4-6 hours as needed for cough, shortness of breath, and wheeze. - I gave you a steroid shot in the clinic to help with inflammation and wheezing.  - Take prescribed cough medicine as directed.  If your symptoms do not improve in the next 2-3 days with interventions, please return. Please seek medical care for new or returning symptoms, such as difficulty breathing that doesn't improve with your medications, chest pain, voice changes, high fevers, confusion, or other new or worsening symptoms. Follow-up with PCP for ongoing management of asthma. I hope you feel better!

## 2022-07-26 ENCOUNTER — Ambulatory Visit
Admission: RE | Admit: 2022-07-26 | Discharge: 2022-07-26 | Disposition: A | Discharge status: Home / Self Care | Payer: Medicare Other | Source: Ambulatory Visit | Attending: Internal Medicine | Admitting: Internal Medicine

## 2022-07-26 DIAGNOSIS — R059 Cough, unspecified: Secondary | ICD-10-CM | POA: Diagnosis not present

## 2022-07-26 DIAGNOSIS — R0602 Shortness of breath: Secondary | ICD-10-CM | POA: Diagnosis not present

## 2022-08-15 ENCOUNTER — Ambulatory Visit: Payer: Medicare Other | Attending: Internal Medicine | Admitting: *Deleted

## 2022-08-15 DIAGNOSIS — Z9889 Other specified postprocedural states: Secondary | ICD-10-CM | POA: Insufficient documentation

## 2022-08-15 DIAGNOSIS — Z7901 Long term (current) use of anticoagulants: Secondary | ICD-10-CM | POA: Diagnosis not present

## 2022-08-15 LAB — POCT INR: INR: 2.3 (ref 2.0–3.0)

## 2022-08-15 NOTE — Patient Instructions (Signed)
Description   Today take 1.5 tablets then START taking Warfarin 1 tablet daily. Stay consistent with greens each week (3 times per week).   Repeat INR in 3 weeks (prior to going out of town).  [facility] [phone]

## 2022-09-04 ENCOUNTER — Encounter: Payer: Self-pay | Admitting: Family Medicine

## 2022-09-04 ENCOUNTER — Ambulatory Visit (INDEPENDENT_AMBULATORY_CARE_PROVIDER_SITE_OTHER): Payer: Medicare Other | Admitting: Family Medicine

## 2022-09-04 VITALS — BP 166/83 | HR 90 | Resp 16 | Ht 76.0 in | Wt 187.2 lb

## 2022-09-04 DIAGNOSIS — E7800 Pure hypercholesterolemia, unspecified: Secondary | ICD-10-CM

## 2022-09-04 DIAGNOSIS — J40 Bronchitis, not specified as acute or chronic: Secondary | ICD-10-CM

## 2022-09-04 DIAGNOSIS — J329 Chronic sinusitis, unspecified: Secondary | ICD-10-CM | POA: Insufficient documentation

## 2022-09-04 DIAGNOSIS — R062 Wheezing: Secondary | ICD-10-CM

## 2022-09-04 MED ORDER — PREDNISONE 20 MG PO TABS: 20.0000 mg | ORAL_TABLET | Freq: Two times a day (BID) | ORAL | 0 refills | Status: DC

## 2022-09-04 MED ORDER — ATORVASTATIN CALCIUM 40 MG PO TABS: 40.0000 mg | ORAL_TABLET | Freq: Every day | ORAL | 0 refills | Status: DC

## 2022-09-04 MED ORDER — AMOXICILLIN-POT CLAVULANATE 875-125 MG PO TABS: 1.0000 | ORAL_TABLET | Freq: Two times a day (BID) | ORAL | 0 refills | Status: DC

## 2022-09-04 NOTE — Progress Notes (Unsigned)
Acute Office Visit  Subjective:     Patient ID: Corey Mathis, male    DOB: 09-Mar-1950, 72 y.o.   MRN: [id number]  Chief Complaint  Patient presents with   Shortness of Breath    Pt states he has had SOB x3wks he uses his inhaler but not clearing up    Shortness of Breath Associated symptoms include wheezing. Pertinent negatives include no chest pain, fever or headaches.   Patient is in today for wheezing, shortness of breath and coughing. Says he was treated a few weeks back at an urgent care and given steroids. Notes an increase in sputum production.  Review of Systems  Constitutional:  Negative for chills and fever.  HENT:  Positive for congestion.   Respiratory:  Positive for shortness of breath and wheezing. Negative for cough.   Cardiovascular:  Negative for chest pain.  Neurological:  Negative for headaches.        Objective:    BP (!) 166/83 (BP Location: Left Arm, Patient Position: Sitting, Cuff Size: Normal)   Pulse 90   Resp 16   Ht 6\' 4"  (1.93 m)   Wt 187 lb 4 oz (84.9 kg)   SpO2 93%   BMI 22.79 kg/m    Physical Exam Vitals and nursing note reviewed.  Constitutional:      General: He is not in acute distress.    Appearance: Normal appearance.  HENT:     Head: Normocephalic and atraumatic.     Right Ear: External ear normal.     Left Ear: External ear normal.     Nose: Nose normal.  Eyes:     Conjunctiva/sclera: Conjunctivae normal.  Cardiovascular:     Rate and Rhythm: Normal rate and regular rhythm.  Pulmonary:     Effort: Pulmonary effort is normal.     Breath sounds: Wheezing present.  Neurological:     General: No focal deficit present.     Mental Status: He is alert and oriented to person, place, and time.  Psychiatric:        Mood and Affect: Mood normal.        Behavior: Behavior normal.        Thought Content: Thought content normal.        Judgment: Judgment normal.     No results found for any visits on 09/04/22.      Assessment & Plan:   Problem List Items Addressed This Visit       Respiratory   Sinobronchitis - Primary    - pt not officially diagnosed with COPD but does have a hx of smoking and has had an increase in sputum production and coughing - given patient duoneb in clinic today due to significant wheezing on exam today - will go ahead and do doxycycline as well as steroids - if no better in one week will get a cxr  - post duoneb pt feels much better and wheezing is improved      Relevant Medications   predniSONE (DELTASONE) 20 MG tablet   amoxicillin-clavulanate (AUGMENTIN) 875-125 MG tablet     Other   Hyperlipidemia   Relevant Medications   atorvastatin (LIPITOR) 40 MG tablet   Other Visit Diagnoses     Wheezing [R06.2]           Meds ordered this encounter  Medications   predniSONE (DELTASONE) 20 MG tablet    Sig: Take 1 tablet (20 mg total) by mouth 2 (two) times daily with a  meal for 5 days.    Dispense:  10 tablet    Refill:  0   amoxicillin-clavulanate (AUGMENTIN) 875-125 MG tablet    Sig: Take 1 tablet by mouth 2 (two) times daily for 7 days.    Dispense:  14 tablet    Refill:  0   atorvastatin (LIPITOR) 40 MG tablet    Sig: Take 1 tablet (40 mg total) by mouth daily.    Dispense:  90 tablet    Refill:  0    Return if symptoms worsen or fail to improve.  Charlton Amor, DO

## 2022-09-04 NOTE — Assessment & Plan Note (Addendum)
-   pt not officially diagnosed with COPD but does have a hx of smoking and has had an increase in sputum production and coughing - given patient duoneb in clinic today due to significant wheezing on exam today - will go ahead and do doxycycline as well as steroids - if no better in one week will get a cxr  - post duoneb pt feels much better and wheezing is improved

## 2022-09-05 ENCOUNTER — Telehealth: Payer: Self-pay | Admitting: *Deleted

## 2022-09-05 ENCOUNTER — Ambulatory Visit: Payer: Medicare Other

## 2022-09-05 NOTE — Telephone Encounter (Signed)
Called pt since he missed his appt today. He states he started prednisone yesterday. He states he is taking prednisone 20mg  daily not twice a day and a augmentin. Advised that the prednisone will interact with warfarin and will need to see him this week. Appointment set for Friday at 1030am.

## 2022-09-08 ENCOUNTER — Encounter: Payer: Self-pay | Admitting: Family Medicine

## 2022-09-08 ENCOUNTER — Ambulatory Visit (INDEPENDENT_AMBULATORY_CARE_PROVIDER_SITE_OTHER): Payer: Medicare Other | Admitting: Family Medicine

## 2022-09-08 ENCOUNTER — Ambulatory Visit: Payer: Medicare Other | Attending: Cardiovascular Disease | Admitting: *Deleted

## 2022-09-08 VITALS — BP 132/82 | HR 70 | Ht 76.0 in | Wt 187.0 lb

## 2022-09-08 DIAGNOSIS — R918 Other nonspecific abnormal finding of lung field: Secondary | ICD-10-CM

## 2022-09-08 DIAGNOSIS — J40 Bronchitis, not specified as acute or chronic: Secondary | ICD-10-CM

## 2022-09-08 DIAGNOSIS — R7301 Impaired fasting glucose: Secondary | ICD-10-CM

## 2022-09-08 DIAGNOSIS — J329 Chronic sinusitis, unspecified: Secondary | ICD-10-CM | POA: Diagnosis not present

## 2022-09-08 DIAGNOSIS — Z9889 Other specified postprocedural states: Secondary | ICD-10-CM | POA: Diagnosis not present

## 2022-09-08 DIAGNOSIS — I10 Essential (primary) hypertension: Secondary | ICD-10-CM | POA: Diagnosis not present

## 2022-09-08 DIAGNOSIS — I712 Thoracic aortic aneurysm, without rupture, unspecified: Secondary | ICD-10-CM | POA: Diagnosis not present

## 2022-09-08 DIAGNOSIS — Z7901 Long term (current) use of anticoagulants: Secondary | ICD-10-CM | POA: Diagnosis not present

## 2022-09-08 DIAGNOSIS — J42 Unspecified chronic bronchitis: Secondary | ICD-10-CM

## 2022-09-08 LAB — POCT GLYCOSYLATED HEMOGLOBIN (HGB A1C): Hemoglobin A1C: 5.6 % (ref 4.0–5.6)

## 2022-09-08 LAB — POCT INR: INR: 1.8 — AB (ref 2.0–3.0)

## 2022-09-08 MED ORDER — TRELEGY ELLIPTA 100-62.5-25 MCG/ACT IN AEPB: 1.0000 | INHALATION_SPRAY | Freq: Every day | RESPIRATORY_TRACT | 1 refills | Status: DC

## 2022-09-08 MED ORDER — AMOXICILLIN-POT CLAVULANATE 875-125 MG PO TABS
1.00 | ORAL_TABLET | Freq: Two times a day (BID) | ORAL | 0 refills | Status: AC
Start: 2022-09-08 — End: 2022-09-15

## 2022-09-08 NOTE — Patient Instructions (Addendum)
Description   Today take 1.5 tablets of warfarin then continue taking Warfarin 1 tablet daily. Stay consistent with greens each week (3 times per week).   Repeat INR in 2 weeks.  Coumadin Clinic [phone]

## 2022-09-08 NOTE — Progress Notes (Signed)
Established Patient Office Visit  Subjective   Patient ID: Corey Mathis, male    DOB: April 07, 1950  Age: 72 y.o. MRN: [id number]  Chief Complaint  Patient presents with   Hypertension   ifg    HPI He was seen in the emergency department on May 21 for a asthma exacerbation.  With a steroid injection and given a prescription for albuterol rescue inhaler.  He then followed up on July 1 after not feeling much better and having some yellow sinus drainage and continued cough and wheezing and was treated with sinobronchitis.  He was given prednisone and Augmentin which he will complete this weekend.  He says he feels some better but still feels like there is a lot of mucus that is making him feel a little short of breath and his upper airway he is also still getting some yellow sinus drainage but no sinus pain.  No fevers chills or sweats.  He also has a history of CHF but his weight is actually down so I do not think he is volume overloaded and no sign of volume overload on exam today.  Hypertension- Pt denies chest pain, SOB, dizziness, or heart palpitations.  Taking meds as directed w/o problems.  Denies medication side effects.    Impaired fasting glucose-no increased thirst or urination. No symptoms consistent with hypoglycemia.    ROS    Objective:     BP 132/82 (BP Location: Left Arm)   Pulse 70   Ht 6\' 4"  (1.93 m)   Wt 187 lb (84.8 kg)   SpO2 100%   BMI 22.76 kg/m    Physical Exam Constitutional:      Appearance: He is well-developed.  HENT:     Head: Normocephalic and atraumatic.     Right Ear: External ear normal.     Left Ear: Tympanic membrane, ear canal and external ear normal.     Ears:     Comments: Right TM is blocked by cerumen.    Nose: Nose normal.     Mouth/Throat:     Pharynx: Oropharynx is clear.  Eyes:     Conjunctiva/sclera: Conjunctivae normal.     Pupils: Pupils are equal, round, and reactive to light.  Neck:     Thyroid: No thyromegaly.   Cardiovascular:     Rate and Rhythm: Normal rate.     Heart sounds: Normal heart sounds.  Pulmonary:     Effort: Pulmonary effort is normal.     Breath sounds: Wheezing and rhonchi present.  Musculoskeletal:     Cervical back: Neck supple.  Lymphadenopathy:     Cervical: No cervical adenopathy.  Skin:    General: Skin is warm and dry.  Neurological:     Mental Status: He is alert and oriented to person, place, and time.      Results for orders placed or performed in visit on 09/08/22  POCT glycosylated hemoglobin (Hb A1C)  Result Value Ref Range   Hemoglobin A1C 5.6 4.0 - 5.6 %   HbA1c POC (<> result, manual entry)     HbA1c, POC (prediabetic range)     HbA1c, POC (controlled diabetic range)    Results for orders placed or performed in visit on 09/08/22  POCT INR  Result Value Ref Range   INR 1.8 (A) 2.0 - 3.0   POC INR        The ASCVD Risk score (Arnett DK, et al., 2019) failed to calculate for the following reasons:  The valid total cholesterol range is 130 to 320 mg/dL    Assessment & Plan:   Problem List Items Addressed This Visit       Cardiovascular and Mediastinum   Essential hypertension - Primary    Repeat blood pressure was much improved at 132/82.  Not quite ideal but much better.  He really has not been checking blood pressures at home but we will keep an eye on it for now.  He admits that he sometimes forgets to take his midday dose of hydralazine.  He says he will work on it.  He is also been eating a fair amount of garlic salt sometimes 2-3 times a day has been adding it to food.  So we did discuss switching to plain garlic and cutting out some of the salt as well.      Relevant Orders   COMPLETE METABOLIC PANEL WITH GFR   CT ANGIO CHEST AORTA W/CM & OR WO/CM     Respiratory   RESOLVED: Sinobronchitis   Relevant Medications   amoxicillin-clavulanate (AUGMENTIN) 875-125 MG tablet   Chronic bronchitis (HCC)    Still has significant rhonchi and  wheezing on exam he has been using his Advair daily.  Will switch to Trelegy.  He is also been paying a lot for his inhalers so encouraged him to please let me know if the inhaler is expensive and we can try switching it to something else.  I would also like to go ahead and get a chest CT.  He is actually due next month for follow-up for aortic aneurysm some to go to see if we might be able to just get the imaging done a little ahead of time and that way we could address the aneurysm, chronic pulmonary nodules and his new or worsening symptoms.      Relevant Orders   CT ANGIO CHEST AORTA W/CM & OR WO/CM     Endocrine   IFG (impaired fasting glucose)   Relevant Orders   COMPLETE METABOLIC PANEL WITH GFR   POCT glycosylated hemoglobin (Hb A1C) (Completed)   Other Visit Diagnoses     Thoracic aortic aneurysm without rupture, unspecified part (HCC)       Relevant Orders   CT ANGIO CHEST AORTA W/CM & OR WO/CM   Pulmonary nodules       Relevant Orders   CT ANGIO CHEST AORTA W/CM & OR WO/CM       Return in about 6 months (around 03/11/2023) for bp/ifg.    Nani Gasser, MD

## 2022-09-08 NOTE — Assessment & Plan Note (Signed)
Repeat blood pressure was much improved at 132/82.  Not quite ideal but much better.  He really has not been checking blood pressures at home but we will keep an eye on it for now.  He admits that he sometimes forgets to take his midday dose of hydralazine.  He says he will work on it.  He is also been eating a fair amount of garlic salt sometimes 2-3 times a day has been adding it to food.  So we did discuss switching to plain garlic and cutting out some of the salt as well.

## 2022-09-08 NOTE — Assessment & Plan Note (Signed)
Still has significant rhonchi and wheezing on exam he has been using his Advair daily.  Will switch to Trelegy.  He is also been paying a lot for his inhalers so encouraged him to please let me know if the inhaler is expensive and we can try switching it to something else.  I would also like to go ahead and get a chest CT.  He is actually due next month for follow-up for aortic aneurysm some to go to see if we might be able to just get the imaging done a little ahead of time and that way we could address the aneurysm, chronic pulmonary nodules and his new or worsening symptoms.

## 2022-09-09 LAB — COMPLETE METABOLIC PANEL WITH GFR
AG Ratio: 1.2 (calc) (ref 1.0–2.5)
ALT: 26 U/L (ref 9–46)
AST: 38 U/L — ABNORMAL HIGH (ref 10–35)
Albumin: 4.2 g/dL (ref 3.6–5.1)
Alkaline phosphatase (APISO): 102 U/L (ref 35–144)
BUN: 11 mg/dL (ref 7–25)
CO2: 26 mmol/L (ref 20–32)
Calcium: 9.7 mg/dL (ref 8.6–10.3)
Chloride: 95 mmol/L — ABNORMAL LOW (ref 98–110)
Creat: 0.87 mg/dL (ref 0.70–1.28)
Globulin: 3.4 g/dL (calc) (ref 1.9–3.7)
Glucose, Bld: 80 mg/dL (ref 65–99)
Potassium: 4.3 mmol/L (ref 3.5–5.3)
Sodium: 133 mmol/L — ABNORMAL LOW (ref 135–146)
Total Bilirubin: 0.8 mg/dL (ref 0.2–1.2)
Total Protein: 7.6 g/dL (ref 6.1–8.1)
eGFR: 91 mL/min/{1.73_m2} (ref >=60)

## 2022-09-11 NOTE — Progress Notes (Signed)
Corey Mathis, your sodium is a little bit low which is a little unusual for you some not sure if this is just lab error or if this is truly accurate so would like to repeat that in about 2 or 3 weeks.  Your AST liver enzyme is mildly elevated similar to those in the past.  Please limit any alcohol or Tylenol intake.

## 2022-09-18 ENCOUNTER — Ambulatory Visit (INDEPENDENT_AMBULATORY_CARE_PROVIDER_SITE_OTHER): Payer: Medicare Other

## 2022-09-18 DIAGNOSIS — J42 Unspecified chronic bronchitis: Secondary | ICD-10-CM

## 2022-09-18 DIAGNOSIS — I2510 Atherosclerotic heart disease of native coronary artery without angina pectoris: Secondary | ICD-10-CM | POA: Diagnosis not present

## 2022-09-18 DIAGNOSIS — I10 Essential (primary) hypertension: Secondary | ICD-10-CM | POA: Diagnosis not present

## 2022-09-18 DIAGNOSIS — I712 Thoracic aortic aneurysm, without rupture, unspecified: Secondary | ICD-10-CM

## 2022-09-18 DIAGNOSIS — R918 Other nonspecific abnormal finding of lung field: Secondary | ICD-10-CM

## 2022-09-18 DIAGNOSIS — I700 Atherosclerosis of aorta: Secondary | ICD-10-CM | POA: Diagnosis not present

## 2022-09-18 DIAGNOSIS — I7121 Aneurysm of the ascending aorta, without rupture: Secondary | ICD-10-CM | POA: Diagnosis not present

## 2022-09-18 DIAGNOSIS — R9389 Abnormal findings on diagnostic imaging of other specified body structures: Secondary | ICD-10-CM

## 2022-09-18 MED ORDER — IOHEXOL 350 MG/ML SOLN
100.0000 mL | Freq: Once | INTRAVENOUS | Status: AC | PRN
Start: 2022-09-18 — End: 2022-09-18
  Administered 2022-09-18: 100 mL via INTRAVENOUS

## 2022-09-19 NOTE — Progress Notes (Signed)
Hi Corey Mathis, the scan of your chest does show a little plaque formation in some of the arteries and aorta near the heart.  The widened area of the aorta is measuring of 4.1 cm.  It was 4.0 cm last year.  The cardiologist will review this at your follow-up.  But will likely recommend repeating your scan again in 1 year.  They did note there is some thickened bronchial walls and what they call groundglass opacities in the lungs on both sides.  This can sometimes be from inflammation but can also be from infection.  So I would like to get you in with a pulmonologist especially if you are still not quite feeling yourself in regards to your chest still having cough and sputum production.  Also wanted to let you know that the l 3 mm lung nodule in the left side is stable.  It has been there.  But there is 1 on the right side measuring 8 mm that just a little bit larger they do recommend that we keep an eye on and plan to recheck again in 6 to 12 months.  Again we will be getting another scan in 12 months anyway.  If you are okay with a referral to a pulmonologist please let me know.  If you also have a preference for specific provider or location please let me know.  I typically refer to [facility] pulmonology which is a fantastic pulmonology group.

## 2022-09-20 ENCOUNTER — Other Ambulatory Visit: Payer: Self-pay | Admitting: Cardiology

## 2022-09-20 ENCOUNTER — Other Ambulatory Visit: Payer: Self-pay | Admitting: Family Medicine

## 2022-09-20 DIAGNOSIS — I428 Other cardiomyopathies: Secondary | ICD-10-CM

## 2022-09-20 DIAGNOSIS — E7800 Pure hypercholesterolemia, unspecified: Secondary | ICD-10-CM

## 2022-09-20 NOTE — Progress Notes (Signed)
Spoke with patient . He is agreeable to pulmonology referral to [place]. Pended this referral. He will be out of town the week of July 28th through Aug 1.

## 2022-09-21 ENCOUNTER — Ambulatory Visit: Payer: Medicare Other

## 2022-09-24 NOTE — Progress Notes (Signed)
Orders Placed This Encounter     Ambulatory referral to Pulmonology         Referral Priority:Routine         Referral Type:Consultation         Referral Reason:Specialty Services Required         Requested Specialty:Pulmonary Disease         Number of Visits Requested:1

## 2022-09-27 ENCOUNTER — Ambulatory Visit: Payer: Medicare Other | Attending: Cardiology | Admitting: *Deleted

## 2022-09-27 DIAGNOSIS — Z9889 Other specified postprocedural states: Secondary | ICD-10-CM | POA: Insufficient documentation

## 2022-09-27 DIAGNOSIS — Z7901 Long term (current) use of anticoagulants: Secondary | ICD-10-CM | POA: Diagnosis not present

## 2022-09-27 LAB — POCT INR: INR: 4.3 — AB (ref 2.0–3.0)

## 2022-09-27 NOTE — Patient Instructions (Signed)
Description   Do not take any warfarin today then continue taking Warfarin 1 tablet daily. Stay consistent with greens each week (3 times per week).   Repeat INR in 3 weeks.  [facility] [phone]

## 2022-10-04 ENCOUNTER — Other Ambulatory Visit: Payer: Self-pay | Admitting: Cardiology

## 2022-10-04 DIAGNOSIS — Z9889 Other specified postprocedural states: Secondary | ICD-10-CM

## 2022-10-04 NOTE — Telephone Encounter (Signed)
Warfarin 10mg  refill MITRAL VALVE REPLACEMENT  Last INR 09/27/22 Last OV 09/05/21; pt needs an appt with Cardiologist & NOTED ON NEXT CVRR APPT

## 2022-10-05 DIAGNOSIS — R0602 Shortness of breath: Secondary | ICD-10-CM | POA: Diagnosis not present

## 2022-10-05 DIAGNOSIS — R051 Acute cough: Secondary | ICD-10-CM | POA: Diagnosis not present

## 2022-10-05 DIAGNOSIS — Z03818 Encounter for observation for suspected exposure to other biological agents ruled out: Secondary | ICD-10-CM | POA: Diagnosis not present

## 2022-10-05 DIAGNOSIS — R531 Weakness: Secondary | ICD-10-CM | POA: Diagnosis not present

## 2022-10-06 ENCOUNTER — Other Ambulatory Visit: Payer: Self-pay | Admitting: *Deleted

## 2022-10-06 MED ORDER — TRELEGY ELLIPTA 100-62.5-25 MCG/ACT IN AEPB: 1.0000 | INHALATION_SPRAY | Freq: Every day | RESPIRATORY_TRACT | 3 refills | Status: DC

## 2022-10-09 DIAGNOSIS — D519 Vitamin B12 deficiency anemia, unspecified: Secondary | ICD-10-CM | POA: Diagnosis not present

## 2022-10-09 DIAGNOSIS — D599 Acquired hemolytic anemia, unspecified: Secondary | ICD-10-CM | POA: Diagnosis not present

## 2022-10-09 DIAGNOSIS — M069 Rheumatoid arthritis, unspecified: Secondary | ICD-10-CM | POA: Diagnosis not present

## 2022-10-09 DIAGNOSIS — D649 Anemia, unspecified: Secondary | ICD-10-CM | POA: Diagnosis not present

## 2022-10-10 ENCOUNTER — Other Ambulatory Visit: Payer: Self-pay | Admitting: Family Medicine

## 2022-10-10 DIAGNOSIS — F411 Generalized anxiety disorder: Secondary | ICD-10-CM

## 2022-10-18 ENCOUNTER — Other Ambulatory Visit: Payer: Self-pay

## 2022-10-18 ENCOUNTER — Ambulatory Visit: Payer: Medicare Other | Attending: Cardiology

## 2022-10-18 DIAGNOSIS — E7800 Pure hypercholesterolemia, unspecified: Secondary | ICD-10-CM

## 2022-10-18 DIAGNOSIS — Z7901 Long term (current) use of anticoagulants: Secondary | ICD-10-CM

## 2022-10-18 DIAGNOSIS — Z9889 Other specified postprocedural states: Secondary | ICD-10-CM | POA: Diagnosis not present

## 2022-10-18 LAB — POCT INR: POC INR: 3.6

## 2022-10-18 MED ORDER — ATORVASTATIN CALCIUM 40 MG PO TABS
40.0000 mg | ORAL_TABLET | Freq: Every day | ORAL | 0 refills | Status: DC
Start: 2022-10-18 — End: 2023-11-12

## 2022-10-18 NOTE — Telephone Encounter (Signed)
Pt came to office today and states he received a refill on his Lipitor last month but he lost the bottle. Please advise/refill.

## 2022-10-18 NOTE — Patient Instructions (Signed)
Description   Eat a serving of greens today and continue taking Warfarin 1 tablet daily.  Stay consistent with greens each week (3 times per week).   Repeat INR in 4 weeks.  [facility] [phone]

## 2022-10-30 ENCOUNTER — Other Ambulatory Visit: Payer: Self-pay | Admitting: Cardiology

## 2022-11-15 ENCOUNTER — Ambulatory Visit: Payer: Medicare Other | Attending: Internal Medicine | Admitting: *Deleted

## 2022-11-15 DIAGNOSIS — Z9889 Other specified postprocedural states: Secondary | ICD-10-CM | POA: Insufficient documentation

## 2022-11-15 DIAGNOSIS — Z7901 Long term (current) use of anticoagulants: Secondary | ICD-10-CM | POA: Insufficient documentation

## 2022-11-15 LAB — POCT INR: INR: 3.7 — AB (ref 2.0–3.0)

## 2022-11-15 NOTE — Patient Instructions (Signed)
Description   Today take 1/2 tablet of warfarin then continue taking Warfarin 1 tablet daily.  Stay consistent with greens each week (3 times per week).   Repeat INR in 4 weeks.  [facility] [phone]

## 2022-11-22 ENCOUNTER — Encounter: Payer: Self-pay | Admitting: Nurse Practitioner

## 2022-11-22 ENCOUNTER — Ambulatory Visit: Payer: Medicare Other | Attending: Nurse Practitioner | Admitting: Nurse Practitioner

## 2022-11-22 VITALS — BP 142/84 | HR 68 | Ht 76.0 in | Wt 194.2 lb

## 2022-11-22 DIAGNOSIS — Z952 Presence of prosthetic heart valve: Secondary | ICD-10-CM

## 2022-11-22 DIAGNOSIS — I2510 Atherosclerotic heart disease of native coronary artery without angina pectoris: Secondary | ICD-10-CM | POA: Diagnosis not present

## 2022-11-22 DIAGNOSIS — I10 Essential (primary) hypertension: Secondary | ICD-10-CM | POA: Diagnosis present

## 2022-11-22 DIAGNOSIS — I428 Other cardiomyopathies: Secondary | ICD-10-CM

## 2022-11-22 DIAGNOSIS — I77819 Aortic ectasia, unspecified site: Secondary | ICD-10-CM | POA: Diagnosis not present

## 2022-11-22 DIAGNOSIS — E785 Hyperlipidemia, unspecified: Secondary | ICD-10-CM

## 2022-11-22 NOTE — Progress Notes (Signed)
Office Visit    Patient Name: Corey Mathis Date of Encounter: 11/22/2022  Primary Care Provider:  Agapito Games, MD Primary Cardiologist:  Olga Millers, MD  Chief Complaint    72 year old male with a history of mechanical mitral valve replacement on Coumadin, CAD, NICM, aortic root dilation, hypertension, and hyperlipidemia who presents for follow-up related to CAD, NICM and mitral valve disorder.  Past Medical History    Past Medical History:  Diagnosis Date   Allergy    Anxiety    Asthma    CHF (congestive heart failure) (HCC)    Dyslipidemia    History of mitral valve replacement    Hypertension    Labyrinthitis    hx   Past Surgical History:  Procedure Laterality Date   ACNE CYST REMOVAL     from hand and back   COLONOSCOPY WITH PROPOFOL N/A 04/21/2021   Procedure: COLONOSCOPY WITH PROPOFOL;  Surgeon: Lemar Lofty., MD;  Location: [facility] ENDOSCOPY;  Service: Gastroenterology;  Laterality: N/A;   EXTERNAL FIXATION LEG Right 08/11/2017   Procedure: EXTERNAL FIXATION, RIGHT KNEE;  Surgeon: Tarry Kos, MD;  Location: [facility] OR;  Service: Orthopedics;  Laterality: Right;   FINE NEEDLE ASPIRATION Right 08/11/2017   Procedure: FINE NEEDLE ASPIRATION  of right Knee with 80 cc of dark red fluid removed;  Surgeon: Tarry Kos, MD;  Location: [facility] OR;  Service: Orthopedics;  Laterality: Right;   FLEXIBLE SIGMOIDOSCOPY N/A 02/11/2018   Procedure: FLEXIBLE SIGMOIDOSCOPY;  Surgeon: Sherrilyn Rist, MD;  Location: [facility] ENDOSCOPY;  Service: Gastroenterology;  Laterality: N/A;   Jaw surgery (other)     NASAL SINUS SURGERY N/A 10/16/2012   Procedure: NASAL ENDOSCOPIC POLYPECTOMY/MAXILLARY ANTROSTOMY/ETHMOIDECTOMY;  Surgeon: Serena Colonel, MD;  Location: [facility] OR;  Service: ENT;  Laterality: N/A;   ORIF TIBIA PLATEAU Left 08/13/2017   Procedure: OPEN REDUCTION INTERNAL FIXATION (ORIF) TIBIAL PLATEAU;  Surgeon: Tarry Kos, MD;  Location: [facility] OR;  Service: Orthopedics;   Laterality: Left;   ORIF TIBIA PLATEAU Right 08/22/2017   Procedure: OPEN REDUCTION INTERNAL FIXATION (ORIF) RIGHT BICONDYLAR TIBIAL PLATEAU, REMOVAL OF EX FIX;  Surgeon: Tarry Kos, MD;  Location: [facility] OR;  Service: Orthopedics;  Laterality: Right;   POLYPECTOMY  04/21/2021   Procedure: POLYPECTOMY;  Surgeon: Mansouraty, Netty Starring., MD;  Location: [facility] ENDOSCOPY;  Service: Gastroenterology;;   RIGHT/LEFT HEART CATH AND CORONARY ANGIOGRAPHY N/A 07/12/2017   Procedure: RIGHT/LEFT HEART CATH AND CORONARY ANGIOGRAPHY;  Surgeon: Kathleene Hazel, MD;  Location: [facility] INVASIVE CV LAB;  Service: Cardiovascular;  Laterality: N/A;   SUBMUCOSAL INJECTION  02/11/2018   Procedure: SUBMUCOSAL INJECTION;  Surgeon: Sherrilyn Rist, MD;  Location: [facility] ENDOSCOPY;  Service: Gastroenterology;;   TOOTH EXTRACTION     VALVE REPLACEMENT  1998   [employer], mitral    Allergies  Allergies  Allergen Reactions   Lisinopril Anaphylaxis and Other (See Comments)    angioedema   Carvedilol Other (See Comments)    wheezing   Ezetimibe-Simvastatin Other (See Comments)    Serious vertigo   Propranolol Hcl Other (See Comments)    wheezing   Ramipril Other (See Comments)    wheezing   Codeine Nausea Only     Labs/Other Studies Reviewed    The following studies were reviewed today:  Cardiac Studies & Procedures   CARDIAC CATHETERIZATION  CARDIAC CATHETERIZATION 07/12/2017  Narrative  Prox RCA lesion is 20% stenosed.  Mid RCA lesion is 20% stenosed.  Prox LAD  to Mid LAD lesion is 50% stenosed.  The left ventricular systolic function is normal.  LV end diastolic pressure is normal.  The left ventricular ejection fraction is greater than 65% by visual estimate.  There is trivial (1+) mitral regurgitation.  1. 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 The mid vessel has a smooth 50% stenosis. The vessel prior to the lesion and distal to the lesion is very large. The stenosis is  not flow limiting as there is a large luminal area. 2. The Circumflex is a large vessel with a large obtuse marginal branch and a large intermediate branch. No obstructive disease in this system 3. The RCA is a very large dominant vessel with mild non-obstructive plaque. 4. LV systolic function is normal with LVEF estimated at 60-65%. 5. The mitral valve leaflets are opening well. Trivial MR. 6. Normal right and left heart filling pressures  Recommendations: Medical management of non-obstructive CAD.  Findings Coronary Findings Diagnostic  Dominance: Right  Left Anterior Descending Vessel is large. Prox LAD to Mid LAD lesion is 50% stenosed.  Left Circumflex Vessel is large.  Right Coronary Artery Vessel is large. Prox RCA lesion is 20% stenosed. Mid RCA lesion is 20% stenosed.  Intervention  No interventions have been documented.   STRESS TESTS  MYOCARDIAL PERFUSION IMAGING 07/27/2016  Narrative  The left ventricular ejection fraction is moderately decreased (30-44%).  Nuclear stress EF: 38%.  There was no ST segment deviation noted during stress.  Defect 1: There is a medium defect of moderate severity present in the basal inferior and mid inferior location.  This is an intermediate risk study.  Intermediate risk stress nuclear study with inferior thinning vs infarct; no ischemia; EF 38 with global hypokinesis and mild LVE; study interpreted as intermediate risk due to reduced LV function; findings most c/w NICM.   ECHOCARDIOGRAM  ECHOCARDIOGRAM COMPLETE 08/11/2020  Narrative ECHOCARDIOGRAM REPORT    Patient Name:   Corey Mathis Date of Exam: 08/11/2020 Medical Rec #:  [id number]       Height:       76.0 in Accession #:    [id number]      Weight:       190.0 lb Date of Birth:  December 19, 1950       BSA:          2.167 m Patient Age:    70 years        BP:           134/82 mmHg Patient Gender: M               HR:           63 bpm. Exam Location:  [address]  Procedure: 2D Echo, Cardiac Doppler and Color Doppler  Indications:    I05.9 Mitral valve disorder  History:        Patient has prior history of Echocardiogram examinations, most recent 03/08/2018. CHF; Risk Factors:Hypertension and Dyslipidemia. NICM. Mitral valve replacement with mechanical valve. Asthma.  Mitral Valve: mechanical valve valve is present in the mitral position. Procedure Date: 1998.  Sonographer:    Cathie Beams RCS Referring Phys: [phone] BRIAN S CRENSHAW  IMPRESSIONS   1. Left ventricular ejection fraction, by estimation, is 45 to 50%. The left ventricle has mildly decreased function. The left ventricle demonstrates global hypokinesis. There is mild concentric left ventricular hypertrophy. Left ventricular diastolic function could not be evaluated. 2. Right ventricular systolic function is normal.  The right ventricular size is normal. Tricuspid regurgitation signal is inadequate for assessing PA pressure. 3. Left atrial size was moderately dilated. 4. The mitral valve has been repaired/replaced. Trivial mitral valve regurgitation. The mean mitral valve gradient is 2.9 mmHg with average heart rate of 64 bpm. There is a mechanical valve present in the mitral position. Procedure Date: 1998. 5. The aortic valve is tricuspid. Aortic valve regurgitation is not visualized. 6. Aortic dilatation noted. There is mild dilatation of the aortic root, measuring 40 mm.  Comparison(s): No significant change from prior study. Prior images reviewed side by side.  FINDINGS Left Ventricle: Left ventricular ejection fraction, by estimation, is 45 to 50%. The left ventricle has mildly decreased function. The left ventricle demonstrates global hypokinesis. The left ventricular internal cavity size was normal in size. There is mild concentric left ventricular hypertrophy. Abnormal (paradoxical) septal motion consistent with post-operative status. Left ventricular diastolic function  could not be evaluated due to mitral valve replacement. Left ventricular diastolic function could not be evaluated.  Right Ventricle: The right ventricular size is normal. No increase in right ventricular wall thickness. Right ventricular systolic function is normal. Tricuspid regurgitation signal is inadequate for assessing PA pressure.  Left Atrium: Left atrial size was moderately dilated.  Right Atrium: Right atrial size was normal in size. Prominent Eustachian valve.  Pericardium: There is no evidence of pericardial effusion.  Mitral Valve: The mitral valve has been repaired/replaced. Trivial mitral valve regurgitation, with ("physiological backflow"). There is a mechanical valve present in the mitral position. Procedure Date: 1998. The mean mitral valve gradient is 2.9 mmHg with average heart rate of 64 bpm.  Tricuspid Valve: The tricuspid valve is normal in structure. Tricuspid valve regurgitation is not demonstrated.  Aortic Valve: The aortic valve is tricuspid. Aortic valve regurgitation is not visualized.  Pulmonic Valve: The pulmonic valve was not well visualized. Pulmonic valve regurgitation is not visualized.  Aorta: Aortic dilatation noted. There is mild dilatation of the aortic root, measuring 40 mm.  IAS/Shunts: No atrial level shunt detected by color flow Doppler.   LEFT VENTRICLE PLAX 2D LVIDd:         4.80 cm  Diastology LVIDs:         3.70 cm  LV e' medial:    3.90 cm/s LV PW:         1.40 cm  LV E/e' medial:  19.9 LV IVS:        1.20 cm  LV e' lateral:   5.40 cm/s LVOT diam:     2.20 cm  LV E/e' lateral: 14.4 LV SV:         64 LV SV Index:   29 LVOT Area:     3.80 cm   RIGHT VENTRICLE RV Basal diam:  2.30 cm RV S prime:     5.55 cm/s TAPSE (M-mode): 1.8 cm  LEFT ATRIUM           Index       RIGHT ATRIUM           Index LA diam:      2.80 cm 1.29 cm/m  RA Area:     14.30 cm LA Vol (A4C): 68.0 ml 31.37 ml/m RA Volume:   35.50 ml  16.38 ml/m AORTIC  VALVE LVOT Vmax:   84.90 cm/s LVOT Vmean:  52.200 cm/s LVOT VTI:    0.168 m  AORTA Ao Root diam: 4.20 cm  MITRAL VALVE MV Area (PHT): 1.75 cm  SHUNTS MV Mean grad:  2.9 mmHg     Systemic VTI:  0.17 m MV Decel Time: 434 msec     Systemic Diam: 2.20 cm MV E velocity: 77.70 cm/s MV A velocity: 138.00 cm/s MV E/A ratio:  0.56  Mihai Croitoru MD Electronically signed by Thurmon Fair MD Signature Date/Time: 08/11/2020/5:08:11 PM    Final            Recent Labs: 03/10/2022: Hemoglobin 11.7; Platelets 164 09/08/2022: ALT 26; BUN 11; Creat 0.87; Potassium 4.3; Sodium 133  Recent Lipid Panel    Component Value Date/Time   CHOL 124 03/10/2022 1407   CHOL 124 10/20/2020 1533   TRIG 87 03/10/2022 1407   HDL 37 (L) 03/10/2022 1407   HDL 37 (L) 10/20/2020 1533   CHOLHDL 3.4 03/10/2022 1407   VLDL 32 (H) 05/12/2016 0839   LDLCALC 70 03/10/2022 1407    History of Present Illness    72 year old male with the above past medical history including mechanical mitral valve replacement on Coumadin, CAD, NICM, aortic root dilation, hypertension, and hyperlipidemia.  He has a history of mitral valve replacement with a Patent examiner valve in 1998 on Coumadin.  Abdominal ultrasound in November 2015 showed no aneurysm.  Echocardiogram December 2018 showed EF 35 to 40% with diffuse hypokinesis, dilated ascending aorta measuring 43 mm, stable mechanical mitral valve with mean gradient 3 mmHg, mild biatrial enlargement.  Cardiac catheterization in May 2019 showed moderate nonobstructive CAD, EF greater than 65%.  Most recent echocardiogram in June 2022 showed EF 45 to 50%, mild LVH, moderate left atrial enlargement, stable mitral valve replacement, mean gradient 3 mmHg, mildly dilated aortic root at 40 mm.  CTA in August 2022 showed 4.1 cm ascending aortic aneurysm.  He was last seen in the office on 11/23/2020 and was stable from a cardiac standpoint.  He did note some dyspnea with more extreme  activities.  He denied exertional chest pain.  He was seen virtually on 09/05/2021 for preoperative cardiac evaluation and was doing well.  CT of the chest aorta per PCP in 09/2022 showed stable aortic dilation measuring 4.1 cm.  Repeat scan was recommended in 1 year.  There was evidence of thickened bronchial walls and groundglass opacities bilaterally, he was referred to pulmonology.  He was noted to have bilateral pulmonary nodules.  Repeat CT of the chest was recommended in 6 to 12 months.  He presents today for follow-up.  Since his last visit he has done well from a cardiac standpoint.  He notes stable mild dyspnea with more extreme activities, denies any exertional chest pain.  He denies palpitations, edema, PND, orthopnea, weight gain.  BP is slightly elevated in office today, he notes he drank caffeine prior to his visit and his BP has been generally well-controlled at home.  He has not followed up with pulmonology but plans to do so.  Overall, he reports feeling well.  Home Medications    Current Outpatient Medications  Medication Sig Dispense Refill   albuterol (VENTOLIN HFA) 108 (90 Base) MCG/ACT inhaler Inhale 2 puffs into the lungs every 6 (six) hours as needed for wheezing or shortness of breath. 18 g 1   antiseptic oral rinse (BIOTENE) LIQD 5 mLs by Mouth Rinse route as needed for dry mouth or mouth pain.     aspirin 81 MG EC tablet Take 81 mg by mouth at bedtime.     atorvastatin (LIPITOR) 40 MG tablet Take 1 tablet (40 mg total) by  mouth daily. 90 tablet 0   fluticasone (FLONASE) 50 MCG/ACT nasal spray Place 1 spray into both nostrils daily as needed (congestion). 16 g 0   Fluticasone-Umeclidin-Vilant (TRELEGY ELLIPTA) 100-62.5-25 MCG/ACT AEPB Inhale 1 puff into the lungs daily. 60 each 3   hydrALAZINE (APRESOLINE) 50 MG tablet TAKE 1 TABLET BY MOUTH THREE TIMES DAILY 270 tablet 0   isosorbide mononitrate (IMDUR) 60 MG 24 hr tablet Take 1 tablet by mouth once daily 90 tablet 0    metoprolol succinate (TOPROL-XL) 100 MG 24 hr tablet TAKE 1 TABLET BY MOUTH ONCE DAILY. APPOINTMENT REQUIRED FOR FUTURE REFILLS 90 tablet 2   metoprolol succinate (TOPROL-XL) 50 MG 24 hr tablet TAKE 1 TABLET BY MOUTH ONCE DAILY AT BEDTIME WITH 100 MG TABLET FOR A TOTAL OF 150 MG 90 tablet 3   Multiple Vitamin (MULTIVITAMIN WITH MINERALS) TABS tablet Take 1 tablet by mouth at bedtime.     PARoxetine (PAXIL) 30 MG tablet Take 1 tablet by mouth once daily 90 tablet 1   triamcinolone cream (KENALOG) 0.1 % Apply 1 Application topically 2 (two) times daily. 30 g 2   UNABLE TO FIND Med Name: BP safe medication, otc, mult-symptom for cough/congestion.     warfarin (COUMADIN) 10 MG tablet TAKE 1 (ONE) TABLET BY MOUTH ONCE DAILY AS DIRECTED BY  COUMADIN  CLINIC 100 tablet 0   No current facility-administered medications for this visit.     Review of Systems    He denies chest pain, palpitations, pnd, orthopnea, n, v, dizziness, syncope, edema, weight gain, or early satiety. All other systems reviewed and are otherwise negative except as noted above.   Physical Exam    VS:  BP (!) 142/84   Pulse 68   Ht 6\' 4"  (1.93 m)   Wt 194 lb 3.2 oz (88.1 kg)   SpO2 95%   BMI 23.64 kg/m   GEN: Well nourished, well developed, in no acute distress. HEENT: normal. Neck: Supple, no JVD, carotid bruits, or masses. Cardiac: RRR, no murmurs, rubs, or gallops. No clubbing, cyanosis, edema.  Radials/DP/PT 2+ and equal bilaterally.  Respiratory:  Respirations regular and unlabored, clear to auscultation bilaterally. GI: Soft, nontender, nondistended, BS + x 4. MS: no deformity or atrophy. Skin: warm and dry, no rash. Neuro:  Strength and sensation are intact. Psych: Normal affect.  Accessory Clinical Findings    ECG personally reviewed by me today - EKG Interpretation Date/Time:  Wednesday November 22 2022 15:45:53 EDT Ventricular Rate:  68 PR Interval:  216 QRS Duration:  112 QT Interval:  454 QTC  Calculation: 482 R Axis:   45  Text Interpretation: Sinus rhythm with 1st degree A-V block Left ventricular hypertrophy Prolonged QT Confirmed by Bernadene Person ([id number]) on 11/22/2022 4:07:31 PM  - no acute changes.   Lab Results  Component Value Date   WBC 4.2 03/10/2022   HGB 11.7 (L) 03/10/2022   HCT 36.3 (L) 03/10/2022   MCV 74.2 (L) 03/10/2022   PLT 164 03/10/2022   Lab Results  Component Value Date   CREATININE 0.87 09/08/2022   BUN 11 09/08/2022   NA 133 (L) 09/08/2022   K 4.3 09/08/2022   CL 95 (L) 09/08/2022   CO2 26 09/08/2022   Lab Results  Component Value Date   ALT 26 09/08/2022   AST 38 (H) 09/08/2022   ALKPHOS 85 10/20/2020   BILITOT 0.8 09/08/2022   Lab Results  Component Value Date   CHOL 124 03/10/2022   HDL  37 (L) 03/10/2022   LDLCALC 70 03/10/2022   TRIG 87 03/10/2022   CHOLHDL 3.4 03/10/2022    Lab Results  Component Value Date   HGBA1C 5.6 09/08/2022    Assessment & Plan   1. S/p MVR: Most recent echo in June 2022 showed stable mitral valve replacement, mean gradient 3 mmHg.  Will repeat echo for routine monitoring.  Continue SBE prophylaxis, continue Coumadin.  2. CAD: Cardiac catheterization in May 2019 showed moderate nonobstructive CAD, EF greater than 65%. Stable with no anginal symptoms. No indication for ischemic evaluation. Continue aspirin, metoprolol, hydralazine, Imdur, Lipitor.  3. NICM: Most recent echocardiogram in June 2022 showed EF 45 to 50%, mild LVH, moderate left atrial enlargement, stable mitral valve replacement, mean gradient 3 mmHg, mildly dilated aortic root at 40 mm. Euvolemic and well compensated on exam. Repeat echo pending. Continue current medications of the above.  4. Aortic root dilation: CT of the chest aorta per PCP in 09/2022 showed stable aortic dilation measuring 4.1 cm. Repeat scan recommended in 1 year (09/2023).  Continue to monitor BP as below.  5. Hypertension: Slightly elevated above goal in office today  (he drank coffee before his visit), he states his SBP has been in the 120s consistently at home. Continue to monitor BP report BP consistent greater than 140/80. Continue current antihypertensive regimen.  6. Hyperlipidemia: LDL was 78 in 03/2022.  Continue Lipitor.  7. Disposition: Follow-up in 1 year.  HYPERTENSION CONTROL Vitals:   11/22/22 1540 11/22/22 1617  BP: (!) 152/82 (!) 142/84    The patient's blood pressure is elevated above target today.  In order to address the patient's elevated BP: Blood pressure will be monitored at home to determine if medication changes need to be made.     Joylene Grapes, NP 11/22/2022, 4:29 PM

## 2022-11-22 NOTE — Patient Instructions (Signed)
Medication Instructions:  No Changes *If you need a refill on your cardiac medications before your next appointment, please call your pharmacy*   Lab Work: No Labs If you have labs (blood work) drawn today and your tests are completely normal, you will receive your results only by: MyChart Message (if you have MyChart) OR A paper copy in the mail If you have any lab test that is abnormal or we need to change your treatment, we will call you to review the results.   Testing/Procedures: Your physician has requested that you have an echocardiogram. Echocardiography is a painless test that uses sound waves to create images of your heart. It provides your doctor with information about the size and shape of your heart and how well your heart's chambers and valves are working. This procedure takes approximately one hour. There are no restrictions for this procedure. Please do NOT wear cologne, perfume, aftershave, or lotions (deodorant is allowed). Please arrive 15 minutes prior to your appointment time.    Follow-Up: At [facility], you and your health needs are our priority.  As part of our continuing mission to provide you with exceptional heart care, we have created designated Provider Care Teams.  These Care Teams include your primary Cardiologist (physician) and Advanced Practice Providers (APPs -  Physician Assistants and Nurse Practitioners) who all work together to provide you with the care you need, when you need it.  We recommend signing up for the patient portal called "MyChart".  Sign up information is provided on this After Visit Summary.  MyChart is used to connect with patients for Virtual Visits (Telemedicine).  Patients are able to view lab/test results, encounter notes, upcoming appointments, etc.  Non-urgent messages can be sent to your provider as well.   To learn more about what you can do with MyChart, go to [email or web address].    Your next appointment:   1  year(s)  Provider:   Olga Millers, MD   Other Instructions [phone]

## 2022-11-28 DIAGNOSIS — Z23 Encounter for immunization: Secondary | ICD-10-CM | POA: Diagnosis not present

## 2022-12-05 ENCOUNTER — Ambulatory Visit (HOSPITAL_COMMUNITY): Payer: Medicare Other | Attending: Cardiology

## 2022-12-05 DIAGNOSIS — I2510 Atherosclerotic heart disease of native coronary artery without angina pectoris: Secondary | ICD-10-CM | POA: Diagnosis not present

## 2022-12-05 DIAGNOSIS — I77819 Aortic ectasia, unspecified site: Secondary | ICD-10-CM

## 2022-12-05 DIAGNOSIS — E785 Hyperlipidemia, unspecified: Secondary | ICD-10-CM

## 2022-12-05 DIAGNOSIS — I10 Essential (primary) hypertension: Secondary | ICD-10-CM

## 2022-12-05 DIAGNOSIS — I428 Other cardiomyopathies: Secondary | ICD-10-CM | POA: Diagnosis not present

## 2022-12-05 DIAGNOSIS — Z952 Presence of prosthetic heart valve: Secondary | ICD-10-CM

## 2022-12-05 LAB — ECHOCARDIOGRAM COMPLETE
Area-P 1/2: 2.51 cm2
MV VTI: 2.18 cm2
S' Lateral: 3.9 cm

## 2022-12-06 ENCOUNTER — Ambulatory Visit (INDEPENDENT_AMBULATORY_CARE_PROVIDER_SITE_OTHER): Payer: Medicare Other | Admitting: Family Medicine

## 2022-12-06 DIAGNOSIS — Z0000 Encounter for general adult medical examination without abnormal findings: Secondary | ICD-10-CM

## 2022-12-06 NOTE — Progress Notes (Signed)
MEDICARE ANNUAL WELLNESS VISIT  12/06/2022  Telephone Visit Disclaimer This Medicare AWV was conducted by telephone due to national recommendations for restrictions regarding the COVID-19 Pandemic (e.g. social distancing).  I verified, using two identifiers, that I am speaking with Corey Mathis or their authorized healthcare agent. I discussed the limitations, risks, security, and privacy concerns of performing an evaluation and management service by telephone and the potential availability of an in-person appointment in the future. The patient expressed understanding and agreed to proceed.  Location of Patient: Home Location of Provider (nurse):  In the office.  Subjective:    Corey Mathis is a 72 y.o. male patient of Metheney, Barbarann Ehlers, MD who had a Medicare Annual Wellness Visit today via telephone. Deldrick is Retired and lives with their spouse. he has 2 children. he reports that he is socially active and does interact with friends/family regularly. he is moderately physically active and enjoys reading and doing house stuff.  Patient Care Team: Agapito Games, MD as PCP - General (Family Medicine) Jens Som Madolyn Frieze, MD as PCP - Cardiology (Cardiology) Agapito Games, MD (Family Medicine)     12/06/2022    8:50 AM 12/02/2021   10:13 AM 10/03/2021   12:05 PM 04/21/2021    9:35 AM 10/29/2020    2:46 PM 02/11/2018    9:27 AM 11/01/2017    9:40 AM  Advanced Directives  Does Patient Have a Medical Advance Directive? Yes Yes Yes Yes Yes No Yes  Type of Advance Directive Living will Living will Living will;Healthcare Power of [employer] Power of Attorney Living will;Healthcare Power of [employer] Power of [employer];Living will  Does patient want to make changes to medical advance directive? No - Patient declined No - Patient declined   No - Patient declined    Copy of Healthcare Power of Attorney in Chart?    No - copy requested No - copy requested   No - copy requested  Would patient like information on creating a medical advance directive?     No - Patient declined No - Patient declined     Hospital Utilization Over the Past 12 Months: # of hospitalizations or ER visits: 1 # of surgeries: 0  Review of Systems    Patient reports that his overall health is better compared to last year.  History obtained from chart review and the patient  Patient Reported Readings (BP, Pulse, CBG, Weight, etc) none Per patient no change in vitals since last visit, unable to obtain new vitals due to telehealth visit  Pain Assessment Pain : No/denies pain Pain Score: 0-No pain     Current Medications & Allergies (verified) Allergies as of 12/06/2022       Reactions   Lisinopril Anaphylaxis, Other (See Comments)   angioedema   Carvedilol Other (See Comments)   wheezing   Ezetimibe-simvastatin Other (See Comments)   Serious vertigo   Propranolol Hcl Other (See Comments)   wheezing   Ramipril Other (See Comments)   wheezing   Codeine Nausea Only        Medication List        Accurate as of December 06, 2022  8:58 AM. If you have any questions, ask your nurse or doctor.          albuterol 108 (90 Base) MCG/ACT inhaler Commonly known as: VENTOLIN HFA Inhale 2 puffs into the lungs every 6 (six) hours as needed for wheezing or shortness of breath.  antiseptic oral rinse Liqd 5 mLs by Mouth Rinse route as needed for dry mouth or mouth pain.   aspirin EC 81 MG tablet Take 81 mg by mouth at bedtime.   atorvastatin 40 MG tablet Commonly known as: LIPITOR Take 1 tablet (40 mg total) by mouth daily.   fluticasone 50 MCG/ACT nasal spray Commonly known as: FLONASE Place 1 spray into both nostrils daily as needed (congestion).   hydrALAZINE 50 MG tablet Commonly known as: APRESOLINE TAKE 1 TABLET BY MOUTH THREE TIMES DAILY   isosorbide mononitrate 60 MG 24 hr tablet Commonly known as: IMDUR Take 1 tablet by mouth once  daily   metoprolol succinate 50 MG 24 hr tablet Commonly known as: TOPROL-XL TAKE 1 TABLET BY MOUTH ONCE DAILY AT BEDTIME WITH 100 MG TABLET FOR A TOTAL OF 150 MG   metoprolol succinate 100 MG 24 hr tablet Commonly known as: TOPROL-XL TAKE 1 TABLET BY MOUTH ONCE DAILY. APPOINTMENT REQUIRED FOR FUTURE REFILLS   multivitamin with minerals Tabs tablet Take 1 tablet by mouth at bedtime.   PARoxetine 30 MG tablet Commonly known as: PAXIL Take 1 tablet by mouth once daily   Trelegy Ellipta 100-62.5-25 MCG/ACT Aepb Generic drug: Fluticasone-Umeclidin-Vilant Inhale 1 puff into the lungs daily.   triamcinolone cream 0.1 % Commonly known as: KENALOG Apply 1 Application topically 2 (two) times daily.   UNABLE TO FIND Med Name: BP safe medication, otc, mult-symptom for cough/congestion.   warfarin 10 MG tablet Commonly known as: COUMADIN Take as directed by the anticoagulation clinic. If you are unsure how to take this medication, talk to your nurse or doctor. Original instructions: TAKE 1 (ONE) TABLET BY MOUTH ONCE DAILY AS DIRECTED BY  COUMADIN  CLINIC        History (reviewed): Past Medical History:  Diagnosis Date   Allergy    Anxiety    Asthma    CHF (congestive heart failure) (HCC)    Clotting disorder (HCC) 1998   Warfarin   COPD (chronic obstructive pulmonary disease) (HCC) 2024   Listed   Dyslipidemia    Heart murmur 1980   Listed   History of mitral valve replacement    Hypertension    Labyrinthitis    hx   Past Surgical History:  Procedure Laterality Date   ACNE CYST REMOVAL     from hand and back   CARDIAC VALVE REPLACEMENT  1998   Listed   COLONOSCOPY WITH PROPOFOL N/A 04/21/2021   Procedure: COLONOSCOPY WITH PROPOFOL;  Surgeon: Lemar Lofty., MD;  Location: [facility] ENDOSCOPY;  Service: Gastroenterology;  Laterality: N/A;   EXTERNAL FIXATION LEG Right 08/11/2017   Procedure: EXTERNAL FIXATION, RIGHT KNEE;  Surgeon: Tarry Kos, MD;  Location:  [facility] OR;  Service: Orthopedics;  Laterality: Right;   FINE NEEDLE ASPIRATION Right 08/11/2017   Procedure: FINE NEEDLE ASPIRATION  of right Knee with 80 cc of dark red fluid removed;  Surgeon: Tarry Kos, MD;  Location: [facility] OR;  Service: Orthopedics;  Laterality: Right;   FLEXIBLE SIGMOIDOSCOPY N/A 02/11/2018   Procedure: FLEXIBLE SIGMOIDOSCOPY;  Surgeon: Sherrilyn Rist, MD;  Location: [facility] ENDOSCOPY;  Service: Gastroenterology;  Laterality: N/A;   FRACTURE SURGERY  2019   Broken legs   Jaw surgery (other)     NASAL SINUS SURGERY N/A 10/16/2012   Procedure: NASAL ENDOSCOPIC POLYPECTOMY/MAXILLARY ANTROSTOMY/ETHMOIDECTOMY;  Surgeon: Serena Colonel, MD;  Location: [facility] OR;  Service: ENT;  Laterality: N/A;   ORIF TIBIA PLATEAU Left 08/13/2017  Procedure: OPEN REDUCTION INTERNAL FIXATION (ORIF) TIBIAL PLATEAU;  Surgeon: Tarry Kos, MD;  Location: [facility] OR;  Service: Orthopedics;  Laterality: Left;   ORIF TIBIA PLATEAU Right 08/22/2017   Procedure: OPEN REDUCTION INTERNAL FIXATION (ORIF) RIGHT BICONDYLAR TIBIAL PLATEAU, REMOVAL OF EX FIX;  Surgeon: Tarry Kos, MD;  Location: [facility] OR;  Service: Orthopedics;  Laterality: Right;   POLYPECTOMY  04/21/2021   Procedure: POLYPECTOMY;  Surgeon: Mansouraty, Netty Starring., MD;  Location: [facility] ENDOSCOPY;  Service: Gastroenterology;;   RIGHT/LEFT HEART CATH AND CORONARY ANGIOGRAPHY N/A 07/12/2017   Procedure: RIGHT/LEFT HEART CATH AND CORONARY ANGIOGRAPHY;  Surgeon: Kathleene Hazel, MD;  Location: [facility] INVASIVE CV LAB;  Service: Cardiovascular;  Laterality: N/A;   SUBMUCOSAL INJECTION  02/11/2018   Procedure: SUBMUCOSAL INJECTION;  Surgeon: Sherrilyn Rist, MD;  Location: [facility] ENDOSCOPY;  Service: Gastroenterology;;   TOOTH EXTRACTION     VALVE REPLACEMENT  03/06/1996   St. Jude, mitral   Family History  Problem Relation Age of Onset   Heart attack Father 70   Heart disease Father    Colon cancer Mother 41   Hypertension Mother    Arthritis Mother     Cancer Mother    Breast cancer Maternal Aunt    Stomach cancer Neg Hx    Esophageal cancer Neg Hx    Inflammatory bowel disease Neg Hx    Liver disease Neg Hx    Pancreatic cancer Neg Hx    Social History   Socioeconomic History   Marital status: Married    Spouse name: Frenchie    Number of children: 2   Years of education: 20   Highest education level: Professional school degree (e.g., MD, DDS, DVM, JD)  Occupational History   Occupation: retired    Associate Professor: [employer]  Tobacco Use   Smoking status: Never   Smokeless tobacco: Never  Vaping Use   Vaping status: Never Used  Substance and Sexual Activity   Alcohol use: Never   Drug use: Never   Sexual activity: Not Currently    Birth control/protection: Abstinence  Other Topics Concern   Not on file  Social History Narrative   Lives alone. He has two children. He enjoys reading and house stuff.   Social Determinants of Health   Financial Resource Strain: Low Risk  (12/04/2022)   Overall Financial Resource Strain (CARDIA)    Difficulty of Paying Living Expenses: Not very hard  Food Insecurity: No Food Insecurity (12/04/2022)   Hunger Vital Sign    Worried About Running Out of Food in the Last Year: Never true    Ran Out of Food in the Last Year: Never true  Transportation Needs: No Transportation Needs (12/04/2022)   PRAPARE - Administrator, Civil Service (Medical): No    Lack of Transportation (Non-Medical): No  Physical Activity: Sufficiently Active (12/04/2022)   Exercise Vital Sign    Days of Exercise per Week: 5 days    Minutes of Exercise per Session: 30 min  Stress: No Stress Concern Present (12/04/2022)   [employer] of Occupational Health - Occupational Stress Questionnaire    Feeling of Stress : Not at all  Social Connections: Moderately Isolated (12/06/2022)   Social Connection and Isolation Panel [NHANES]    Frequency of Communication with Friends and Family: More than three times a week     Frequency of Social Gatherings with Friends and Family: Three times a week    Attends Religious Services: Never  Active Member of Clubs or Organizations: Yes    Attends Banker Meetings: More than 4 times per year    Marital Status: Separated    Activities of Daily Living    12/04/2022    5:08 PM  In your present state of health, do you have any difficulty performing the following activities:  Hearing? 0  Vision? 0  Difficulty concentrating or making decisions? 0  Walking or climbing stairs? 0  Dressing or bathing? 0  Doing errands, shopping? 0  Preparing Food and eating ? N  Using the Toilet? N  In the past six months, have you accidently leaked urine? N  Do you have problems with loss of bowel control? N  Managing your Medications? N  Managing your Finances? N  Housekeeping or managing your Housekeeping? N    Patient Education/ Literacy How often do you need to have someone help you when you read instructions, pamphlets, or other written materials from your doctor or pharmacy?: 1 - Never What is the last grade level you completed in school?: PHD  Exercise    Diet Patient reports consuming  1-1.5  meals a day and 2 snack(s) a day Patient reports that his primary diet is: Regular Patient reports that she does have regular access to food.   Depression Screen    12/06/2022    8:52 AM 09/04/2022    4:11 PM 12/02/2021   10:11 AM 08/26/2021    1:30 PM 02/21/2021    2:04 PM 10/29/2020    2:48 PM 05/29/2019    2:05 PM  PHQ 2/9 Scores  PHQ - 2 Score 0 0 0 0 0 0 0  PHQ- 9 Score       0     Fall Risk    12/06/2022    8:51 AM 12/04/2022    5:08 PM 09/04/2022    4:11 PM 03/10/2022    1:28 PM 12/02/2021   10:11 AM  Fall Risk   Falls in the past year? 0 0 0 0 0  Number falls in past yr: 0 0 0 0 0  Injury with Fall? 0 0  0 0  Risk for fall due to : No Fall Risks  No Fall Risks No Fall Risks No Fall Risks  Follow up Falls evaluation completed  Falls evaluation  completed Falls evaluation completed Falls evaluation completed     Objective:  Corey Mathis seemed alert and oriented and he participated appropriately during our telephone visit.  Blood Pressure Weight BMI  BP Readings from Last 3 Encounters:  11/22/22 (!) 142/84  09/08/22 132/82  09/04/22 (!) 166/83   Wt Readings from Last 3 Encounters:  11/22/22 194 lb 3.2 oz (88.1 kg)  09/08/22 187 lb (84.8 kg)  09/04/22 187 lb 4 oz (84.9 kg)   BMI Readings from Last 1 Encounters:  11/22/22 23.64 kg/m    *Unable to obtain current vital signs, weight, and BMI due to telephone visit type  Hearing/Vision  Muad did not seem to have difficulty with hearing/understanding during the telephone conversation Reports that he has had a formal eye exam by an eye care professional within the past year Reports that he has not had a formal hearing evaluation within the past year *Unable to fully assess hearing and vision during telephone visit type  Cognitive Function:    12/06/2022    8:54 AM 12/02/2021   10:13 AM 10/29/2020    2:55 PM 11/01/2017    9:40 AM 09/05/2016  2:21 PM  6CIT Screen  What Year? 0 points 0 points 0 points 0 points 0 points  What month? 0 points 0 points 0 points 0 points 0 points  What time? 0 points 0 points 0 points 0 points 0 points  Count back from 20 0 points 0 points 0 points 0 points 0 points  Months in reverse 0 points 0 points 0 points 0 points 0 points  Repeat phrase 0 points 0 points 0 points 0 points 2 points  Total Score 0 points 0 points 0 points 0 points 2 points   (Normal:0-7, Significant for Dysfunction: >8)  Normal Cognitive Function Screening: Yes   Immunization & Health Maintenance Record Immunization History  Administered Date(s) Administered   Fluad Quad(high Dose 65+) 10/31/2018, 10/28/2021   Hep A / Hep B 04/27/2005, 01/10/2006, 09/29/2021   Influenza Split 02/10/2011   Influenza, High Dose Seasonal PF 11/04/2016, 11/26/2017, 11/28/2022    Influenza,inj,Quad PF,6+ Mos 01/13/2013, 01/26/2014, 01/12/2015   Influenza-Unspecified 12/26/2019, 11/24/2020   Moderna Covid-19 Fall Seasonal Vaccine 42yrs & older 06/06/2022   PFIZER Comirnaty(Gray Top)Covid-19 Tri-Sucrose Vaccine 11/28/2022   PFIZER(Purple Top)SARS-COV-2 Vaccination 04/07/2019, 04/29/2019, 12/26/2019, 08/16/2020, 02/01/2022   Pfizer Covid-19 Vaccine Bivalent Booster 53yrs & up 11/24/2020   Pneumococcal Conjugate-13 09/23/2013   Pneumococcal Polysaccharide-23 03/13/2011, 05/27/2018   Pneumococcal-Unspecified 05/10/2016   RSV,unspecified 02/07/2022   Tdap 03/07/1999, 08/10/2015, 11/24/2020   Zoster Recombinant(Shingrix) 01/25/2019, 09/29/2021    Health Maintenance  Topic Date Due   COVID-19 Vaccine (9 - 2023-24 season) 01/23/2023   Medicare Annual Wellness (AWV)  12/06/2023   Colonoscopy  04/21/2024   DTaP/Tdap/Td (4 - Td or Tdap) 11/25/2030   Pneumonia Vaccine 15+ Years old  Completed   INFLUENZA VACCINE  Completed   Hepatitis C Screening  Completed   Zoster Vaccines- Shingrix  Completed   HPV VACCINES  Aged Out       Assessment  This is a routine wellness examination for [employer].  Health Maintenance: Due or Overdue There are no preventive care reminders to display for this patient.   Corey Mathis does not need a referral for Community Assistance: Care Management:   no Social Work:    no Prescription Assistance:  no Nutrition/Diabetes Education:  no   Plan:  Personalized Goals  Goals Addressed               This Visit's Progress     Patient Stated (pt-stated)        Patient stated he would like to work on his muscle tone and his breathing.       Personalized Health Maintenance & Screening Recommendations  There are no preventive care reminders to display for this patient.  Lung Cancer Screening Recommended: no (Low Dose CT Chest recommended if Age 40-80 years, 20 pack-year currently smoking OR have quit w/in past 15  years) Hepatitis C Screening recommended: no HIV Screening recommended: no  Advanced Directives: Written information was not prepared per patient's request.  Referrals & Orders No orders of the defined types were placed in this encounter.   Follow-up Plan Follow-up with Agapito Games, MD as planned Medicare wellness visit in one year.  Patient will access AVS on my chart.    I have personally reviewed and noted the following in the patient's chart:   Medical and social history Use of alcohol, tobacco or illicit drugs  Current medications and supplements Functional ability and status Nutritional status Physical activity Advanced directives List of other physicians Hospitalizations,  surgeries, and ER visits in previous 12 months Vitals Screenings to include cognitive, depression, and falls Referrals and appointments  In addition, I have reviewed and discussed with Corey Mathis certain preventive protocols, quality metrics, and best practice recommendations. A written personalized care plan for preventive services as well as general preventive health recommendations is available and can be mailed to the patient at his request.      Modesto Charon, RN BSN  12/06/2022

## 2022-12-06 NOTE — Patient Instructions (Addendum)
MEDICARE ANNUAL WELLNESS VISIT Health Maintenance Summary and Written Plan of Care  Corey Mathis ,  Thank you for allowing me to perform your Medicare Annual Wellness Visit and for your ongoing commitment to your health.   Health Maintenance & Immunization History Health Maintenance  Topic Date Due   COVID-19 Vaccine (9 - 2023-24 season) 01/23/2023   Medicare Annual Wellness (AWV)  12/06/2023   Colonoscopy  04/21/2024   DTaP/Tdap/Td (4 - Td or Tdap) 11/25/2030   Pneumonia Vaccine 26+ Years old  Completed   INFLUENZA VACCINE  Completed   Hepatitis C Screening  Completed   Zoster Vaccines- Shingrix  Completed   HPV VACCINES  Aged Out   Immunization History  Administered Date(s) Administered   Fluad Quad(high Dose 65+) 10/31/2018, 10/28/2021   Hep A / Hep B 04/27/2005, 01/10/2006, 09/29/2021   Influenza Split 02/10/2011   Influenza, High Dose Seasonal PF 11/04/2016, 11/26/2017, 11/28/2022   Influenza,inj,Quad PF,6+ Mos 01/13/2013, 01/26/2014, 01/12/2015   Influenza-Unspecified 12/26/2019, 11/24/2020   Moderna Covid-19 Fall Seasonal Vaccine 7yrs & older 06/06/2022   PFIZER Comirnaty(Gray Top)Covid-19 Tri-Sucrose Vaccine 11/28/2022   PFIZER(Purple Top)SARS-COV-2 Vaccination 04/07/2019, 04/29/2019, 12/26/2019, 08/16/2020, 02/01/2022   Pfizer Covid-19 Vaccine Bivalent Booster 21yrs & up 11/24/2020   Pneumococcal Conjugate-13 09/23/2013   Pneumococcal Polysaccharide-23 03/13/2011, 05/27/2018   Pneumococcal-Unspecified 05/10/2016   RSV,unspecified 02/07/2022   Tdap 03/07/1999, 08/10/2015, 11/24/2020   Zoster Recombinant(Shingrix) 01/25/2019, 09/29/2021    These are the patient goals that we discussed:  Goals Addressed               This Visit's Progress     Patient Stated (pt-stated)        Patient stated he would like to work on his muscle tone and his breathing.         This is a list of Health Maintenance Items that are overdue or due now: There are no preventive  care reminders to display for this patient.   Orders/Referrals Placed Today: No orders of the defined types were placed in this encounter.  (Contact our referral department at [phone] if you have not spoken with someone about your referral appointment within the next 5 days)    Follow-up Plan Follow-up with Agapito Games, MD as planned Medicare wellness visit in one year.  Patient will access AVS on my chart.      Health Maintenance, Male Adopting a healthy lifestyle and getting preventive care are important in promoting health and wellness. Ask your health care provider about: The right schedule for you to have regular tests and exams. Things you can do on your own to prevent diseases and keep yourself healthy. What should I know about diet, weight, and exercise? Eat a healthy diet  Eat a diet that includes plenty of vegetables, fruits, low-fat dairy products, and lean protein. Do not eat a lot of foods that are high in solid fats, added sugars, or sodium. Maintain a healthy weight Body mass index (BMI) is a measurement that can be used to identify possible weight problems. It estimates body fat based on height and weight. Your health care provider can help determine your BMI and help you achieve or maintain a healthy weight. Get regular exercise Get regular exercise. This is one of the most important things you can do for your health. Most adults should: Exercise for at least 150 minutes each week. The exercise should increase your heart rate and make you sweat (moderate-intensity exercise). Do strengthening exercises at least twice a week.  This is in addition to the moderate-intensity exercise. Spend less time sitting. Even light physical activity can be beneficial. Watch cholesterol and blood lipids Have your blood tested for lipids and cholesterol at 74 years of age, then have this test every 5 years. You may need to have your cholesterol levels checked more often  if: Your lipid or cholesterol levels are high. You are older than 74 years of age. You are at high risk for heart disease. What should I know about cancer screening? Many types of cancers can be detected early and may often be prevented. Depending on your health history and family history, you may need to have cancer screening at various ages. This may include screening for: Colorectal cancer. Prostate cancer. Skin cancer. Lung cancer. What should I know about heart disease, diabetes, and high blood pressure? Blood pressure and heart disease High blood pressure causes heart disease and increases the risk of stroke. This is more likely to develop in people who have high blood pressure readings or are overweight. Talk with your health care provider about your target blood pressure readings. Have your blood pressure checked: Every 3-5 years if you are 27-38 years of age. Every year if you are 71 years old or older. If you are between the ages of 84 and 17 and are a current or former smoker, ask your health care provider if you should have a one-time screening for abdominal aortic aneurysm (AAA). Diabetes Have regular diabetes screenings. This checks your fasting blood sugar level. Have the screening done: Once every three years after age 69 if you are at a normal weight and have a low risk for diabetes. More often and at a younger age if you are overweight or have a high risk for diabetes. What should I know about preventing infection? Hepatitis B If you have a higher risk for hepatitis B, you should be screened for this virus. Talk with your health care provider to find out if you are at risk for hepatitis B infection. Hepatitis C Blood testing is recommended for: Everyone born from [birth year] through 1965. Anyone with known risk factors for hepatitis C. Sexually transmitted infections (STIs) You should be screened each year for STIs, including gonorrhea and chlamydia, if: You are sexually  active and are younger than 74 years of age. You are older than 74 years of age and your health care provider tells you that you are at risk for this type of infection. Your sexual activity has changed since you were last screened, and you are at increased risk for chlamydia or gonorrhea. Ask your health care provider if you are at risk. Ask your health care provider about whether you are at high risk for HIV. Your health care provider may recommend a prescription medicine to help prevent HIV infection. If you choose to take medicine to prevent HIV, you should first get tested for HIV. You should then be tested every 3 months for as long as you are taking the medicine. Follow these instructions at home: Alcohol use Do not drink alcohol if your health care provider tells you not to drink. If you drink alcohol: Limit how much you have to 0-2 drinks a day. Know how much alcohol is in your drink. In the [place] one drink equals one 12 oz bottle of beer (355 mL), one 5 oz glass of wine (148 mL), or one 1 oz glass of hard liquor (44 mL). Lifestyle Do not use any products that contain nicotine or tobacco. These  products include cigarettes, chewing tobacco, and vaping devices, such as e-cigarettes. If you need help quitting, ask your health care provider. Do not use street drugs. Do not share needles. Ask your health care provider for help if you need support or information about quitting drugs. General instructions Schedule regular health, dental, and eye exams. Stay current with your vaccines. Tell your health care provider if: You often feel depressed. You have ever been abused or do not feel safe at home. Summary Adopting a healthy lifestyle and getting preventive care are important in promoting health and wellness. Follow your health care provider's instructions about healthy diet, exercising, and getting tested or screened for diseases. Follow your health care provider's instructions on  monitoring your cholesterol and blood pressure. This information is not intended to replace advice given to you by your health care provider. Make sure you discuss any questions you have with your health care provider. Document Revised: 07/12/2020 Document Reviewed: 07/12/2020 Elsevier Patient Education  2024 [employer].

## 2022-12-13 ENCOUNTER — Telehealth: Payer: Self-pay

## 2022-12-13 ENCOUNTER — Ambulatory Visit: Payer: Medicare Other | Attending: Cardiovascular Disease

## 2022-12-13 DIAGNOSIS — Z7901 Long term (current) use of anticoagulants: Secondary | ICD-10-CM

## 2022-12-13 DIAGNOSIS — Z9889 Other specified postprocedural states: Secondary | ICD-10-CM

## 2022-12-13 LAB — POCT INR: INR: 3.5 — AB (ref 2.0–3.0)

## 2022-12-13 NOTE — Patient Instructions (Signed)
Description   Continue taking Warfarin 1 tablet daily.  Stay consistent with greens each week (3 times per week).   Repeat INR in 5 weeks.  [facility] [phone]

## 2022-12-13 NOTE — Telephone Encounter (Signed)
Spoke with pt. Pt was notified of echocardiogram results. Pt will continue current medication and f/u as planned.

## 2022-12-18 DIAGNOSIS — Z03818 Encounter for observation for suspected exposure to other biological agents ruled out: Secondary | ICD-10-CM | POA: Diagnosis not present

## 2022-12-18 DIAGNOSIS — R0602 Shortness of breath: Secondary | ICD-10-CM | POA: Diagnosis not present

## 2022-12-18 DIAGNOSIS — R051 Acute cough: Secondary | ICD-10-CM | POA: Diagnosis not present

## 2022-12-18 DIAGNOSIS — R531 Weakness: Secondary | ICD-10-CM | POA: Diagnosis not present

## 2023-01-01 ENCOUNTER — Other Ambulatory Visit: Payer: Self-pay

## 2023-01-01 DIAGNOSIS — Z9889 Other specified postprocedural states: Secondary | ICD-10-CM

## 2023-01-01 MED ORDER — WARFARIN SODIUM 10 MG PO TABS: ORAL_TABLET | ORAL | 0 refills | Status: DC

## 2023-01-07 ENCOUNTER — Other Ambulatory Visit: Payer: Self-pay | Admitting: Cardiology

## 2023-01-07 DIAGNOSIS — I428 Other cardiomyopathies: Secondary | ICD-10-CM

## 2023-01-17 ENCOUNTER — Ambulatory Visit: Payer: Medicare Other | Attending: Cardiology

## 2023-01-17 DIAGNOSIS — Z9889 Other specified postprocedural states: Secondary | ICD-10-CM | POA: Diagnosis not present

## 2023-01-17 DIAGNOSIS — Z7901 Long term (current) use of anticoagulants: Secondary | ICD-10-CM | POA: Diagnosis not present

## 2023-01-17 LAB — POCT INR: INR: 3.2 — AB (ref 2.0–3.0)

## 2023-01-17 NOTE — Patient Instructions (Signed)
Description   Continue taking Warfarin 1 tablet daily.  Stay consistent with greens each week (3 times per week).   Repeat INR in 6 weeks.  [facility] [phone]

## 2023-03-05 ENCOUNTER — Ambulatory Visit: Payer: Medicare Other | Attending: Internal Medicine

## 2023-03-05 DIAGNOSIS — Z7901 Long term (current) use of anticoagulants: Secondary | ICD-10-CM | POA: Diagnosis not present

## 2023-03-05 DIAGNOSIS — Z9889 Other specified postprocedural states: Secondary | ICD-10-CM | POA: Insufficient documentation

## 2023-03-05 LAB — POCT INR: INR: 5 — AB (ref 2.0–3.0)

## 2023-03-05 NOTE — Patient Instructions (Signed)
HOLD TONIGHT and EAT GREENS then Continue taking Warfarin 1 tablet daily.  Stay consistent with greens each week (3 times per week).   Repeat INR in 3 weeks.  [facility] [phone]

## 2023-03-09 ENCOUNTER — Ambulatory Visit (INDEPENDENT_AMBULATORY_CARE_PROVIDER_SITE_OTHER): Payer: Medicare Other | Admitting: Family Medicine

## 2023-03-09 ENCOUNTER — Encounter: Payer: Self-pay | Admitting: Family Medicine

## 2023-03-09 VITALS — BP 136/72 | HR 65 | Ht 76.0 in | Wt 188.0 lb

## 2023-03-09 DIAGNOSIS — I10 Essential (primary) hypertension: Secondary | ICD-10-CM | POA: Diagnosis not present

## 2023-03-09 DIAGNOSIS — E7800 Pure hypercholesterolemia, unspecified: Secondary | ICD-10-CM | POA: Diagnosis not present

## 2023-03-09 DIAGNOSIS — F411 Generalized anxiety disorder: Secondary | ICD-10-CM | POA: Diagnosis not present

## 2023-03-09 DIAGNOSIS — R790 Abnormal level of blood mineral: Secondary | ICD-10-CM

## 2023-03-09 DIAGNOSIS — R918 Other nonspecific abnormal finding of lung field: Secondary | ICD-10-CM | POA: Diagnosis not present

## 2023-03-09 DIAGNOSIS — R0602 Shortness of breath: Secondary | ICD-10-CM | POA: Diagnosis not present

## 2023-03-09 DIAGNOSIS — D509 Iron deficiency anemia, unspecified: Secondary | ICD-10-CM | POA: Diagnosis not present

## 2023-03-09 DIAGNOSIS — R7301 Impaired fasting glucose: Secondary | ICD-10-CM | POA: Diagnosis not present

## 2023-03-09 DIAGNOSIS — J42 Unspecified chronic bronchitis: Secondary | ICD-10-CM

## 2023-03-09 LAB — POCT GLYCOSYLATED HEMOGLOBIN (HGB A1C): Hemoglobin A1C: 5.7 % — AB (ref 4.0–5.6)

## 2023-03-09 MED ORDER — ALBUTEROL SULFATE HFA 108 (90 BASE) MCG/ACT IN AERS: 2.0000 | INHALATION_SPRAY | Freq: Four times a day (QID) | RESPIRATORY_TRACT | 1 refills | Status: DC | PRN

## 2023-03-09 NOTE — Assessment & Plan Note (Signed)
 Continue current dose of Paxil.  Follow-up in 6 months.  No concerns.

## 2023-03-09 NOTE — Assessment & Plan Note (Signed)
Due to recheck lipid levels.

## 2023-03-09 NOTE — Progress Notes (Signed)
 Established Patient Office Visit  Subjective  Patient ID: Corey Mathis, male    DOB: 10-10-1950  Age: 73 y.o. MRN: [id number]  Chief Complaint  Patient presents with   Hypertension   ifg    HPI Impaired fasting glucose-no increased thirst or urination. No symptoms consistent with hypoglycemia.  Hypertension- Pt denies chest pain, SOB, dizziness, or heart palpitations.  Taking meds as directed w/o problems.  Denies medication side effects.    Low ferritin-he does take his iron  occasionally but not often because it is constipating.  Still struggling with shortness of breath with activities and especially if he is carrying something.      ROS    Objective:     BP 136/72 (BP Location: Left Arm, Cuff Size: Normal)   Pulse 65   Ht 6' 4 (1.93 m)   Wt 188 lb (85.3 kg)   SpO2 97%   BMI 22.88 kg/m    Physical Exam Vitals and nursing note reviewed.  Constitutional:      Appearance: Normal appearance.  HENT:     Head: Normocephalic and atraumatic.  Eyes:     Conjunctiva/sclera: Conjunctivae normal.  Cardiovascular:     Rate and Rhythm: Normal rate and regular rhythm.  Pulmonary:     Effort: Pulmonary effort is normal.     Breath sounds: Normal breath sounds.  Skin:    General: Skin is warm and dry.  Neurological:     Mental Status: He is alert.  Psychiatric:        Mood and Affect: Mood normal.      Results for orders placed or performed in visit on 03/09/23  POCT HgB A1C  Result Value Ref Range   Hemoglobin A1C 5.7 (A) 4.0 - 5.6 %   HbA1c POC (<> result, manual entry)     HbA1c, POC (prediabetic range)     HbA1c, POC (controlled diabetic range)        The ASCVD Risk score (Arnett DK, et al., 2019) failed to calculate for the following reasons:   The valid total cholesterol range is 130 to 320 mg/dL    Assessment & Plan:   Problem List Items Addressed This Visit       Cardiovascular and Mediastinum   Essential hypertension - Primary    Blood pressure little elevated we will recheck before he goes today.      Relevant Orders   CMP14+EGFR   Lipid panel   TSH     Respiratory   Chronic bronchitis (HCC)   Will place new referral to pulmonology he gets short of breath with certain activities.  He follows with cardiology regularly so he feels like it is coming more from his lungs.  He does need a refill on his albuterol  today.        Endocrine   IFG (impaired fasting glucose)   1C looks great today at 5.7.  Keep up the good work.  Follow-up in 6 months.      Relevant Orders   POCT HgB A1C (Completed)   CMP14+EGFR   Lipid panel   TSH     Other   Iron  deficiency anemia   Due to recheck iron  levels.  He does occasionally take his iron  but it is causing some constipation so we discussed using a stool softener.  But if levels are adequate then he can hopefully discontinue it.      Relevant Orders   Fe+TIBC+Fer   Hyperlipidemia   Due to recheck lipid  levels.      Anxiety state   Continue current dose of Paxil .  Follow-up in 6 months.  No concerns.      Other Visit Diagnoses       SOB (shortness of breath)       Relevant Orders   Ambulatory referral to Pulmonology   TSH     Pulmonary nodules       Relevant Orders   Ambulatory referral to Pulmonology     Low ferritin       Relevant Orders   CBC   Fe+TIBC+Fer       Recheck iron  levels today.  Return in about 6 months (around 09/06/2023) for Hypertension, Pre-diabetes.    Dorothyann Byars, MD

## 2023-03-09 NOTE — Assessment & Plan Note (Signed)
 Blood pressure little elevated we will recheck before he goes today.

## 2023-03-09 NOTE — Assessment & Plan Note (Signed)
 Will place new referral to pulmonology he gets short of breath with certain activities.  He follows with cardiology regularly so he feels like it is coming more from his lungs.  He does need a refill on his albuterol today.

## 2023-03-09 NOTE — Assessment & Plan Note (Signed)
 1C looks great today at 5.7.  Keep up the good work.  Follow-up in 6 months.

## 2023-03-09 NOTE — Assessment & Plan Note (Addendum)
 Due to recheck iron levels.  He does occasionally take his iron but it is causing some constipation so we discussed using a stool softener.  But if levels are adequate then he can hopefully discontinue it.

## 2023-03-09 NOTE — Patient Instructions (Signed)
  [facility]  [address], [address] [place] [address] [phone]

## 2023-03-10 LAB — CMP14+EGFR
ALT: 26 [IU]/L (ref 0–44)
AST: 39 [IU]/L (ref 0–40)
Albumin: 4.7 g/dL (ref 3.8–4.8)
Alkaline Phosphatase: 102 [IU]/L (ref 44–121)
BUN/Creatinine Ratio: 9 — ABNORMAL LOW (ref 10–24)
BUN: 8 mg/dL (ref 8–27)
Bilirubin Total: 0.7 mg/dL (ref 0.0–1.2)
CO2: 26 mmol/L (ref 20–29)
Calcium: 9.6 mg/dL (ref 8.6–10.2)
Chloride: 99 mmol/L (ref 96–106)
Creatinine, Ser: 0.85 mg/dL (ref 0.76–1.27)
Globulin, Total: 3.1 g/dL (ref 1.5–4.5)
Glucose: 74 mg/dL (ref 70–99)
Potassium: 4.9 mmol/L (ref 3.5–5.2)
Sodium: 136 mmol/L (ref 134–144)
Total Protein: 7.8 g/dL (ref 6.0–8.5)
eGFR: 91 mL/min/{1.73_m2} (ref >59)

## 2023-03-10 LAB — LIPID PANEL
Chol/HDL Ratio: 3.9 {ratio} (ref 0.0–5.0)
Cholesterol, Total: 148 mg/dL (ref 100–199)
HDL: 38 mg/dL — ABNORMAL LOW (ref >39)
LDL Chol Calc (NIH): 90 mg/dL (ref 0–99)
Triglycerides: 110 mg/dL (ref 0–149)
VLDL Cholesterol Cal: 20 mg/dL (ref 5–40)

## 2023-03-10 LAB — TSH: TSH: 2.05 u[IU]/mL (ref 0.450–4.500)

## 2023-03-10 LAB — CBC
Hematocrit: 46 % (ref 37.5–51.0)
Hemoglobin: 15.1 g/dL (ref 13.0–17.7)
MCH: 29.6 pg (ref 26.6–33.0)
MCHC: 32.8 g/dL (ref 31.5–35.7)
MCV: 90 fL (ref 79–97)
Platelets: 185 10*3/uL (ref 150–450)
RBC: 5.1 x10E6/uL (ref 4.14–5.80)
RDW: 13.5 % (ref 11.6–15.4)
WBC: 5.1 10*3/uL (ref 3.4–10.8)

## 2023-03-10 LAB — IRON,TIBC AND FERRITIN PANEL
Ferritin: 61 ng/mL (ref 30–400)
Iron Saturation: 17 % (ref 15–55)
Iron: 62 ug/dL (ref 38–169)
Total Iron Binding Capacity: 371 ug/dL (ref 250–450)
UIBC: 309 ug/dL (ref 111–343)

## 2023-03-12 NOTE — Progress Notes (Signed)
 Hi Corey Mathis, sodium levels and potassium and chloride levels look normal this time.  Liver functions also normal.  LDL cholesterol is under 100 which is great.  Your blood count is normal no sign of anemia similar to a year ago.  And your iron  stores look much better compared to a year ago.  You can decrease how often you are taking the iron  if you would like.  Thyroid  level looks great.

## 2023-03-13 ENCOUNTER — Other Ambulatory Visit: Payer: Self-pay | Admitting: Cardiology

## 2023-03-14 ENCOUNTER — Other Ambulatory Visit: Payer: Self-pay

## 2023-03-14 DIAGNOSIS — I428 Other cardiomyopathies: Secondary | ICD-10-CM

## 2023-03-14 MED ORDER — METOPROLOL SUCCINATE ER 50 MG PO TB24: ORAL_TABLET | ORAL | 2 refills | Status: DC

## 2023-03-14 MED ORDER — METOPROLOL SUCCINATE ER 100 MG PO TB24: ORAL_TABLET | ORAL | 2 refills | Status: DC

## 2023-03-22 ENCOUNTER — Telehealth: Payer: Self-pay | Admitting: Cardiology

## 2023-03-22 NOTE — Telephone Encounter (Signed)
Spoke with pt, Follow up scheduled  

## 2023-03-22 NOTE — Telephone Encounter (Signed)
Called and spoke to patient. Verified name and DOB. Patient is reporting wheezing daily all day that has gotten worse over the past 2 weeks. He noticed wheezing about 2 months ago. He is also having SOB on exertion that is increasingly getting worse. He was seen by his PCP who changed his inhaler because he thought it may be coming from his asthma. Patient stated he think it may be coming from the Metoprolol. He is currently on 150 mg a day, 100 mg in the morning and 50 mg at night. Patient said he was taken off of Lisinopril and Carvedilol for similar symptoms. Please advise.

## 2023-03-22 NOTE — Telephone Encounter (Signed)
Pt c/o medication issue:  1. Name of Medication:   metoprolol succinate (TOPROL-XL) 100 MG 24 hr tablet    2. How are you currently taking this medication (dosage and times per day)? TAKE 1 TABLET BY MOUTH ONCE DAILY AT BEDTIME WITH 50 MG TABLET FOR A TOTAL OF 150 MG   3. Are you having a reaction (difficulty breathing--STAT)? No  4. What is your medication issue? Pt states that medication is causing him to Wheeze. He would like a c/b regarding this matter. Please advise

## 2023-03-28 ENCOUNTER — Ambulatory Visit: Payer: Medicare Other | Attending: Cardiology | Admitting: *Deleted

## 2023-03-28 ENCOUNTER — Encounter: Payer: Self-pay | Admitting: Physician Assistant

## 2023-03-28 ENCOUNTER — Ambulatory Visit: Payer: Medicare Other

## 2023-03-28 ENCOUNTER — Ambulatory Visit (INDEPENDENT_AMBULATORY_CARE_PROVIDER_SITE_OTHER): Payer: Medicare Other | Admitting: Emergency Medicine

## 2023-03-28 VITALS — BP 136/88 | HR 73 | Ht 76.0 in | Wt 189.2 lb

## 2023-03-28 DIAGNOSIS — I428 Other cardiomyopathies: Secondary | ICD-10-CM

## 2023-03-28 DIAGNOSIS — I77819 Aortic ectasia, unspecified site: Secondary | ICD-10-CM | POA: Insufficient documentation

## 2023-03-28 DIAGNOSIS — Z79899 Other long term (current) drug therapy: Secondary | ICD-10-CM | POA: Diagnosis not present

## 2023-03-28 DIAGNOSIS — Z9889 Other specified postprocedural states: Secondary | ICD-10-CM | POA: Insufficient documentation

## 2023-03-28 DIAGNOSIS — I10 Essential (primary) hypertension: Secondary | ICD-10-CM

## 2023-03-28 DIAGNOSIS — E785 Hyperlipidemia, unspecified: Secondary | ICD-10-CM

## 2023-03-28 DIAGNOSIS — R0609 Other forms of dyspnea: Secondary | ICD-10-CM

## 2023-03-28 DIAGNOSIS — Z952 Presence of prosthetic heart valve: Secondary | ICD-10-CM

## 2023-03-28 DIAGNOSIS — I2510 Atherosclerotic heart disease of native coronary artery without angina pectoris: Secondary | ICD-10-CM

## 2023-03-28 DIAGNOSIS — Z7901 Long term (current) use of anticoagulants: Secondary | ICD-10-CM | POA: Diagnosis not present

## 2023-03-28 LAB — POCT INR: INR: 5.3 — AB (ref 2.0–3.0)

## 2023-03-28 NOTE — Patient Instructions (Signed)
Description   Do not take any warfarin today and no warfarin tomorrow then START taking Warfarin 1 tablet daily except 1/2 on Sundays.  Stay consistent with greens each week (3 times per week).   Repeat INR in 2 weeks.  [facility] [phone]

## 2023-03-28 NOTE — Patient Instructions (Signed)
Medication Instructions:  STOP METOPROLOL SUCCINATE 50 MG  *If you need a refill on your cardiac medications before your next appointment, please call your pharmacy*   Lab Work: NO LABS If you have labs (blood work) drawn today and your tests are completely normal, you will receive your results only by: MyChart Message (if you have MyChart) OR A paper copy in the mail If you have any lab test that is abnormal or we need to change your treatment, we will call you to review the results.   Testing/Procedures: NO TESTING   Follow-Up: At [facility], you and your health needs are our priority.  As part of our continuing mission to provide you with exceptional heart care, we have created designated Provider Care Teams.  These Care Teams include your primary Cardiologist (physician) and Advanced Practice Providers (APPs -  Physician Assistants and Nurse Practitioners) who all work together to provide you with the care you need, when you need it.    Your next appointment:   3 month(s)  Provider:   Azalee Course, PA    Other Instructions

## 2023-03-28 NOTE — Progress Notes (Unsigned)
Cardiology Office Note:    Date:  03/28/2023  ID:  Corey Mathis, DOB Sep 30, 1950, MRN [id number] PCP: Agapito Games, MD  [facility] Providers Cardiologist:  Olga Millers, MD { Click to update primary MD,subspecialty MD or APP then REFRESH:1}    {Click to Open Review  :1}   Patient Profile:      Corey Mathis is a 73 year old male with visit pertinent history of mechanical mitral valve replacement on Coumadin, CAD, NICM, aortic root dilation, HTN, HLD.   He has history of mitral valve replacement with a Saint Jude mechanical valve in 1998 on Coumadin.  Abdominal ultrasound in November 2015 showed no aneurysm.  Echocardiogram December 2018 showed LVEF 35-40% with diffuse hypokinesis, dilated acing aorta measuring 43 mm, stable mechanical mitral valve.  Cardiac catheterization in May 2019 showed moderate nonobstructive CAD, EF greater than 65%.  Most recent echocardiogram in June 2022 showed EF 45-50%, mild LVH, moderate biatrial enlargement, stable mitral valve replacement, mild dilated aortic root at 40 mm.  CTA in August 2022 showed 4.1 cm ascending aortic aneurysm.  CT of chest aorta in July 2024 showed stable aortic dilation measuring 4.1 cm.  There was evidence of thickened bronchial walls and groundglass opacities bilaterally, he was referred to pulmonology.  Last seen in clinic by Irving Burton, NP on 11/22/2022.  He was doing well from a cardiac standpoint and notes stable mild dyspnea without exertional chest pain.  Routine echocardiogram ordered to follow MVR completed on 12/05/2022 showing LVEF 45-50%, global hypokinesis, mild LVH, RV SF mildly reduced, mechanical mitral valve stable, mild calcification of aortic valve, mild dilation of ascending aorta measuring 43 mm.  On 03/22/2023 he called the nurse triage line noting wheezing over the past 2 months.  He noted his PCP a change in inhaler because his thought was coming from his asthma however patient was concerned that may  be coming from his metoprolol.  He was scheduled for a OV.      History of Present Illness:  Discussed the use of AI scribe software for clinical note transcription with the patient, who gave verbal consent to proceed.  Corey Mathis is a 73 y.o. male who returns for acute visit for DOE.   He presents with worsening shortness of breath and wheezing. These symptoms have been ongoing for a couple of years but have become more pronounced in the last 5-6 months. The patient reports that the symptoms are particularly noticeable during exertion, such as when carrying something upstairs or pushing a lawnmower. The patient has also noticed an association between an increase in metoprolol dosage a year or two ago and a worsening of these symptoms. He denies SOB at rest and is without any exertional angina.   He has a history of acute bronchitis and asthma is scheduled to establish care with a pulmonologist in February.  He denies chest pain, shortness of breath at rest, lower extremity edema, fatigue, palpitations, melena, hematuria, hemoptysis, diaphoresis, weakness, presyncope, syncope, orthopnea, and PND.     Review of Systems  Constitutional: Negative for weight gain and weight loss.  Cardiovascular:  Positive for dyspnea on exertion. Negative for chest pain, claudication, irregular heartbeat, leg swelling, near-syncope, orthopnea, palpitations, paroxysmal nocturnal dyspnea and syncope.  Respiratory:  Positive for wheezing. Negative for cough, hemoptysis and shortness of breath.   Gastrointestinal:  Negative for abdominal pain, hematochezia and melena.  Genitourinary:  Negative for hematuria.  Neurological:  Negative for dizziness and light-headedness.  See HPI     Home Medications:    Prior to Admission medications   Medication Sig Start Date End Date Taking? Authorizing Provider  albuterol (VENTOLIN HFA) 108 (90 Base) MCG/ACT inhaler Inhale 2 puffs into the lungs every 6 (six) hours as  needed for wheezing or shortness of breath. 03/09/23   Agapito Games, MD  antiseptic oral rinse (BIOTENE) LIQD 5 mLs by Mouth Rinse route as needed for dry mouth or mouth pain.    [provider]  aspirin 81 MG EC tablet Take 81 mg by mouth at bedtime.    [provider]  atorvastatin (LIPITOR) 40 MG tablet Take 1 tablet (40 mg total) by mouth daily. 10/18/22   Lewayne Bunting, MD  fluticasone (FLONASE) 50 MCG/ACT nasal spray Place 1 spray into both nostrils daily as needed (congestion). 07/25/19   Hall-Potvin, [place], PA-C  Fluticasone-Umeclidin-Vilant (TRELEGY ELLIPTA) 100-62.5-25 MCG/ACT AEPB Inhale 1 puff into the lungs daily. 10/06/22   Agapito Games, MD  hydrALAZINE (APRESOLINE) 50 MG tablet TAKE 1 TABLET BY MOUTH THREE TIMES DAILY 10/31/22   Lewayne Bunting, MD  isosorbide mononitrate (IMDUR) 60 MG 24 hr tablet Take 1 tablet by mouth once daily 01/09/23   Lewayne Bunting, MD  metoprolol succinate (TOPROL-XL) 100 MG 24 hr tablet TAKE 1 TABLET BY MOUTH ONCE DAILY AT BEDTIME WITH 50 MG TABLET FOR A TOTAL OF 150 MG 03/14/23   Monge, Emily C, NP  metoprolol succinate (TOPROL-XL) 50 MG 24 hr tablet TAKE 1 TABLET BY MOUTH ONCE DAILY AT BEDTIME WITH 100 MG TABLET FOR A TOTAL OF 150 MG 03/14/23   Bernadene Person C, NP  Multiple Vitamin (MULTIVITAMIN WITH MINERALS) TABS tablet Take 1 tablet by mouth at bedtime.    [provider]  PARoxetine (PAXIL) 30 MG tablet Take 1 tablet by mouth once daily 10/10/22   Agapito Games, MD  triamcinolone cream (KENALOG) 0.1 % Apply 1 Application topically 2 (two) times daily. 03/10/22   Agapito Games, MD  UNABLE TO FIND Med Name: BP safe medication, otc, mult-symptom for cough/congestion.    [provider]  warfarin (COUMADIN) 10 MG tablet TAKE 1 (ONE) TABLET BY MOUTH ONCE DAILY AS DIRECTED BY  COUMADIN  CLINIC 01/01/23   Lewayne Bunting, MD   Studies Reviewed:       Echocardiogram 12/05/2022   1. Left  ventricular ejection fraction, by estimation, is 45 to 50%. The  left ventricle has mildly decreased function. The left ventricle  demonstrates global hypokinesis. There is mild concentric left ventricular  hypertrophy. Left ventricular diastolic  parameters are indeterminate.   2. Right ventricular systolic function is mildly reduced. The right  ventricular size is normal. Tricuspid regurgitation signal is inadequate  for assessing PA pressure.   3. Left atrial size was mildly dilated.   4. Mechanical mitral valve. Mean gradient 3 mmHg (within normal limits),  no significant peri-valvular regurgitation.   5. The aortic valve is tricuspid. There is mild calcification of the  aortic valve. Aortic valve regurgitation is not visualized. No aortic  stenosis is present.   6. Aortic dilatation noted. There is mild dilatation of the ascending  aorta, measuring 43 mm.   7. The inferior vena cava is normal in size with greater than 50%  respiratory variability, suggesting right atrial pressure of 3 mmHg.   R/L Cardiac Catheterization 07/22/2017 1. 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 The mid vessel has  a smooth 50% stenosis. The vessel prior to the lesion and distal to the lesion is very large. The stenosis is not flow limiting as there is a large luminal area.  2. The Circumflex is a large vessel with a large obtuse marginal branch and a large intermediate branch. No obstructive disease in this system 3. The RCA is a very large dominant vessel with mild non-obstructive plaque.  4. LV systolic function is normal with LVEF estimated at 60-65%.  5. The mitral valve leaflets are opening well. Trivial MR.  6. Normal right and left heart filling pressures Diagnostic Dominance: Right  Risk Assessment/Calculations:             Physical Exam:   VS:  BP 136/88 (BP Location: Left Arm, Patient Position: Sitting, Cuff Size: Normal)   Pulse 73   Ht 6\' 4"  (1.93 m)   Wt 189 lb  3.2 oz (85.8 kg)   SpO2 92%   BMI 23.03 kg/m    Wt Readings from Last 3 Encounters:  03/28/23 189 lb 3.2 oz (85.8 kg)  03/09/23 188 lb (85.3 kg)  11/22/22 194 lb 3.2 oz (88.1 kg)    Constitutional:      Appearance: Normal and healthy appearance. Not in distress.  Neck:     Vascular: JVD normal.  Pulmonary:     Effort: Pulmonary effort is normal.     Breath sounds: Normal breath sounds.  Chest:     Chest wall: Not tender to palpatation.  Cardiovascular:     PMI at left midclavicular line. Normal rate. Regular rhythm. Normal S1. Normal S2.      Murmurs: There is no murmur.     No gallop.  No click. No rub.  Pulses:    Intact distal pulses.  Edema:    Peripheral edema absent.  Musculoskeletal: Normal range of motion.     Cervical back: Normal range of motion and neck supple. Skin:    General: Skin is warm and dry.  Neurological:     General: No focal deficit present.     Mental Status: Alert, oriented to person, place, and time and oriented to person, place and time.  Psychiatric:        Mood and Affect: Mood and affect normal.        Behavior: Behavior is cooperative.        Thought Content: Thought content normal.        Assessment and Plan:  Dyspnea on exertion  Increased over the past 5-6 months, but present for several of years. Noted to be worse with exertion such as carrying items or going upstairs. No associated chest pain or exertional angina. Patient has a history of asthma/chronic bronchitis and believes symptoms may have worsened with increase in Metoprolol dose. He does have multiple BB listed in allergies to cause wheezing  Not officially dx with COPD but does have hx of smoking  -Cath 07/2017 with nonobstructive CAD -With no exertional angina and ongoing chronic DOE there is no indication for ischemic evaluation at this time  -Lower Metoprolol-XL 150mg  daily to 100mg  daily  -Establish care with pulmonology in February   S/p MVR Most recent echocardiogram  12/2022 showed stable mitral valve replacement.  Continue SBE prophylaxis  -Continue Coumadin   CAD Cardiac catheterization in May 2019 showed moderate nonobstructive CAD, EF greater than 65%.  Stable with no anginal symptoms, no indication for ischemic evaluation  -Continue Aspirin 81mg  daily, Hydralazine 50mg  TID, Imdur 60mg  daily, Atorvastatin 40mg  daily.  Start Metoprolol-XL 100mg  daily as noted above  (previously 150mg  daily)  NICM Most recent echocardiogram 12/05/2022 showing LVEF 45-50%, global hypokinesis, mild LVH, RV SF normal Euvolemic and well compensated on exam No leg swelling, orthopnea, SOB at rest, weight gain -GDMT limited due to history of anaphylaxis/angioedema to ACEi.  Potassium levels range 4.7 - 4.9 so unable to start MRA at this time.  -Continue Metoprolol-XL 100mg  daily  Ascending aorta dilation  CT of the chest aorta per PCP in 09/2022 showed stable aortic dilation measuring 4.1 cm. Echo 12/2022 showed ascending aorta dilation of 43mm.  -Repeat CT chest recommended for 09/2023.   Hypertension  BP today 130/88 and under good control.  He notes blood pressure averages 130/80 at home. -Continue to monitor BP at home -Continue current antihypertensive regimen  Hyperlipidemia  LDL 90 in 03/2023, not at goal -Given history of CAD would like to see LDL cholesterol less than 70.  Focus on heart healthy dieting and increasing fiber, fruits, vegetables in diet. -Exercise 150 minutes of moderate intensity weekly        Dispo:  Return in about 3 months (around 06/26/2023).  Signed, Denyce Robert, NP

## 2023-04-04 ENCOUNTER — Other Ambulatory Visit: Payer: Self-pay | Admitting: Family Medicine

## 2023-04-04 DIAGNOSIS — F411 Generalized anxiety disorder: Secondary | ICD-10-CM

## 2023-04-11 ENCOUNTER — Ambulatory Visit: Payer: Medicare Other | Attending: Cardiology

## 2023-04-11 DIAGNOSIS — Z9889 Other specified postprocedural states: Secondary | ICD-10-CM | POA: Insufficient documentation

## 2023-04-11 DIAGNOSIS — Z7901 Long term (current) use of anticoagulants: Secondary | ICD-10-CM | POA: Insufficient documentation

## 2023-04-11 LAB — POCT INR: INR: 1.9 — AB (ref 2.0–3.0)

## 2023-04-11 NOTE — Patient Instructions (Signed)
 Description   Take 1.5 tablets today and then resume taking Warfarin 1 tablet daily except 1/2 on Sundays.  Stay consistent with greens each week (3 times per week).   Repeat INR in 2 weeks.  Coumadin  Clinic [phone]

## 2023-04-19 ENCOUNTER — Institutional Professional Consult (permissible substitution): Payer: Medicare Other | Admitting: Pulmonary Disease

## 2023-04-25 ENCOUNTER — Ambulatory Visit: Payer: Medicare Other

## 2023-04-26 ENCOUNTER — Other Ambulatory Visit: Payer: Self-pay | Admitting: Cardiology

## 2023-05-03 ENCOUNTER — Ambulatory Visit: Payer: Medicare Other | Attending: Cardiology

## 2023-05-03 ENCOUNTER — Other Ambulatory Visit: Payer: Self-pay | Admitting: Cardiology

## 2023-05-03 DIAGNOSIS — Z7901 Long term (current) use of anticoagulants: Secondary | ICD-10-CM | POA: Diagnosis not present

## 2023-05-03 DIAGNOSIS — Z9889 Other specified postprocedural states: Secondary | ICD-10-CM | POA: Insufficient documentation

## 2023-05-03 LAB — POCT INR: INR: 2.3 (ref 2.0–3.0)

## 2023-05-03 NOTE — Patient Instructions (Signed)
 Description   Take 2 tablets today and then resume taking Warfarin 1 tablet daily except 1/2 on Sundays.  Stay consistent with greens each week (3 times per week).   Repeat INR in 2 weeks.  Coumadin Clinic [phone]

## 2023-05-04 ENCOUNTER — Other Ambulatory Visit: Payer: Self-pay | Admitting: Cardiology

## 2023-05-04 DIAGNOSIS — Z9889 Other specified postprocedural states: Secondary | ICD-10-CM

## 2023-05-09 ENCOUNTER — Encounter: Payer: Self-pay | Admitting: Emergency Medicine

## 2023-05-09 ENCOUNTER — Ambulatory Visit (INDEPENDENT_AMBULATORY_CARE_PROVIDER_SITE_OTHER): Payer: Medicare Other | Admitting: Emergency Medicine

## 2023-05-09 VITALS — BP 146/80 | HR 74 | Ht 76.0 in | Wt 181.4 lb

## 2023-05-09 DIAGNOSIS — J4530 Mild persistent asthma, uncomplicated: Secondary | ICD-10-CM | POA: Diagnosis not present

## 2023-05-09 DIAGNOSIS — R918 Other nonspecific abnormal finding of lung field: Secondary | ICD-10-CM | POA: Diagnosis not present

## 2023-05-09 DIAGNOSIS — K219 Gastro-esophageal reflux disease without esophagitis: Secondary | ICD-10-CM | POA: Diagnosis not present

## 2023-05-09 NOTE — Assessment & Plan Note (Signed)
 Gastroesophageal Reflux Disease (GERD) Reports heartburn and chest discomfort, currently managing with dietary modifications and occasional baking soda. -Discuss potential benefits of medical management for GERD in relation to asthma control at follow-up.

## 2023-05-09 NOTE — Assessment & Plan Note (Signed)
 Asthma Longstanding history, currently on Trelegy and Albuterol as needed. Reports improvement with Trelegy. Noted wheezing on exam and morning congestion. Possible contribution from environmental factors (books, dust). -Continue Trelegy and Albuterol as needed. -Order pulmonary function testing to assess current airflow and lung capacity. -Consider impact of reflux on asthma control.

## 2023-05-09 NOTE — Assessment & Plan Note (Signed)
 Groundglass pulmonary nodules of unclear significance, stable since 2023.  Consider inflammatory process, also must consider possible adenocarcinoma although certainly less likely.  Need to be followed for at least 5 years.  He gets his surveillance CT scans in July-August for his aorta.  We will follow-up with him after that scan to evaluate the nodules and discuss.

## 2023-05-09 NOTE — Progress Notes (Signed)
 Subjective:    Patient ID: Corey Mathis, male    DOB: 07/25/1950, 73 y.o.   MRN: [id number]  HPI Corey Mathis is a 73 year old male with hypertension, mitral valve replacement, congestive heart failure, and asthma who presents with shortness of breath and wheezing.  He has been experiencing progressive shortness of breath over the past five to six months, particularly noticeable during activities such as carrying items or pushing a lawnmower. He also experiences associated wheezing. An echocardiogram on December 05, 2022, indicated decreased left ventricular function consistent with nonischemic cardiomyopathy, while his mitral valve remained stable. He was recently seen by cardiology, and his metoprolol dosage was decreased due to concerns it might be contributing to his dyspnea and fatigue.  He has a long-standing history of asthma, diagnosed in his twenties, and has been on various medications over the years. He switched from Advair to Trelegy about six months ago, which he feels has reduced his wheezing. He uses albuterol two to three times a day as needed for congestion and wheezing, and Flonase as needed for nasal congestion. He also uses Coricidin and Mucinex occasionally. He experiences a morning cough with light, clear mucus and reports persistent congestion throughout the day. Physical activities like walking fast, climbing stairs, or working in the yard can lead to shortness of breath. He has a history of nasal polyps and uses Flonase for allergic rhinitis.  He has a known aortic aneurysm and undergoes regular surveillance CT scans. The most recent scan on September 18, 2022, showed normal heart size, no pericardial effusion, and some biapical pleural and parenchymal scarring with bronchial wall thickening. There were scattered ground glass opacities bilaterally, including an 8 mm right middle lobe ground glass nodule and a stable 3 mm nodule in the left upper lobe.  He reports symptoms of  reflux, particularly after eating, which he manages with baking soda a couple of times a week. He acknowledges that reflux may exacerbate his asthma symptoms. He has a history of smoking, having quit 30-35 years ago after smoking for about 10 years, but denies significant tobacco exposure since then. 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   RADIOLOGY Chest CT: Normal heart size without pericardial effusion; no mediastinal or hilar lymphadenopathy; biapical pleural and parenchymal scarring with associated bronchial wall thickening; scattered ground glass opacities bilaterally; 8 mm right middle lobe ground glass nodule; stable 3 mm nodule in left upper lobe (09/18/2022).  DIAGNOSTIC Echocardiogram: Decreased left ventricular function consistent with nonischemic cardiomyopathy; stable mitral valve (12/05/2022).  Review of Systems As per HPI  Past Medical History:  Diagnosis Date   Allergy    Anxiety    Asthma    CHF (congestive heart failure) (HCC)    Clotting disorder (HCC) 1998   Warfarin   COPD (chronic obstructive pulmonary disease) (HCC) 2024   Listed   Dyslipidemia    GERD (gastroesophageal reflux disease) 05/09/2023   Heart murmur 1980   Listed   History of mitral valve replacement    Hypertension    Labyrinthitis    hx     Family History  Problem Relation Age of Onset   Heart attack Father 79   Heart disease Father    Colon cancer Mother 21   Hypertension Mother    Arthritis Mother    Cancer Mother    Breast cancer Maternal Aunt    Stomach cancer Neg Hx    Esophageal cancer Neg  Hx    Inflammatory bowel disease Neg Hx    Liver disease Neg Hx    Pancreatic cancer Neg Hx      Social History   Socioeconomic History   Marital status: Married    Spouse name: Frenchie    Number of children: 2   Years of education: 20   Highest education level: Professional school degree (e.g., MD, DDS, DVM, JD)   Occupational History   Occupation: retired    Associate Professor: [employer]  Tobacco Use   Smoking status: Never   Smokeless tobacco: Never  Vaping Use   Vaping status: Never Used  Substance and Sexual Activity   Alcohol use: Never   Drug use: Never   Sexual activity: Not Currently    Birth control/protection: Abstinence  Other Topics Concern   Not on file  Social History Narrative   Lives alone. He has two children. He enjoys reading and house stuff.   Social Drivers of Corporate investment banker Strain: Low Risk  (12/04/2022)   Overall Financial Resource Strain (CARDIA)    Difficulty of Paying Living Expenses: Not very hard  Food Insecurity: No Food Insecurity (12/04/2022)   Hunger Vital Sign    Worried About Running Out of Food in the Last Year: Never true    Ran Out of Food in the Last Year: Never true  Transportation Needs: No Transportation Needs (12/04/2022)   PRAPARE - Administrator, Civil Service (Medical): No    Lack of Transportation (Non-Medical): No  Physical Activity: Sufficiently Active (12/04/2022)   Exercise Vital Sign    Days of Exercise per Week: 5 days    Minutes of Exercise per Session: 30 min  Stress: No Stress Concern Present (12/04/2022)   [employer] of Occupational Health - Occupational Stress Questionnaire    Feeling of Stress : Not at all  Social Connections: Moderately Isolated (12/06/2022)   Social Connection and Isolation Panel [NHANES]    Frequency of Communication with Friends and Family: More than three times a week    Frequency of Social Gatherings with Friends and Family: Three times a week    Attends Religious Services: Never    Active Member of Clubs or Organizations: Yes    Attends Banker Meetings: More than 4 times per year    Marital Status: Separated  Intimate Partner Violence: Not At Risk (12/06/2022)   Humiliation, Afraid, Rape, and Kick questionnaire    Fear of Current or Ex-Partner: No    Emotionally  Abused: No    Physically Abused: No    Sexually Abused: No    He has a history of smoking, having quit 30-35 years ago after smoking for about 10 years, but denies significant tobacco exposure since then. 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  Allergies  Allergen Reactions   Lisinopril Anaphylaxis and Other (See Comments)    angioedema   Carvedilol Other (See Comments)    wheezing   Ezetimibe-Simvastatin Other (See Comments)    Serious vertigo   Propranolol Hcl Other (See Comments)    wheezing   Ramipril Other (See Comments)    wheezing   Codeine Nausea Only     Outpatient Medications Prior to Visit  Medication Sig Dispense Refill   albuterol (VENTOLIN HFA) 108 (90 Base) MCG/ACT inhaler Inhale 2 puffs into the lungs every 6 (six) hours as needed for wheezing or shortness of breath. 18 g 1  antiseptic oral rinse (BIOTENE) LIQD 5 mLs by Mouth Rinse route as needed for dry mouth or mouth pain.     aspirin 81 MG EC tablet Take 81 mg by mouth at bedtime.     atorvastatin (LIPITOR) 40 MG tablet Take 1 tablet (40 mg total) by mouth daily. 90 tablet 0   fluticasone (FLONASE) 50 MCG/ACT nasal spray Place 1 spray into both nostrils daily as needed (congestion). 16 g 0   Fluticasone-Umeclidin-Vilant (TRELEGY ELLIPTA) 100-62.5-25 MCG/ACT AEPB Inhale 1 puff into the lungs daily. 60 each 3   hydrALAZINE (APRESOLINE) 50 MG tablet Take 1 tablet (50 mg total) by mouth 3 (three) times daily. 270 tablet 3   isosorbide mononitrate (IMDUR) 60 MG 24 hr tablet Take 1 tablet by mouth once daily 90 tablet 2   metoprolol succinate (TOPROL-XL) 100 MG 24 hr tablet TAKE 1 TABLET BY MOUTH ONCE DAILY AT BEDTIME WITH 50 MG TABLET FOR A TOTAL OF 150 MG 90 tablet 2   Multiple Vitamin (MULTIVITAMIN WITH MINERALS) TABS tablet Take 1 tablet by mouth at bedtime.     PARoxetine (PAXIL) 30 MG tablet Take 1 tablet by mouth once daily 90 tablet 0    triamcinolone cream (KENALOG) 0.1 % Apply 1 Application topically 2 (two) times daily. 30 g 2   UNABLE TO FIND Med Name: BP safe medication, otc, mult-symptom for cough/congestion.     warfarin (COUMADIN) 10 MG tablet TAKE 1 TABLET BY MOUTH ONCE DAILY AS DIRECTED BY COUMADIN CLINIC. 100 tablet 0   No facility-administered medications prior to visit.        Objective:   Physical Exam  Vitals:   05/09/23 1404 05/09/23 1406  BP: (!) 148/76 (!) 146/80  Pulse: 74   SpO2: 97%   Weight: 181 lb 6.4 oz (82.3 kg)   Height: 6\' 4"  (1.93 m)    Gen: Pleasant, tall thin man, in no distress,  normal affect  ENT: No lesions,  mouth clear,  oropharynx clear, no postnasal drip  Neck: No JVD, no stridor  Lungs: No use of accessory muscles, bilateral late inspiratory and expiratory wheezes.  No crackles.  Cardiovascular: RRR, heart sounds normal, no murmur or gallops, no peripheral edema  Musculoskeletal: No deformities, no cyanosis or clubbing  Neuro: alert, awake, non focal  Skin: Warm, no lesions or rash      Assessment & Plan:   Asthma Asthma Longstanding history, currently on Trelegy and Albuterol as needed. Reports improvement with Trelegy. Noted wheezing on exam and morning congestion. Possible contribution from environmental factors (books, dust). -Continue Trelegy and Albuterol as needed. -Order pulmonary function testing to assess current airflow and lung capacity. -Consider impact of reflux on asthma control.   Pulmonary nodules Groundglass pulmonary nodules of unclear significance, stable since 2023.  Consider inflammatory process, also must consider possible adenocarcinoma although certainly less likely.  Need to be followed for at least 5 years.  He gets his surveillance CT scans in July-August for his aorta.  We will follow-up with him after that scan to evaluate the nodules and discuss.  GERD (gastroesophageal reflux disease) Gastroesophageal Reflux Disease  (GERD) Reports heartburn and chest discomfort, currently managing with dietary modifications and occasional baking soda. -Discuss potential benefits of medical management for GERD in relation to asthma control at follow-up.   Levy Pupa, MD, PhD 05/09/2023, 2:45 PM [facility] [phone] or if no answer before 7:00PM call [phone] For any issues after 7:00PM please call eLink [phone]

## 2023-05-09 NOTE — Patient Instructions (Signed)
 VISIT SUMMARY:  YOUR PLAN:  -ASTHMA: Asthma is a condition where your airways narrow and swell, producing extra mucus, which can make breathing difficult. You will continue using Trelegy and Albuterol as needed. We will also order pulmonary function testing to assess your current lung capacity and airflow. Additionally, we will consider how your reflux might be affecting your asthma control.  -GASTROESOPHAGEAL REFLUX DISEASE (GERD): GERD is a digestive disorder where stomach acid irritates the food pipe lining. You are currently managing it with dietary changes and occasional baking soda. We will discuss the potential benefits of medical management for GERD at your next follow-up, especially in relation to your asthma control.  -PULMONARY NODULES: Pulmonary nodules are small growths in the lungs that are usually benign. Your recent CT scan showed stable nodules. We will continue with annual surveillance CT scans, with the next one due in August 2025, and review the results together at your follow-up.  -NONISCHEMIC CARDIOMYOPATHY: Nonischemic cardiomyopathy is a condition where the heart muscle is weakened and cannot pump blood efficiently. Your recent echocardiogram showed decreased left ventricular function. You will continue with your current cardiac management under the guidance of your cardiologist.  -AORTIC ANEURYSM: An aortic aneurysm is an abnormal bulge in the wall of the aorta. You are undergoing regular surveillance with annual CT scans, and we will continue with this plan.  INSTRUCTIONS:  Please follow up to review the results of your pulmonary function testing and your next CT scan. We will also discuss the potential for participation in clinical trials depending on the results of your testing.

## 2023-05-17 ENCOUNTER — Ambulatory Visit: Payer: Medicare Other | Attending: Cardiology

## 2023-05-17 DIAGNOSIS — Z7901 Long term (current) use of anticoagulants: Secondary | ICD-10-CM | POA: Diagnosis not present

## 2023-05-17 DIAGNOSIS — Z9889 Other specified postprocedural states: Secondary | ICD-10-CM | POA: Insufficient documentation

## 2023-05-17 LAB — POCT INR: INR: 4.6 — AB (ref 2.0–3.0)

## 2023-05-17 NOTE — Patient Instructions (Signed)
 Description   Hold today's dose and then resume taking Warfarin 1 tablet daily except 1/2 on Sundays.  Stay consistent with greens each week (3 times per week).   Repeat INR in 2 weeks.  Coumadin Clinic [phone]

## 2023-06-01 ENCOUNTER — Ambulatory Visit: Attending: Cardiology | Admitting: *Deleted

## 2023-06-01 DIAGNOSIS — Z7901 Long term (current) use of anticoagulants: Secondary | ICD-10-CM | POA: Diagnosis not present

## 2023-06-01 DIAGNOSIS — Z9889 Other specified postprocedural states: Secondary | ICD-10-CM | POA: Diagnosis not present

## 2023-06-01 LAB — POCT INR: INR: 3.4 — AB (ref 2.0–3.0)

## 2023-06-01 NOTE — Patient Instructions (Addendum)
 Description   Continue taking Warfarin 1 tablet daily except 1/2 tablet on Sundays.  Stay consistent with greens each week (3 times per week).   Repeat INR in 3 weeks.  Coumadin Clinic [phone]

## 2023-06-21 ENCOUNTER — Ambulatory Visit (INDEPENDENT_AMBULATORY_CARE_PROVIDER_SITE_OTHER): Admitting: *Deleted

## 2023-06-21 ENCOUNTER — Ambulatory Visit: Payer: Medicare Other | Attending: Physician Assistant | Admitting: Physician Assistant

## 2023-06-21 VITALS — BP 120/74 | HR 92 | Ht 76.0 in | Wt 185.0 lb

## 2023-06-21 DIAGNOSIS — E785 Hyperlipidemia, unspecified: Secondary | ICD-10-CM | POA: Insufficient documentation

## 2023-06-21 DIAGNOSIS — I428 Other cardiomyopathies: Secondary | ICD-10-CM | POA: Diagnosis not present

## 2023-06-21 DIAGNOSIS — I2510 Atherosclerotic heart disease of native coronary artery without angina pectoris: Secondary | ICD-10-CM | POA: Insufficient documentation

## 2023-06-21 DIAGNOSIS — Z9889 Other specified postprocedural states: Secondary | ICD-10-CM

## 2023-06-21 DIAGNOSIS — Z5181 Encounter for therapeutic drug level monitoring: Secondary | ICD-10-CM

## 2023-06-21 DIAGNOSIS — Z7901 Long term (current) use of anticoagulants: Secondary | ICD-10-CM | POA: Diagnosis not present

## 2023-06-21 DIAGNOSIS — I10 Essential (primary) hypertension: Secondary | ICD-10-CM | POA: Insufficient documentation

## 2023-06-21 DIAGNOSIS — R0609 Other forms of dyspnea: Secondary | ICD-10-CM | POA: Diagnosis not present

## 2023-06-21 DIAGNOSIS — Z952 Presence of prosthetic heart valve: Secondary | ICD-10-CM | POA: Insufficient documentation

## 2023-06-21 LAB — POCT INR: POC INR: 1.8

## 2023-06-21 NOTE — Progress Notes (Signed)
 Cardiology Office Note:  .   Date:  06/23/2023  ID:  Corey Mathis, DOB [birth year], MRN [id number] PCP: Cydney Draft, MD  [facility] Providers Cardiologist:  Alexandria Angel, MD     History of Present Illness: .   Corey Mathis is a 73 y.o. male with past medical history of mechanical mitral valve on Coumadin , CAD, nonischemic cardiomyopathy, aortic root dilatation, hypertension and hyperlipidemia.  Patient underwent mitral valve replacement with [facility] Jude mechanical valve in 1998.  He has been taking Coumadin .  Abdominal ultrasound in November 2015 showed no aneurysm.  Echocardiogram in December 2018 showed EF 35 to 40% with diffuse hypokinesis, dilated ascending aorta measuring at 43 mm, stable mechanical mitral valve.  Cardiac catheterization in May 2019 showed moderate nonobstructive CAD with 50% lesion in proximal to mid LAD, 20% proximal and mid RCA lesion, EF greater than 65%.  Echocardiogram in June 2022 showed EF 45 to 50%, mild LVH, moderate biatrial enlargement, stable mitral valve replacement, mildly dilated aortic root measuring 40 mm.  CTA August 2022 showed 4.1 cm ascending aortic aneurysm.  CT of the chest aorta obtained in July 2024 showed stable dilated aorta measuring 4.1 cm.  There was also evidence of thickened bronchial walls in the groundglass opacity bilaterally, he was referred to pulmonology service.  Echocardiogram obtained on 12/05/2022 showed EF 45 to 50%, global hypokinesis, mild LVH, mildly reduced RV systolic function, mechanical mitral valve was stable, mild dilatation of the ascending aorta measuring 43 mm.  He was last seen in January by Palmer Bobo, NP for evaluation of dyspnea on exertion.  Patient has a history of asthma and chronic bronchitis and felt symptoms may have worsened and I would like a trial of decrease metoprolol  dosage.  Metoprolol  succinate was decreased to 100 mg daily from the previous 150 mg.   Patient presents today for  follow-up, since cutting back on beta-blocker, he says his breathing maybe slightly better.  He still feel congested in his chest.  On physical exam, he has mild wheezing in the lower base of the lungs.  He has upcoming PFT and follow-up with his pulmonologist of Dr. Baldwin Levee.  At this time, there is higher suspicion that his shortness of breath is more pulmonary rather than cardiac.  I recommended continue observation.  If PFT is normal, we may consider stress testing in the future, however patient does not have any clear anginal symptom such as chest pain.  He can follow-up with Dr. Audery Blazing in 3 to 4 months, earlier if his symptom worsens.  ROS:   He has shortness of breath with exertion, he denies any chest pain.  Studies Reviewed: Aaron Aas   EKG Interpretation Date/Time:  Thursday June 21 2023 13:38:47 EDT Ventricular Rate:  91 PR Interval:  204 QRS Duration:  106 QT Interval:  398 QTC Calculation: 489 R Axis:   28  Text Interpretation: Sinus rhythm with occasional Premature ventricular complexes No significant ST-T wave changes Confirmed by Ervin Heath [phone]) on 06/23/2023 9:23:39 PM    Cardiac Studies & Procedures   ______________________________________________________________________________________________ CARDIAC CATHETERIZATION  CARDIAC CATHETERIZATION 07/12/2017  Conclusion  Prox RCA lesion is 20% stenosed.  Mid RCA lesion is 20% stenosed.  Prox LAD to Mid LAD lesion is 50% stenosed.  The left ventricular systolic function is normal.  LV end diastolic pressure is normal.  The left ventricular ejection fraction is greater than 65% by visual estimate.  There is trivial (1+) mitral regurgitation.  1. The LAD  is a large aneurysmal vessel that courses to the apex. The mid vessel has a smooth 50% stenosis. The vessel prior to the lesion and distal to the lesion is very large. The stenosis is not flow limiting as there is a large luminal area. 2. The Circumflex is a large vessel  with a large obtuse marginal branch and a large intermediate branch. No obstructive disease in this system 3. The RCA is a very large dominant vessel with mild non-obstructive plaque. 4. LV systolic function is normal with LVEF estimated at 60-65%. 5. The mitral valve leaflets are opening well. Trivial MR. 6. Normal right and left heart filling pressures  Recommendations: Medical management of non-obstructive CAD.  Findings Coronary Findings Diagnostic  Dominance: Right  Left Anterior Descending Vessel is large. Prox LAD to Mid LAD lesion is 50% stenosed.  Left Circumflex Vessel is large.  Right Coronary Artery Vessel is large. Prox RCA lesion is 20% stenosed. Mid RCA lesion is 20% stenosed.  Intervention  No interventions have been documented.   STRESS TESTS  MYOCARDIAL PERFUSION IMAGING 07/27/2016  Narrative  The left ventricular ejection fraction is moderately decreased (30-44%).  Nuclear stress EF: 38%.  There was no ST segment deviation noted during stress.  Defect 1: There is a medium defect of moderate severity present in the basal inferior and mid inferior location.  This is an intermediate risk study.  Intermediate risk stress nuclear study with inferior thinning vs infarct; no ischemia; EF 38 with global hypokinesis and mild LVE; study interpreted as intermediate risk due to reduced LV function; findings most c/w NICM.   ECHOCARDIOGRAM  ECHOCARDIOGRAM COMPLETE 12/05/2022  Narrative ECHOCARDIOGRAM REPORT    Patient Name:   Corey Mathis Date of Exam: 12/05/2022 Medical Rec #:  [id number]       Height:       76.0 in Accession #:    [id number]      Weight:       194.2 lb Date of Birth:  [birth year]       BSA:          2.188 m Patient Age:    72 years        BP:           142/84 mmHg Patient Gender: M               HR:           63 bpm. Exam Location:  [address]  Procedure: 2D Echo, Cardiac Doppler and Color Doppler  Indications:    Z95.2  MVR  History:        Patient has prior history of Echocardiogram examinations, most recent 08/11/2020. Non ischemic cardiomyopathy, CAD, S/p MVR (mechanical); Risk Factors:Hypertension, Dyslipidemia and Dilated aortic root.  Sonographer:    Lula Sale RDCS Referring Phys: [phone] EMILY C MONGE   Sonographer Comments: Technically difficult study due to poor echo windows. IMPRESSIONS   1. Left ventricular ejection fraction, by estimation, is 45 to 50%. The left ventricle has mildly decreased function. The left ventricle demonstrates global hypokinesis. There is mild concentric left ventricular hypertrophy. Left ventricular diastolic parameters are indeterminate. 2. Right ventricular systolic function is mildly reduced. The right ventricular size is normal. Tricuspid regurgitation signal is inadequate for assessing PA pressure. 3. Left atrial size was mildly dilated. 4. Mechanical mitral valve. Mean gradient 3 mmHg (within normal limits), no significant peri-valvular regurgitation. 5. The aortic valve is tricuspid. There is mild calcification of the aortic valve. Aortic  valve regurgitation is not visualized. No aortic stenosis is present. 6. Aortic dilatation noted. There is mild dilatation of the ascending aorta, measuring 43 mm. 7. The inferior vena cava is normal in size with greater than 50% respiratory variability, suggesting right atrial pressure of 3 mmHg.  FINDINGS Left Ventricle: Left ventricular ejection fraction, by estimation, is 45 to 50%. The left ventricle has mildly decreased function. The left ventricle demonstrates global hypokinesis. The left ventricular internal cavity size was normal in size. There is mild concentric left ventricular hypertrophy. Left ventricular diastolic parameters are indeterminate.  Right Ventricle: The right ventricular size is normal. No increase in right ventricular wall thickness. Right ventricular systolic function is mildly reduced. Tricuspid  regurgitation signal is inadequate for assessing PA pressure.  Left Atrium: Left atrial size was mildly dilated.  Right Atrium: Right atrial size was normal in size.  Pericardium: There is no evidence of pericardial effusion.  Mitral Valve: Mechanical mitral valve. Mean gradient 3 mmHg (within normal limits), no significant peri-valvular regurgitation. The mitral valve has been repaired/replaced. No evidence of mitral valve regurgitation. MV peak gradient, 7.6 mmHg. The mean mitral valve gradient is 3.0 mmHg.  Tricuspid Valve: The tricuspid valve is normal in structure. Tricuspid valve regurgitation is not demonstrated.  Aortic Valve: The aortic valve is tricuspid. There is mild calcification of the aortic valve. Aortic valve regurgitation is not visualized. No aortic stenosis is present.  Pulmonic Valve: The pulmonic valve was normal in structure. Pulmonic valve regurgitation is not visualized.  Aorta: Aortic dilatation noted. There is mild dilatation of the ascending aorta, measuring 43 mm.  Venous: The inferior vena cava is normal in size with greater than 50% respiratory variability, suggesting right atrial pressure of 3 mmHg.  IAS/Shunts: No atrial level shunt detected by color flow Doppler.   LEFT VENTRICLE PLAX 2D LVIDd:         5.10 cm   Diastology LVIDs:         3.90 cm   LV e' medial:    5.00 cm/s LV PW:         1.30 cm   LV E/e' medial:  19.7 LV IVS:        1.50 cm   LV e' lateral:   8.81 cm/s LVOT diam:     2.60 cm   LV E/e' lateral: 11.2 LV SV:         84 LV SV Index:   38 LVOT Area:     5.31 cm   RIGHT VENTRICLE            IVC RV S prime:     8.70 cm/s  IVC diam: 1.40 cm TAPSE (M-mode): 1.6 cm  LEFT ATRIUM           Index        RIGHT ATRIUM           Index LA diam:      4.10 cm 1.87 cm/m   RA Pressure: 3.00 mmHg LA Vol (A4C): 70.2 ml 32.09 ml/m  RA Area:     17.00 cm RA Volume:   58.00 ml  26.51 ml/m AORTIC VALVE LVOT Vmax:   76.60 cm/s LVOT Vmean:   53.100 cm/s LVOT VTI:    0.158 m  AORTA Ao Root diam: 4.30 cm Ao Asc diam:  4.10 cm  MITRAL VALVE                TRICUSPID VALVE MV Area (PHT): 2.51 cm  Estimated RAP:  3.00 mmHg MV Area VTI:   2.18 cm MV Peak grad:  7.6 mmHg     SHUNTS MV Mean grad:  3.0 mmHg     Systemic VTI:  0.16 m MV Vmax:       1.38 m/s     Systemic Diam: 2.60 cm MV Vmean:      78.6 cm/s MV Decel Time: 302 msec MV E velocity: 98.50 cm/s MV A velocity: 138.00 cm/s MV E/A ratio:  0.71  Dalton McleanMD Electronically signed by Archer Bear Signature Date/Time: 12/05/2022/4:32:13 PM    Final          ______________________________________________________________________________________________      Risk Assessment/Calculations:          Physical Exam:   VS:  BP 120/74 (BP Location: Right Arm, Patient Position: Sitting, Cuff Size: Normal)   Pulse 92   Ht 6\' 4"  (1.93 m)   Wt 185 lb (83.9 kg)   SpO2 96%   BMI 22.52 kg/m    Wt Readings from Last 3 Encounters:  06/21/23 185 lb (83.9 kg)  05/09/23 181 lb 6.4 oz (82.3 kg)  03/28/23 189 lb 3.2 oz (85.8 kg)    GEN: Well nourished, well developed in no acute distress NECK: No JVD; No carotid bruits CARDIAC: RRR, no murmurs, rubs, gallops RESPIRATORY:  Clear to auscultation without rales, wheezing or rhonchi  ABDOMEN: Soft, non-tender, non-distended EXTREMITIES:  No edema; No deformity   ASSESSMENT AND PLAN: .    Dyspnea on exertion Persistent exertional dyspnea with wheezing and congestion. Suspected pulmonary origin over cardiac. - Per patient, he has a history of asthma and chronic bronchitis. - Perform pulmonary function test on May 7. - Follow up with pulmonologist Dr. Sidney Drain post-test. - Consider stress test if pulmonary workup negative.  Nonischemic cardiomyopathy: Last echocardiogram in October 2024 showed EF 45 to 50%.  Unchanged when compared to previous echo from 2022  Coronary Artery Disease (CAD) Moderate  non-obstructive CAD with lesions in LAD and RCA. No clear angina. Monitor due to increased heart rate post-metoprolol  reduction. - Continue metoprolol , hydralazine , Imdur . - Monitor heart rate and symptoms. - Consider stress test if pulmonary workup negative.  Mechanical Mitral Valve Replacement St. Jude mechanical valve stable. Continued anticoagulation required. - Continue Coumadin  therapy. - Monitor INR levels regularly.  Hypertension: Blood pressure stable  Hyperlipidemia: On atorvastatin        Dispo: Follow-up with Dr. Audery Blazing in 3 to 4 months  Signed, Kaylor Simenson, [place]

## 2023-06-21 NOTE — Patient Instructions (Addendum)
 Description   Take 1.5 tablets of warfarin today and tomorrow Continue taking Warfarin 1 tablet daily except 1/2 tablet on Sundays.  Stay consistent with greens each week (3 times per week).   Repeat INR in 2 weeks.  Coumadin Clinic [phone]      1st Floor: - Lobby - Registration  - Pharmacy  - Lab - Cafe  2nd Floor: - PV Lab - Diagnostic Testing (echo, CT, nuclear med)  3rd Floor: - Vacant  4th Floor: - TCTS (cardiothoracic surgery) - AFib Clinic - Structural Heart Clinic - Vascular Surgery  - Vascular Ultrasound  5th Floor: - HeartCare Cardiology (general and EP) - Clinical Pharmacy for coumadin, hypertension, lipid, weight-loss medications, and med management appointments    Valet parking services will be available as well.

## 2023-06-21 NOTE — Patient Instructions (Signed)
 Medication Instructions:  NO CHANGES *If you need a refill on your cardiac medications before your next appointment, please call your pharmacy*  Lab Work: NO LABS If you have labs (blood work) drawn today and your tests are completely normal, you will receive your results only by: MyChart Message (if you have MyChart) OR A paper copy in the mail If you have any lab test that is abnormal or we need to change your treatment, we will call you to review the results.  Testing/Procedures: NO TESTING  Follow-Up: At [facility], you and your health needs are our priority.  As part of our continuing mission to provide you with exceptional heart care, our providers are all part of one team.  This team includes your primary Cardiologist (physician) and Advanced Practice Providers or APPs (Physician Assistants and Nurse Practitioners) who all work together to provide you with the care you need, when you need it.  Your next appointment:   3-4 month(s)  Provider:   Olga Millers, MD  Other Instructions   1st Floor: - Lobby - Registration  - Pharmacy  - Lab - Cafe  2nd Floor: - PV Lab - Diagnostic Testing (echo, CT, nuclear med)  3rd Floor: - Vacant  4th Floor: - TCTS (cardiothoracic surgery) - AFib Clinic - Structural Heart Clinic - Vascular Surgery  - Vascular Ultrasound  5th Floor: - HeartCare Cardiology (general and EP) - Clinical Pharmacy for coumadin, hypertension, lipid, weight-loss medications, and med management appointments    Valet parking services will be available as well.

## 2023-07-04 ENCOUNTER — Other Ambulatory Visit: Payer: Self-pay | Admitting: Family Medicine

## 2023-07-06 ENCOUNTER — Encounter

## 2023-07-06 ENCOUNTER — Ambulatory Visit: Attending: Cardiology

## 2023-07-06 DIAGNOSIS — Z9889 Other specified postprocedural states: Secondary | ICD-10-CM | POA: Insufficient documentation

## 2023-07-06 DIAGNOSIS — Z7901 Long term (current) use of anticoagulants: Secondary | ICD-10-CM | POA: Insufficient documentation

## 2023-07-06 LAB — POCT INR: INR: 1.6 — AB (ref 2.0–3.0)

## 2023-07-06 NOTE — Patient Instructions (Signed)
 Take 2 tablets of warfarin today and 1.5 tablets tomorrow then  Continue taking Warfarin 1 tablet daily except 1/2 tablet on Sundays.  Stay consistent with greens each week (3 times per week).   Repeat INR in 2 weeks.  Coumadin  Clinic [phone]

## 2023-07-11 ENCOUNTER — Ambulatory Visit (INDEPENDENT_AMBULATORY_CARE_PROVIDER_SITE_OTHER): Admitting: Emergency Medicine

## 2023-07-11 ENCOUNTER — Encounter

## 2023-07-11 DIAGNOSIS — J4530 Mild persistent asthma, uncomplicated: Secondary | ICD-10-CM | POA: Diagnosis not present

## 2023-07-11 LAB — PULMONARY FUNCTION TEST
DL/VA % pred: 114 %
DL/VA: 4.43 ml/min/mmHg/L
DLCO cor % pred: 66 %
DLCO cor: 19.51 ml/min/mmHg
DLCO unc % pred: 66 %
DLCO unc: 19.51 ml/min/mmHg
FEF 25-75 Post: 0.5 L/s
FEF 25-75 Pre: 0.56 L/s
FEF2575-%Change-Post: -10 %
FEF2575-%Pred-Post: 18 %
FEF2575-%Pred-Pre: 20 %
FEV1-%Change-Post: 0 %
FEV1-%Pred-Post: 34 %
FEV1-%Pred-Pre: 34 %
FEV1-Post: 1.28 L
FEV1-Pre: 1.28 L
FEV1FVC-%Change-Post: 1 %
FEV1FVC-%Pred-Pre: 72 %
FEV6-%Change-Post: 0 %
FEV6-%Pred-Post: 49 %
FEV6-%Pred-Pre: 50 %
FEV6-Post: 2.41 L
FEV6-Pre: 2.42 L
FEV6FVC-%Change-Post: 0 %
FEV6FVC-%Pred-Post: 105 %
FEV6FVC-%Pred-Pre: 105 %
FVC-%Change-Post: 0 %
FVC-%Pred-Post: 47 %
FVC-%Pred-Pre: 47 %
FVC-Post: 2.41 L
FVC-Pre: 2.43 L
Post FEV1/FVC ratio: 53 %
Post FEV6/FVC ratio: 100 %
Pre FEV1/FVC ratio: 52 %
Pre FEV6/FVC Ratio: 99 %
RV % pred: 227 %
RV: 6.43 L
TLC % pred: 120 %
TLC: 9.73 L

## 2023-07-11 NOTE — Patient Instructions (Signed)
 Full PFT performed today.

## 2023-07-11 NOTE — Progress Notes (Signed)
 Full PFT performed today.

## 2023-07-12 ENCOUNTER — Ambulatory Visit (INDEPENDENT_AMBULATORY_CARE_PROVIDER_SITE_OTHER): Admitting: Emergency Medicine

## 2023-07-12 ENCOUNTER — Encounter: Payer: Self-pay | Admitting: Emergency Medicine

## 2023-07-12 VITALS — BP 138/83 | HR 63 | Ht 76.0 in | Wt 184.2 lb

## 2023-07-12 DIAGNOSIS — Z87891 Personal history of nicotine dependence: Secondary | ICD-10-CM

## 2023-07-12 DIAGNOSIS — R918 Other nonspecific abnormal finding of lung field: Secondary | ICD-10-CM | POA: Diagnosis not present

## 2023-07-12 DIAGNOSIS — J301 Allergic rhinitis due to pollen: Secondary | ICD-10-CM

## 2023-07-12 DIAGNOSIS — J309 Allergic rhinitis, unspecified: Secondary | ICD-10-CM | POA: Insufficient documentation

## 2023-07-12 DIAGNOSIS — J4530 Mild persistent asthma, uncomplicated: Secondary | ICD-10-CM

## 2023-07-12 DIAGNOSIS — J45909 Unspecified asthma, uncomplicated: Secondary | ICD-10-CM

## 2023-07-12 NOTE — Assessment & Plan Note (Signed)
 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  Pattern is consistent with fixed obstruction.  He benefits from Trelegy, plan to continue.  Consider walking oximetry at some point going forward if his functional capacity changes.

## 2023-07-12 NOTE — Patient Instructions (Signed)
 We reviewed your pulmonary function testing today. Continue Trelegy 1 inhalation once daily.  Rinse and gargle after using. Keep your albuterol  available to use 2 puffs when needed for shortness of breath, chest tightness, wheezing. Continue your Flonase  as you have been taking it Agree with adding Zyrtec once daily at least during the spring allergy season Get your CT scan of the chest, abdomen, pelvis to evaluate your aorta as planned this summer. We will follow-up in September to review your CT chest and your pulmonary nodules for stability. Please call sooner if you have any problems.

## 2023-07-12 NOTE — Progress Notes (Signed)
 Subjective:    Patient ID: Corey Mathis, male    DOB: 03/21/1950, 73 y.o.   MRN: [id number]  HPI  ROV 07/12/2023 --73 year old former smoker with a history of longstanding asthma whom I saw in March.  He also has groundglass pulmonary nodules of unclear significance, have been stable going back to 2023.  He gets imaging for his aorta as well, so CT scans of the chest are synchronized with those studies.  Currently maintained on Trelegy.  He is using albuterol  approximately 1-2x a day.  Underwent pulmonary function testing on 07/11/2023 as below. He has SOB w walking uphill, carrying things. Coughs some in the am to clear mucous, no purulence. Rare wheeze. No flares for many years. He is on flonase  for nasal congestion and drainage  Pulmonary function testing 07/11/2023 reviewed by me, shows severe obstruction (FEV1 1.28 L, 34% predicted) without a bronchodilator response.  Lung volumes are hyperinflated.  The diffusion capacity is decreased and corrects to the normal range when adjusted for alveolar volume.    Review of Systems As per HPI  Past Medical History:  Diagnosis Date   Allergy    Anxiety    Asthma    CHF (congestive heart failure) (HCC)    Clotting disorder (HCC) 1998   Warfarin   COPD (chronic obstructive pulmonary disease) (HCC) 2024   Listed   Dyslipidemia    GERD (gastroesophageal reflux disease) 05/09/2023   Heart murmur 1980   Listed   History of mitral valve replacement    Hypertension    Labyrinthitis    hx     Family History  Problem Relation Age of Onset   Heart attack Father 76   Heart disease Father    Colon cancer Mother 52   Hypertension Mother    Arthritis Mother    Cancer Mother    Breast cancer Maternal Aunt    Stomach cancer Neg Hx    Esophageal cancer Neg Hx    Inflammatory bowel disease Neg Hx    Liver disease Neg Hx    Pancreatic cancer Neg Hx      Social History   Socioeconomic History   Marital status: Married    Spouse name:  Frenchie    Number of children: 2   Years of education: 20   Highest education level: Professional school degree (e.g., MD, DDS, DVM, JD)  Occupational History   Occupation: retired    Associate Professor: [employer]  Tobacco Use   Smoking status: Never   Smokeless tobacco: Never  Vaping Use   Vaping status: Never Used  Substance and Sexual Activity   Alcohol use: Never   Drug use: Never   Sexual activity: Not Currently    Birth control/protection: Abstinence  Other Topics Concern   Not on file  Social History Narrative   Lives alone. He has two children. He enjoys reading and house stuff.   Social Drivers of Corporate investment banker Strain: Low Risk  (12/04/2022)   Overall Financial Resource Strain (CARDIA)    Difficulty of Paying Living Expenses: Not very hard  Food Insecurity: No Food Insecurity (12/04/2022)   Hunger Vital Sign    Worried About Running Out of Food in the Last Year: Never true    Ran Out of Food in the Last Year: Never true  Transportation Needs: No Transportation Needs (12/04/2022)   PRAPARE - Administrator, Civil Service (Medical): No    Lack of Transportation (Non-Medical): No  Physical  Activity: Sufficiently Active (12/04/2022)   Exercise Vital Sign    Days of Exercise per Week: 5 days    Minutes of Exercise per Session: 30 min  Stress: No Stress Concern Present (12/04/2022)   [employer] of Occupational Health - Occupational Stress Questionnaire    Feeling of Stress : Not at all  Social Connections: Moderately Isolated (12/06/2022)   Social Connection and Isolation Panel [NHANES]    Frequency of Communication with Friends and Family: More than three times a week    Frequency of Social Gatherings with Friends and Family: Three times a week    Attends Religious Services: Never    Active Member of Clubs or Organizations: Yes    Attends Banker Meetings: More than 4 times per year    Marital Status: Separated  Intimate Partner  Violence: Not At Risk (12/06/2022)   Humiliation, Afraid, Rape, and Kick questionnaire    Fear of Current or Ex-Partner: No    Emotionally Abused: No    Physically Abused: No    Sexually Abused: No    He has a history of smoking, having quit 30-35 years ago after smoking for about 10 years, but denies significant tobacco exposure since then. 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  Allergies  Allergen Reactions   Lisinopril Anaphylaxis and Other (See Comments)    angioedema   Carvedilol Other (See Comments)    wheezing   Ezetimibe-Simvastatin Other (See Comments)    Serious vertigo   Propranolol Hcl Other (See Comments)    wheezing   Ramipril Other (See Comments)    wheezing   Codeine Nausea Only     Outpatient Medications Prior to Visit  Medication Sig Dispense Refill   albuterol  (VENTOLIN  HFA) 108 (90 Base) MCG/ACT inhaler Inhale 2 puffs into the lungs every 6 (six) hours as needed for wheezing or shortness of breath. 18 g 1   antiseptic oral rinse (BIOTENE) LIQD 5 mLs by Mouth Rinse route as needed for dry mouth or mouth pain.     aspirin  81 MG EC tablet Take 81 mg by mouth at bedtime.     atorvastatin  (LIPITOR) 40 MG tablet Take 1 tablet (40 mg total) by mouth daily. 90 tablet 0   fluticasone  (FLONASE ) 50 MCG/ACT nasal spray Place 1 spray into both nostrils daily as needed (congestion). 16 g 0   hydrALAZINE  (APRESOLINE ) 50 MG tablet Take 1 tablet (50 mg total) by mouth 3 (three) times daily. 270 tablet 3   isosorbide  mononitrate (IMDUR ) 60 MG 24 hr tablet Take 1 tablet by mouth once daily 90 tablet 2   metoprolol  succinate (TOPROL -XL) 100 MG 24 hr tablet TAKE 1 TABLET BY MOUTH ONCE DAILY AT BEDTIME WITH 50 MG TABLET FOR A TOTAL OF 150 MG 90 tablet 2   Multiple Vitamin (MULTIVITAMIN WITH MINERALS) TABS tablet Take 1 tablet by mouth at bedtime.     PARoxetine  (PAXIL ) 30 MG tablet Take 1 tablet by mouth once daily 90  tablet 0   TRELEGY ELLIPTA  100-62.5-25 MCG/ACT AEPB Inhale 1 puff by mouth into the lungs daily. 60 each 2   triamcinolone  cream (KENALOG ) 0.1 % Apply 1 Application topically 2 (two) times daily. 30 g 2   UNABLE TO FIND Med Name: BP safe medication, otc, mult-symptom for cough/congestion.     warfarin (COUMADIN ) 10 MG tablet TAKE 1 TABLET BY MOUTH ONCE DAILY AS DIRECTED BY COUMADIN  CLINIC. 100 tablet 0  No facility-administered medications prior to visit.        Objective:   Physical Exam  Vitals:   07/12/23 0826 07/12/23 0827  BP: (!) 141/84 138/83  Pulse: 63   SpO2: 94%   Weight: 184 lb 3.2 oz (83.6 kg)   Height: 6\' 4"  (1.93 m)    Gen: Pleasant, tall thin man, in no distress,  normal affect  ENT: No lesions,  mouth clear,  oropharynx clear, no postnasal drip  Neck: No JVD, no stridor  Lungs: No use of accessory muscles, clear on a normal breath, he does have expiratory wheezing on a forced expiration  Cardiovascular: RRR, heart sounds normal, no murmur or gallops, no peripheral edema  Musculoskeletal: No deformities, no cyanosis or clubbing  Neuro: alert, awake, non focal  Skin: Warm, no lesions or rash      Assessment & Plan:   Asthma Severe obstruction on his PFT although he has relatively good functional capacity, no wheeze, no recent flares.  Pattern is consistent with fixed obstruction.  He benefits from Trelegy, plan to continue.  Consider walking oximetry at some point going forward if his functional capacity changes.  Pulmonary nodules Following serial imaging.  He gets CT angio to follow his aorta and we will review lung findings after.  Plan to follow-up in September  Allergic rhinitis Benefiting from Flonase  but still has breakthrough rhinitis.  We will plan to add Zyrtec at least through the allergy season.    Racheal Buddle, MD, PhD 07/12/2023, 8:46 AM [facility] [phone] or if no answer before 7:00PM call  [phone] For any issues after 7:00PM please call eLink [phone]

## 2023-07-12 NOTE — Assessment & Plan Note (Signed)
 Following serial imaging.  He gets CT angio to follow his aorta and we will review lung findings after.  Plan to follow-up in September

## 2023-07-12 NOTE — Assessment & Plan Note (Signed)
 Benefiting from Flonase  but still has breakthrough rhinitis.  We will plan to add Zyrtec at least through the allergy season.

## 2023-07-20 ENCOUNTER — Ambulatory Visit: Attending: Cardiology

## 2023-07-20 DIAGNOSIS — Z7901 Long term (current) use of anticoagulants: Secondary | ICD-10-CM | POA: Diagnosis not present

## 2023-07-20 DIAGNOSIS — Z9889 Other specified postprocedural states: Secondary | ICD-10-CM | POA: Diagnosis not present

## 2023-07-20 LAB — POCT INR: INR: 3.7 — AB (ref 2.0–3.0)

## 2023-07-20 NOTE — Patient Instructions (Signed)
 Description   Only take 1/2 tablet today and then continue taking Warfarin 1 tablet daily except 1/2 tablet on Sundays.  Stay consistent with greens each week (3 times per week).   Repeat INR in 3 weeks.  Coumadin  Clinic [phone]

## 2023-07-24 ENCOUNTER — Other Ambulatory Visit: Payer: Self-pay | Admitting: Family Medicine

## 2023-07-28 ENCOUNTER — Other Ambulatory Visit: Payer: Self-pay | Admitting: Cardiology

## 2023-07-28 ENCOUNTER — Other Ambulatory Visit: Payer: Self-pay | Admitting: Family Medicine

## 2023-07-28 DIAGNOSIS — Z9889 Other specified postprocedural states: Secondary | ICD-10-CM

## 2023-07-28 DIAGNOSIS — F411 Generalized anxiety disorder: Secondary | ICD-10-CM

## 2023-07-31 NOTE — Telephone Encounter (Signed)
 Refill request for warfarin:  Last INR was 3.7 on 07/20/23 Next INR due 08/10/23 LOV was 06/21/23  Refill approved.

## 2023-08-10 ENCOUNTER — Ambulatory Visit

## 2023-08-15 ENCOUNTER — Ambulatory Visit: Attending: Cardiology | Admitting: *Deleted

## 2023-08-15 DIAGNOSIS — Z9889 Other specified postprocedural states: Secondary | ICD-10-CM | POA: Diagnosis not present

## 2023-08-15 DIAGNOSIS — Z7901 Long term (current) use of anticoagulants: Secondary | ICD-10-CM | POA: Insufficient documentation

## 2023-08-15 LAB — POCT INR: INR: 2.5 (ref 2.0–3.0)

## 2023-08-15 NOTE — Patient Instructions (Signed)
 Description   Continue taking Warfarin 1 tablet daily except 1/2 tablet on Sundays.  Stay consistent with greens each week (3 times per week).   Repeat INR in 4 weeks.  Coumadin  Clinic [phone]

## 2023-09-05 ENCOUNTER — Other Ambulatory Visit: Payer: Self-pay | Admitting: *Deleted

## 2023-09-05 DIAGNOSIS — I77819 Aortic ectasia, unspecified site: Secondary | ICD-10-CM

## 2023-09-06 ENCOUNTER — Telehealth: Payer: Self-pay | Admitting: Family Medicine

## 2023-09-06 ENCOUNTER — Encounter: Payer: Self-pay | Admitting: Family Medicine

## 2023-09-06 ENCOUNTER — Ambulatory Visit (INDEPENDENT_AMBULATORY_CARE_PROVIDER_SITE_OTHER): Payer: Medicare Other | Admitting: Family Medicine

## 2023-09-06 VITALS — BP 116/82 | HR 77 | Ht 76.0 in | Wt 193.0 lb

## 2023-09-06 DIAGNOSIS — R7301 Impaired fasting glucose: Secondary | ICD-10-CM

## 2023-09-06 DIAGNOSIS — J42 Unspecified chronic bronchitis: Secondary | ICD-10-CM | POA: Diagnosis not present

## 2023-09-06 DIAGNOSIS — I10 Essential (primary) hypertension: Secondary | ICD-10-CM

## 2023-09-06 LAB — POCT GLYCOSYLATED HEMOGLOBIN (HGB A1C): Hemoglobin A1C: 5.6 % (ref 4.0–5.6)

## 2023-09-06 MED ORDER — METOPROLOL SUCCINATE ER 100 MG PO TB24: 100.0000 mg | ORAL_TABLET | Freq: Every day | ORAL | 3 refills | Status: AC

## 2023-09-06 NOTE — Assessment & Plan Note (Signed)
 Blood pressure looks fantastic.  Continue current regimen.  Currently on metoprolol  100 mg.  He used to be on 150.  Isosorbide  and hydralazine .

## 2023-09-06 NOTE — Assessment & Plan Note (Signed)
 A1c looks fantastic today at 5.6.  He is not currently on any medications.

## 2023-09-06 NOTE — Progress Notes (Signed)
   09/06/2023  Patient ID: Corey Mathis, male   DOB: 11/24/1950, 73 y.o.   MRN: [id number]  Clinic routed request from patient's PCP, Dr. Alvan, stating [name] is currently prescribed and taking Trelegy; but this medication is rather costly for him each month, and he would like a more affordable alternative.  I was not able to get plan alternatives when I called patient's insurance; but based on his online formulary the following alteratives would be more affordable:  Fluticasone /salmeterol (generic Advair)   It does not appear generic Spiriva is covered or generic Symbicort , and other brand name inhalers are same tier as Trelegy, so cost would not be cheaper more than likely.    Contacting patient to see if we can schedule a telephone visit to see if my qualify for PAP for Trelegy or Breztri.  Channing DELENA Mealing, PharmD, DPLA

## 2023-09-06 NOTE — Telephone Encounter (Signed)
 Can you check to see if there might be an alternative instead of the Trelegy that is more cost effective for him.  It has been costing him several $100.  I tried to pull in a couple different options but everything is coming up the same tier or not covered at all okay to switch him to a split combination and he is as well.

## 2023-09-06 NOTE — Assessment & Plan Note (Signed)
 His Trelegy is extremely expensive so I will work with our pharmacist to see if we might be able to find something cheaper for him even if we have to separate out the ingredients I think that would be reasonable.

## 2023-09-06 NOTE — Progress Notes (Signed)
 Established Patient Office Visit  Subjective  Patient ID: Corey Mathis, male    DOB: 1951/02/14  Age: 73 y.o. MRN: [id number]  Chief Complaint  Patient presents with   Hypertension   ifg    HPI  Hypertension- Pt denies chest pain, SOB, dizziness, or heart palpitations.  Taking meds as directed w/o problems.  Denies medication side effects.    Impaired fasting glucose-no increased thirst or urination. No symptoms consistent with hypoglycemia.  KNees occ get painful and swell but right now he feels like he is managing it and does not want to necessarily do anything different.  COPD-he says that as long as he does his Trelegy and his breathing exercises he usually does pretty good occasionally if he gets outside or gets around just to have a little bit more hard time breathing.  He did recently start taking Zyrtec 10 mg daily as recommended by the pulmonologist.  He says that his Trelegy has been very expensive.    ROS    Objective:     BP 116/82   Pulse 77   Ht 6' 4 (1.93 m)   Wt 193 lb 0.6 oz (87.6 kg)   SpO2 98%   BMI 23.50 kg/m    Physical Exam Vitals and nursing note reviewed.  Constitutional:      Appearance: Normal appearance.  HENT:     Head: Normocephalic and atraumatic.  Eyes:     Conjunctiva/sclera: Conjunctivae normal.  Cardiovascular:     Rate and Rhythm: Normal rate and regular rhythm.  Pulmonary:     Effort: Pulmonary effort is normal.     Breath sounds: Wheezing present.     Comments: Just some slight expiratory wheezing especially in the right upper lung. Skin:    General: Skin is warm and dry.  Neurological:     Mental Status: He is alert.  Psychiatric:        Mood and Affect: Mood normal.      Results for orders placed or performed in visit on 09/06/23  POCT HgB A1C  Result Value Ref Range   Hemoglobin A1C 5.6 4.0 - 5.6 %   HbA1c POC (<> result, manual entry)     HbA1c, POC (prediabetic range)     HbA1c, POC (controlled  diabetic range)        The 10-year ASCVD risk score (Arnett DK, et al., 2019) is: 18.6%    Assessment & Plan:   Problem List Items Addressed This Visit       Cardiovascular and Mediastinum   Essential hypertension   Blood pressure looks fantastic.  Continue current regimen.  Currently on metoprolol  100 mg.  He used to be on 150.  Isosorbide  and hydralazine .      Relevant Medications   metoprolol  succinate (TOPROL -XL) 100 MG 24 hr tablet   Other Relevant Orders   CMP14+EGFR     Respiratory   Chronic bronchitis (HCC)   His Trelegy is extremely expensive so I will work with our pharmacist to see if we might be able to find something cheaper for him even if we have to separate out the ingredients I think that would be reasonable.        Endocrine   IFG (impaired fasting glucose) - Primary   A1c looks fantastic today at 5.6.  He is not currently on any medications.      Relevant Orders   POCT HgB A1C (Completed)   CMP14+EGFR    No follow-ups on file.  Dorothyann Byars, MD

## 2023-09-06 NOTE — Patient Instructions (Signed)
 I am going to contact one of our pharmacy team experts to see what we can do about the inhaler and what might be cheaper on your plan.  I will let you know.

## 2023-09-07 LAB — CMP14+EGFR
ALT: 20 IU/L (ref 0–44)
AST: 35 IU/L (ref 0–40)
Albumin: 4.1 g/dL (ref 3.8–4.8)
Alkaline Phosphatase: 105 IU/L (ref 44–121)
BUN/Creatinine Ratio: 7 — ABNORMAL LOW (ref 10–24)
BUN: 7 mg/dL — ABNORMAL LOW (ref 8–27)
Bilirubin Total: 0.5 mg/dL (ref 0.0–1.2)
CO2: 21 mmol/L (ref 20–29)
Calcium: 8.9 mg/dL (ref 8.6–10.2)
Chloride: 102 mmol/L (ref 96–106)
Creatinine, Ser: 0.97 mg/dL (ref 0.76–1.27)
Globulin, Total: 2.9 g/dL (ref 1.5–4.5)
Glucose: 101 mg/dL — ABNORMAL HIGH (ref 70–99)
Potassium: 4.4 mmol/L (ref 3.5–5.2)
Sodium: 138 mmol/L (ref 134–144)
Total Protein: 7 g/dL (ref 6.0–8.5)
eGFR: 81 mL/min/1.73 (ref >59)

## 2023-09-08 ENCOUNTER — Ambulatory Visit: Payer: Self-pay | Admitting: Family Medicine

## 2023-09-08 NOTE — Progress Notes (Signed)
 Your lab work is within acceptable range and there are no concerning findings.   ?

## 2023-09-08 NOTE — Telephone Encounter (Signed)
 He has one.  He paid about $300 for one inhaler, yikes!!

## 2023-09-11 NOTE — Progress Notes (Signed)
 HPI: FU mitral valve replacement with St. Jude valve in 1998 and nonischemic cardiomyopathy. Abdominal ultrasound November 2015 showed no aneurysm. Echo December 2018 showed ejection fraction 35-40% with diffuse hypokinesis.  The ascending aorta measured 43 mm. There was a mechanical valve with mean gradient 3 mmHg. Mild biatrial enlargement. Cardiac catheterization May 2019 showed moderate nonobstructive coronary disease and ejection fraction greater than 65%. CTA 7/24 showed 4.1 cm ascending aortic aneurysm; left upper lobe and right middle lobe pulmonary nodules and follow-up recommended in 1 year.  Echocardiogram October 2024 showed ejection fraction 45 to 50%, mild left ventricular hypertrophy, mild RV dysfunction, mild left atrial enlargement, status post mitral valve replacement with mean gradient 3 mmHg and no mitral regurgitation, mildly dilated ascending aorta at 43 mm.  Since I last saw him, he denies dyspnea, chest pain, palpitations or syncope.  No bleeding.  Current Outpatient Medications  Medication Sig Dispense Refill   albuterol  (VENTOLIN  HFA) 108 (90 Base) MCG/ACT inhaler INHALE 2 PUFFS BY MOUTH EVERY 6 HOURS AS NEEDED FOR WHEEZING FOR SHORTNESS OF BREATH 18 g 0   antiseptic oral rinse (BIOTENE) LIQD 5 mLs by Mouth Rinse route as needed for dry mouth or mouth pain.     aspirin  81 MG EC tablet Take 81 mg by mouth at bedtime.     atorvastatin  (LIPITOR) 40 MG tablet Take 1 tablet (40 mg total) by mouth daily. 90 tablet 0   fluticasone  (FLONASE ) 50 MCG/ACT nasal spray Place 1 spray into both nostrils daily as needed (congestion). 16 g 0   hydrALAZINE  (APRESOLINE ) 50 MG tablet Take 1 tablet (50 mg total) by mouth 3 (three) times daily. 270 tablet 3   isosorbide  mononitrate (IMDUR ) 60 MG 24 hr tablet Take 1 tablet by mouth once daily 90 tablet 2   metoprolol  succinate (TOPROL -XL) 100 MG 24 hr tablet Take 1 tablet (100 mg total) by mouth daily. 90 tablet 3   Multiple Vitamin  (MULTIVITAMIN WITH MINERALS) TABS tablet Take 1 tablet by mouth at bedtime.     PARoxetine  (PAXIL ) 30 MG tablet Take 1 tablet by mouth once daily 90 tablet 0   TRELEGY ELLIPTA  100-62.5-25 MCG/ACT AEPB Inhale 1 puff by mouth into the lungs daily. 60 each 2   warfarin (COUMADIN ) 10 MG tablet TAKE 1 TABLET BY MOUTH ONCE DAILY AS DIRECTED BY COUMADIN  CLINIC. 100 tablet 1   No current facility-administered medications for this visit.     Past Medical History:  Diagnosis Date   Allergy    Anxiety    Asthma    CHF (congestive heart failure) (HCC)    Clotting disorder (HCC) 1998   Warfarin   COPD (chronic obstructive pulmonary disease) (HCC) 2024   Listed   Dyslipidemia    GERD (gastroesophageal reflux disease) 05/09/2023   Heart murmur 1980   Listed   History of mitral valve replacement    Hypertension    Labyrinthitis    hx    Past Surgical History:  Procedure Laterality Date   ACNE CYST REMOVAL     from hand and back   CARDIAC VALVE REPLACEMENT  1998   Listed   COLONOSCOPY WITH PROPOFOL  N/A 04/21/2021   Procedure: COLONOSCOPY WITH PROPOFOL ;  Surgeon: Wilhelmenia Aloha Raddle., MD;  Location: [facility] ENDOSCOPY;  Service: Gastroenterology;  Laterality: N/A;   EXTERNAL FIXATION LEG Right 08/11/2017   Procedure: EXTERNAL FIXATION, RIGHT KNEE;  Surgeon: Jerri Kay HERO, MD;  Location: [facility] OR;  Service: Orthopedics;  Laterality: Right;  FINE NEEDLE ASPIRATION Right 08/11/2017   Procedure: FINE NEEDLE ASPIRATION  of right Knee with 80 cc of dark red fluid removed;  Surgeon: Jerri Kay HERO, MD;  Location: [facility] OR;  Service: Orthopedics;  Laterality: Right;   FLEXIBLE SIGMOIDOSCOPY N/A 02/11/2018   Procedure: FLEXIBLE SIGMOIDOSCOPY;  Surgeon: Legrand Victory LITTIE DOUGLAS, MD;  Location: [facility] ENDOSCOPY;  Service: Gastroenterology;  Laterality: N/A;   FRACTURE SURGERY  2019   Broken legs   Jaw surgery (other)     NASAL SINUS SURGERY N/A 10/16/2012   Procedure: NASAL ENDOSCOPIC POLYPECTOMY/MAXILLARY  ANTROSTOMY/ETHMOIDECTOMY;  Surgeon: Ida Loader, MD;  Location: [facility] OR;  Service: ENT;  Laterality: N/A;   ORIF TIBIA PLATEAU Left 08/13/2017   Procedure: OPEN REDUCTION INTERNAL FIXATION (ORIF) TIBIAL PLATEAU;  Surgeon: Jerri Kay HERO, MD;  Location: [facility] OR;  Service: Orthopedics;  Laterality: Left;   ORIF TIBIA PLATEAU Right 08/22/2017   Procedure: OPEN REDUCTION INTERNAL FIXATION (ORIF) RIGHT BICONDYLAR TIBIAL PLATEAU, REMOVAL OF EX FIX;  Surgeon: Jerri Kay HERO, MD;  Location: [facility] OR;  Service: Orthopedics;  Laterality: Right;   POLYPECTOMY  04/21/2021   Procedure: POLYPECTOMY;  Surgeon: Mansouraty, Aloha Raddle., MD;  Location: [facility] ENDOSCOPY;  Service: Gastroenterology;;   RIGHT/LEFT HEART CATH AND CORONARY ANGIOGRAPHY N/A 07/12/2017   Procedure: RIGHT/LEFT HEART CATH AND CORONARY ANGIOGRAPHY;  Surgeon: Verlin Lonni BIRCH, MD;  Location: [facility] INVASIVE CV LAB;  Service: Cardiovascular;  Laterality: N/A;   SUBMUCOSAL INJECTION  02/11/2018   Procedure: SUBMUCOSAL INJECTION;  Surgeon: Legrand Victory LITTIE DOUGLAS, MD;  Location: [facility] ENDOSCOPY;  Service: Gastroenterology;;   TOOTH EXTRACTION     VALVE REPLACEMENT  03/06/1996   St. Jude, mitral    Social History   Socioeconomic History   Marital status: Married    Spouse name: Frenchie    Number of children: 2   Years of education: 20   Highest education level: Professional school degree (e.g., MD, DDS, DVM, JD)  Occupational History   Occupation: retired    Associate Professor: [employer]  Tobacco Use   Smoking status: Never   Smokeless tobacco: Never  Vaping Use   Vaping status: Never Used  Substance and Sexual Activity   Alcohol use: Never   Drug use: Never   Sexual activity: Not Currently    Birth control/protection: Abstinence  Other Topics Concern   Not on file  Social History Narrative   Lives alone. He has two children. He enjoys reading and house stuff.   Social Drivers of Corporate investment banker Strain: Low Risk  (12/04/2022)   Overall  Financial Resource Strain (CARDIA)    Difficulty of Paying Living Expenses: Not very hard  Food Insecurity: No Food Insecurity (12/04/2022)   Hunger Vital Sign    Worried About Running Out of Food in the Last Year: Never true    Ran Out of Food in the Last Year: Never true  Transportation Needs: No Transportation Needs (12/04/2022)   PRAPARE - Administrator, Civil Service (Medical): No    Lack of Transportation (Non-Medical): No  Physical Activity: Sufficiently Active (12/04/2022)   Exercise Vital Sign    Days of Exercise per Week: 5 days    Minutes of Exercise per Session: 30 min  Stress: No Stress Concern Present (12/04/2022)   [employer] of Occupational Health - Occupational Stress Questionnaire    Feeling of Stress : Not at all  Social Connections: Moderately Isolated (12/06/2022)   Social Connection and Isolation Panel    Frequency  of Communication with Friends and Family: More than three times a week    Frequency of Social Gatherings with Friends and Family: Three times a week    Attends Religious Services: Never    Active Member of Clubs or Organizations: Yes    Attends Banker Meetings: More than 4 times per year    Marital Status: Separated  Intimate Partner Violence: Not At Risk (12/06/2022)   Humiliation, Afraid, Rape, and Kick questionnaire    Fear of Current or Ex-Partner: No    Emotionally Abused: No    Physically Abused: No    Sexually Abused: No    Family History  Problem Relation Age of Onset   Heart attack Father 77   Heart disease Father    Colon cancer Mother 23   Hypertension Mother    Arthritis Mother    Cancer Mother    Breast cancer Maternal Aunt    Stomach cancer Neg Hx    Esophageal cancer Neg Hx    Inflammatory bowel disease Neg Hx    Liver disease Neg Hx    Pancreatic cancer Neg Hx     ROS: no fevers or chills, productive cough, hemoptysis, dysphasia, odynophagia, melena, hematochezia, dysuria, hematuria, rash,  seizure activity, orthopnea, PND, pedal edema, claudication. Remaining systems are negative.  Physical Exam: Well-developed well-nourished in no acute distress.  Skin is warm and dry.  HEENT is normal.  Neck is supple.  Chest is clear to auscultation with normal expansion.  Cardiovascular exam is regular rate and rhythm.  Crisp mechanical valve sound Abdominal exam nontender or distended. No masses palpated. Extremities show no edema. neuro grossly intact   A/P  1 nonischemic cardiomyopathy-patient noted to have angioedema with ACE inhibitor's in the past and is therefore not a candidate for ARB or Entresto.  Continue hydralazine /nitrates and Toprol .  2 status post mitral valve replacement-continue SBE prophylaxis, Coumadin  and aspirin .  3 mildly dilated aortic root-will arrange follow-up CTA.  4 lung nodule-plan follow-up CTA as outlined above.  5 coronary artery disease-patient denies chest pain.  Continue statin.  6 hypertension-patient's blood pressure is controlled.  Continue present medical regimen.  7 hyperlipidemia-continue statin.  Redell Shallow, MD

## 2023-09-12 ENCOUNTER — Telehealth: Payer: Self-pay | Admitting: *Deleted

## 2023-09-12 ENCOUNTER — Ambulatory Visit: Attending: Cardiology | Admitting: *Deleted

## 2023-09-12 DIAGNOSIS — Z7901 Long term (current) use of anticoagulants: Secondary | ICD-10-CM | POA: Diagnosis not present

## 2023-09-12 DIAGNOSIS — Z9889 Other specified postprocedural states: Secondary | ICD-10-CM | POA: Diagnosis not present

## 2023-09-12 LAB — POCT INR: INR: 4.8 — AB (ref 2.0–3.0)

## 2023-09-12 NOTE — Progress Notes (Signed)
Please see anticoagulation encounter.

## 2023-09-12 NOTE — Progress Notes (Unsigned)
 Care Guide Pharmacy Note  09/12/2023 Name: Corey Mathis MRN: [id number] DOB: [birth year]  Referred By: Alvan Dorothyann BIRCH, MD Reason for referral: Complex Care Management (Outreach to schedule initial with pharmacist ) and Call Attempt #1   Corey Mathis is a 73 y.o. year old male who is a primary care patient of Alvan, Dorothyann BIRCH, MD.  [name] was referred to the pharmacist for assistance related to: Trelegy  An unsuccessful telephone outreach was attempted today to contact the patient who was referred to the pharmacy team for assistance with medication assistance. Additional attempts will be made to contact the patient.  Thedford Franks, CMA [place]  [facility], [facility] Guide Direct Dial: [phone]  Fax: [phone] Website: [email or web address]

## 2023-09-12 NOTE — Patient Instructions (Signed)
 Description   Do not take any warfarin tomorrow and no warfarin Friday then continue taking Warfarin 1 tablet daily except 1/2 tablet on Sundays.  Stay consistent with greens each week (3 times per week).   Repeat INR in 2 weeks.  Coumadin  Clinic [phone]

## 2023-09-13 ENCOUNTER — Telehealth: Payer: Self-pay

## 2023-09-13 NOTE — Progress Notes (Signed)
 Care Guide Pharmacy Note  09/13/2023 Name: Corey Mathis MRN: [id number] DOB: [birth year]  Referred By: Alvan Dorothyann BIRCH, MD Reason for referral: Complex Care Management (Outreach to schedule initial with pharmacist ) and Call Attempt #1   Corey Mathis is a 73 y.o. year old male who is a primary care patient of Alvan Dorothyann BIRCH, MD.  Corey Mathis was referred to the pharmacist for assistance related to: trelegy  Successful contact was made with the patient to discuss pharmacy services including being ready for the pharmacist to call at least 5 minutes before the scheduled appointment time and to have medication bottles and any blood pressure readings ready for review. The patient agreed to meet with the pharmacist via telephone visit on 09/21/2023  Thedford Franks, CMA [place]  [facility], [facility] Guide Direct Dial: [phone]  Fax: [phone] Website: [email or web address]

## 2023-09-13 NOTE — Telephone Encounter (Signed)
 Copied from CRM [id number]. Topic: Clinical - Lab/Test Results >> Sep 12, 2023  1:18 PM Carrielelia G wrote: Pt Pollack returning call: I read his results verbatim.  Dorothyann JONETTA Byars, MD 09/08/2023  8:11 AM EDT   Your lab work is within acceptable range and there are no concerning findings.      He said thank you, no call back necessary.

## 2023-09-13 NOTE — Telephone Encounter (Signed)
 Patient informed of results by E2C2 representative

## 2023-09-21 ENCOUNTER — Other Ambulatory Visit: Payer: Self-pay

## 2023-09-21 ENCOUNTER — Telehealth: Payer: Self-pay

## 2023-09-21 NOTE — Progress Notes (Signed)
   09/21/2023  Patient ID: [name], male   DOB: 1951/01/04, 73 y.o.   MRN: [id number]  Subjective/Objective: Patient returned missed call from our scheduled telephone visit earlier today  Medication Assistance -Prescribed Trelegy for chronic bronchitis, but copay is approximately $100/month -Patient's insurance would cover generic Advair for lower copay, and patient states he has used this medication before but was switched to Trelegy to get the combination of 3 medications versus 2 -Currently has 13 days remaining of Trelegy at home  Assessment/Plan:  Medication Assistance -Patient qualifies for [employer] Patient Assistance Program for Trelegy based on [facility]; coordinating with medication assistance team to initiate application process -Endorses ability to refill Trelegy again if needed while awaiting determination from PAP  Follow-up:  Will monitor progress of PAP application to keep patient and provider informed  Channing DELENA Mealing, PharmD, DPLA

## 2023-09-21 NOTE — Progress Notes (Signed)
   09/21/2023  Patient ID: Corey Mathis, male   DOB: 04-06-1950, 73 y.o.   MRN: [id number]  Outreach attempt for scheduled telephone visit to assist with affordability of Trelegy unsuccessful,  I attempted to call x3 and could not reach patient; unable to leave message due to voicemail being full.  Routing to care guide to see if we can get visit rescheduled.  Channing DELENA Mealing, PharmD, DPLA

## 2023-09-21 NOTE — Telephone Encounter (Signed)
 PAP: Patient assistance application for Trelegy Ellipta  through [employer] ([employer]) has been mailed to pt's home address on file. Provider portion of application will be faxed to provider's office.

## 2023-09-24 ENCOUNTER — Ambulatory Visit: Attending: Cardiology | Admitting: Cardiology

## 2023-09-24 ENCOUNTER — Encounter: Payer: Self-pay | Admitting: Cardiology

## 2023-09-24 ENCOUNTER — Ambulatory Visit (INDEPENDENT_AMBULATORY_CARE_PROVIDER_SITE_OTHER)

## 2023-09-24 VITALS — BP 110/74 | HR 73 | Ht 76.0 in | Wt 187.8 lb

## 2023-09-24 DIAGNOSIS — I428 Other cardiomyopathies: Secondary | ICD-10-CM | POA: Insufficient documentation

## 2023-09-24 DIAGNOSIS — Z7901 Long term (current) use of anticoagulants: Secondary | ICD-10-CM | POA: Insufficient documentation

## 2023-09-24 DIAGNOSIS — I77819 Aortic ectasia, unspecified site: Secondary | ICD-10-CM | POA: Diagnosis not present

## 2023-09-24 DIAGNOSIS — Z9889 Other specified postprocedural states: Secondary | ICD-10-CM

## 2023-09-24 DIAGNOSIS — I2510 Atherosclerotic heart disease of native coronary artery without angina pectoris: Secondary | ICD-10-CM | POA: Insufficient documentation

## 2023-09-24 DIAGNOSIS — I10 Essential (primary) hypertension: Secondary | ICD-10-CM | POA: Insufficient documentation

## 2023-09-24 DIAGNOSIS — Z952 Presence of prosthetic heart valve: Secondary | ICD-10-CM | POA: Diagnosis not present

## 2023-09-24 LAB — POCT INR: INR: 4.3 — AB (ref 2.0–3.0)

## 2023-09-24 NOTE — Patient Instructions (Signed)
 Do not take any warfarin tomorrow  then continue taking Warfarin 1 tablet daily except 1/2 tablet on Sundays.  Stay consistent with greens each week (3 times per week).   Repeat INR in 3 weeks.  Coumadin  Clinic [phone]

## 2023-09-24 NOTE — Patient Instructions (Signed)
   Testing/Procedures:  CTA OF THE CHEST AT [name] [facility]  Follow-Up: At [facility], you and your health needs are our priority.  As part of our continuing mission to provide you with exceptional heart care, our providers are all part of one team.  This team includes your primary Cardiologist (physician) and Advanced Practice Providers or APPs (Physician Assistants and Nurse Practitioners) who all work together to provide you with the care you need, when you need it.  Your next appointment:   12 month(s)  Provider:   Redell Shallow, MD

## 2023-09-24 NOTE — Progress Notes (Signed)
 Inr 4.3  Please see anticoagulation encounter

## 2023-10-01 ENCOUNTER — Telehealth: Payer: Self-pay | Admitting: *Deleted

## 2023-10-01 NOTE — Progress Notes (Signed)
 Complex Care Management Care Guide Note  10/01/2023 Name: Corey Mathis MRN: [id number] DOB: 12-01-1950  Corey Mathis is a 73 y.o. year old male who is a primary care patient of Alvan Dorothyann BIRCH, MD and is actively engaged with the care management team. I reached out to Corey Mathis by phone today to assist with re-scheduling  with the Pharmacist.  Follow up plan: Unsuccessful telephone outreach attempt made. A HIPAA compliant phone message was left for the patient providing contact information and requesting a return call. No further outreach attempts will be made due to inability to maintain patient contact.   Corey Mathis, CMA [place]  [facility], [facility] Guide Direct Dial: [phone]  Fax: [phone] Website: [email or web address]

## 2023-10-02 NOTE — Telephone Encounter (Signed)
 Reached out to Patient regarding PAP application for Trelegy ([employer]) and he did receive the application and will return soon.

## 2023-10-03 ENCOUNTER — Ambulatory Visit (HOSPITAL_COMMUNITY)
Admission: RE | Admit: 2023-10-03 | Discharge: 2023-10-03 | Disposition: A | Discharge status: Home / Self Care | Source: Ambulatory Visit | Attending: Cardiovascular Disease | Admitting: Cardiovascular Disease

## 2023-10-03 DIAGNOSIS — J479 Bronchiectasis, uncomplicated: Secondary | ICD-10-CM | POA: Insufficient documentation

## 2023-10-03 DIAGNOSIS — I77819 Aortic ectasia, unspecified site: Secondary | ICD-10-CM

## 2023-10-03 DIAGNOSIS — I700 Atherosclerosis of aorta: Secondary | ICD-10-CM | POA: Insufficient documentation

## 2023-10-03 DIAGNOSIS — I7121 Aneurysm of the ascending aorta, without rupture: Secondary | ICD-10-CM | POA: Insufficient documentation

## 2023-10-03 DIAGNOSIS — Q200 Common arterial trunk: Secondary | ICD-10-CM | POA: Diagnosis not present

## 2023-10-03 MED ORDER — IOHEXOL 350 MG/ML SOLN
100.0000 mL | Freq: Once | INTRAVENOUS | Status: AC | PRN
  Administered 2023-10-03: 100 mL via INTRAVENOUS

## 2023-10-10 ENCOUNTER — Other Ambulatory Visit: Payer: Self-pay | Admitting: Family Medicine

## 2023-10-10 ENCOUNTER — Ambulatory Visit: Payer: Self-pay | Admitting: Cardiology

## 2023-10-10 DIAGNOSIS — I7121 Aneurysm of the ascending aorta, without rupture: Secondary | ICD-10-CM

## 2023-10-15 ENCOUNTER — Ambulatory Visit: Attending: Cardiology

## 2023-10-15 DIAGNOSIS — Z9889 Other specified postprocedural states: Secondary | ICD-10-CM | POA: Diagnosis not present

## 2023-10-15 DIAGNOSIS — Z7901 Long term (current) use of anticoagulants: Secondary | ICD-10-CM | POA: Insufficient documentation

## 2023-10-15 LAB — POCT INR: INR: 2.2 (ref 2.0–3.0)

## 2023-10-15 NOTE — Progress Notes (Signed)
 INR 2.2; Please see anticoagulation encounter

## 2023-10-15 NOTE — Patient Instructions (Signed)
 Description   Take 1.5 tablets today and then continue taking Warfarin 1 tablet daily except 1/2 tablet on Sundays.  Stay consistent with greens each week (3 times per week).   Repeat INR in 3 weeks.  Coumadin  Clinic [phone]

## 2023-10-18 NOTE — Telephone Encounter (Signed)
 Reached out to patient regarding PAP Application for Trelegy ([employer]) and patient did mail out 10/15/23 should arrive soon to office.

## 2023-10-19 ENCOUNTER — Telehealth: Payer: Self-pay

## 2023-10-19 NOTE — Progress Notes (Signed)
   10/19/2023  Patient ID: Corey Mathis, male   DOB: [birth year], 73 y.o.   MRN: [id number]  In basket message from medication assistance team stating patient and provider portions of [employer] patient assistance application for Trelegy have been received.  Team is faxing completed application to [employer], and a prescription for Trelegy (3 month supply with 4 additional refills) needs to be faxed to [employer] (fax # [phone]) by provider.  Routing to Dr. Alvan to send prescription.  Channing DELENA Mealing, PharmD, DPLA

## 2023-10-19 NOTE — Telephone Encounter (Signed)
 PAP: Application for Trelegy has been submitted to [employer] ([employer]), via fax

## 2023-10-22 MED ORDER — TRELEGY ELLIPTA 100-62.5-25 MCG/ACT IN AEPB: INHALATION_SPRAY | RESPIRATORY_TRACT | 3 refills | Status: AC

## 2023-10-22 NOTE — Telephone Encounter (Signed)
Printed, pls fax

## 2023-10-22 NOTE — Addendum Note (Signed)
 Addended by: Michaeljoseph Revolorio D on: 10/22/2023 11:19 AM   Modules accepted: Orders

## 2023-10-23 NOTE — Telephone Encounter (Signed)
 Fax sent confirmation received.

## 2023-10-29 NOTE — Telephone Encounter (Signed)
 PAP: Patient assistance application for Trelegy Ellipta  has been approved by PAP Companies: [employer] from 10/25/2023 to 03/05/2024. Medication should be delivered to PAP Delivery: Home. For further shipping updates, please contact [employer] ([employer]) at [phone]. Patient ID is: not provided. Medication was processed and shipped today- allow 7-10 business days to be delivered.

## 2023-11-06 ENCOUNTER — Telehealth: Payer: Self-pay

## 2023-11-06 ENCOUNTER — Ambulatory Visit: Attending: Cardiology | Admitting: *Deleted

## 2023-11-06 DIAGNOSIS — Z9889 Other specified postprocedural states: Secondary | ICD-10-CM | POA: Insufficient documentation

## 2023-11-06 DIAGNOSIS — Z7901 Long term (current) use of anticoagulants: Secondary | ICD-10-CM | POA: Diagnosis not present

## 2023-11-06 LAB — POCT INR: INR: 2 (ref 2.0–3.0)

## 2023-11-06 NOTE — Progress Notes (Signed)
   11/06/2023  Patient ID: Corey Mathis, male   DOB: [birth year], 73 y.o.   MRN: [id number]  Patient outreach to follow-up in regard to Trelegy affordability/access.  Patient was approved to receive Trelegy through [employer] patient assistance program, and he states the medication was delivered to his home on Friday.  Informed patient he will continue to receive the medication through the end of this year, and the medication assistance team will assist him with re-enrollment for 2026 once this opens up.  Channing DELENA Mealing, PharmD, DPLA

## 2023-11-06 NOTE — Progress Notes (Signed)
 Description   INR-2.0; Take 1.5 tablets today and then continue taking Warfarin 1 tablet daily except 1/2 tablet on Sundays.  Stay consistent with greens each week (3 times per week).   Repeat INR in 3 weeks.  Coumadin  Clinic [phone]

## 2023-11-06 NOTE — Patient Instructions (Signed)
 Description   INR-2.0; Take 1.5 tablets today and then continue taking Warfarin 1 tablet daily except 1/2 tablet on Sundays.  Stay consistent with greens each week (3 times per week).   Repeat INR in 3 weeks.  Coumadin  Clinic [phone]

## 2023-11-12 ENCOUNTER — Other Ambulatory Visit: Payer: Self-pay | Admitting: Cardiology

## 2023-11-12 DIAGNOSIS — E7800 Pure hypercholesterolemia, unspecified: Secondary | ICD-10-CM

## 2023-11-12 DIAGNOSIS — I428 Other cardiomyopathies: Secondary | ICD-10-CM

## 2023-11-27 ENCOUNTER — Ambulatory Visit: Attending: Cardiology | Admitting: *Deleted

## 2023-11-27 DIAGNOSIS — Z7901 Long term (current) use of anticoagulants: Secondary | ICD-10-CM | POA: Insufficient documentation

## 2023-11-27 DIAGNOSIS — Z9889 Other specified postprocedural states: Secondary | ICD-10-CM | POA: Insufficient documentation

## 2023-11-27 LAB — POCT INR: INR: 4.2 — AB (ref 2.0–3.0)

## 2023-11-27 NOTE — Patient Instructions (Signed)
 Description   INR-4.2; Do not take any warfarin today then continue taking Warfarin 1 tablet daily except 1/2 tablet on Sundays.  Stay consistent with greens each week (3 times per week).   Repeat INR in 3 weeks.  Coumadin  Clinic [phone]

## 2023-11-27 NOTE — Progress Notes (Signed)
 Description   INR-4.2; Do not take any warfarin today then continue taking Warfarin 1 tablet daily except 1/2 tablet on Sundays.  Stay consistent with greens each week (3 times per week).   Repeat INR in 3 weeks.  Coumadin  Clinic [phone]

## 2023-12-05 ENCOUNTER — Other Ambulatory Visit: Payer: Self-pay | Admitting: Family Medicine

## 2023-12-05 DIAGNOSIS — F411 Generalized anxiety disorder: Secondary | ICD-10-CM

## 2023-12-11 ENCOUNTER — Ambulatory Visit: Payer: Medicare Other

## 2023-12-11 VITALS — Ht 76.0 in | Wt 185.0 lb

## 2023-12-11 DIAGNOSIS — Z0000 Encounter for general adult medical examination without abnormal findings: Secondary | ICD-10-CM | POA: Diagnosis not present

## 2023-12-11 NOTE — Patient Instructions (Signed)
  [name] , Thank you for taking time to come for your Medicare Wellness Visit. I appreciate your ongoing commitment to your health goals. Please review the following plan we discussed and let me know if I can assist you in the future.   These are the goals we discussed:  Goals       Patient Stated (pt-stated)      He would like get all of his dental work done.      Patient Stated (pt-stated)      12/02/2021 AWV Goal: Exercise for General Health  Patient will verbalize understanding of the benefits of increased physical activity: Exercising regularly is important. It will improve your overall fitness, flexibility, and endurance. Regular exercise also will improve your overall health. It can help you control your weight, reduce stress, and improve your bone density. Over the next year, patient will increase physical activity as tolerated with a goal of at least 150 minutes of moderate physical activity per week.  You can tell that you are exercising at a moderate intensity if your heart starts beating faster and you start breathing faster but can still hold a conversation. Moderate-intensity exercise ideas include: Walking 1 mile (1.6 km) in about 15 minutes Biking Hiking Golfing Dancing Water aerobics Patient will verbalize understanding of everyday activities that increase physical activity by providing examples like the following: Yard work, such as: Insurance underwriter Gardening Washing windows or floors Patient will be able to explain general safety guidelines for exercising:  Before you start a new exercise program, talk with your health care provider. Do not exercise so much that you hurt yourself, feel dizzy, or get very short of breath. Wear comfortable clothes and wear shoes with good support. Drink plenty of water while you exercise to prevent dehydration or heat stroke. Work out until  your breathing and your heartbeat get faster.       Patient Stated (pt-stated)      Patient stated he would like to work on his muscle tone and his breathing.      Patient Stated      Patient states he would like to stay healthy and go jogging.         This is a list of the screening recommended for you and due dates:  Health Maintenance  Topic Date Due   Flu Shot  10/05/2023   COVID-19 Vaccine (9 - 2025-26 season) 11/05/2023   Colon Cancer Screening  04/21/2024   Medicare Annual Wellness Visit  12/10/2024   DTaP/Tdap/Td vaccine (4 - Td or Tdap) 11/25/2030   Pneumococcal Vaccine for age over 77  Completed   Hepatitis C Screening  Completed   Zoster (Shingles) Vaccine  Completed   Meningitis B Vaccine  Aged Out   Hepatitis B Vaccine  Discontinued

## 2023-12-11 NOTE — Progress Notes (Signed)
 Subjective:   Corey Mathis is a 73 y.o. male who presents for Medicare Annual/Subsequent preventive examination.  Visit Complete: Virtual I connected with  Corey Mathis on 12/11/23 by a audio enabled telemedicine application and verified that I am speaking with the correct person using two identifiers.  Patient Location: Home  Provider Location: Office/Clinic  I discussed the limitations of evaluation and management by telemedicine. The patient expressed understanding and agreed to proceed.  Vital Signs: Because this visit was a virtual/telehealth visit, some criteria may be missing or patient reported. Any vitals not documented were not able to be obtained and vitals that have been documented are patient reported.  Patient Medicare AWV questionnaire was completed by the patient on n/a; I have confirmed that all information answered by patient is correct and no changes since this date.  Cardiac Risk Factors include: advanced age (>56men, >71 women);dyslipidemia;family history of premature cardiovascular disease;hypertension;male gender     Objective:    Today's Vitals   12/11/23 0859  Weight: 185 lb (83.9 kg)  Height: 6' 4 (1.93 m)   Body mass index is 22.52 kg/m.     12/11/2023    9:07 AM 12/06/2022    8:50 AM 12/02/2021   10:13 AM 10/03/2021   12:05 PM 04/21/2021    9:35 AM 10/29/2020    2:46 PM 02/11/2018    9:27 AM  Advanced Directives  Does Patient Have a Medical Advance Directive? Yes Yes Yes Yes Yes Yes No   Type of Estate agent of [place];Living will Living will Living will Living will;Healthcare Power of Attorney Healthcare Power of Attorney Living will;Healthcare Power of Attorney   Does patient want to make changes to medical advance directive? No - Patient declined No - Patient declined No - Patient declined   No - Patient declined   Copy of Healthcare Power of Attorney in Chart?     No - copy requested No - copy requested   Would  patient like information on creating a medical advance directive?      No - Patient declined No - Patient declined      Data saved with a previous flowsheet row definition    Current Medications (verified) Outpatient Encounter Medications as of 12/11/2023  Medication Sig   albuterol  (VENTOLIN  HFA) 108 (90 Base) MCG/ACT inhaler INHALE 2 PUFFS BY MOUTH EVERY 6 HOURS AS NEEDED FOR WHEEZING FOR SHORTNESS OF BREATH   antiseptic oral rinse (BIOTENE) LIQD 5 mLs by Mouth Rinse route as needed for dry mouth or mouth pain.   aspirin  81 MG EC tablet Take 81 mg by mouth at bedtime.   atorvastatin  (LIPITOR) 40 MG tablet Take 1 tablet by mouth once daily   fluticasone  (FLONASE ) 50 MCG/ACT nasal spray Place 1 spray into both nostrils daily as needed (congestion).   Fluticasone -Umeclidin-Vilant (TRELEGY ELLIPTA ) 100-62.5-25 MCG/ACT AEPB Inhale 1 puff by mouth into the lungs daily.   hydrALAZINE  (APRESOLINE ) 50 MG tablet Take 1 tablet (50 mg total) by mouth 3 (three) times daily.   isosorbide  mononitrate (IMDUR ) 60 MG 24 hr tablet Take 1 tablet by mouth once daily   metoprolol  succinate (TOPROL -XL) 100 MG 24 hr tablet Take 1 tablet (100 mg total) by mouth daily.   Multiple Vitamin (MULTIVITAMIN WITH MINERALS) TABS tablet Take 1 tablet by mouth at bedtime.   PARoxetine  (PAXIL ) 30 MG tablet Take 1 tablet by mouth once daily   warfarin (COUMADIN ) 10 MG tablet TAKE 1 TABLET BY MOUTH ONCE DAILY  AS DIRECTED BY COUMADIN  CLINIC.   No facility-administered encounter medications on file as of 12/11/2023.    Allergies (verified) Lisinopril, Carvedilol, Ezetimibe-simvastatin, Propranolol hcl, Ramipril, and Codeine   History: Past Medical History:  Diagnosis Date   Allergy    Anxiety    Asthma    CHF (congestive heart failure) (HCC)    Clotting disorder 1998   Warfarin   COPD (chronic obstructive pulmonary disease) (HCC) 2024   Listed   Dyslipidemia    GERD (gastroesophageal reflux disease) 05/09/2023   Heart  murmur 1980   Listed   History of mitral valve replacement    Hypertension    Labyrinthitis    hx   Past Surgical History:  Procedure Laterality Date   ACNE CYST REMOVAL     from hand and back   CARDIAC VALVE REPLACEMENT  1998   Listed   COLONOSCOPY WITH PROPOFOL  N/A 04/21/2021   Procedure: COLONOSCOPY WITH PROPOFOL ;  Surgeon: Wilhelmenia Aloha Raddle., MD;  Location[facility] END[facility]COPY;  Service: Gastroenterology;  Laterality: N/A;   EXTERNAL FIXATION LEG Right 08/11/2017   Procedure: EXTERNAL FIXATION, RIGHT KNEE;  Surgeon: Jerri Kay HERO, MD;  Location: [facility] OR;  Service: Orthopedics;  Laterality: Right;   FINE NEEDLE ASPIRATION Right 08/11/2017   Procedure: FINE NEEDLE ASPIRATION  of right Knee with 80 cc of dark red fluid removed;  Surgeon: Jerri Kay HERO, MD;  Location: [facility] OR;  Service: Orthopedics;  Laterality: Right;   FLEXIBLE SIGMOIDOSCOPY N/A 02/11/2018   Procedure: FLEXIBLE SIGMOIDOSCOPY;  Surgeon: Legrand Victory LITTIE DOUGLAS, MD;  Location: [facility] ENDOSCOPY;  Service: Gastroenterology;  Laterality: N/A;   FRACTURE SURGERY  2019   Broken legs   Jaw surgery (other)     NASAL SINUS SURGERY N/A 10/16/2012   Procedure: NASAL ENDOSCOPIC POLYPECTOMY/MAXILLARY ANTROSTOMY/ETHMOIDECTOMY;  Surgeon: Ida Loader, MD;  Location: [facility] OR;  Service: ENT;  Laterality: N/A;   ORIF TIBIA PLATEAU Left 08/13/2017   Procedure: OPEN REDUCTION INTERNAL FIXATION (ORIF) TIBIAL PLATEAU;  Surgeon: Jerri Kay HERO, MD;  Location: [facility] OR;  Service: Orthopedics;  Laterality: Left;   ORIF TIBIA PLATEAU Right 08/22/2017   Procedure: OPEN REDUCTION INTERNAL FIXATION (ORIF) RIGHT BICONDYLAR TIBIAL PLATEAU, REMOVAL OF EX FIX;  Surgeon: Jerri Kay HERO, MD;  Location: [facility] OR;  Service: Orthopedics;  Laterality: Right;   POLYPECTOMY  04/21/2021   Procedure: POLYPECTOMY;  Surgeon: Mansouraty, Aloha Raddle., MD;  Location: [facility] ENDOSCOPY;  Service: Gastroenterology;;   RIGHT/LEFT HEART CATH AND CORONARY ANGIOGRAPHY N/A 07/12/2017   Procedure:  RIGHT/LEFT HEART CATH AND CORONARY ANGIOGRAPHY;  Surgeon: Verlin Lonni BIRCH, MD;  Location: [facility] INVASIVE CV LAB;  Service: Cardiovascular;  Laterality: N/A;   SUBMUCOSAL INJECTION  02/11/2018   Procedure: SUBMUCOSAL INJECTION;  Surgeon: Legrand Victory LITTIE DOUGLAS, MD;  Location: [facility] ENDOSCOPY;  Service: Gastroenterology;;   TOOTH EXTRACTION     VALVE REPLACEMENT  03/06/1996   St. Jude, mitral   Family History  Problem Relation Age of Onset   Heart attack Father 30   Heart disease Father    Colon cancer Mother 33   Hypertension Mother    Arthritis Mother    Cancer Mother    Breast cancer Maternal Aunt    Stomach cancer Neg Hx    Esophageal cancer Neg Hx    Inflammatory bowel disease Neg Hx    Liver disease Neg Hx    Pancreatic cancer Neg Hx    Social History   Socioeconomic History   Marital status: Married    Spouse  name: Mont    Number of children: 2   Years of education: 20   Highest education level: Professional school degree (e.g., MD, DDS, DVM, JD)  Occupational History   Occupation: retired    Associate Professor: [employer]  Tobacco Use   Smoking status: Never   Smokeless tobacco: Never  Vaping Use   Vaping status: Never Used  Substance and Sexual Activity   Alcohol use: Never   Drug use: Never   Sexual activity: Not Currently    Birth control/protection: Abstinence  Other Topics Concern   Not on file  Social History Narrative   Lives alone. He has two children. He enjoys reading and house stuff.   Social Drivers of Corporate investment banker Strain: Low Risk  (12/11/2023)   Overall Financial Resource Strain (CARDIA)    Difficulty of Paying Living Expenses: Not hard at all  Food Insecurity: No Food Insecurity (12/11/2023)   Hunger Vital Sign    Worried About Running Out of Food in the Last Year: Never true    Ran Out of Food in the Last Year: Never true  Transportation Needs: No Transportation Needs (12/11/2023)   PRAPARE - Scientist, research (physical sciences) (Medical): No    Lack of Transportation (Non-Medical): No  Physical Activity: Sufficiently Active (12/11/2023)   Exercise Vital Sign    Days of Exercise per Week: 5 days    Minutes of Exercise per Session: 30 min  Stress: No Stress Concern Present (12/11/2023)   [employer] of Occupational Health - Occupational Stress Questionnaire    Feeling of Stress: Not at all  Social Connections: Moderately Integrated (12/11/2023)   Social Connection and Isolation Panel    Frequency of Communication with Friends and Family: More than three times a week    Frequency of Social Gatherings with Friends and Family: More than three times a week    Attends Religious Services: More than 4 times per year    Active Member of [employer] or Organizations: Yes    Attends Engineer, structural: More than 4 times per year    Marital Status: Separated    Tobacco Counseling Counseling given: Not Answered   Clinical Intake:  Pre-visit preparation completed: Yes  Pain : No/denies pain     BMI - recorded: 22.52 Nutritional Status: BMI of 19-24  Normal Nutritional Risks: None Diabetes: No  How often do you need to have someone help you when you read instructions, pamphlets, or other written materials from your doctor or pharmacy?: 1 - Never What is the last grade level you completed in school?: 18  Interpreter Needed?: No      Activities of Daily Living    12/11/2023    9:00 AM  In your present state of health, do you have any difficulty performing the following activities:  Hearing? 0  Vision? 0  Difficulty concentrating or making decisions? 0  Walking or climbing stairs? 0  Dressing or bathing? 0  Doing errands, shopping? 0  Preparing Food and eating ? N  Using the Toilet? N  In the past six months, have you accidently leaked urine? N  Do you have problems with loss of bowel control? N  Managing your Medications? N  Managing your Finances? N  Housekeeping or managing  your Housekeeping? N    Patient Care Team: Alvan Dorothyann BIRCH, MD as PCP - General (Family Medicine) Pietro Redell RAMAN, MD as PCP - Cardiology (Cardiology) Alvan Dorothyann BIRCH, MD (Family Medicine) Shelah,  Lamar RAMAN, MD as Consulting Physician (Pulmonary Disease)  Indicate any recent Medical Services you may have received from other than [facility] providers in the past year (date may be approximate).     Assessment:   This is a routine wellness examination for [place].  Hearing/Vision screen No results found.   Goals Addressed             This Visit's Progress    Patient Stated       Patient states he would like to stay healthy and go jogging.        Depression Screen    12/11/2023    9:06 AM 09/06/2023    2:23 PM 03/09/2023    2:37 PM 12/06/2022    8:52 AM 09/04/2022    4:11 PM 12/02/2021   10:11 AM 08/26/2021    1:30 PM  PHQ 2/9 Scores  PHQ - 2 Score 0 0 0 0 0 0 0  PHQ- 9 Score  0         Fall Risk    12/11/2023    9:07 AM 09/06/2023    2:23 PM 03/09/2023    2:37 PM 12/06/2022    8:51 AM 12/04/2022    5:08 PM  Fall Risk   Falls in the past year? 0 0 1 0 0  Number falls in past yr: 0 0 0 0 0  Injury with Fall? 0 0 0 0 0  Risk for fall due to :  No Fall Risks No Fall Risks No Fall Risks   Follow up Falls evaluation completed Falls evaluation completed Falls evaluation completed Falls evaluation completed     MEDICARE RISK AT HOME: Medicare Risk at Home Any stairs in or around the home?: Yes If so, are there any without handrails?: Yes Home free of loose throw rugs in walkways, pet beds, electrical cords, etc?: Yes Adequate lighting in your home to reduce risk of falls?: Yes Life alert?: No Use of a cane, walker or w/c?: No Grab bars in the bathroom?: Yes Shower chair or bench in shower?: No Elevated toilet seat or a handicapped toilet?: No  TIMED UP AND GO:  Was the test performed?  No    Cognitive Function:        12/11/2023    9:09 AM 12/06/2022    8:54  AM 12/02/2021   10:13 AM 10/29/2020    2:55 PM 11/01/2017    9:40 AM  6CIT Screen  What Year? 0 points 0 points 0 points 0 points 0 points  What month? 0 points 0 points 0 points 0 points 0 points  What time? 0 points 0 points 0 points 0 points 0 points  Count back from 20 0 points 0 points 0 points 0 points 0 points  Months in reverse 0 points 0 points 0 points 0 points 0 points  Repeat phrase 0 points 0 points 0 points 0 points 0 points  Total Score 0 points 0 points 0 points 0 points 0 points    Immunizations Immunization History  Administered Date(s) Administered   Fluad Quad(high Dose 65+) 10/31/2018, 10/28/2021   Hep A / Hep B 04/27/2005, 01/10/2006, 09/29/2021   INFLUENZA, HIGH DOSE SEASONAL PF 11/04/2016, 11/26/2017, 11/28/2022   Influenza Split 02/10/2011   Influenza,inj,Quad PF,6+ Mos 01/13/2013, 01/26/2014, 01/12/2015   Influenza-Unspecified 12/26/2019, 11/24/2020   Moderna Covid-19 Fall Seasonal Vaccine 28yrs & older 06/06/2022   PFIZER Comirnaty(Gray Top)Covid-19 Tri-Sucrose Vaccine 11/28/2022   PFIZER(Purple Top)SARS-COV-2 Vaccination 04/07/2019, 04/29/2019, 12/26/2019, 08/16/2020, 02/01/2022  Research officer, trade union 48yrs & up 11/24/2020   Pneumococcal Conjugate-13 09/23/2013   Pneumococcal Polysaccharide-23 03/13/2011, 05/27/2018   Pneumococcal-Unspecified 05/10/2016   RSV,unspecified 02/07/2022   Tdap 03/07/1999, 08/10/2015, 11/24/2020   Zoster Recombinant(Shingrix) 01/25/2019, 09/29/2021    TDAP status: Up to date  Flu Vaccine status: Due, Education has been provided regarding the importance of this vaccine. Advised may receive this vaccine at local pharmacy or Health Dept. Aware to provide a copy of the vaccination record if obtained from local pharmacy or Health Dept. Verbalized acceptance and understanding.  Pneumococcal vaccine status: Up to date  Covid-19 vaccine status: Information provided on how to obtain vaccines.   Qualifies for  Shingles Vaccine? Yes   Zostavax completed No   Shingrix Completed?: Yes  Screening Tests Health Maintenance  Topic Date Due   Influenza Vaccine  10/05/2023   COVID-19 Vaccine (9 - 2025-26 season) 11/05/2023   Colonoscopy  04/21/2024   Medicare Annual Wellness (AWV)  12/10/2024   DTaP/Tdap/Td (4 - Td or Tdap) 11/25/2030   Pneumococcal Vaccine: 50+ Years  Completed   Hepatitis C Screening  Completed   Zoster Vaccines- Shingrix  Completed   Meningococcal B Vaccine  Aged Out   Hepatitis B Vaccines 19-59 Average Risk  Discontinued    Health Maintenance  Health Maintenance Due  Topic Date Due   Influenza Vaccine  10/05/2023   COVID-19 Vaccine (9 - 2025-26 season) 11/05/2023    Colorectal cancer screening: Type of screening: Colonoscopy. Completed 04/21/2021. Repeat every 3 years  Lung Cancer Screening: (Low Dose CT Chest recommended if Age 64-80 years, 20 pack-year currently smoking OR have quit w/in 15years.) does not qualify.   Lung Cancer Screening Referral: n/a  Additional Screening:  Hepatitis C Screening: does not qualify; Completed 05/28/2014  Vision Screening: Recommended annual ophthalmology exams for early detection of glaucoma and other disorders of the eye. Is the patient up to date with their annual eye exam?  Yes  Who is the provider or what is the name of the office in which the patient attends annual eye exams? [facility] If pt is not established with a provider, would they like to be referred to a provider to establish care? N/a.   Dental Screening: Recommended annual dental exams for proper oral hygiene   Community Resource Referral / Chronic Care Management: CRR required this visit?  No   CCM required this visit?  No     Plan:     I have personally reviewed and noted the following in the patient's chart:   Medical and social history Use of alcohol, tobacco or illicit drugs  Current medications and supplements including opioid  prescriptions. Patient is not currently taking opioid prescriptions. Functional ability and status Nutritional status Physical activity Advanced directives List of other physicians Hospitalizations, surgeries, and ER visits in previous 12 months. None Vitals Screenings to include cognitive, depression, and falls Referrals and appointments  In addition, I have reviewed and discussed with patient certain preventive protocols, quality metrics, and best practice recommendations. A written personalized care plan for preventive services as well as general preventive health recommendations were provided to patient.     Bonny Jon Mayor, CMA   12/11/2023   After Visit Summary: (MyChart) Due to this being a telephonic visit, the after visit summary with patients personalized plan was offered to patient via MyChart   Nurse Notes:   LADERIUS VALBUENA is a 73 y.o. male patient of Metheney, Dorothyann BIRCH, MD who had a  Medicare Annual Wellness Visit today via telephone. Annette is Retired and lives with their spouse. He has 2 children. He reports that he is socially active and does interact with friends/family regularly. He is moderately physically active and enjoys reading and doing house stuff.

## 2023-12-13 ENCOUNTER — Other Ambulatory Visit: Payer: Self-pay | Admitting: Family Medicine

## 2023-12-13 DIAGNOSIS — F411 Generalized anxiety disorder: Secondary | ICD-10-CM

## 2023-12-18 ENCOUNTER — Other Ambulatory Visit: Payer: Self-pay | Admitting: Family Medicine

## 2023-12-18 ENCOUNTER — Ambulatory Visit

## 2023-12-18 DIAGNOSIS — F411 Generalized anxiety disorder: Secondary | ICD-10-CM

## 2023-12-31 ENCOUNTER — Ambulatory Visit: Attending: Cardiology | Admitting: *Deleted

## 2023-12-31 DIAGNOSIS — Z9889 Other specified postprocedural states: Secondary | ICD-10-CM | POA: Diagnosis not present

## 2023-12-31 DIAGNOSIS — Z7901 Long term (current) use of anticoagulants: Secondary | ICD-10-CM | POA: Diagnosis not present

## 2023-12-31 LAB — POCT INR: INR: 4 — AB (ref 2.0–3.0)

## 2023-12-31 NOTE — Progress Notes (Signed)
 Description   INR-4.0; Do not take any warfarin today then continue taking Warfarin 1 tablet daily except 1/2 tablet on Sundays.  Stay consistent with greens each week (3 times per week).   Repeat INR in 3 weeks.  Coumadin  Clinic [phone]

## 2023-12-31 NOTE — Patient Instructions (Signed)
 Description   INR-4.0; Do not take any warfarin today then continue taking Warfarin 1 tablet daily except 1/2 tablet on Sundays.  Stay consistent with greens each week (3 times per week).   Repeat INR in 3 weeks.  Coumadin  Clinic [phone]

## 2024-01-23 ENCOUNTER — Ambulatory Visit: Attending: Cardiology | Admitting: *Deleted

## 2024-01-23 DIAGNOSIS — Z9889 Other specified postprocedural states: Secondary | ICD-10-CM | POA: Diagnosis not present

## 2024-01-23 DIAGNOSIS — Z7901 Long term (current) use of anticoagulants: Secondary | ICD-10-CM | POA: Insufficient documentation

## 2024-01-23 LAB — POCT INR: INR: 2.6 (ref 2.0–3.0)

## 2024-01-23 NOTE — Progress Notes (Signed)
 Description   INR-2.6; Continue taking Warfarin 1 tablet daily except 1/2 tablet on Sundays.  Stay consistent with greens each week (3 times per week).   Repeat INR in 4 weeks.  Coumadin  Clinic [phone]

## 2024-01-23 NOTE — Patient Instructions (Signed)
 Description   INR-2.6; Continue taking Warfarin 1 tablet daily except 1/2 tablet on Sundays.  Stay consistent with greens each week (3 times per week).   Repeat INR in 4 weeks.  Coumadin  Clinic [phone]

## 2024-02-01 DIAGNOSIS — Z23 Encounter for immunization: Secondary | ICD-10-CM | POA: Diagnosis not present

## 2024-02-20 ENCOUNTER — Ambulatory Visit: Attending: Cardiology

## 2024-02-20 DIAGNOSIS — Z952 Presence of prosthetic heart valve: Secondary | ICD-10-CM | POA: Insufficient documentation

## 2024-02-20 DIAGNOSIS — Z9889 Other specified postprocedural states: Secondary | ICD-10-CM | POA: Insufficient documentation

## 2024-02-20 DIAGNOSIS — Z7901 Long term (current) use of anticoagulants: Secondary | ICD-10-CM | POA: Insufficient documentation

## 2024-02-20 LAB — POCT INR: INR: 2.6 (ref 2.0–3.0)

## 2024-02-20 NOTE — Patient Instructions (Signed)
 Description   INR-2.6; Continue taking Warfarin 1 tablet daily except 1/2 tablet on Sundays.  Stay consistent with greens each week (3 times per week).   Repeat INR in 4 weeks.  Coumadin  Clinic [phone]

## 2024-02-20 NOTE — Progress Notes (Signed)
 Description   INR-2.6; Continue taking Warfarin 1 tablet daily except 1/2 tablet on Sundays.  Stay consistent with greens each week (3 times per week).   Repeat INR in 4 weeks.  Coumadin  Clinic [phone]

## 2024-03-12 ENCOUNTER — Other Ambulatory Visit: Payer: Self-pay | Admitting: Family Medicine

## 2024-03-12 DIAGNOSIS — F411 Generalized anxiety disorder: Secondary | ICD-10-CM

## 2024-03-19 ENCOUNTER — Ambulatory Visit: Attending: Cardiology | Admitting: *Deleted

## 2024-03-19 DIAGNOSIS — Z9889 Other specified postprocedural states: Secondary | ICD-10-CM | POA: Insufficient documentation

## 2024-03-19 DIAGNOSIS — Z952 Presence of prosthetic heart valve: Secondary | ICD-10-CM | POA: Diagnosis present

## 2024-03-19 DIAGNOSIS — Z7901 Long term (current) use of anticoagulants: Secondary | ICD-10-CM | POA: Diagnosis present

## 2024-03-19 LAB — POCT INR: INR: 3.9 — AB (ref 2.0–3.0)

## 2024-03-19 NOTE — Progress Notes (Signed)
 Description   INR-3.9; Do not take any warfarin today then continue taking Warfarin 1 tablet daily except 1/2 tablet on Sundays.  Stay consistent with greens each week (3 times per week).   Repeat INR in 4 weeks.  Coumadin  Clinic [phone]

## 2024-03-19 NOTE — Patient Instructions (Signed)
 Description   INR-3.9; Do not take any warfarin today then continue taking Warfarin 1 tablet daily except 1/2 tablet on Sundays.  Stay consistent with greens each week (3 times per week).   Repeat INR in 4 weeks.  Coumadin  Clinic [phone]

## 2024-03-20 ENCOUNTER — Other Ambulatory Visit: Payer: Self-pay | Admitting: Family Medicine

## 2024-03-20 DIAGNOSIS — F411 Generalized anxiety disorder: Secondary | ICD-10-CM

## 2024-03-26 ENCOUNTER — Other Ambulatory Visit: Payer: Self-pay | Admitting: Pharmacist

## 2024-03-26 DIAGNOSIS — Z9889 Other specified postprocedural states: Secondary | ICD-10-CM

## 2024-03-26 MED ORDER — WARFARIN SODIUM 10 MG PO TABS: ORAL_TABLET | ORAL | 1 refills | Status: AC

## 2024-04-16 ENCOUNTER — Ambulatory Visit
